# Patient Record
Sex: Male | Born: 1945 | Race: White | Hispanic: No | Marital: Married | Smoking: Former smoker
Health system: Southern US, Community
[De-identification: ages and names within clinical notes are randomized; demographics above are authoritative.]

## PROBLEM LIST (undated history)

## (undated) DIAGNOSIS — D126 Benign neoplasm of colon, unspecified: Secondary | ICD-10-CM

## (undated) DIAGNOSIS — I5022 Chronic systolic (congestive) heart failure: Secondary | ICD-10-CM

## (undated) DIAGNOSIS — B159 Hepatitis A without hepatic coma: Secondary | ICD-10-CM

## (undated) DIAGNOSIS — I472 Ventricular tachycardia, unspecified: Secondary | ICD-10-CM

## (undated) DIAGNOSIS — I714 Abdominal aortic aneurysm, without rupture, unspecified: Secondary | ICD-10-CM

## (undated) DIAGNOSIS — I6522 Occlusion and stenosis of left carotid artery: Secondary | ICD-10-CM

## (undated) DIAGNOSIS — K259 Gastric ulcer, unspecified as acute or chronic, without hemorrhage or perforation: Secondary | ICD-10-CM

## (undated) DIAGNOSIS — I701 Atherosclerosis of renal artery: Secondary | ICD-10-CM

## (undated) DIAGNOSIS — I2510 Atherosclerotic heart disease of native coronary artery without angina pectoris: Secondary | ICD-10-CM

## (undated) DIAGNOSIS — I10 Essential (primary) hypertension: Secondary | ICD-10-CM

## (undated) DIAGNOSIS — I219 Acute myocardial infarction, unspecified: Secondary | ICD-10-CM

## (undated) DIAGNOSIS — E785 Hyperlipidemia, unspecified: Secondary | ICD-10-CM

## (undated) DIAGNOSIS — J4489 Other specified chronic obstructive pulmonary disease: Secondary | ICD-10-CM

## (undated) DIAGNOSIS — J449 Chronic obstructive pulmonary disease, unspecified: Secondary | ICD-10-CM

## (undated) DIAGNOSIS — I509 Heart failure, unspecified: Secondary | ICD-10-CM

## (undated) DIAGNOSIS — I77810 Thoracic aortic ectasia: Secondary | ICD-10-CM

## (undated) HISTORY — DX: Atherosclerotic heart disease of native coronary artery without angina pectoris: I25.10

## (undated) HISTORY — DX: Gastric ulcer, unspecified as acute or chronic, without hemorrhage or perforation: K25.9

## (undated) HISTORY — DX: Occlusion and stenosis of left carotid artery: I65.22

## (undated) HISTORY — DX: Chronic obstructive pulmonary disease, unspecified: J44.9

## (undated) HISTORY — DX: Essential (primary) hypertension: I10

## (undated) HISTORY — DX: Acute myocardial infarction, unspecified: I21.9

## (undated) HISTORY — DX: Hyperlipidemia, unspecified: E78.5

## (undated) HISTORY — DX: Thoracic aortic ectasia: I77.810

## (undated) HISTORY — PX: ABDOMINAL AORTIC ANEURYSM REPAIR: SUR1152

## (undated) HISTORY — DX: Chronic systolic (congestive) heart failure: I50.22

## (undated) HISTORY — PX: ANGIOPLASTY: SHX39

## (undated) HISTORY — DX: Hepatitis a without hepatic coma: B15.9

## (undated) HISTORY — PX: TONSILLECTOMY AND ADENOIDECTOMY: SUR1326

## (undated) HISTORY — DX: Benign neoplasm of colon, unspecified: D12.6

## (undated) HISTORY — DX: Other specified chronic obstructive pulmonary disease: J44.89

## (undated) HISTORY — DX: Abdominal aortic aneurysm, without rupture: I71.4

## (undated) HISTORY — DX: Abdominal aortic aneurysm, without rupture, unspecified: I71.40

---

## 1994-10-05 HISTORY — PX: CORONARY ANGIOPLASTY: SHX604

## 2009-10-05 HISTORY — PX: CARDIAC CATHETERIZATION: SHX172

## 2009-10-14 ENCOUNTER — Ambulatory Visit: Payer: Self-pay | Admitting: Gastroenterology

## 2010-01-30 ENCOUNTER — Ambulatory Visit: Payer: Self-pay | Admitting: Cardiology

## 2012-07-08 DIAGNOSIS — R739 Hyperglycemia, unspecified: Secondary | ICD-10-CM | POA: Insufficient documentation

## 2012-07-09 DIAGNOSIS — I472 Ventricular tachycardia: Secondary | ICD-10-CM | POA: Insufficient documentation

## 2012-07-09 DIAGNOSIS — I4729 Other ventricular tachycardia: Secondary | ICD-10-CM | POA: Insufficient documentation

## 2012-07-09 DIAGNOSIS — I214 Non-ST elevation (NSTEMI) myocardial infarction: Secondary | ICD-10-CM | POA: Insufficient documentation

## 2012-07-14 ENCOUNTER — Encounter: Payer: Self-pay | Admitting: *Deleted

## 2012-07-19 ENCOUNTER — Ambulatory Visit (INDEPENDENT_AMBULATORY_CARE_PROVIDER_SITE_OTHER): Payer: BC Managed Care – PPO | Admitting: Cardiovascular Disease

## 2012-07-19 ENCOUNTER — Encounter: Payer: Self-pay | Admitting: Cardiovascular Disease

## 2012-07-19 VITALS — BP 98/70 | HR 76 | Ht 71.0 in | Wt 268.2 lb

## 2012-07-19 DIAGNOSIS — R0600 Dyspnea, unspecified: Secondary | ICD-10-CM

## 2012-07-19 DIAGNOSIS — I2510 Atherosclerotic heart disease of native coronary artery without angina pectoris: Secondary | ICD-10-CM

## 2012-07-19 DIAGNOSIS — R0789 Other chest pain: Secondary | ICD-10-CM

## 2012-07-19 DIAGNOSIS — I714 Abdominal aortic aneurysm, without rupture, unspecified: Secondary | ICD-10-CM

## 2012-07-19 DIAGNOSIS — R0609 Other forms of dyspnea: Secondary | ICD-10-CM

## 2012-07-19 DIAGNOSIS — E785 Hyperlipidemia, unspecified: Secondary | ICD-10-CM

## 2012-07-19 MED ORDER — CLOPIDOGREL BISULFATE 75 MG PO TABS: 75.0000 mg | ORAL_TABLET | Freq: Every day | ORAL | Status: DC

## 2012-07-19 MED ORDER — SPIRONOLACTONE 25 MG PO TABS: 25.0000 mg | ORAL_TABLET | Freq: Every day | ORAL | Status: DC

## 2012-07-19 MED ORDER — ROSUVASTATIN CALCIUM 5 MG PO TABS: 5.0000 mg | ORAL_TABLET | Freq: Every day | ORAL | Status: DC

## 2012-07-19 NOTE — Patient Instructions (Addendum)
Your physician has requested that you have an echocardiogram. Echocardiography is a painless test that uses sound waves to create images of your heart. It provides your doctor with information about the size and shape of your heart and how well your heart's chambers and valves are working. This procedure takes approximately one hour. There are no restrictions for this procedure.  Start Aldactone 25 mg once daily.  Start Plavix 75 mg once daily.  Start Crestor 5 mg once daily.  Follow up after Echo.  Stay off work until further evaluation.

## 2012-07-25 ENCOUNTER — Encounter: Payer: Self-pay | Admitting: Cardiovascular Disease

## 2012-07-25 ENCOUNTER — Other Ambulatory Visit: Payer: Self-pay

## 2012-07-25 ENCOUNTER — Other Ambulatory Visit (INDEPENDENT_AMBULATORY_CARE_PROVIDER_SITE_OTHER): Payer: BC Managed Care – PPO

## 2012-07-25 DIAGNOSIS — I714 Abdominal aortic aneurysm, without rupture, unspecified: Secondary | ICD-10-CM | POA: Insufficient documentation

## 2012-07-25 DIAGNOSIS — I2510 Atherosclerotic heart disease of native coronary artery without angina pectoris: Secondary | ICD-10-CM | POA: Insufficient documentation

## 2012-07-25 DIAGNOSIS — R0609 Other forms of dyspnea: Secondary | ICD-10-CM

## 2012-07-25 DIAGNOSIS — R0600 Dyspnea, unspecified: Secondary | ICD-10-CM

## 2012-07-25 DIAGNOSIS — E785 Hyperlipidemia, unspecified: Secondary | ICD-10-CM | POA: Insufficient documentation

## 2012-07-25 DIAGNOSIS — R0989 Other specified symptoms and signs involving the circulatory and respiratory systems: Secondary | ICD-10-CM

## 2012-07-25 NOTE — Assessment & Plan Note (Signed)
The patient reports previous intolerance to multiple statins. He is willing to try these medications again given his recent MI. Thus, I will try with small dose Crestor 5 mg once daily.

## 2012-07-25 NOTE — Progress Notes (Signed)
HPI  Garrett Hunter is a pleasant 66 year old male who is here today to establish cardiovascular care. He has known history of coronary artery disease status post inferior myocardial infarction in 1996. He was treated with TPA at that time and was treated at [facility] as well with unknown details. He underwent cardiac catheterization in 2011 by Dr. Lady Gary which showed chronically occluded proximal RCA and proximal left circumflex with good collaterals. There was no obstructive disease in the LAD. He was treated medically. He has prolonged history of hyperlipidemia with intolerance to multiple statins due to myalgia. He was on vacation recently in [place]. One day after his arrival, on October 4, he started having substernal chest pain similar to his prior myocardial infarction. He was hospitalized there with non-ST elevation myocardial infarction. Peak troponin was 48 with significant anterior and anterolateral ST depression without ST elevation. He underwent cardiac catheterization by Dr. Gerri Spore, [place]. I reviewed the report and the images of the catheterization. It showed chronic occlusion in the RCA and left circumflex which was not known to them. There was plaque rupture in the proximal ramus was thrombus formation. An angioplasty was attempted to the left circumflex but as expected was unsuccessful. He then underwent successful balloon angioplasty of the proximal ramus without complications. Ejection fraction was noted to be 30% with akinesis of the basal inferior wall with possible aneurysm formation. The patient did well overall and was discharged home. Since hospital discharge, and reasonably well. He has not had recurrent chest pain or shortness of breath. I spent at least 40 minutes reviewing his previous cardiac records as well as cardiac catheterization.  Allergies  Allergen Reactions  . Crestor (Rosuvastatin)   . Livalo (Pitavastatin)     myalgia  . Pravachol (Pravastatin Sodium)   . Zocor  (Simvastatin)      Current Outpatient Prescriptions on File Prior to Visit  Medication Sig Dispense Refill  . aspirin 81 MG tablet Take 81 mg by mouth daily.      . carvedilol (COREG) 12.5 MG tablet Take 12.5 mg by mouth 2 (two) times daily.      . folic acid (FOLVITE) 400 MCG tablet Takes 2 tablets daily.      . rosuvastatin (CRESTOR) 5 MG tablet Take 1 tablet (5 mg total) by mouth daily.  90 tablet  3  . spironolactone (ALDACTONE) 25 MG tablet Take 1 tablet (25 mg total) by mouth daily.  30 tablet  6     Past Medical History  Diagnosis Date  . MI (myocardial infarction)   . Hypertension   . Hepatitis A   . Chronic airway obstruction, not elsewhere classified   . Benign neoplasm of colon   . Coronary artery disease     Inferior MI in 1996 treated with TPA. Cardiac cath in 2011 at [facility] showed chronic occlusion of the proximal RCA and proximal left circumflex without obstructive disease in the LAD. Non-ST elevation myocardial infarction in October 2013 while on vacation in [place]. Cardiac catheterization showed plaque rupture in the proximal ramus with thrombus. He had balloon angioplasty done.   . Coronary atherosclerosis of native coronary artery   . Hyperlipidemia     Intolerance to statins.  [name] AAA (abdominal aortic aneurysm)     Small noted during cardiac catheterization in 2011.     Past Surgical History  Procedure Date  . Tonsillectomy and adenoidectomy   . Angioplasty   . Cardiac catheterization 2011  . Coronary angioplasty 1996  [facility] x1     Family History  Problem Relation Age of Onset  . Heart attack Father      History   Social History  . Marital Status: Married    Spouse Name: N/A    Number of Children: N/A  . Years of Education: N/A   Occupational History  . Not on file.   Social History Main Topics  . Smoking status: Former Smoker -- 2.0 packs/day for 40 years    Types: Cigarettes  . Smokeless tobacco: Not on file  . Alcohol Use: Yes      occassionally  . Drug Use: No  . Sexually Active:    Other Topics Concern  . Not on file   Social History Narrative  . No narrative on file     ROS Constitutional: Negative for fever, chills, diaphoresis, activity change, appetite change and fatigue.  HENT: Negative for hearing loss, nosebleeds, congestion, sore throat, facial swelling, drooling, trouble swallowing, neck pain, voice change, sinus pressure and tinnitus.  Eyes: Negative for photophobia, pain, discharge and visual disturbance.  Respiratory: Negative for apnea, cough, chest tightness, shortness of breath and wheezing.  Cardiovascular: Negative for chest pain, palpitations and leg swelling.  Gastrointestinal: Negative for nausea, vomiting, abdominal pain, diarrhea, constipation, blood in stool and abdominal distention.  Genitourinary: Negative for dysuria, urgency, frequency, hematuria and decreased urine volume.  Musculoskeletal: Negative for myalgias, back pain, joint swelling, arthralgias and gait problem.  Skin: Negative for color change, pallor, rash and wound.  Neurological: Negative for dizziness, tremors, seizures, syncope, speech difficulty, weakness, light-headedness, numbness and headaches.  Psychiatric/Behavioral: Negative for suicidal ideas, hallucinations, behavioral problems and agitation. The patient is not nervous/anxious.     PHYSICAL EXAM   BP 98/70  Pulse 76  Ht 5\' 11"  (1.803 m)  Wt 268 lb 4 oz (121.677 kg)  BMI 37.41 kg/m2 Constitutional: He is oriented to person, place, and time. He appears well-developed and well-nourished. No distress.  HENT: No nasal discharge.  Head: Normocephalic and atraumatic.  Eyes: Pupils are equal and round. Right eye exhibits no discharge. Left eye exhibits no discharge.  Neck: Normal range of motion. Neck supple. No JVD present. No thyromegaly present.  Cardiovascular: Normal rate, regular rhythm, normal heart sounds and. Exam reveals no gallop and no friction rub.  No murmur heard.  Pulmonary/Chest: Effort normal and breath sounds normal. No stridor. No respiratory distress. He has no wheezes. He has no rales. He exhibits no tenderness.  Abdominal: Soft. Bowel sounds are normal. He exhibits no distension. There is no tenderness. There is no rebound and no guarding.  Musculoskeletal: Normal range of motion. He exhibits no edema and no tenderness.  Neurological: He is alert and oriented to person, place, and time. Coordination normal.  Skin: Skin is warm and dry. No rash noted. He is not diaphoretic. No erythema. No pallor.  Psychiatric: He has a normal mood and affect. His behavior is normal. Judgment and thought content normal.       EKG: Sinus  Rhythm  -Nonspecific QRS widening.   -  T-abnormality  -Possible lateral ischemia.   ABNORMAL    ASSESSMENT AND PLAN

## 2012-07-25 NOTE — Assessment & Plan Note (Signed)
This was mentioned on cardiac catheterization in 2011. He will need a followup abdominal duplex ultrasound.

## 2012-07-25 NOTE — Assessment & Plan Note (Signed)
The patient is doing reasonably well after her recent non-ST elevation myocardial infarction with angioplasty of the proximal ramus without stent placement. He is known to have chronic occlusion in the RCA as well as left circumflex. I reviewed his recent cardiac catheterization. There is no obstructive disease in the left anterior descending artery. The inferior wall segment is likely nonviable. He has reasonable collaterals to the left circumflex and RCA distribution and these have been known to be occluded since at least 2011. Thus, is likely not much benefit of revascularization in these territories. I Am also interested to see what his current ejection fraction is. Thus, I will request an echocardiogram. I asked him to start taking Plavix 75 mg once daily and this was prescribed to him. Also due to his reduced ejection fraction and recent myocardial infarction, I agree with spironolactone 25 mg once daily. I will check basic metabolic profile during his followup visit.

## 2012-07-26 ENCOUNTER — Other Ambulatory Visit: Payer: Self-pay

## 2012-07-26 DIAGNOSIS — I714 Abdominal aortic aneurysm, without rupture, unspecified: Secondary | ICD-10-CM

## 2012-07-26 MED ORDER — ROSUVASTATIN CALCIUM 5 MG PO TABS: 5.0000 mg | ORAL_TABLET | Freq: Every day | ORAL | Status: DC

## 2012-07-28 ENCOUNTER — Telehealth: Payer: Self-pay

## 2012-07-28 ENCOUNTER — Encounter (INDEPENDENT_AMBULATORY_CARE_PROVIDER_SITE_OTHER): Payer: BC Managed Care – PPO

## 2012-07-28 DIAGNOSIS — I714 Abdominal aortic aneurysm, without rupture, unspecified: Secondary | ICD-10-CM

## 2012-07-28 NOTE — Telephone Encounter (Signed)
Rec'd t/c from pharmacy stating insur company is denying Crestor 5 mg, wants MD to first try generic statin. I told pharmacist we would supply pt with samples of Crestor 5 until Dr. Kirke Corin tells [place] what to prescribe. Understanding verb. Pt will pick up samples tomm.

## 2012-07-29 NOTE — Telephone Encounter (Signed)
He already tried multiple generics and had too many side effects. I can discuss this with him during follow up.

## 2012-08-01 NOTE — Telephone Encounter (Signed)
Has f/u 10/31

## 2012-08-03 ENCOUNTER — Encounter: Payer: Self-pay | Admitting: Cardiovascular Disease

## 2012-08-04 ENCOUNTER — Telehealth: Payer: Self-pay

## 2012-08-04 ENCOUNTER — Encounter: Payer: Self-pay | Admitting: Cardiovascular Disease

## 2012-08-04 ENCOUNTER — Ambulatory Visit (INDEPENDENT_AMBULATORY_CARE_PROVIDER_SITE_OTHER): Payer: BC Managed Care – PPO | Admitting: Cardiovascular Disease

## 2012-08-04 VITALS — BP 102/70 | HR 80 | Ht 71.0 in | Wt 219.0 lb

## 2012-08-04 DIAGNOSIS — I5022 Chronic systolic (congestive) heart failure: Secondary | ICD-10-CM | POA: Insufficient documentation

## 2012-08-04 DIAGNOSIS — I2510 Atherosclerotic heart disease of native coronary artery without angina pectoris: Secondary | ICD-10-CM

## 2012-08-04 DIAGNOSIS — I714 Abdominal aortic aneurysm, without rupture, unspecified: Secondary | ICD-10-CM

## 2012-08-04 DIAGNOSIS — E785 Hyperlipidemia, unspecified: Secondary | ICD-10-CM

## 2012-08-04 MED ORDER — ROSUVASTATIN CALCIUM 5 MG PO TABS: 5.0000 mg | ORAL_TABLET | Freq: Every day | ORAL | Status: DC

## 2012-08-04 NOTE — Progress Notes (Signed)
HPI  [name] is a pleasant 66 year old male who is here today for a followup visit.  He has known history of coronary artery disease status post inferior myocardial infarction in 1996. He was treated with TPA at that time and was treated at [facility] as well with unknown details. He underwent cardiac catheterization in 2011 by Dr. Lady Hunter which showed chronically occluded proximal RCA and proximal left circumflex with good collaterals. There was no obstructive disease in the LAD. He was treated medically. He has prolonged history of hyperlipidemia with intolerance to multiple statins due to myalgia. He was on vacation recently in [place]. One day after his arrival, on October 4, he started having substernal chest pain similar to his prior myocardial infarction. He was hospitalized there with non-ST elevation myocardial infarction. Peak troponin was 48 with significant anterior and anterolateral ST depression without ST elevation. He underwent cardiac catheterization by Dr. Gerri Hunter, [place]. I reviewed the report and the images of the catheterization. It showed chronic occlusion in the RCA and left circumflex which was not known to them. There was plaque rupture in the proximal ramus was thrombus formation. An angioplasty was attempted to the left circumflex but as expected was unsuccessful. He then underwent successful balloon angioplasty of the proximal ramus without complications. Ejection fraction was noted to be 30% with akinesis of the basal inferior wall with possible aneurysm formation. The patient did well overall and was discharged home. Since hospital discharge, and reasonably well. He has not had recurrent chest pain or shortness of breath. During last visit, I started him on Plavix 75 mg once daily as well as spironolactone 25 mg once daily. He underwent an echocardiogram which showed an ejection fraction of 40-45%. 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 Overall, he is doing very well at this time. He denies any chest pain or dyspnea. He complains of occasional dizziness especially when he stands up.  Allergies  Allergen Reactions  . Crestor (Rosuvastatin)   . Livalo (Pitavastatin)     myalgia  . Pravachol (Pravastatin Sodium)   . Zocor (Simvastatin)      Current Outpatient Prescriptions on File Prior to Visit  Medication Sig Dispense Refill  . aspirin 81 MG tablet Take 81 mg by mouth daily.      . carvedilol (COREG) 12.5 MG tablet Take 12.5 mg by mouth 2 (two) times daily.      . clopidogrel (PLAVIX) 75 MG tablet Take 1 tablet (75 mg total) by mouth daily.  30 tablet  6  . folic acid (FOLVITE) 400 MCG tablet Takes 2 tablets daily.      [name] lisinopril (PRINIVIL,ZESTRIL) 10 MG tablet Take 10 mg by mouth daily.      . rosuvastatin (CRESTOR) 5 MG tablet Take 1 tablet (5 mg total) by mouth daily.  90 tablet  3  . spironolactone (ALDACTONE) 25 MG tablet Take 1 tablet (25 mg total) by mouth daily.  30 tablet  6     Past Medical History  Diagnosis Date  . MI (myocardial infarction)   . Hypertension   . Hepatitis A   . Chronic airway obstruction, not elsewhere classified   . Benign neoplasm of colon   . Coronary artery disease     Inferior MI in 1996 treated with TPA. Cardiac cath in 2011 at [facility] showed chronic occlusion of the proximal RCA and  proximal left circumflex without obstructive disease in the LAD. Non-ST elevation myocardial infarction in October 2013 while on vacation in [place]. Cardiac catheterization showed plaque rupture in the proximal ramus with thrombus. He had balloon angioplasty done.   . Coronary atherosclerosis of native coronary artery   . Hyperlipidemia     Intolerance to statins.  [name] AAA (abdominal aortic aneurysm)     Small noted during cardiac catheterization in 2011.     Past Surgical History  Procedure Date  . Tonsillectomy and  adenoidectomy   . Angioplasty   . Cardiac catheterization 2011  . Coronary angioplasty 1996    [facility] x1     Family History  Problem Relation Age of Onset  . Heart attack Father      History   Social History  . Marital Status: Married    Spouse Name: N/A    Number of Children: N/A  . Years of Education: N/A   Occupational History  . Not on file.   Social History Main Topics  . Smoking status: Former Smoker -- 2.0 packs/day for 40 years    Types: Cigarettes  . Smokeless tobacco: Not on file  . Alcohol Use: Yes     occassionally  . Drug Use: No  . Sexually Active:    Other Topics Concern  . Not on file   Social History Narrative  . No narrative on file      PHYSICAL EXAM   BP 102/70  Pulse 80  Ht 5\' 11"  (1.803 m)  Wt 219 lb (99.338 kg)  BMI 30.54 kg/m2 Constitutional: He is oriented to person, place, and time. He appears well-developed and well-nourished. No distress.  HENT: No nasal discharge.  Head: Normocephalic and atraumatic.  Eyes: Pupils are equal and round. Right eye exhibits no discharge. Left eye exhibits no discharge.  Neck: Normal range of motion. Neck supple. No JVD present. No thyromegaly present.  Cardiovascular: Normal rate, regular rhythm, normal heart sounds and. Exam reveals no gallop and no friction rub. No murmur heard.  Pulmonary/Chest: Effort normal and breath sounds normal. No stridor. No respiratory distress. He has no wheezes. He has no rales. He exhibits no tenderness.  Abdominal: Soft. Bowel sounds are normal. He exhibits no distension. There is no tenderness. There is no rebound and no guarding.  Musculoskeletal: Normal range of motion. He exhibits no edema and no tenderness.  Neurological: He is alert and oriented to person, place, and time. Coordination normal.  Skin: Skin is warm and dry. No rash noted. He is not diaphoretic. No erythema. No pallor.  Psychiatric: He has a normal mood and affect. His behavior is normal. Judgment  and thought content normal.        ASSESSMENT AND PLAN

## 2012-08-04 NOTE — Assessment & Plan Note (Addendum)
The patient seems to be doing well at this time with no recurrent angina. He is known to have chronically occluded RCA and left circumflex. Ejection fraction was 40-45% by echocardiogram. The inferior wall was akinetic and likely nonviable. Given that he does not have obstructive disease in the LAD and likely nonviable inferior wall, the benefit of surgical revascularization is marginal at best. Thus, I recommend continuing aggressive medical therapy. He is to continue Plavix for at least one year but I will likely use this long-term if tolerated. He can resume work on November 5 without restrictions. I will also refer him to cardiac rehabilitation.

## 2012-08-04 NOTE — Assessment & Plan Note (Signed)
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 Currently New York Heart Association class II with no evidence of fluid overload. Continue medical therapy. Check basic metabolic profile today given that he was started on spironolactone recently. If he continues to complain of dizziness, I will consider decreasing the dose of Coreg or lisinopril.

## 2012-08-04 NOTE — Telephone Encounter (Signed)
Refill sent for crestor 5 mg take one tablet daily. The patient was notified that the crestor has been approved for 24 months starting 08/04/2012 per Ismael @ [phone].

## 2012-08-04 NOTE — Assessment & Plan Note (Signed)
Medium in size at 4.6 cm. I will plan on repeat aortic duplex ultrasound in 6 months to ensure stability in size.

## 2012-08-04 NOTE — Assessment & Plan Note (Signed)
The patient has known history of intolerance to multiple statins due to myalgia. He is currently tolerating Crestor 5 mg once daily. I will check fasting lipid and liver profile in 6 weeks.

## 2012-08-04 NOTE — Patient Instructions (Addendum)
Labs today (BMP) Continue same medications.  You can resume work without restrictions on 08/09/2012 Refer to cardiac rehab.  Obtain prior authorization for Crestor.  Fasting labs in 6 weeks.  Follow up in 3 months.

## 2012-08-05 ENCOUNTER — Encounter: Payer: Self-pay | Admitting: Cardiovascular Disease

## 2012-08-05 LAB — BASIC METABOLIC PANEL
BUN/Creatinine Ratio: 15 (ref 10–22)
BUN: 14 mg/dL (ref 8–27)
CO2: 24 mmol/L (ref 19–28)
Calcium: 10 mg/dL (ref 8.6–10.2)
Chloride: 100 mmol/L (ref 97–108)
Creatinine, Ser: 0.95 mg/dL (ref 0.76–1.27)
GFR calc Af Amer: 97 mL/min/{1.73_m2} (ref >59)
GFR calc non Af Amer: 84 mL/min/{1.73_m2} (ref >59)
Glucose: 73 mg/dL (ref 65–99)
Potassium: 5.1 mmol/L (ref 3.5–5.2)
Sodium: 141 mmol/L (ref 134–144)

## 2012-08-10 ENCOUNTER — Encounter: Payer: Self-pay | Admitting: Cardiovascular Disease

## 2012-09-13 ENCOUNTER — Ambulatory Visit (INDEPENDENT_AMBULATORY_CARE_PROVIDER_SITE_OTHER): Payer: BC Managed Care – PPO

## 2012-09-13 DIAGNOSIS — E785 Hyperlipidemia, unspecified: Secondary | ICD-10-CM

## 2012-09-14 LAB — HEPATIC FUNCTION PANEL
ALT: 26 IU/L (ref 0–44)
AST: 24 IU/L (ref 0–40)
Albumin: 4.5 g/dL (ref 3.6–4.8)
Alkaline Phosphatase: 83 IU/L (ref 39–117)
Bilirubin, Direct: 0.11 mg/dL (ref 0.00–0.40)
Total Bilirubin: 0.3 mg/dL (ref 0.0–1.2)
Total Protein: 6.4 g/dL (ref 6.0–8.5)

## 2012-09-14 LAB — LIPID PANEL
Chol/HDL Ratio: 3.3 ratio units (ref 0.0–5.0)
Cholesterol, Total: 121 mg/dL (ref 100–199)
HDL: 37 mg/dL — ABNORMAL LOW (ref >39)
LDL Calculated: 68 mg/dL (ref 0–99)
Triglycerides: 78 mg/dL (ref 0–149)
VLDL Cholesterol Cal: 16 mg/dL (ref 5–40)

## 2012-11-05 HISTORY — PX: CORONARY ANGIOPLASTY: SHX604

## 2012-11-05 HISTORY — PX: CARDIAC CATHETERIZATION: SHX172

## 2012-11-07 ENCOUNTER — Encounter: Payer: Self-pay | Admitting: Cardiovascular Disease

## 2012-11-07 ENCOUNTER — Ambulatory Visit (INDEPENDENT_AMBULATORY_CARE_PROVIDER_SITE_OTHER): Payer: Commercial Indemnity | Admitting: Cardiovascular Disease

## 2012-11-07 VITALS — BP 110/72 | HR 73 | Ht 71.0 in | Wt 225.8 lb

## 2012-11-07 DIAGNOSIS — I5022 Chronic systolic (congestive) heart failure: Secondary | ICD-10-CM

## 2012-11-07 DIAGNOSIS — R0789 Other chest pain: Secondary | ICD-10-CM

## 2012-11-07 DIAGNOSIS — I2510 Atherosclerotic heart disease of native coronary artery without angina pectoris: Secondary | ICD-10-CM

## 2012-11-07 DIAGNOSIS — I714 Abdominal aortic aneurysm, without rupture, unspecified: Secondary | ICD-10-CM

## 2012-11-07 DIAGNOSIS — E785 Hyperlipidemia, unspecified: Secondary | ICD-10-CM

## 2012-11-07 MED ORDER — NITROGLYCERIN 0.4 MG SL SUBL: 0.4000 mg | SUBLINGUAL_TABLET | SUBLINGUAL | Status: DC | PRN

## 2012-11-07 NOTE — Progress Notes (Signed)
HPI  [name] is a pleasant 67 year old male who is here today for a followup visit.  He has known history of coronary artery disease status post inferior myocardial infarction in 1996. He was treated with TPA at that time . He underwent cardiac catheterization in 2011 by Dr. Lady Gary which showed chronically occluded proximal RCA and proximal left circumflex with good collaterals. There was no obstructive disease in the LAD.  He was on vacation in [place] last October and was hospitalized there with non-ST elevation myocardial infarction. Peak troponin was 48 with significant anterior and anterolateral ST depression without ST elevation. He underwent cardiac catheterization by Dr. Gerri Spore, [place]. I reviewed the report and the images of the catheterization. It showed chronic occlusion in the RCA and left circumflex which was not known to them. There was plaque rupture in the proximal ramus was thrombus formation. An angioplasty was attempted to the left circumflex but as expected was unsuccessful. He then underwent successful balloon angioplasty of the proximal ramus without complications. Ejection fraction was noted to be 30% with akinesis of the basal inferior wall with possible aneurysm formation.  I started him on Plavix 75 mg once daily as well as spironolactone 25 mg once daily. He underwent an echocardiogram which showed an ejection fraction of 40-45%. 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 He did have a few recent episodes of substernal discomfort with overexertion. He felt tired.  Allergies  Allergen Reactions  . Crestor (Rosuvastatin)   . Livalo (Pitavastatin)     myalgia  . Pravachol (Pravastatin Sodium)   . Zocor (Simvastatin)      Current Outpatient Prescriptions on File Prior to Visit  Medication Sig Dispense Refill  . aspirin 81 MG tablet Take 81 mg by  mouth daily.      . carvedilol (COREG) 12.5 MG tablet Take 12.5 mg by mouth 2 (two) times daily.      . clopidogrel (PLAVIX) 75 MG tablet Take 1 tablet (75 mg total) by mouth daily.  30 tablet  6  . folic acid (FOLVITE) 400 MCG tablet Takes 2 tablets daily.      [name] lisinopril (PRINIVIL,ZESTRIL) 10 MG tablet Take 10 mg by mouth daily.      . Multiple Vitamin (MULTIVITAMIN) tablet Take 1 tablet by mouth daily.      . rosuvastatin (CRESTOR) 5 MG tablet Take 1 tablet (5 mg total) by mouth daily.  90 tablet  3  . spironolactone (ALDACTONE) 25 MG tablet Take 1 tablet (25 mg total) by mouth daily.  30 tablet  6  . nitroGLYCERIN (NITROSTAT) 0.4 MG SL tablet Place 1 tablet (0.4 mg total) under the tongue every 5 (five) minutes as needed for chest pain.  25 tablet  6     Past Medical History  Diagnosis Date  . MI (myocardial infarction)   . Hypertension   . Hepatitis A   . Chronic airway obstruction, not elsewhere classified   . Benign neoplasm of colon   . Coronary artery disease     Inferior MI in 1996 treated with TPA. Cardiac cath in 2011 at [facility] showed chronic occlusion of the proximal RCA and proximal left circumflex without obstructive disease in the LAD. Non-ST elevation myocardial infarction in October 2013 while on vacation in [place]. Cardiac catheterization showed plaque rupture in the proximal ramus with thrombus. He had balloon angioplasty  done.   . Coronary atherosclerosis of native coronary artery   . Hyperlipidemia     Intolerance to statins.  [name] AAA (abdominal aortic aneurysm)     Small noted during cardiac catheterization in 2011.     Past Surgical History  Procedure Date  . Tonsillectomy and adenoidectomy   . Angioplasty   . Cardiac catheterization 2011  . Coronary angioplasty 1996    [facility] x1     Family History  Problem Relation Age of Onset  . Heart attack Father      History   Social History  . Marital Status: Married    Spouse Name: N/A    Number of  Children: N/A  . Years of Education: N/A   Occupational History  . Not on file.   Social History Main Topics  . Smoking status: Former Smoker -- 2.0 packs/day for 40 years    Types: Cigarettes  . Smokeless tobacco: Not on file  . Alcohol Use: Yes     Comment: occassionally  . Drug Use: No  . Sexually Active:    Other Topics Concern  . Not on file   Social History Narrative  . No narrative on file      PHYSICAL EXAM   BP 110/72  Pulse 73  Ht 5\' 11"  (1.803 m)  Wt 225 lb 12 oz (102.4 kg)  BMI 31.49 kg/m2 Constitutional: He is oriented to person, place, and time. He appears well-developed and well-nourished. No distress.  HENT: No nasal discharge.  Head: Normocephalic and atraumatic.  Eyes: Pupils are equal and round. Right eye exhibits no discharge. Left eye exhibits no discharge.  Neck: Normal range of motion. Neck supple. No JVD present. No thyromegaly present.  Cardiovascular: Normal rate, regular rhythm, normal heart sounds and. Exam reveals no gallop and no friction rub. No murmur heard.  Pulmonary/Chest: Effort normal and breath sounds normal. No stridor. No respiratory distress. He has no wheezes. He has no rales. He exhibits no tenderness.  Abdominal: Soft. Bowel sounds are normal. He exhibits no distension. There is no tenderness. There is no rebound and no guarding.  Musculoskeletal: Normal range of motion. He exhibits no edema and no tenderness.  Neurological: He is alert and oriented to person, place, and time. Coordination normal.  Skin: Skin is warm and dry. No rash noted. He is not diaphoretic. No erythema. No pallor.  Psychiatric: He has a normal mood and affect. His behavior is normal. Judgment and thought content normal.     [id number] sinus rhythm Nonspecific IVCD Nonspecific ST changes in the inferior leads. Lateral T wave changes suggestive of ischemia.   ASSESSMENT AND PLAN

## 2012-11-07 NOTE — Assessment & Plan Note (Signed)
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 Currently New York Heart Association class II with no evidence of fluid overload. Continue medical therapy. Continue current medications.

## 2012-11-07 NOTE — Assessment & Plan Note (Signed)
He had recent dyspnea chest pain with overexertion. He is obviously at high risk for restenosis at the site of recent angioplasty given that no stent was placed. Thus, I recommend proceeding with a treadmill nuclear stress test for evaluation. A treadmill stress test alone is not sufficient given his abnormal baseline ECG.

## 2012-11-07 NOTE — Assessment & Plan Note (Signed)
I ordered a followup aortic duplex ultrasound to be done in May of this year.

## 2012-11-07 NOTE — Patient Instructions (Addendum)
Your physician has requested that you have en exercise stress myoview. For further information please visit [email or web address] Please follow instruction sheet, as given.  Hold Coreg the morning of stress test.   Schedule aortic aneurysm Duplex in May.   Follow up in 6 months.

## 2012-11-07 NOTE — Assessment & Plan Note (Signed)
Lab Results  Component Value Date   HDL 37* 09/13/2012   LDLCALC 68 09/13/2012   TRIG 78 09/13/2012   CHOLHDL 3.3 09/13/2012   Continue small dose Crestor which seems to be well tolerated.

## 2012-11-15 ENCOUNTER — Telehealth: Payer: Self-pay

## 2012-11-15 NOTE — Telephone Encounter (Signed)
lmtcb

## 2012-11-15 NOTE — Telephone Encounter (Signed)
"  Call pt to move stress test to next week d/t weather" VO Dr. Alvis Lemmings, RN

## 2012-11-16 NOTE — Telephone Encounter (Signed)
Verbal instructions given to pt re:new date and time for stress myoview R/s to 2/17 Understanding verb

## 2012-11-19 ENCOUNTER — Other Ambulatory Visit: Payer: Self-pay

## 2012-11-21 ENCOUNTER — Ambulatory Visit: Payer: Self-pay | Admitting: Cardiovascular Disease

## 2012-11-21 ENCOUNTER — Telehealth: Payer: Self-pay

## 2012-11-21 ENCOUNTER — Other Ambulatory Visit: Payer: Self-pay

## 2012-11-21 ENCOUNTER — Ambulatory Visit (INDEPENDENT_AMBULATORY_CARE_PROVIDER_SITE_OTHER): Payer: Commercial Indemnity

## 2012-11-21 DIAGNOSIS — R0609 Other forms of dyspnea: Secondary | ICD-10-CM

## 2012-11-21 DIAGNOSIS — R0600 Dyspnea, unspecified: Secondary | ICD-10-CM

## 2012-11-21 DIAGNOSIS — R079 Chest pain, unspecified: Secondary | ICD-10-CM

## 2012-11-21 NOTE — Telephone Encounter (Signed)
I spoke with wife who asks that I call husband on home phone and if I cannot speak with him, I can call her back with details

## 2012-11-21 NOTE — Telephone Encounter (Signed)
Will call pt to schedule

## 2012-11-21 NOTE — Telephone Encounter (Signed)
Verbal instructions given to pt's wife re: LHC and need for labs Understanding verb She will see if he can come in today for labs

## 2012-11-21 NOTE — Telephone Encounter (Signed)
Scheduled LHC for Thursday 11/24/12 at 0900 Will call pt with details

## 2012-11-21 NOTE — Telephone Encounter (Signed)
LMTCB on home phone.

## 2012-11-21 NOTE — Telephone Encounter (Signed)
Message copied by [facility], Kamauri Denardo E on Mon Nov 21, 2012  3:03 PM ------      Message from: Lorine Bears A      Created: Mon Nov 21, 2012  2:50 PM       His stress test was highly abnormal. I left him a message in that regard. Schedule him for cardiac cath at [facility] this week (Thursday is fine).  ------

## 2012-11-22 ENCOUNTER — Other Ambulatory Visit: Payer: Self-pay | Admitting: *Deleted

## 2012-11-22 DIAGNOSIS — R0600 Dyspnea, unspecified: Secondary | ICD-10-CM

## 2012-11-22 DIAGNOSIS — R0609 Other forms of dyspnea: Secondary | ICD-10-CM

## 2012-11-22 LAB — CBC WITH DIFFERENTIAL
Basophils Absolute: 0 10*3/uL (ref 0.0–0.2)
Basos: 0 % (ref 0–3)
Eos: 3 % (ref 0–5)
Eosinophils Absolute: 0.2 10*3/uL (ref 0.0–0.4)
HCT: 40.7 % (ref 37.5–51.0)
Hemoglobin: 13.7 g/dL (ref 12.6–17.7)
Immature Grans (Abs): 0 10*3/uL (ref 0.0–0.1)
Immature Granulocytes: 0 % (ref 0–2)
Lymphocytes Absolute: 2.2 10*3/uL (ref 0.7–3.1)
Lymphs: 26 % (ref 14–46)
MCH: 32.8 pg (ref 26.6–33.0)
MCHC: 33.7 g/dL (ref 31.5–35.7)
MCV: 97 fL (ref 79–97)
Monocytes Absolute: 0.6 10*3/uL (ref 0.1–0.9)
Monocytes: 7 % (ref 4–12)
Neutrophils Absolute: 5.5 10*3/uL (ref 1.4–7.0)
Neutrophils Relative %: 64 % (ref 40–74)
Platelets: 262 10*3/uL (ref 155–379)
RBC: 4.18 x10E6/uL (ref 4.14–5.80)
RDW: 13.3 % (ref 12.3–15.4)
WBC: 8.5 10*3/uL (ref 3.4–10.8)

## 2012-11-22 LAB — BASIC METABOLIC PANEL
BUN/Creatinine Ratio: 18 (ref 10–22)
BUN: 17 mg/dL (ref 8–27)
CO2: 17 mmol/L — ABNORMAL LOW (ref 19–28)
Calcium: 8.8 mg/dL (ref 8.6–10.2)
Chloride: 103 mmol/L (ref 97–108)
Creatinine, Ser: 0.96 mg/dL (ref 0.76–1.27)
GFR calc Af Amer: 96 mL/min/{1.73_m2} (ref >59)
GFR calc non Af Amer: 83 mL/min/{1.73_m2} (ref >59)
Glucose: 120 mg/dL — ABNORMAL HIGH (ref 65–99)
Potassium: 4.6 mmol/L (ref 3.5–5.2)
Sodium: 143 mmol/L (ref 134–144)

## 2012-11-22 LAB — PROTIME-INR
INR: 1 (ref 0.8–1.2)
Prothrombin Time: 10.3 s (ref 9.1–12.0)

## 2012-11-23 ENCOUNTER — Other Ambulatory Visit: Payer: Self-pay | Admitting: *Deleted

## 2012-11-23 DIAGNOSIS — R0789 Other chest pain: Secondary | ICD-10-CM

## 2012-11-24 ENCOUNTER — Encounter: Payer: Self-pay | Admitting: Cardiovascular Disease

## 2012-11-24 ENCOUNTER — Ambulatory Visit: Payer: Self-pay | Admitting: Cardiovascular Disease

## 2012-11-24 DIAGNOSIS — I2510 Atherosclerotic heart disease of native coronary artery without angina pectoris: Secondary | ICD-10-CM

## 2012-11-24 LAB — CK TOTAL AND CKMB (NOT AT ARMC)
CK, Total: 99 U/L (ref 35–232)
CK-MB: 1 ng/mL (ref 0.5–3.6)

## 2012-11-25 DIAGNOSIS — I209 Angina pectoris, unspecified: Secondary | ICD-10-CM

## 2012-11-25 DIAGNOSIS — R9430 Abnormal result of cardiovascular function study, unspecified: Secondary | ICD-10-CM

## 2012-11-25 LAB — BASIC METABOLIC PANEL
Anion Gap: 6 — ABNORMAL LOW (ref 7–16)
BUN: 13 mg/dL (ref 7–18)
Calcium, Total: 9.1 mg/dL (ref 8.5–10.1)
Chloride: 106 mmol/L (ref 98–107)
Co2: 29 mmol/L (ref 21–32)
Creatinine: 0.92 mg/dL (ref 0.60–1.30)
EGFR (African American): >60
EGFR (Non-African Amer.): >60
Glucose: 99 mg/dL (ref 65–99)
Osmolality: 281 (ref 275–301)
Potassium: 4.6 mmol/L (ref 3.5–5.1)
Sodium: 141 mmol/L (ref 136–145)

## 2012-11-28 ENCOUNTER — Telehealth: Payer: Self-pay | Admitting: *Deleted

## 2012-11-28 ENCOUNTER — Encounter: Payer: Self-pay | Admitting: Cardiovascular Disease

## 2012-11-28 NOTE — Telephone Encounter (Signed)
Error

## 2012-12-05 ENCOUNTER — Encounter: Payer: Self-pay | Admitting: *Deleted

## 2012-12-12 ENCOUNTER — Ambulatory Visit (INDEPENDENT_AMBULATORY_CARE_PROVIDER_SITE_OTHER): Payer: Commercial Indemnity | Admitting: Cardiovascular Disease

## 2012-12-12 ENCOUNTER — Encounter: Payer: Self-pay | Admitting: Cardiovascular Disease

## 2012-12-12 VITALS — BP 110/60 | HR 82 | Ht 71.0 in | Wt 226.2 lb

## 2012-12-12 DIAGNOSIS — I714 Abdominal aortic aneurysm, without rupture, unspecified: Secondary | ICD-10-CM

## 2012-12-12 DIAGNOSIS — I5022 Chronic systolic (congestive) heart failure: Secondary | ICD-10-CM

## 2012-12-12 DIAGNOSIS — I2510 Atherosclerotic heart disease of native coronary artery without angina pectoris: Secondary | ICD-10-CM

## 2012-12-12 DIAGNOSIS — E785 Hyperlipidemia, unspecified: Secondary | ICD-10-CM

## 2012-12-12 MED ORDER — ASPIRIN EC 81 MG PO TBEC: 81.0000 mg | DELAYED_RELEASE_TABLET | Freq: Every day | ORAL | Status: DC

## 2012-12-12 NOTE — Patient Instructions (Addendum)
Add Aspirin 81 mg once daily.  Continue other medications.  Attend cardiac rehab.  Follow up in 6 months.

## 2012-12-12 NOTE — Assessment & Plan Note (Signed)
He is tolerating small dose Crestor. 

## 2012-12-12 NOTE — Assessment & Plan Note (Signed)
Followup the aortic duplex ultrasound is scheduled in May.

## 2012-12-12 NOTE — Assessment & Plan Note (Signed)
He appears to be euvolemic and overall in New York Heart Association class II. Continue current medications.

## 2012-12-12 NOTE — Progress Notes (Signed)
HPI  [name] is a pleasant 67 year old male who is here today for a followup visit.  He has known history of coronary artery disease status post inferior myocardial infarction in 1996. He was treated with TPA at that time . He underwent cardiac catheterization in 2011 by Dr. Lady Gary which showed chronically occluded proximal RCA and proximal left circumflex with good collaterals. There was no obstructive disease in the LAD.  He was on vacation in [place] last October and was hospitalized there with non-ST elevation myocardial infarction. Peak troponin was 48 with significant anterior and anterolateral ST depression without ST elevation. He underwent cardiac catheterization by Dr. Gerri Spore, [place] which showed chronic occlusion in the RCA and left circumflex . There was plaque rupture in the proximal ramus was thrombus formation. An angioplasty was attempted to the left circumflex but as expected was unsuccessful. He then underwent successful balloon angioplasty of the proximal ramus without complications. Ejection fraction was noted to be 30% with akinesis of the basal inferior wall with possible aneurysm formation.  I started him on Plavix 75 mg once daily as well as spironolactone 25 mg once daily. He underwent an echocardiogram which showed an ejection fraction of 40-45%. 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 He was seen recently for episodes of exertional substernal chest pain and dyspnea. He underwent a nuclear stress test which showed lateral ischemia in the distribution of the ramus artery. I performed cardiac catheterization which showed severe restenosis in the ostial ramus at the site of recent angioplasty done in [place]. I performed angioplasty and drug-eluting stent placement without complications. He reports resolution of chest pain and feels overall  better.  Allergies  Allergen Reactions  . Crestor (Rosuvastatin)   . Livalo (Pitavastatin)     myalgia  . Pravachol (Pravastatin Sodium)   . Zocor (Simvastatin)      Current Outpatient Prescriptions on File Prior to Visit  Medication Sig Dispense Refill  . carvedilol (COREG) 12.5 MG tablet Take 12.5 mg by mouth 2 (two) times daily.      . clopidogrel (PLAVIX) 75 MG tablet Take 1 tablet (75 mg total) by mouth daily.  30 tablet  6  . lisinopril (PRINIVIL,ZESTRIL) 10 MG tablet Take 10 mg by mouth daily.      . rosuvastatin (CRESTOR) 5 MG tablet Take 1 tablet (5 mg total) by mouth daily.  90 tablet  3  . spironolactone (ALDACTONE) 25 MG tablet Take 1 tablet (25 mg total) by mouth daily.  30 tablet  6   No current facility-administered medications on file prior to visit.     Past Medical History  Diagnosis Date  . MI (myocardial infarction)   . Hypertension   . Hepatitis A   . Chronic airway obstruction, not elsewhere classified   . Benign neoplasm of colon   . Coronary artery disease     Inferior MI in 1996 treated with TPA. Cardiac cath in 2011 at [facility] showed chronic occlusion of the proximal RCA and proximal left circumflex without obstructive disease in the LAD. Non-ST elevation myocardial infarction in October 2013 while on vacation in [place]. Cardiac catheterization showed plaque rupture in the proximal ramus with thrombus. He had balloon angioplasty done.   . Coronary atherosclerosis of native coronary artery   . Hyperlipidemia     Intolerance to statins.  [name] AAA (abdominal aortic aneurysm)  Small noted during cardiac catheterization in 2011.     Past Surgical History  Procedure Laterality Date  . Tonsillectomy and adenoidectomy    . Angioplasty    . Cardiac catheterization  2011  . Coronary angioplasty  1996    [facility] x1  . Cardiac catheterization  2/14    [facility]; 95% lesion in the ramus intermedius.   . Coronary angioplasty  2/14     Family History  Problem  Relation Age of Onset  . Heart attack Father      History   Social History  . Marital Status: Married    Spouse Name: N/A    Number of Children: N/A  . Years of Education: N/A   Occupational History  . Not on file.   Social History Main Topics  . Smoking status: Former Smoker -- 2.00 packs/day for 40 years    Types: Cigarettes  . Smokeless tobacco: Not on file  . Alcohol Use: Yes     Comment: occassionally  . Drug Use: No  . Sexually Active:    Other Topics Concern  . Not on file   Social History Narrative  . No narrative on file      PHYSICAL EXAM   BP 110/60  Pulse 82  Ht 5\' 11"  (1.803 m)  Wt 226 lb 4 oz (102.626 kg)  BMI 31.57 kg/m2 Constitutional: He is oriented to person, place, and time. He appears well-developed and well-nourished. No distress.  HENT: No nasal discharge.  Head: Normocephalic and atraumatic.  Eyes: Pupils are equal and round. Right eye exhibits no discharge. Left eye exhibits no discharge.  Neck: Normal range of motion. Neck supple. No JVD present. No thyromegaly present.  Cardiovascular: Normal rate, regular rhythm, normal heart sounds and. Exam reveals no gallop and no friction rub. No murmur heard.  Pulmonary/Chest: Effort normal and breath sounds normal. No stridor. No respiratory distress. He has no wheezes. He has no rales. He exhibits no tenderness.  Abdominal: Soft. Bowel sounds are normal. He exhibits no distension. There is no tenderness. There is no rebound and no guarding.  Musculoskeletal: Normal range of motion. He exhibits no edema and no tenderness.  Neurological: He is alert and oriented to person, place, and time. Coordination normal.  Skin: Skin is warm and dry. No rash noted. He is not diaphoretic. No erythema. No pallor.  Psychiatric: He has a normal mood and affect. His behavior is normal. Judgment and thought content normal.  Right radial pulse is normal. Right groin: No hematoma   QIO:NGEXB  Rhythm  - occasional  PAC    # PACs = 1. -Nonspecific QRS widening.   -Old inferolateral infarct.   -  Nonspecific T-abnormality.   ABNORMAL    ASSESSMENT AND PLAN

## 2012-12-12 NOTE — Assessment & Plan Note (Signed)
He is doing better after her recent angioplasty and drug-eluting stent placement to the ramus artery. His LAD had only mild 20% stenosis. The inferior wall was akinetic. Thus, he wouldn't have benefited much from CABG. Continue treatment with Plavix. I added aspirin 81 mg daily.

## 2013-02-08 ENCOUNTER — Encounter (INDEPENDENT_AMBULATORY_CARE_PROVIDER_SITE_OTHER): Payer: Commercial Indemnity

## 2013-02-08 DIAGNOSIS — I700 Atherosclerosis of aorta: Secondary | ICD-10-CM

## 2013-02-08 DIAGNOSIS — I714 Abdominal aortic aneurysm, without rupture, unspecified: Secondary | ICD-10-CM

## 2013-02-10 NOTE — Progress Notes (Signed)
Pt informed of AAA duplex result and to repeat in 6 months.

## 2013-02-21 ENCOUNTER — Other Ambulatory Visit: Payer: Self-pay | Admitting: Cardiovascular Disease

## 2013-06-13 ENCOUNTER — Ambulatory Visit (INDEPENDENT_AMBULATORY_CARE_PROVIDER_SITE_OTHER): Payer: Commercial Indemnity | Admitting: Cardiovascular Disease

## 2013-06-13 ENCOUNTER — Encounter: Payer: Self-pay | Admitting: Cardiovascular Disease

## 2013-06-13 VITALS — BP 115/72 | HR 94 | Ht 71.0 in | Wt 225.8 lb

## 2013-06-13 DIAGNOSIS — E785 Hyperlipidemia, unspecified: Secondary | ICD-10-CM

## 2013-06-13 DIAGNOSIS — I714 Abdominal aortic aneurysm, without rupture, unspecified: Secondary | ICD-10-CM

## 2013-06-13 DIAGNOSIS — I2510 Atherosclerotic heart disease of native coronary artery without angina pectoris: Secondary | ICD-10-CM

## 2013-06-13 DIAGNOSIS — I5022 Chronic systolic (congestive) heart failure: Secondary | ICD-10-CM

## 2013-06-13 DIAGNOSIS — R0602 Shortness of breath: Secondary | ICD-10-CM

## 2013-06-13 NOTE — Assessment & Plan Note (Signed)
He appears to be euvolemic and on optimal medical therapy. Most recent ejection fraction was 42%.

## 2013-06-13 NOTE — Assessment & Plan Note (Signed)
He is doing very well with no recurrent symptoms of angina. Continue medical therapy. I plan on keeping him on dual antiplatelet therapy indefinitely if possible.

## 2013-06-13 NOTE — Progress Notes (Signed)
HPI  [name] is a pleasant 67 year old male who is here today for a followup visit.  He has known history of coronary artery disease status post inferior myocardial infarction in 1996. He was treated with TPA at that time . He underwent cardiac catheterization in 2011 by Dr. Lady Gary which showed chronically occluded proximal RCA and proximal left circumflex with good collaterals. There was no obstructive disease in the LAD.  He was hospitalized in [place] in 11,2013 with non-ST elevation myocardial infarction. Peak troponin was 48 with significant anterior and anterolateral ST depression without ST elevation. He underwent cardiac catheterization by Dr. Gerri Spore, [place] which showed chronic occlusion in the RCA and left circumflex . There was plaque rupture in the proximal ramus was thrombus formation. An angioplasty was attempted to the left circumflex but as expected was unsuccessful. He then underwent successful balloon angioplasty of the proximal ramus without complications. Ejection fraction was noted to be 30% with akinesis of the basal inferior wall with possible aneurysm formation.  I started him on Plavix 75 mg once daily as well as spironolactone 25 mg once daily. He underwent an echocardiogram which showed an ejection fraction of 40-45%. 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 He was seen in early 2014 for episodes of exertional substernal chest pain and dyspnea. He underwent a nuclear stress test which showed lateral ischemia in the distribution of the ramus artery. I performed cardiac catheterization which showed severe restenosis in the ostial ramus at the site of recent angioplasty done in [place]. I performed angioplasty and drug-eluting stent placement without complications. He has been doing well since then. He reports one episode of substernal chest pain a few weeks  ago while he was working in his yard in the hot weather. Noted that episode since then. He gets dizzy if he stands up suddenly.  Allergies  Allergen Reactions  . Crestor [Rosuvastatin]   . Livalo [Pitavastatin]     myalgia  . Pravachol [Pravastatin Sodium]   . Zocor [Simvastatin]      Current Outpatient Prescriptions on File Prior to Visit  Medication Sig Dispense Refill  . aspirin EC 81 MG tablet Take 1 tablet (81 mg total) by mouth daily.  30 tablet  6  . carvedilol (COREG) 12.5 MG tablet Take 12.5 mg by mouth 2 (two) times daily.      . clopidogrel (PLAVIX) 75 MG tablet TAKE 1 TABLET BY MOUTH EVERY DAY  30 tablet  6  . lisinopril (PRINIVIL,ZESTRIL) 10 MG tablet Take 10 mg by mouth daily.      . rosuvastatin (CRESTOR) 5 MG tablet Take 1 tablet (5 mg total) by mouth daily.  90 tablet  3  . spironolactone (ALDACTONE) 25 MG tablet TAKE 1 TABLET BY MOUTH EVERY DAY  30 tablet  6   No current facility-administered medications on file prior to visit.     Past Medical History  Diagnosis Date  . MI (myocardial infarction)   . Hypertension   . Hepatitis A   . Chronic airway obstruction, not elsewhere classified   . Benign neoplasm of colon   . Coronary artery disease     Inferior MI in 1996 treated with TPA. Cardiac cath in 2011 at [facility] showed chronic occlusion of the proximal RCA and proximal left circumflex without obstructive disease in the LAD. Non-ST elevation myocardial infarction in October 2013 while on vacation  in [place]. Cardiac catheterization showed plaque rupture in the proximal ramus with thrombus. He had balloon angioplasty done.   . Coronary atherosclerosis of native coronary artery   . Hyperlipidemia     Intolerance to statins.  [name] AAA (abdominal aortic aneurysm)     Small noted during cardiac catheterization in 2011.     Past Surgical History  Procedure Laterality Date  . Tonsillectomy and adenoidectomy    . Angioplasty    . Cardiac catheterization  2011  .  Cardiac catheterization  2/14    [facility]; 95% lesion in the ramus intermedius.   . Coronary angioplasty  1996    [facility] x1  . Coronary angioplasty  2/14    Severe restenosis in the ostial ramus. Status post angioplasty and drug-eluting stent placement with a 2.5 x 18 mm Xience drug-eluting stent     Family History  Problem Relation Age of Onset  . Heart attack Father      History   Social History  . Marital Status: Married    Spouse Name: N/A    Number of Children: N/A  . Years of Education: N/A   Occupational History  . Not on file.   Social History Main Topics  . Smoking status: Former Smoker -- 2.00 packs/day for 40 years    Types: Cigarettes  . Smokeless tobacco: Not on file  . Alcohol Use: Yes     Comment: occassionally  . Drug Use: No  . Sexual Activity:    Other Topics Concern  . Not on file   Social History Narrative  . No narrative on file      PHYSICAL EXAM   BP 115/72  Ht 5\' 11"  (1.803 m)  Wt 225 lb 12 oz (102.4 kg)  BMI 31.5 kg/m2 Constitutional: He is oriented to person, place, and time. He appears well-developed and well-nourished. No distress.  HENT: No nasal discharge.  Head: Normocephalic and atraumatic.  Eyes: Pupils are equal and round. Right eye exhibits no discharge. Left eye exhibits no discharge.  Neck: Normal range of motion. Neck supple. No JVD present. No thyromegaly present.  Cardiovascular: Normal rate, regular rhythm, normal heart sounds and. Exam reveals no gallop and no friction rub. No murmur heard.  Pulmonary/Chest: Effort normal and breath sounds normal. No stridor. No respiratory distress. He has no wheezes. He has no rales. He exhibits no tenderness.  Abdominal: Soft. Bowel sounds are normal. He exhibits no distension. There is no tenderness. There is no rebound and no guarding.  Musculoskeletal: Normal range of motion. He exhibits no edema and no tenderness.  Neurological: He is alert and oriented to person, place, and time.  Coordination normal.  Skin: Skin is warm and dry. No rash noted. He is not diaphoretic. No erythema. No pallor.  Psychiatric: He has a normal mood and affect. His behavior is normal. Judgment and thought content normal.    EKG: Sinus  Rhythm  -Nonspecific QRS widening.  Old inferior infarct  -  Nonspecific T-abnormality.   ABNORMAL    ASSESSMENT AND PLAN

## 2013-06-13 NOTE — Assessment & Plan Note (Signed)
He is tolerating small dose rosuvastatin. Most recent lipid profile showed a total cholesterol of 116, triglyceride 86, LDL of 63 and HDL of 55.

## 2013-06-13 NOTE — Assessment & Plan Note (Signed)
I will schedule him for a followup aortic ultrasound in December of this year. I will plan on referring him for repair once it reaches 5 cm.

## 2013-06-13 NOTE — Patient Instructions (Addendum)
Schedule aortic aneurysm Duplex in December.  Continue same medications.  Follow up in 6 months.

## 2013-06-27 ENCOUNTER — Ambulatory Visit: Payer: Commercial Indemnity | Admitting: Cardiovascular Disease

## 2013-07-19 ENCOUNTER — Other Ambulatory Visit: Payer: Self-pay | Admitting: Cardiovascular Disease

## 2013-07-19 ENCOUNTER — Other Ambulatory Visit: Payer: Self-pay | Admitting: *Deleted

## 2013-07-19 MED ORDER — ROSUVASTATIN CALCIUM 5 MG PO TABS: 5.0000 mg | ORAL_TABLET | Freq: Every day | ORAL | Status: DC

## 2013-07-19 NOTE — Telephone Encounter (Signed)
Refilled Crestor sent to [facility].

## 2013-08-10 ENCOUNTER — Other Ambulatory Visit: Payer: Self-pay

## 2013-08-15 ENCOUNTER — Encounter (INDEPENDENT_AMBULATORY_CARE_PROVIDER_SITE_OTHER): Payer: Commercial Indemnity

## 2013-08-15 DIAGNOSIS — I714 Abdominal aortic aneurysm, without rupture, unspecified: Secondary | ICD-10-CM

## 2013-08-18 ENCOUNTER — Telehealth: Payer: Self-pay

## 2013-08-18 NOTE — Telephone Encounter (Signed)
Spoke w/ pt.  He is aware of results.  

## 2013-08-18 NOTE — Telephone Encounter (Signed)
Left message for pt to call back  °

## 2013-08-18 NOTE — Telephone Encounter (Signed)
Message copied by Marilynne Halsted on Fri Aug 18, 2013 10:58 AM ------      Message from: Lorine Bears A      Created: Thu Aug 17, 2013  4:20 PM       Stable size of aortic aneurysm. Recommend repeat ultrasound in 6 months. ------

## 2013-08-18 NOTE — Telephone Encounter (Signed)
Message copied by Marilynne Halsted on Fri Aug 18, 2013 11:12 AM ------      Message from: Lorine Bears A      Created: Thu Aug 17, 2013  4:20 PM       Stable size of aortic aneurysm. Recommend repeat ultrasound in 6 months. ------

## 2013-09-23 ENCOUNTER — Other Ambulatory Visit: Payer: Self-pay | Admitting: Cardiovascular Disease

## 2013-09-25 ENCOUNTER — Other Ambulatory Visit: Payer: Self-pay | Admitting: *Deleted

## 2013-09-25 MED ORDER — CLOPIDOGREL BISULFATE 75 MG PO TABS: ORAL_TABLET | ORAL | Status: DC

## 2013-09-25 MED ORDER — SPIRONOLACTONE 25 MG PO TABS: ORAL_TABLET | ORAL | Status: DC

## 2013-09-25 NOTE — Telephone Encounter (Signed)
Requested Prescriptions   Signed Prescriptions Disp Refills  . clopidogrel (PLAVIX) 75 MG tablet 30 tablet 6    Sig: TAKE 1 TABLET BY MOUTH EVERY DAY    Authorizing Provider: Lorine Bears A    Ordering User: Jillyan Plitt C  . spironolactone (ALDACTONE) 25 MG tablet 30 tablet 6    Sig: TAKE 1 TABLET BY MOUTH EVERY DAY    Authorizing Provider: Lorine Bears A    Ordering User: Kendrick Fries

## 2013-12-11 ENCOUNTER — Ambulatory Visit (INDEPENDENT_AMBULATORY_CARE_PROVIDER_SITE_OTHER): Payer: Commercial Indemnity | Admitting: Cardiovascular Disease

## 2013-12-11 ENCOUNTER — Encounter: Payer: Self-pay | Admitting: Cardiovascular Disease

## 2013-12-11 VITALS — BP 130/78 | HR 73 | Ht 71.0 in | Wt 228.2 lb

## 2013-12-11 DIAGNOSIS — I714 Abdominal aortic aneurysm, without rupture, unspecified: Secondary | ICD-10-CM

## 2013-12-11 DIAGNOSIS — E785 Hyperlipidemia, unspecified: Secondary | ICD-10-CM

## 2013-12-11 DIAGNOSIS — I5022 Chronic systolic (congestive) heart failure: Secondary | ICD-10-CM

## 2013-12-11 DIAGNOSIS — I2510 Atherosclerotic heart disease of native coronary artery without angina pectoris: Secondary | ICD-10-CM

## 2013-12-11 NOTE — Assessment & Plan Note (Signed)
He has known history of intolerance to statins but has been able to take small dose Crestor 5 mg daily.

## 2013-12-11 NOTE — Patient Instructions (Signed)
Your physician has requested that you have an abdominal aorta duplex. During this test, an ultrasound is used to evaluate the aorta. Allow 30 minutes for this exam. Do not eat after midnight the day before and avoid carbonated beverages  Schedule the above test in mid May.   Continue same medications.   Your physician wants you to follow-up in: 6 months.  You will receive a reminder letter in the mail two months in advance. If you don't receive a letter, please call our office to schedule the follow-up appointment.

## 2013-12-11 NOTE — Assessment & Plan Note (Signed)
This was stable in size at 4.5 X 4.6 cm. I requested a followup study in May of this year.

## 2013-12-11 NOTE — Assessment & Plan Note (Signed)
He is doing very well with no symptoms suggestive of angina. Continue medical therapy. 

## 2013-12-11 NOTE — Progress Notes (Signed)
HPI  [name] is a pleasant 68 year old male who is here today for a followup visit.  He has known history of coronary artery disease status post inferior myocardial infarction in 1996. He was treated with TPA at that time . He underwent cardiac catheterization in 2011 by Dr. Ubaldo Glassing which showed chronically occluded proximal RCA and proximal left circumflex with good collaterals. There was no obstructive disease in the LAD.  He was hospitalized in [place] in 11,2013 with non-ST elevation myocardial infarction. Peak troponin was 48 with significant anterior and anterolateral ST depression without ST elevation. He underwent cardiac catheterization by Dr. Lake Bells, Alvis Lemmings which showed chronic occlusion in the RCA and left circumflex . There was plaque rupture in the proximal ramus with thrombus formation. An angioplasty was attempted to the left circumflex but as expected was unsuccessful. He then underwent successful balloon angioplasty of the proximal ramus without complications. Ejection fraction was noted to be 30% with akinesis of the basal inferior wall with possible aneurysm formation.  I started him on Plavix 75 mg once daily as well as spironolactone 25 mg once daily. He underwent an echocardiogram which showed an ejection fraction of 40-45%. 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 He was seen in early 2014 for episodes of exertional substernal chest pain and dyspnea. He underwent a nuclear stress test which showed lateral ischemia in the distribution of the ramus artery. I performed cardiac catheterization which showed severe restenosis in the ostial ramus at the site of recent angioplasty done in [place]. I performed angioplasty and drug-eluting stent placement without complications. He has been doing well since then. He denies chest pain, dyspnea or abdominal  pain.  Allergies  Allergen Reactions  . Crestor [Rosuvastatin]   . Livalo [Pitavastatin]     myalgia  . Pravachol [Pravastatin Sodium]   . Zocor [Simvastatin]      Current Outpatient Prescriptions on File Prior to Visit  Medication Sig Dispense Refill  . aspirin EC 81 MG tablet Take 1 tablet (81 mg total) by mouth daily.  30 tablet  6  . carvedilol (COREG) 12.5 MG tablet Take 12.5 mg by mouth 2 (two) times daily.      . clopidogrel (PLAVIX) 75 MG tablet TAKE 1 TABLET BY MOUTH EVERY DAY  30 tablet  6  . CRESTOR 5 MG tablet TAKE 1 TABLET DAILY  90 tablet  3  . lisinopril (PRINIVIL,ZESTRIL) 10 MG tablet Take 10 mg by mouth daily.      [name] spironolactone (ALDACTONE) 25 MG tablet TAKE 1 TABLET BY MOUTH EVERY DAY  30 tablet  6   No current facility-administered medications on file prior to visit.     Past Medical History  Diagnosis Date  . MI (myocardial infarction)   . Hypertension   . Hepatitis A   . Chronic airway obstruction, not elsewhere classified   . Benign neoplasm of colon   . Coronary artery disease     Inferior MI in 1996 treated with TPA. Cardiac cath in 2011 at [facility] showed chronic occlusion of the proximal RCA and proximal left circumflex without obstructive disease in the LAD. Non-ST elevation myocardial infarction in October 2013 while on vacation in [place]. Cardiac catheterization showed plaque rupture in the proximal ramus with thrombus. He had balloon angioplasty done.   . Coronary atherosclerosis of native coronary artery   . Hyperlipidemia  Intolerance to statins.  [name] AAA (abdominal aortic aneurysm)     Small noted during cardiac catheterization in 2011.     Past Surgical History  Procedure Laterality Date  . Tonsillectomy and adenoidectomy    . Angioplasty    . Cardiac catheterization  2011  . Cardiac catheterization  2/14    [facility]; 95% lesion in the ramus intermedius.   . Coronary angioplasty  1996    [facility] x1  . Coronary angioplasty  2/14    Severe  restenosis in the ostial ramus. Status post angioplasty and drug-eluting stent placement with a 2.5 x 18 mm Xience drug-eluting stent     Family History  Problem Relation Age of Onset  . Heart attack Father      History   Social History  . Marital Status: Married    Spouse Name: N/A    Number of Children: N/A  . Years of Education: N/A   Occupational History  . Not on file.   Social History Main Topics  . Smoking status: Former Smoker -- 2.00 packs/day for 40 years    Types: Cigarettes  . Smokeless tobacco: Not on file  . Alcohol Use: Yes     Comment: occassionally  . Drug Use: No  . Sexual Activity:    Other Topics Concern  . Not on file   Social History Narrative  . No narrative on file      PHYSICAL EXAM   BP 130/78  Pulse 73  Ht 5\' 11"  (1.803 m)  Wt 228 lb 4 oz (103.534 kg)  BMI 31.85 kg/m2 Constitutional: He is oriented to person, place, and time. He appears well-developed and well-nourished. No distress.  HENT: No nasal discharge.  Head: Normocephalic and atraumatic.  Eyes: Pupils are equal and round. Right eye exhibits no discharge. Left eye exhibits no discharge.  Neck: Normal range of motion. Neck supple. No JVD present. No thyromegaly present.  Cardiovascular: Normal rate, regular rhythm, normal heart sounds and. Exam reveals no gallop and no friction rub. No murmur heard.  Pulmonary/Chest: Effort normal and breath sounds normal. No stridor. No respiratory distress. He has no wheezes. He has no rales. He exhibits no tenderness.  Abdominal: Soft. Bowel sounds are normal. He exhibits no distension. There is no tenderness. There is no rebound and no guarding.  Musculoskeletal: Normal range of motion. He exhibits no edema and no tenderness.  Neurological: He is alert and oriented to person, place, and time. Coordination normal.  Skin: Skin is warm and dry. No rash noted. He is not diaphoretic. No erythema. No pallor.  Psychiatric: He has a normal mood  and affect. His behavior is normal. Judgment and thought content normal.    EKG: Sinus  Rhythm  -Nonspecific QRS widening.  Old inferior infarct  -  Nonspecific T-abnormality.   ABNORMAL    ASSESSMENT AND PLAN

## 2013-12-11 NOTE — Assessment & Plan Note (Signed)
He appears to be euvolemic and currently on optimal medical therapy.

## 2014-02-15 ENCOUNTER — Encounter (INDEPENDENT_AMBULATORY_CARE_PROVIDER_SITE_OTHER): Payer: Commercial Indemnity

## 2014-02-15 DIAGNOSIS — I714 Abdominal aortic aneurysm, without rupture, unspecified: Secondary | ICD-10-CM

## 2014-02-21 ENCOUNTER — Telehealth: Payer: Self-pay | Admitting: *Deleted

## 2014-02-21 DIAGNOSIS — I714 Abdominal aortic aneurysm, without rupture, unspecified: Secondary | ICD-10-CM

## 2014-02-21 NOTE — Telephone Encounter (Signed)
Message copied by Tracie Harrier on Wed Feb 21, 2014 11:01 AM ------      Message from: Kathlyn Sacramento A      Created: Fri Feb 16, 2014  8:31 AM       Stable aortic aneurysm. Repeat study in 6 months. ------

## 2014-02-21 NOTE — Telephone Encounter (Signed)
AAA Duplex ordered

## 2014-04-23 ENCOUNTER — Other Ambulatory Visit: Payer: Self-pay | Admitting: Cardiovascular Disease

## 2014-06-14 ENCOUNTER — Telehealth: Payer: Self-pay | Admitting: *Deleted

## 2014-06-14 NOTE — Telephone Encounter (Signed)
Care mark prior auth sent

## 2014-06-25 ENCOUNTER — Encounter: Payer: Self-pay | Admitting: Cardiovascular Disease

## 2014-06-25 ENCOUNTER — Ambulatory Visit (INDEPENDENT_AMBULATORY_CARE_PROVIDER_SITE_OTHER): Payer: Commercial Indemnity | Admitting: Cardiovascular Disease

## 2014-06-25 VITALS — BP 138/81 | HR 73 | Ht 71.0 in | Wt 229.8 lb

## 2014-06-25 DIAGNOSIS — I2510 Atherosclerotic heart disease of native coronary artery without angina pectoris: Secondary | ICD-10-CM

## 2014-06-25 DIAGNOSIS — E785 Hyperlipidemia, unspecified: Secondary | ICD-10-CM

## 2014-06-25 DIAGNOSIS — I714 Abdominal aortic aneurysm, without rupture, unspecified: Secondary | ICD-10-CM

## 2014-06-25 DIAGNOSIS — I5022 Chronic systolic (congestive) heart failure: Secondary | ICD-10-CM

## 2014-06-25 MED ORDER — LISINOPRIL 20 MG PO TABS
20.0000 mg | ORAL_TABLET | Freq: Every day | ORAL | Status: DC
Start: 2014-06-25 — End: 2014-12-24

## 2014-06-25 NOTE — Assessment & Plan Note (Signed)
He is doing well with no symptoms suggestive of angina. Continue medical therapy including dual antiplatelet therapy long-term.

## 2014-06-25 NOTE — Assessment & Plan Note (Signed)
He appears to be euvolemic and currently New York Heart Association class II. Spironolactone was discontinued due to erectile dysfunction. Continue treatment with carvedilol and lisinopril. I increased the dose of lisinopril to 20 mg daily for better blood pressure control.

## 2014-06-25 NOTE — Patient Instructions (Signed)
Your physician has recommended you make the following change in your medication:  Increase Lisinopril to 20 mg once daily   Your physician wants you to follow-up in: 6 months. You will receive a reminder letter in the mail two months in advance. If you don't receive a letter, please call our office to schedule the follow-up appointment.  Your physician has requested that you have an abdominal aorta duplex in November. During this test, an ultrasound is used to evaluate the aorta. Allow 30 minutes for this exam. Do not eat after midnight the day before and avoid carbonated beverages

## 2014-06-25 NOTE — Assessment & Plan Note (Signed)
He is due for an abdominal aortic ultrasound in November which I ordered. I will refer to vascular surgery once the aneurysm is 5 cm

## 2014-06-25 NOTE — Progress Notes (Signed)
HPI  [name] is a pleasant 68 year old male who is here today for a followup visit.  He has known history of coronary artery disease status post inferior myocardial infarction in 1996. He was treated with TPA at that time . He underwent cardiac catheterization in 2011 by Dr. Ubaldo Glassing which showed chronically occluded proximal RCA and proximal left circumflex with good collaterals. There was no obstructive disease in the LAD.  He was hospitalized in [place] in 11,2013 with non-ST elevation myocardial infarction. Peak troponin was 48 with significant anterior and anterolateral ST depression without ST elevation. He underwent cardiac catheterization by Dr. Lake Bells, Alvis Lemmings which showed chronic occlusion of the RCA and left circumflex . There was plaque rupture in the proximal ramus with thrombus formation. An angioplasty was attempted to the left circumflex but as expected was unsuccessful. He then underwent successful balloon angioplasty of the proximal ramus without complications. Ejection fraction was noted to be 30% with akinesis of the basal inferior wall with possible aneurysm formation.  I started him on Plavix 75 mg once daily as well as spironolactone 25 mg once daily. He underwent an echocardiogram which showed an ejection fraction of 40-45%. 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 He was seen in early 2014 for episodes of exertional substernal chest pain and dyspnea. He underwent a nuclear stress test which showed lateral ischemia in the distribution of the ramus artery. I performed cardiac catheterization which showed severe restenosis in the ostial ramus at the site of recent angioplasty done in [place]. I performed angioplasty and drug-eluting stent placement without complications. He has been doing well since then. He denies chest pain, dyspnea or abdominal pain.  Spironolactone was discontinued due to concerns about erectile dysfunction. He reports no change in symptoms after stopping the medication.  Allergies  Allergen Reactions  . Crestor [Rosuvastatin]   . Livalo [Pitavastatin]     myalgia  . Pravachol [Pravastatin Sodium]   . Zocor [Simvastatin]      Current Outpatient Prescriptions on File Prior to Visit  Medication Sig Dispense Refill  . aspirin EC 81 MG tablet Take 1 tablet (81 mg total) by mouth daily.  30 tablet  6  . carvedilol (COREG) 12.5 MG tablet Take 12.5 mg by mouth 2 (two) times daily.      . clopidogrel (PLAVIX) 75 MG tablet TAKE 1 TABLET BY MOUTH EVERY DAY  30 tablet  3  . CRESTOR 5 MG tablet TAKE 1 TABLET DAILY  90 tablet  3  . nitroGLYCERIN (NITROSTAT) 0.4 MG SL tablet Place 0.4 mg under the tongue every 5 (five) minutes as needed for chest pain.       No current facility-administered medications on file prior to visit.     Past Medical History  Diagnosis Date  . MI (myocardial infarction)   . Hypertension   . Hepatitis A   . Chronic airway obstruction, not elsewhere classified   . Benign neoplasm of colon   . Coronary artery disease     Inferior MI in 1996 treated with TPA. Cardiac cath in 2011 at [facility] showed chronic occlusion of the proximal RCA and proximal left circumflex without obstructive disease in the LAD. Non-ST elevation myocardial infarction in October 2013 while on vacation in [place]. Cardiac catheterization showed plaque rupture in the proximal ramus with thrombus. He had balloon angioplasty done.   . Coronary atherosclerosis  of native coronary artery   . Hyperlipidemia     Intolerance to statins.  [name] AAA (abdominal aortic aneurysm)     Small noted during cardiac catheterization in 2011.     Past Surgical History  Procedure Laterality Date  . Tonsillectomy and adenoidectomy    . Angioplasty    . Cardiac catheterization  2011  . Cardiac catheterization  2/14    [facility]; 95% lesion in the ramus  intermedius.   . Coronary angioplasty  1996    [facility] x1  . Coronary angioplasty  2/14    Severe restenosis in the ostial ramus. Status post angioplasty and drug-eluting stent placement with a 2.5 x 18 mm Xience drug-eluting stent     Family History  Problem Relation Age of Onset  . Heart attack Father      History   Social History  . Marital Status: Married    Spouse Name: N/A    Number of Children: N/A  . Years of Education: N/A   Occupational History  . Not on file.   Social History Main Topics  . Smoking status: Former Smoker -- 2.00 packs/day for 40 years    Types: Cigarettes  . Smokeless tobacco: Not on file  . Alcohol Use: Yes     Comment: occassionally  . Drug Use: No  . Sexual Activity:    Other Topics Concern  . Not on file   Social History Narrative  . No narrative on file      PHYSICAL EXAM   BP 138/81  Pulse 73  Ht 5\' 11"  (1.803 m)  Wt 229 lb 12 oz (104.214 kg)  BMI 32.06 kg/m2 Constitutional: He is oriented to person, place, and time. He appears well-developed and well-nourished. No distress.  HENT: No nasal discharge.  Head: Normocephalic and atraumatic.  Eyes: Pupils are equal and round. Right eye exhibits no discharge. Left eye exhibits no discharge.  Neck: Normal range of motion. Neck supple. No JVD present. No thyromegaly present.  Cardiovascular: Normal rate, regular rhythm, normal heart sounds and. Exam reveals no gallop and no friction rub. No murmur heard.  Pulmonary/Chest: Effort normal and breath sounds normal. No stridor. No respiratory distress. He has no wheezes. He has no rales. He exhibits no tenderness.  Abdominal: Soft. Bowel sounds are normal. He exhibits no distension. There is no tenderness. There is no rebound and no guarding.  Musculoskeletal: Normal range of motion. He exhibits no edema and no tenderness.  Neurological: He is alert and oriented to person, place, and time. Coordination normal.  Skin: Skin is warm and dry.  No rash noted. He is not diaphoretic. No erythema. No pallor.  Psychiatric: He has a normal mood and affect. His behavior is normal. Judgment and thought content normal.    EKG: Sinus  Rhythm  -Nonspecific QRS widening.   BORDERLINE    ASSESSMENT AND PLAN

## 2014-06-25 NOTE — Assessment & Plan Note (Signed)
He is tolerating small dose Crestor.

## 2014-07-27 ENCOUNTER — Other Ambulatory Visit: Payer: Self-pay | Admitting: Cardiovascular Disease

## 2014-08-09 DIAGNOSIS — I10 Essential (primary) hypertension: Secondary | ICD-10-CM | POA: Insufficient documentation

## 2014-08-09 DIAGNOSIS — E7800 Pure hypercholesterolemia, unspecified: Secondary | ICD-10-CM | POA: Insufficient documentation

## 2014-08-25 ENCOUNTER — Other Ambulatory Visit: Payer: Self-pay | Admitting: Cardiovascular Disease

## 2014-08-27 ENCOUNTER — Encounter (INDEPENDENT_AMBULATORY_CARE_PROVIDER_SITE_OTHER): Payer: Commercial Indemnity

## 2014-08-27 DIAGNOSIS — I714 Abdominal aortic aneurysm, without rupture, unspecified: Secondary | ICD-10-CM

## 2014-12-16 ENCOUNTER — Other Ambulatory Visit: Payer: Self-pay | Admitting: Cardiovascular Disease

## 2014-12-24 ENCOUNTER — Ambulatory Visit (INDEPENDENT_AMBULATORY_CARE_PROVIDER_SITE_OTHER): Payer: Medicare Other | Admitting: Cardiovascular Disease

## 2014-12-24 ENCOUNTER — Encounter: Payer: Self-pay | Admitting: Cardiovascular Disease

## 2014-12-24 VITALS — BP 155/94 | HR 75 | Ht 71.0 in | Wt 235.5 lb

## 2014-12-24 DIAGNOSIS — I2510 Atherosclerotic heart disease of native coronary artery without angina pectoris: Secondary | ICD-10-CM

## 2014-12-24 DIAGNOSIS — E785 Hyperlipidemia, unspecified: Secondary | ICD-10-CM | POA: Diagnosis not present

## 2014-12-24 DIAGNOSIS — I5022 Chronic systolic (congestive) heart failure: Secondary | ICD-10-CM

## 2014-12-24 DIAGNOSIS — I714 Abdominal aortic aneurysm, without rupture, unspecified: Secondary | ICD-10-CM

## 2014-12-24 MED ORDER — LISINOPRIL 40 MG PO TABS: 40.0000 mg | ORAL_TABLET | Freq: Every day | ORAL | Status: DC

## 2014-12-24 NOTE — Progress Notes (Signed)
HPI  [name] is a pleasant 69 year old male who is here today for a followup visit.  He has known history of coronary artery disease status post inferior myocardial infarction in 1996. He was treated with TPA at that time . He underwent cardiac catheterization in 2011 by Dr. Ubaldo Hunter which showed chronically occluded proximal RCA and proximal left circumflex with good collaterals. There was no obstructive disease in the LAD.  He was hospitalized in [place] in 11,2013 with non-ST elevation myocardial infarction. Peak troponin was 48 with significant anterior and anterolateral ST depression without ST elevation. He underwent cardiac catheterization by Dr. Lake Hunter, Garrett Hunter which showed chronic occlusion of the RCA and left circumflex . There was plaque rupture in the proximal ramus with thrombus formation. An angioplasty was attempted to the left circumflex but as expected was unsuccessful. He then underwent successful balloon angioplasty of the proximal ramus without complications. Ejection fraction was noted to be 30% with akinesis of the basal inferior wall with possible aneurysm formation.  He underwent an echocardiogram in 07/2012 which showed an ejection fraction of 40-45%. 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 this has been stable in size since then. He was seen in early 2014 for episodes of exertional substernal chest pain and dyspnea. He underwent a nuclear stress test which showed lateral ischemia in the distribution of the ramus artery. I performed cardiac catheterization which showed severe restenosis in the ostial ramus at the site of recent angioplasty done in [place]. I performed angioplasty and drug-eluting stent placement without complications. He has been doing well since then. He denies chest pain, dyspnea or abdominal pain. Spironolactone was discontinued due to  concerns about erectile dysfunction.     Allergies  Allergen Reactions  . Crestor [Rosuvastatin]   . Livalo [Pitavastatin]     myalgia  . Pravachol [Pravastatin Sodium]   . Zocor [Simvastatin]      Current Outpatient Prescriptions on File Prior to Visit  Medication Sig Dispense Refill  . aspirin EC 81 MG tablet Take 1 tablet (81 mg total) by mouth daily. 30 tablet 6  . carvedilol (COREG) 12.5 MG tablet Take 12.5 mg by mouth 2 (two) times daily.    . clopidogrel (PLAVIX) 75 MG tablet TAKE 1 TABLET BY MOUTH EVERY DAY 30 tablet 6  . CRESTOR 5 MG tablet TAKE 1 TABLET DAILY 90 tablet 3  . nitroGLYCERIN (NITROSTAT) 0.4 MG SL tablet Place 0.4 mg under the tongue every 5 (five) minutes as needed for chest pain.     No current facility-administered medications on file prior to visit.     Past Medical History  Diagnosis Date  . MI (myocardial infarction)   . Hypertension   . Hepatitis A   . Chronic airway obstruction, not elsewhere classified   . Benign neoplasm of colon   . Coronary artery disease     Inferior MI in 1996 treated with TPA. Cardiac cath in 2011 at [facility] showed chronic occlusion of the proximal RCA and proximal left circumflex without obstructive disease in the LAD. Non-ST elevation myocardial infarction in October 2013 while on vacation in [place]. Cardiac catheterization showed plaque rupture in the proximal ramus with thrombus. He had balloon angioplasty done.   . Coronary atherosclerosis of native coronary artery   . Hyperlipidemia     Intolerance to statins.  [name] AAA (abdominal aortic aneurysm)  Small noted during cardiac catheterization in 2011.     Past Surgical History  Procedure Laterality Date  . Tonsillectomy and adenoidectomy    . Angioplasty    . Cardiac catheterization  2011  . Cardiac catheterization  2/14    [facility]; 95% lesion in the ramus intermedius.   . Coronary angioplasty  1996    [facility] x1  . Coronary angioplasty  2/14    Severe restenosis in  the ostial ramus. Status post angioplasty and drug-eluting stent placement with a 2.5 x 18 mm Xience drug-eluting stent     Family History  Problem Relation Age of Onset  . Heart attack Father      History   Social History  . Marital Status: Married    Spouse Name: N/A  . Number of Children: N/A  . Years of Education: N/A   Occupational History  . Not on file.   Social History Main Topics  . Smoking status: Former Smoker -- 2.00 packs/day for 40 years    Types: Cigarettes  . Smokeless tobacco: Not on file  . Alcohol Use: Yes     Comment: occassionally  . Drug Use: No  . Sexual Activity: Not on file   Other Topics Concern  . Not on file   Social History Narrative      PHYSICAL EXAM   BP 155/94 mmHg  Pulse 75  Ht 5\' 11"  (1.803 m)  Wt 235 lb 8 oz (106.822 kg)  BMI 32.86 kg/m2 Constitutional: He is oriented to person, place, and time. He appears well-developed and well-nourished. No distress.  HENT: No nasal discharge.  Head: Normocephalic and atraumatic.  Eyes: Pupils are equal and round. Right eye exhibits no discharge. Left eye exhibits no discharge.  Neck: Normal range of motion. Neck supple. No JVD present. No thyromegaly present.  Cardiovascular: Normal rate, regular rhythm, normal heart sounds and. Exam reveals no gallop and no friction rub. No murmur heard.  Pulmonary/Chest: Effort normal and breath sounds normal. No stridor. No respiratory distress. He has no wheezes. He has no rales. He exhibits no tenderness.  Abdominal: Soft. Bowel sounds are normal. He exhibits no distension. There is no tenderness. There is no rebound and no guarding.  Musculoskeletal: Normal range of motion. He exhibits no edema and no tenderness.  Neurological: He is alert and oriented to person, place, and time. Coordination normal.  Skin: Skin is warm and dry. No rash noted. He is not diaphoretic. No erythema. No pallor.  Psychiatric: He has a normal mood and affect. His behavior  is normal. Judgment and thought content normal.    EKG: Sinus  Rhythm  Old inferior infarct  ABNORMAL     ASSESSMENT AND PLAN

## 2014-12-24 NOTE — Assessment & Plan Note (Signed)
This was stable in size. He is due for repeat ultrasound in late May of this year.

## 2014-12-24 NOTE — Patient Instructions (Signed)
Increase Lisinopril to 40 mg once daily.  Continue other medications.   Your physician wants you to follow-up in: 6 months.  You will receive a reminder letter in the mail two months in advance. If you don't receive a letter, please call our office to schedule the follow-up appointment.

## 2014-12-24 NOTE — Assessment & Plan Note (Signed)
He appears to be euvolemic and currently New York Heart Association class II. Spironolactone was discontinued due to erectile dysfunction. Continue treatment with carvedilol and lisinopril. I increased the dose of lisinopril to 40 mg daily for better blood pressure control.

## 2014-12-24 NOTE — Assessment & Plan Note (Signed)
He has known intolerance to statins but he has been able to take small dose rosuvastatin.

## 2014-12-24 NOTE — Assessment & Plan Note (Signed)
He is doing very well with no symptoms suggestive of angina. Continue medical therapy. 

## 2015-05-28 ENCOUNTER — Telehealth: Payer: Self-pay | Admitting: *Deleted

## 2015-05-28 NOTE — Telephone Encounter (Signed)
S/w pt regarding Dr. Tyrell Antonio recommendations. Pt verbalized understanding with no further questions.

## 2015-05-28 NOTE — Telephone Encounter (Signed)
Pt c/o swelling: STAT is pt has developed SOB within 24 hours  1. How long have you been experiencing swelling? Thurs 05/23/15. Hit it hard on a truck hit.  2. Where is the swelling located? Front shin bone of left leg. Patient worried might be a blood clot. Not sure if he should see his pcp or [name]  3.  Are you currently taking a "fluid pill"? No  4.  Are you currently SOB? No  5.  Have you traveled recently? No

## 2015-05-28 NOTE — Telephone Encounter (Signed)
That is not a sign of DVT. He might have a muscle contusion. I suggest evaluation in urgent care or with primary care physician.

## 2015-05-28 NOTE — Telephone Encounter (Signed)
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 Hard lump appeared, indicates it is still swollen, ankle purple. States it is still the same size as when it happened; no improvement. Asking if he should get checked out for DVT. Forward to [place].

## 2015-07-01 ENCOUNTER — Ambulatory Visit (INDEPENDENT_AMBULATORY_CARE_PROVIDER_SITE_OTHER): Payer: Medicare Other | Admitting: Cardiovascular Disease

## 2015-07-01 ENCOUNTER — Ambulatory Visit: Payer: PRIVATE HEALTH INSURANCE | Admitting: Cardiovascular Disease

## 2015-07-01 ENCOUNTER — Encounter: Payer: Self-pay | Admitting: Cardiovascular Disease

## 2015-07-01 VITALS — BP 148/90 | HR 70 | Ht 71.0 in | Wt 236.5 lb

## 2015-07-01 DIAGNOSIS — E785 Hyperlipidemia, unspecified: Secondary | ICD-10-CM

## 2015-07-01 DIAGNOSIS — I714 Abdominal aortic aneurysm, without rupture, unspecified: Secondary | ICD-10-CM

## 2015-07-01 DIAGNOSIS — I2510 Atherosclerotic heart disease of native coronary artery without angina pectoris: Secondary | ICD-10-CM

## 2015-07-01 DIAGNOSIS — I5022 Chronic systolic (congestive) heart failure: Secondary | ICD-10-CM

## 2015-07-01 DIAGNOSIS — N5201 Erectile dysfunction due to arterial insufficiency: Secondary | ICD-10-CM

## 2015-07-01 DIAGNOSIS — N529 Male erectile dysfunction, unspecified: Secondary | ICD-10-CM | POA: Insufficient documentation

## 2015-07-01 MED ORDER — SILDENAFIL CITRATE 50 MG PO TABS: 50.0000 mg | ORAL_TABLET | Freq: Every day | ORAL | Status: DC | PRN

## 2015-07-01 NOTE — Assessment & Plan Note (Signed)
He is due for repeat ultrasound. We might need to be more aggressive with his hypertension control.

## 2015-07-01 NOTE — Assessment & Plan Note (Signed)
Continue treatment with carvedilol and lisinopril. I might consider resuming spironolactone given that stopping the medication did not resolve his erectile dysfunction.

## 2015-07-01 NOTE — Patient Instructions (Signed)
Medication Instructions:  Your physician has recommended you make the following change in your medication:  START taking viagra as needed   Labwork: none  Testing/Procedures: Your physician has requested that you have an abdominal aorta duplex. During this test, an ultrasound is used to evaluate the aorta. Allow 30 minutes for this exam. Do not eat after midnight the day before and avoid carbonated beverages   Follow-Up: Your physician wants you to follow-up in: six months with [name]. You will receive a reminder letter in the mail two months in advance. If you don't receive a letter, please call our office to schedule the follow-up appointment.   Any Other Special Instructions Will Be Listed Below (If Applicable).

## 2015-07-01 NOTE — Assessment & Plan Note (Signed)
He is doing well overall with no symptoms suggestive of angina. I recommend continuing medical therapy.

## 2015-07-01 NOTE — Progress Notes (Signed)
HPI  [name] is a pleasant 69 year old male who is here today for a followup visit.  He has known history of coronary artery disease status post inferior myocardial infarction in 1996. He was treated with TPA at that time . He underwent cardiac catheterization in 2011 by Dr. Ubaldo Hunter which showed chronically occluded proximal RCA and proximal left circumflex with good collaterals. There was no obstructive disease in the LAD.  He was hospitalized in [place] in 11,2013 with non-ST elevation myocardial infarction. Peak troponin was 48 with significant anterior and anterolateral ST depression without ST elevation. He underwent cardiac catheterization by Dr. Lake Hunter, Garrett Hunter which showed chronic occlusion of the RCA and left circumflex . There was plaque rupture in the proximal ramus with thrombus formation. An angioplasty was attempted to the left circumflex but as expected was unsuccessful. He then underwent successful balloon angioplasty of the proximal ramus without complications. Ejection fraction was noted to be 30% with akinesis of the basal inferior wall with possible aneurysm formation.  He underwent an echocardiogram in 07/2012 which showed an ejection fraction of 40-45%. 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 this has been stable in size since then. He was seen in early 2014 for episodes of exertional substernal chest pain and dyspnea. He underwent a nuclear stress test which showed lateral ischemia in the distribution of the ramus artery. I performed cardiac catheterization which showed severe restenosis in the ostial ramus at the site of recent angioplasty done in [place]. I performed angioplasty and drug-eluting stent placement without complications. He has been doing well since then. He denies chest pain, dyspnea or abdominal pain. Spironolactone was discontinued due to  concerns about erectile dysfunction.  However, he reports no change. Other than erectile dysfunction, he is doing well.    Allergies  Allergen Reactions  . Crestor [Rosuvastatin]   . Livalo [Pitavastatin]     myalgia  . Pravachol [Pravastatin Sodium]   . Zocor [Simvastatin]      Current Outpatient Prescriptions on File Prior to Visit  Medication Sig Dispense Refill  . aspirin EC 81 MG tablet Take 1 tablet (81 mg total) by mouth daily. 30 tablet 6  . carvedilol (COREG) 12.5 MG tablet Take 12.5 mg by mouth 2 (two) times daily.    . clopidogrel (PLAVIX) 75 MG tablet TAKE 1 TABLET BY MOUTH EVERY DAY 30 tablet 6  . Coenzyme Q10 (COQ-10) 100 MG CAPS Take by mouth daily.    . CRESTOR 5 MG tablet TAKE 1 TABLET DAILY 90 tablet 3  . KRILL OIL PO Take by mouth daily.    [name] lisinopril (PRINIVIL,ZESTRIL) 40 MG tablet Take 1 tablet (40 mg total) by mouth daily. 90 tablet 3  . nitroGLYCERIN (NITROSTAT) 0.4 MG SL tablet Place 0.4 mg under the tongue every 5 (five) minutes as needed for chest pain.    . vitamin C (ASCORBIC ACID) 500 MG tablet Take 500 mg by mouth daily.     No current facility-administered medications on file prior to visit.     Past Medical History  Diagnosis Date  . MI (myocardial infarction)   . Hypertension   . Hepatitis A   . Chronic airway obstruction, not elsewhere classified   . Benign neoplasm of colon   . Coronary artery disease     Inferior MI in 1996 treated with TPA. Cardiac cath in 2011 at [facility]  showed chronic occlusion of the proximal RCA and proximal left circumflex without obstructive disease in the LAD. Non-ST elevation myocardial infarction in October 2013 while on vacation in [place]. Cardiac catheterization showed plaque rupture in the proximal ramus with thrombus. He had balloon angioplasty done.   . Coronary atherosclerosis of native coronary artery   . Hyperlipidemia     Intolerance to statins.  [name] AAA (abdominal aortic aneurysm)     Small noted during  cardiac catheterization in 2011.     Past Surgical History  Procedure Laterality Date  . Tonsillectomy and adenoidectomy    . Angioplasty    . Cardiac catheterization  2011  . Cardiac catheterization  2/14    [facility]; 95% lesion in the ramus intermedius.   . Coronary angioplasty  1996    [facility] x1  . Coronary angioplasty  2/14    Severe restenosis in the ostial ramus. Status post angioplasty and drug-eluting stent placement with a 2.5 x 18 mm Xience drug-eluting stent     Family History  Problem Relation Age of Onset  . Heart attack Father      Social History   Social History  . Marital Status: Married    Spouse Name: N/A  . Number of Children: N/A  . Years of Education: N/A   Occupational History  . Not on file.   Social History Main Topics  . Smoking status: Former Smoker -- 2.00 packs/day for 40 years    Types: Cigarettes  . Smokeless tobacco: Not on file  . Alcohol Use: Yes     Comment: occassionally  . Drug Use: No  . Sexual Activity: Not on file   Other Topics Concern  . Not on file   Social History Narrative      PHYSICAL EXAM   BP 148/90 mmHg  Pulse 70  Ht 5\' 11"  (1.803 m)  Wt 236 lb 8 oz (107.276 kg)  BMI 33.00 kg/m2 Constitutional: He is oriented to person, place, and time. He appears well-developed and well-nourished. No distress.  HENT: No nasal discharge.  Head: Normocephalic and atraumatic.  Eyes: Pupils are equal and round. Right eye exhibits no discharge. Left eye exhibits no discharge.  Neck: Normal range of motion. Neck supple. No JVD present. No thyromegaly present.  Cardiovascular: Normal rate, regular rhythm, normal heart sounds and. Exam reveals no gallop and no friction rub. No murmur heard.  Pulmonary/Chest: Effort normal and breath sounds normal. No stridor. No respiratory distress. He has no wheezes. He has no rales. He exhibits no tenderness.  Abdominal: Soft. Bowel sounds are normal. He exhibits no distension. There is no  tenderness. There is no rebound and no guarding.  Musculoskeletal: Normal range of motion. He exhibits no edema and no tenderness.  Neurological: He is alert and oriented to person, place, and time. Coordination normal.  Skin: Skin is warm and dry. No rash noted. He is not diaphoretic. No erythema. No pallor.  Psychiatric: He has a normal mood and affect. His behavior is normal. Judgment and thought content normal.    EKG: Sinus  Rhythm  -Nonspecific QRS widening.   BORDERLINE  ASSESSMENT AND PLAN

## 2015-07-01 NOTE — Assessment & Plan Note (Signed)
This has been a major issue for him. This has been chronic. I elected to start him on sildenafil. He knows not to take this with nitroglycerin.

## 2015-07-01 NOTE — Assessment & Plan Note (Signed)
He has known intolerance to statins but he has been able to take small dose rosuvastatin.  

## 2015-07-02 ENCOUNTER — Telehealth: Payer: Self-pay | Admitting: *Deleted

## 2015-07-02 MED ORDER — SILDENAFIL CITRATE 50 MG PO TABS: 50.0000 mg | ORAL_TABLET | Freq: Every day | ORAL | Status: DC | PRN

## 2015-07-02 NOTE — Telephone Encounter (Signed)
Spoke with Garrett Hunter with [facility] viagra refill cancelled and a new refill sent to the local pharmacy at [place] in [place].

## 2015-07-02 NOTE — Telephone Encounter (Signed)
°  1. Which medications need to be refilled? Viagra   2. Which pharmacy is medication to be sent to?  [id number] - [place], [place] [address]  3. Do they need a 30 day or 90 day supply? 30   4. Would they like a call back once the medication has been sent to the pharmacy? No

## 2015-07-12 ENCOUNTER — Ambulatory Visit (INDEPENDENT_AMBULATORY_CARE_PROVIDER_SITE_OTHER): Payer: Medicare Other

## 2015-07-12 ENCOUNTER — Other Ambulatory Visit: Payer: Self-pay

## 2015-07-12 DIAGNOSIS — I714 Abdominal aortic aneurysm, without rupture, unspecified: Secondary | ICD-10-CM

## 2015-07-22 ENCOUNTER — Other Ambulatory Visit: Payer: Self-pay | Admitting: Cardiovascular Disease

## 2015-07-22 MED ORDER — CLOPIDOGREL BISULFATE 75 MG PO TABS: 75.0000 mg | ORAL_TABLET | Freq: Every day | ORAL | Status: DC

## 2015-08-12 ENCOUNTER — Other Ambulatory Visit: Payer: Self-pay

## 2015-08-12 MED ORDER — ROSUVASTATIN CALCIUM 5 MG PO TABS: 5.0000 mg | ORAL_TABLET | Freq: Every day | ORAL | Status: DC

## 2015-12-27 ENCOUNTER — Encounter: Payer: Self-pay | Admitting: Cardiovascular Disease

## 2015-12-27 ENCOUNTER — Ambulatory Visit (INDEPENDENT_AMBULATORY_CARE_PROVIDER_SITE_OTHER): Payer: Medicare Other | Admitting: Cardiovascular Disease

## 2015-12-27 VITALS — BP 130/80 | HR 69 | Ht 71.0 in | Wt 229.0 lb

## 2015-12-27 DIAGNOSIS — I714 Abdominal aortic aneurysm, without rupture, unspecified: Secondary | ICD-10-CM

## 2015-12-27 DIAGNOSIS — I2510 Atherosclerotic heart disease of native coronary artery without angina pectoris: Secondary | ICD-10-CM

## 2015-12-27 DIAGNOSIS — I5022 Chronic systolic (congestive) heart failure: Secondary | ICD-10-CM

## 2015-12-27 NOTE — Progress Notes (Signed)
Cardiology Office Note   Date:  12/27/2015   ID:  Garrett Hunter, Garrett Hunter 1946/01/11, MRN [id number]  PCP:  Marcello Fennel, MD  Cardiologist:   Kathlyn Sacramento, MD   Chief Complaint  Patient presents with  . other    6 month follow up. Meds reviewed by the patient verbally. "doing well."       History of Present Illness: SILAS HOLSAPPLE is a 70 y.o. male who presents for a followup visit regarding coronary artery disease and abdominal aortic aneurysm.   He has known history of coronary artery disease status post inferior myocardial infarction in 1996. He was treated with TPA at that time . Cardiac cath in 2011 showed chronically occluded proximal RCA and proximal left circumflex with good collaterals. There was no obstructive disease in the LAD.  He was hospitalized in [place] in 11,2013 with non-ST elevation myocardial infarction. Peak troponin was 48 with significant anterior and anterolateral ST depression without ST elevation. He underwent cardiac catheterization  which showed chronic occlusion of the RCA and left circumflex . There was plaque rupture in the proximal ramus with thrombus formation. An angioplasty was attempted to the left circumflex but as expected was unsuccessful. He then underwent successful balloon angioplasty of the proximal ramus without complications. Ejection fraction was noted to be 30% with akinesis of the basal inferior wall with possible aneurysm formation.  He underwent an echocardiogram in 07/2012 which showed an ejection fraction of 40-45%. 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 this has been stable in size since then. He was seen in early 2014 for episodes of exertional substernal chest pain and dyspnea. He underwent a nuclear stress test which showed lateral ischemia in the distribution of the ramus artery. I performed  cardiac catheterization which showed severe restenosis in the ostial ramus at the site of recent angioplasty done in [place]. I performed angioplasty and drug-eluting stent placement without complications. He has been doing well since then. He denies chest pain, dyspnea or abdominal pain. Spironolactone was discontinued due to concerns about erectile dysfunction.   I prescribed sildenafil as needed for erectile dysfunction but he reports poor response. Otherwise he is doing very well. He is planning to go on a cruise next month.   Past Medical History  Diagnosis Date  . MI (myocardial infarction) ([place])   . Hypertension   . Hepatitis A   . Chronic airway obstruction, not elsewhere classified   . Benign neoplasm of colon   . Coronary artery disease     Inferior MI in 1996 treated with TPA. Cardiac cath in 2011 at [facility] showed chronic occlusion of the proximal RCA and proximal left circumflex without obstructive disease in the LAD. Non-ST elevation myocardial infarction in October 2013 while on vacation in [place]. Cardiac catheterization showed plaque rupture in the proximal ramus with thrombus. He had balloon angioplasty done.   . Coronary atherosclerosis of native coronary artery   . Hyperlipidemia     Intolerance to statins.  [name] AAA (abdominal aortic aneurysm) ([place])     Small noted during cardiac catheterization in 2011.    Past Surgical History  Procedure Laterality Date  . Tonsillectomy and adenoidectomy    . Angioplasty    . Cardiac catheterization  2011  . Cardiac catheterization  2/14    [facility]; 95% lesion in the ramus intermedius.   [name]  Coronary angioplasty  1996    [facility] x1  . Coronary angioplasty  2/14    Severe restenosis in the ostial ramus. Status post angioplasty and drug-eluting stent placement with a 2.5 x 18 mm Xience drug-eluting stent     Current Outpatient Prescriptions  Medication Sig Dispense Refill  . aspirin EC 81 MG tablet Take 1 tablet (81 mg total) by mouth  daily. 30 tablet 6  . carvedilol (COREG) 12.5 MG tablet Take 12.5 mg by mouth 2 (two) times daily.    . clopidogrel (PLAVIX) 75 MG tablet Take 1 tablet (75 mg total) by mouth daily. 30 tablet 6  . Coenzyme Q10 (COQ-10) 100 MG CAPS Take by mouth daily.    [name] KRILL OIL PO Take by mouth daily.    [name] lisinopril (PRINIVIL,ZESTRIL) 40 MG tablet Take 1 tablet (40 mg total) by mouth daily. 90 tablet 3  . nitroGLYCERIN (NITROSTAT) 0.4 MG SL tablet Place 0.4 mg under the tongue every 5 (five) minutes as needed for chest pain.    . rosuvastatin (CRESTOR) 5 MG tablet Take 1 tablet (5 mg total) by mouth daily. 90 tablet 3  . sildenafil (VIAGRA) 50 MG tablet Take 1 tablet (50 mg total) by mouth daily as needed for erectile dysfunction. 8 tablet 2  . vitamin C (ASCORBIC ACID) 500 MG tablet Take 500 mg by mouth daily.     No current facility-administered medications for this visit.    Allergies:   Crestor; Livalo; Pravachol; and Zocor    Social History:  The patient  reports that he has quit smoking. His smoking use included Cigarettes. He has a 80 pack-year smoking history. He does not have any smokeless tobacco history on file. He reports that he drinks alcohol. He reports that he does not use illicit drugs.   Family History:  The patient's family history includes Heart attack in his father.    ROS:  Please see the history of present illness.   Otherwise, review of systems are positive for none.   All other systems are reviewed and negative.    PHYSICAL EXAM: VS:  BP 130/80 mmHg  Pulse 69  Ht 5\' 11"  (1.803 m)  Wt 229 lb (103.874 kg)  BMI 31.95 kg/m2 , BMI Body mass index is 31.95 kg/(m^2). GEN: Well nourished, well developed, in no acute distress HEENT: normal Neck: no JVD, carotid bruits, or masses Cardiac: RRR; no murmurs, rubs, or gallops,no edema  Respiratory:  clear to auscultation bilaterally, normal work of breathing GI: soft, nontender, nondistended, + BS MS: no deformity or  atrophy Skin: warm and dry, no rash Neuro:  Strength and sensation are intact Psych: euthymic mood, full affect   EKG:  EKG is ordered today. The ekg ordered today demonstrates normal sinus rhythm with left axis deviation and nonspecific ST and T wave changes.   Recent Labs: No results found for requested labs within last 365 days.    Lipid Panel    Component Value Date/Time   CHOL 121 09/13/2012 0819   TRIG 78 09/13/2012 0819   HDL 37* 09/13/2012 0819   CHOLHDL 3.3 09/13/2012 0819   LDLCALC 68 09/13/2012 0819      Wt Readings from Last 3 Encounters:  12/27/15 229 lb (103.874 kg)  07/01/15 236 lb 8 oz (107.276 kg)  12/24/14 235 lb 8 oz (106.822 kg)        ASSESSMENT AND PLAN:  1.  Coronary artery disease involving native coronary arteries without angina: He is doing  extremely well with no anginal symptoms. Continue dual antiplatelet therapy indefinitely if possible.  2. Chronic systolic heart failure: Continue treatment with carvedilol and lisinopril. He appears to be euvolemic. Most recent ejection fraction was 40%.  3. Abdominal aortic aneurysm: This was moderate in size and stable. He is due for repeat ultrasound next month.  4. Hyperlipidemia: He has known intolerance to statins but has been able to tolerate small dose rosuvastatin. He had labs done at the [place] which according to him were optimal. I asked him to bring me a copy.  5. Erectile dysfunction: He did not respond very well to sildenafil. I asked him to follow-up with his primary care physician for further evaluation.    Disposition:   FU with me in 1 year  Signed,  Kathlyn Sacramento, MD  12/27/2015 10:33 AM    [place]

## 2015-12-27 NOTE — Patient Instructions (Addendum)
Medication Instructions: - Your physician recommends that you continue on your current medications as directed. Please refer to the Current Medication list given to you today.  Labwork: - none  Procedures/Testing: - Your physician has requested that you have an abdominal aorta duplex- 1 month (prior to last week of April). During this test, an ultrasound is used to evaluate the aorta. Allow 30 minutes for this exam. Do not eat after midnight the day before and avoid carbonated beverages  Follow-Up: - Your physician wants you to follow-up in: 1 year with [name]. You will receive a reminder letter in the mail two months in advance. If you don't receive a letter, please call our office to schedule the follow-up appointment.  Any Additional Special Instructions Will Be Listed Below (If Applicable).     If you need a refill on your cardiac medications before your next appointment, please call your pharmacy.

## 2016-01-17 ENCOUNTER — Ambulatory Visit: Payer: Medicare Other

## 2016-01-17 ENCOUNTER — Telehealth: Payer: Self-pay | Admitting: Cardiovascular Disease

## 2016-01-17 DIAGNOSIS — I714 Abdominal aortic aneurysm, without rupture, unspecified: Secondary | ICD-10-CM

## 2016-01-17 NOTE — Telephone Encounter (Signed)
Pt calling stating he has AAA ultra sound this morning Pt was told by Dr Fletcher Anon last time that he may be referred to vascular surgeon as well. Pt is going on cruise in about 2w  Would like to know if we may be able to get results done soon this way if he needs to cancel or move the days of the cruise he may be able to do so. Please call

## 2016-01-17 NOTE — Telephone Encounter (Signed)
Per verbal from [name], pt needs referral to Dr. Lucky Cowboy for AAA.  Attempted to contact [facility] this morning but they are closed today. Will call Monday.  S/w pt who verbalized understanding and will await appointment information

## 2016-01-20 ENCOUNTER — Other Ambulatory Visit: Payer: Self-pay | Admitting: Vascular Surgery

## 2016-01-20 ENCOUNTER — Telehealth: Payer: Self-pay | Admitting: Cardiovascular Disease

## 2016-01-20 ENCOUNTER — Ambulatory Visit
Admission: RE | Admit: 2016-01-20 | Discharge: 2016-01-20 | Disposition: A | Discharge status: Home / Self Care | Payer: Medicare Other | Source: Ambulatory Visit | Attending: Vascular Surgery | Admitting: Vascular Surgery

## 2016-01-20 DIAGNOSIS — I714 Abdominal aortic aneurysm, without rupture, unspecified: Secondary | ICD-10-CM

## 2016-01-20 DIAGNOSIS — I2510 Atherosclerotic heart disease of native coronary artery without angina pectoris: Secondary | ICD-10-CM | POA: Diagnosis not present

## 2016-01-20 DIAGNOSIS — E279 Disorder of adrenal gland, unspecified: Secondary | ICD-10-CM | POA: Diagnosis not present

## 2016-01-20 HISTORY — DX: Heart failure, unspecified: I50.9

## 2016-01-20 LAB — POCT I-STAT CREATININE: Creatinine, Ser: 1.1 mg/dL (ref 0.61–1.24)

## 2016-01-20 MED ORDER — IOPAMIDOL (ISOVUE-370) INJECTION 76%
100.0000 mL | Freq: Once | INTRAVENOUS | Status: AC | PRN
  Administered 2016-01-20: 100 mL via INTRAVENOUS

## 2016-01-20 NOTE — Telephone Encounter (Signed)
S/w Tina at [facility] who states pt can be seen today at 1:30pm. Notified pt who is agreeable w/plan. Provided address and phone number to [facility]. Records faxed to [phone]

## 2016-01-20 NOTE — Telephone Encounter (Signed)
Received records request From [facility] , forwarded to Parks Neptune in vascular department [place] .

## 2016-02-17 ENCOUNTER — Telehealth: Payer: Self-pay | Admitting: Cardiovascular Disease

## 2016-02-17 NOTE — Telephone Encounter (Addendum)
Spoke w/ pt.  He states that he is appreciative of Sharon's help getting an appt w/ [facility], but he has a friend that got him an appt @ [facility]. He reports that he spoke w/ [facility], was advised that "they were probably going to end up referring me there anyway.

## 2016-02-17 NOTE — Telephone Encounter (Signed)
Patient returning call from sharon re: vascular referral.  Patient says he is being followed by Dr. Sammuel Hines at [facility].  Please call to discuss.

## 2016-03-16 ENCOUNTER — Other Ambulatory Visit: Payer: Self-pay

## 2016-03-16 MED ORDER — CLOPIDOGREL BISULFATE 75 MG PO TABS: 75.0000 mg | ORAL_TABLET | Freq: Every day | ORAL | Status: DC

## 2016-03-16 NOTE — Telephone Encounter (Signed)
Refill sent for plavix  

## 2016-04-06 ENCOUNTER — Telehealth: Payer: Self-pay | Admitting: Cardiovascular Disease

## 2016-04-06 NOTE — Telephone Encounter (Signed)
Per [name]: "I still don't see that he made an appointment to take care of his abdominal aortic aneurysm. Can you call him and check the status?  Thanks. "  Attempted to contact pt at home number. No answer, VM is full Work number is a non-working number.

## 2016-04-06 NOTE — Telephone Encounter (Signed)
Left message on machine for patient to contact the office.   

## 2016-04-08 NOTE — Telephone Encounter (Signed)
S/w pt who reports his [facility] vascular surgeon ordered a custom-made stent from [place] for his abdominal aortic aneurysm.  It takes 3-5 months to manufacture and receive and was ordered late-April/May. Inquired of his BP which he reports keeping under control and "so far, so good".  Forward to [name] to make aware.

## 2016-04-08 NOTE — Telephone Encounter (Signed)
Left message on machine for patient to contact the office.   

## 2016-08-24 DIAGNOSIS — E119 Type 2 diabetes mellitus without complications: Secondary | ICD-10-CM | POA: Insufficient documentation

## 2016-09-19 ENCOUNTER — Other Ambulatory Visit: Payer: Self-pay | Admitting: Cardiovascular Disease

## 2016-10-06 ENCOUNTER — Telehealth: Payer: Self-pay | Admitting: Cardiovascular Disease

## 2016-10-06 ENCOUNTER — Other Ambulatory Visit: Payer: Self-pay

## 2016-10-06 DIAGNOSIS — Z01810 Encounter for preprocedural cardiovascular examination: Secondary | ICD-10-CM

## 2016-10-06 NOTE — Telephone Encounter (Signed)
[facility] np Sonia Baller calling to check if it is ok to hold plavix 1 week prior to bypass in prep for AAA repair on 10-13-15 and then again February.    Please call to ok that he can hold plavix and continue  ASA 81 mg pp daily

## 2016-10-06 NOTE — Telephone Encounter (Signed)
Christell Faith, PA-C reviewed and recommends pt have echo prior to surgery. He is agreeable to hold plavix 1 week prior to procedure.  Left message for [facility]. Provided call back number for return call.  S/w pt who is agreeable to 1/3, 7:30am echo.  He understands to hold plavix 1 week prior to surgery.

## 2016-10-07 ENCOUNTER — Telehealth: Payer: Self-pay | Admitting: Cardiovascular Disease

## 2016-10-07 ENCOUNTER — Other Ambulatory Visit: Payer: Self-pay

## 2016-10-07 ENCOUNTER — Ambulatory Visit (INDEPENDENT_AMBULATORY_CARE_PROVIDER_SITE_OTHER): Payer: Medicare Other

## 2016-10-07 ENCOUNTER — Other Ambulatory Visit: Payer: Self-pay | Admitting: Physician Assistant

## 2016-10-07 DIAGNOSIS — Z01810 Encounter for preprocedural cardiovascular examination: Secondary | ICD-10-CM

## 2016-10-07 DIAGNOSIS — I429 Cardiomyopathy, unspecified: Secondary | ICD-10-CM

## 2016-10-07 DIAGNOSIS — I2510 Atherosclerotic heart disease of native coronary artery without angina pectoris: Secondary | ICD-10-CM

## 2016-10-07 NOTE — Telephone Encounter (Signed)
Faxed echo results to Sonia Baller, NP at [facility], [phone]

## 2016-10-07 NOTE — Telephone Encounter (Signed)
Sonia Baller, NP at [facility] called to confirm receipt of the message regarding Plavix. Pt scheduled for echo @ [facility] on 1/4 however, he did not inform us of this when echo was scheduled for today @ 7:30am in our [place] office.  Sonia Baller requests we fax results to her, [phone].

## 2016-10-08 ENCOUNTER — Other Ambulatory Visit: Payer: PRIVATE HEALTH INSURANCE

## 2016-11-20 ENCOUNTER — Encounter: Payer: Self-pay | Admitting: Cardiovascular Disease

## 2016-11-20 NOTE — Telephone Encounter (Signed)
This encounter was created in error - please disregard.

## 2016-12-28 ENCOUNTER — Ambulatory Visit (INDEPENDENT_AMBULATORY_CARE_PROVIDER_SITE_OTHER): Payer: Medicare Other | Admitting: Cardiovascular Disease

## 2016-12-28 ENCOUNTER — Encounter: Payer: Self-pay | Admitting: Cardiovascular Disease

## 2016-12-28 VITALS — BP 144/82 | HR 76 | Ht 71.0 in | Wt 224.2 lb

## 2016-12-28 DIAGNOSIS — I5022 Chronic systolic (congestive) heart failure: Secondary | ICD-10-CM

## 2016-12-28 DIAGNOSIS — I2510 Atherosclerotic heart disease of native coronary artery without angina pectoris: Secondary | ICD-10-CM

## 2016-12-28 DIAGNOSIS — I714 Abdominal aortic aneurysm, without rupture, unspecified: Secondary | ICD-10-CM

## 2016-12-28 DIAGNOSIS — E785 Hyperlipidemia, unspecified: Secondary | ICD-10-CM

## 2016-12-28 DIAGNOSIS — I10 Essential (primary) hypertension: Secondary | ICD-10-CM

## 2016-12-28 NOTE — Patient Instructions (Signed)
Medication Instructions: Continue same medications.   Labwork: None.   Procedures/Testing: None.   Follow-Up: 6 months with Dr. Kullen Tomasetti.   Any Additional Special Instructions Will Be Listed Below (If Applicable).     If you need a refill on your cardiac medications before your next appointment, please call your pharmacy.   

## 2016-12-28 NOTE — Progress Notes (Signed)
Cardiology Office Note   Date:  12/28/2016   ID:  Garrett Hunter 06/03/1946, MRN [id number]  PCP:  Marcello Fennel, MD  Cardiologist:   Kathlyn Sacramento, MD   Chief Complaint  Patient presents with  . other    47mo f/u. Pt states he is doing well. Reviewed meds with pt verbally.      History of Present Illness: Garrett Hunter is a 71 y.o. male who presents for a followup visit regarding coronary artery disease and abdominal aortic aneurysm.   He has known history of coronary artery disease status post inferior myocardial infarction in 1996. He was treated with TPA at that time .  He is known to have chronically occluded right coronary artery and small left circumflex with good collaterals. He had ostial ramus artery drug-eluting stent placement in February 2014 with no cardiac events since then.  Most recent echocardiogram in January 2018 showed low normal LV systolic function with an EF of 50-55% with hypokinesis of the basal inferior myocardium with borderline dilated aortic root at 3.9 cm and no evidence of pulmonary hypertension.  The patient is known to have abdominal aortic aneurysm which was monitored with significant growth noted in April 2017. He was referred to vascular surgery and underwent endovascular repair at [facility] by Dr. Sammuel Hines in February. He has been doing well and denies any chest pain, shortness of breath or claudication.  Past Medical History:  Diagnosis Date  . AAA (abdominal aortic aneurysm) ([place])    Small noted during cardiac catheterization in 2011.  [name] Benign neoplasm of colon   . CHF (congestive heart failure) ([place])   . Chronic airway obstruction, not elsewhere classified   . Coronary artery disease    Inferior MI in 1996 treated with TPA. Cardiac cath in 2011 at [facility] showed chronic occlusion of the proximal RCA and proximal left circumflex without obstructive disease in the LAD. Non-ST elevation myocardial infarction in October 2013 while on  vacation in [place]. Cardiac catheterization showed plaque rupture in the proximal ramus with thrombus. He had balloon angioplasty done.   . Coronary atherosclerosis of native coronary artery   . Hepatitis A   . Hyperlipidemia    Intolerance to statins.  . Hypertension   . MI (myocardial infarction)     Past Surgical History:  Procedure Laterality Date  . ABDOMINAL AORTIC ANEURYSM REPAIR     11/16/2016  . ANGIOPLASTY    . CARDIAC CATHETERIZATION  2011  . CARDIAC CATHETERIZATION  2/14   [facility]; 95% lesion in the ramus intermedius.   . CORONARY ANGIOPLASTY  1996   [facility] x1  . CORONARY ANGIOPLASTY  2/14   Severe restenosis in the ostial ramus. Status post angioplasty and drug-eluting stent placement with a 2.5 x 18 mm Xience drug-eluting stent  . TONSILLECTOMY AND ADENOIDECTOMY       Current Outpatient Prescriptions  Medication Sig Dispense Refill  . acetaminophen (TYLENOL) 325 MG tablet Take 650 mg by mouth as needed.    [name] amoxicillin-clavulanate (AUGMENTIN) 875-125 MG tablet Take by mouth 2 (two) times daily.    [name] aspirin EC 81 MG tablet Take 1 tablet (81 mg total) by mouth daily. 30 tablet 6  . carvedilol (COREG) 12.5 MG tablet Take 12.5 mg by mouth 2 (two) times daily.    . clopidogrel (PLAVIX) 75 MG tablet Take 1 tablet (75 mg total) by mouth daily. 30 tablet 6  . Coenzyme Q10 (COQ-10) 100 MG CAPS Take by mouth  daily.    . cyanocobalamin (TH VITAMIN B12) 100 MCG tablet Take 100 mcg by mouth daily.    . folic acid (FOLVITE) 371 MCG tablet Take 800 mcg by mouth daily.    [name] KRILL OIL PO Take by mouth daily.    [name] lisinopril (PRINIVIL,ZESTRIL) 40 MG tablet Take 1 tablet (40 mg total) by mouth daily. 90 tablet 3  . nitroGLYCERIN (NITROSTAT) 0.4 MG SL tablet Place 0.4 mg under the tongue every 5 (five) minutes as needed for chest pain.    . rosuvastatin (CRESTOR) 5 MG tablet TAKE 1 TABLET DAILY 90 tablet 3  . Saw Palmetto 160 MG CAPS Take 160 mg by mouth daily.    . vitamin C (ASCORBIC  ACID) 500 MG tablet Take 500 mg by mouth daily.     No current facility-administered medications for this visit.     Allergies:   Crestor [rosuvastatin]; Livalo [pitavastatin]; Pravachol [pravastatin sodium]; and Zocor [simvastatin]    Social History:  The patient  reports that he has quit smoking. His smoking use included Cigarettes. He has a 80.00 pack-year smoking history. He has quit using smokeless tobacco. His smokeless tobacco use included Chew. He reports that he drinks alcohol. He reports that he does not use drugs.   Family History:  The patient's family history includes Cancer (age of onset: 86) in his mother; Heart attack in his father; Hypertension in his brother and sister.    ROS:  Please see the history of present illness.   Otherwise, review of systems are positive for none.   All other systems are reviewed and negative.    PHYSICAL EXAM: VS:  BP (!) 144/82 (BP Location: Left Arm, Patient Position: Sitting, Cuff Size: Normal)   Pulse 76   Ht 5\' 11"  (1.803 m)   Wt 224 lb 4 oz (101.7 kg)   BMI 31.28 kg/m  , BMI Body mass index is 31.28 kg/m. GEN: Well nourished, well developed, in no acute distress  HEENT: normal  Neck: no JVD, carotid bruits, or masses Cardiac: RRR; no murmurs, rubs, or gallops,no edema  Respiratory:  clear to auscultation bilaterally, normal work of breathing GI: soft, nontender, nondistended, + BS MS: no deformity or atrophy  Skin: warm and dry, no rash Neuro:  Strength and sensation are intact Psych: euthymic mood, full affect   EKG:  EKG is ordered today. The ekg ordered today demonstrates normal sinus rhythm with left axis deviation and nonspecific ST and T wave changes. Prior inferior infarct.   Recent Labs: 01/20/2016: Creatinine, Ser 1.10    Lipid Panel    Component Value Date/Time   CHOL 121 09/13/2012 0819   TRIG 78 09/13/2012 0819   HDL 37 (L) 09/13/2012 0819   CHOLHDL 3.3 09/13/2012 0819   LDLCALC 68 09/13/2012 0819        Wt Readings from Last 3 Encounters:  12/28/16 224 lb 4 oz (101.7 kg)  12/27/15 229 lb (103.9 kg)  07/01/15 236 lb 8 oz (107.3 kg)        ASSESSMENT AND PLAN:  1.  Coronary artery disease involving native coronary arteries without angina: He is doing extremely well with no anginal symptoms. Continue dual antiplatelet therapy indefinitely if possible.  2. Chronic systolic heart failure: Continue treatment with carvedilol and lisinopril.  Most recent ejection fraction was 50-55%.  3. Abdominal aortic aneurysm:  Status post recent endovascular repair. Followed at [facility].   4. Hyperlipidemia: He has known intolerance to statins but has been able  to tolerate small dose rosuvastatin.   5. Essential hypertension: Blood pressure is mildly elevated: Continue to monitor and if blood pressure remains elevated, consider increasing carvedilol or adding amlodipine or a thiazide diuretic.  Disposition:   FU with me in 6 months.   Signed,  Kathlyn Sacramento, MD  12/28/2016 5:10 PM    [facility]

## 2016-12-31 NOTE — Addendum Note (Signed)
Addended by: Kittie Plater on: 12/31/2016 08:27 AM   Modules accepted: Orders

## 2017-03-18 ENCOUNTER — Telehealth: Payer: Self-pay | Admitting: Cardiovascular Disease

## 2017-03-18 NOTE — Telephone Encounter (Signed)
Pt asks if he should still be taking Plavix? Please call and advise.

## 2017-03-18 NOTE — Telephone Encounter (Signed)
Pt states pharmacist and his vascular surgeon inquired if he should still be taking plavix. Advised him to ask cardiologist. Per Dr. Tyrell Antonio March 2018 OV notes " Coronary artery disease involving native coronary arteries without angina: He is doing extremely well with no anginal symptoms. Continue dual antiplatelet therapy indefinitely if possible."  Reviewed w/pt who is agreeable to continue plavix and aspirin. Offered refills. Pt declined, stating he is getting refills at the [place].

## 2017-06-28 ENCOUNTER — Ambulatory Visit (INDEPENDENT_AMBULATORY_CARE_PROVIDER_SITE_OTHER): Payer: Medicare Other | Admitting: Cardiovascular Disease

## 2017-06-28 ENCOUNTER — Encounter: Payer: Self-pay | Admitting: Cardiovascular Disease

## 2017-06-28 VITALS — BP 130/64 | HR 83 | Ht 70.5 in | Wt 230.0 lb

## 2017-06-28 DIAGNOSIS — E785 Hyperlipidemia, unspecified: Secondary | ICD-10-CM

## 2017-06-28 DIAGNOSIS — I10 Essential (primary) hypertension: Secondary | ICD-10-CM | POA: Diagnosis not present

## 2017-06-28 DIAGNOSIS — I5022 Chronic systolic (congestive) heart failure: Secondary | ICD-10-CM | POA: Diagnosis not present

## 2017-06-28 DIAGNOSIS — I714 Abdominal aortic aneurysm, without rupture, unspecified: Secondary | ICD-10-CM

## 2017-06-28 DIAGNOSIS — I2510 Atherosclerotic heart disease of native coronary artery without angina pectoris: Secondary | ICD-10-CM

## 2017-06-28 NOTE — Progress Notes (Signed)
Cardiology Office Note   Date:  06/28/2017   ID:  Garrett, Hunter 04/04/1946, MRN [id number]  PCP:  Derinda Late, MD  Cardiologist:   Kathlyn Sacramento, MD   Chief Complaint  Patient presents with  . OTHER    6 month f/u. Meds reviewed verbally with pt.      History of Present Illness: Garrett Hunter is a 71 y.o. male who presents for a followup visit regarding coronary artery disease and abdominal aortic aneurysm.   He has known history of coronary artery disease status post inferior myocardial infarction in 1996. He was treated with TPA at that time .  He is known to have chronically occluded right coronary artery and small left circumflex with good collaterals. He had ostial ramus artery drug-eluting stent placement in February 2014 with no cardiac events since then.  Most recent echocardiogram in January 2018 showed low normal LV systolic function with an EF of 50-55% with hypokinesis of the basal inferior myocardium with borderline dilated aortic root at 3.9 cm and no evidence of pulmonary hypertension.  He had endovascular repair of abdominal aortic aneurysm in February 2018 by Dr. Sammuel Hines at [facility]. The patient needs to have some more procedures done in the near future regarding this and Plavix has been on hold. He has been doing well from a cardiac standpoint with no chest pain, shortness of breath or palpitations. He was switched from lisinopril to benazepril with hydrochlorothiazide due to elevated blood pressure. His blood pressure has improved since then.   Past Medical History:  Diagnosis Date  . AAA (abdominal aortic aneurysm) ([place])    Small noted during cardiac catheterization in 2011.  [name] Benign neoplasm of colon   . CHF (congestive heart failure) ([place])   . Chronic airway obstruction, not elsewhere classified   . Coronary artery disease    Inferior MI in 1996 treated with TPA. Cardiac cath in 2011 at [facility] showed chronic occlusion of the proximal RCA and  proximal left circumflex without obstructive disease in the LAD. Non-ST elevation myocardial infarction in October 2013 while on vacation in [place]. Cardiac catheterization showed plaque rupture in the proximal ramus with thrombus. He had balloon angioplasty done.   . Coronary atherosclerosis of native coronary artery   . Hepatitis A   . Hyperlipidemia    Intolerance to statins.  . Hypertension   . MI (myocardial infarction) [facility])     Past Surgical History:  Procedure Laterality Date  . ABDOMINAL AORTIC ANEURYSM REPAIR     11/16/2016  . ANGIOPLASTY    . CARDIAC CATHETERIZATION  2011  . CARDIAC CATHETERIZATION  2/14   [facility]; 95% lesion in the ramus intermedius.   . CORONARY ANGIOPLASTY  1996   [facility] x1  . CORONARY ANGIOPLASTY  2/14   Severe restenosis in the ostial ramus. Status post angioplasty and drug-eluting stent placement with a 2.5 x 18 mm Xience drug-eluting stent  . TONSILLECTOMY AND ADENOIDECTOMY       Current Outpatient Prescriptions  Medication Sig Dispense Refill  . acetaminophen (TYLENOL) 325 MG tablet Take 650 mg by mouth as needed.    [name] aspirin EC 81 MG tablet Take 1 tablet (81 mg total) by mouth daily. 30 tablet 6  . benazepril-hydrochlorthiazide (LOTENSIN HCT) 20-12.5 MG tablet Take 1 tablet by mouth daily.    . carvedilol (COREG) 12.5 MG tablet Take 12.5 mg by mouth 2 (two) times daily.    . clopidogrel (PLAVIX) 75 MG tablet Take 1  tablet (75 mg total) by mouth daily. 30 tablet 6  . Coenzyme Q10 (COQ-10) 100 MG CAPS Take by mouth daily.    . cyanocobalamin (TH VITAMIN B12) 100 MCG tablet Take 100 mcg by mouth daily.    . folic acid (FOLVITE) 892 MCG tablet Take 800 mcg by mouth daily.    [name] KRILL OIL PO Take by mouth daily.    . nitroGLYCERIN (NITROSTAT) 0.4 MG SL tablet Place 0.4 mg under the tongue every 5 (five) minutes as needed for chest pain.    . rosuvastatin (CRESTOR) 5 MG tablet TAKE 1 TABLET DAILY 90 tablet 3  . Saw Palmetto 160 MG CAPS Take 160 mg by  mouth daily.    . vitamin C (ASCORBIC ACID) 500 MG tablet Take 500 mg by mouth daily.     No current facility-administered medications for this visit.     Allergies:   Crestor [rosuvastatin]; Livalo [pitavastatin]; Pravachol [pravastatin sodium]; and Zocor [simvastatin]    Social History:  The patient  reports that he has quit smoking. His smoking use included Cigarettes. He has a 80.00 pack-year smoking history. He has quit using smokeless tobacco. His smokeless tobacco use included Chew. He reports that he drinks alcohol. He reports that he does not use drugs.   Family History:  The patient's family history includes Cancer (age of onset: 63) in his mother; Heart attack in his father; Hypertension in his brother and sister.    ROS:  Please see the history of present illness.   Otherwise, review of systems are positive for none.   All other systems are reviewed and negative.    PHYSICAL EXAM: VS:  BP 130/64 (BP Location: Left Arm, Patient Position: Sitting, Cuff Size: Normal)   Pulse 83   Ht 5' 10.5" (1.791 m)   Wt 230 lb (104.3 kg)   BMI 32.54 kg/m  , BMI Body mass index is 32.54 kg/m. GEN: Well nourished, well developed, in no acute distress  HEENT: normal  Neck: no JVD, carotid bruits, or masses Cardiac: RRR; no murmurs, rubs, or gallops,no edema  Respiratory:  clear to auscultation bilaterally, normal work of breathing GI: soft, nontender, nondistended, + BS MS: no deformity or atrophy  Skin: warm and dry, no rash Neuro:  Strength and sensation are intact Psych: euthymic mood, full affect   EKG:  EKG is ordered today. The ekg ordered today demonstrates normal sinus rhythm with left axis deviation and nonspecific ST and T wave changes.  Recent Labs: No results found for requested labs within last 8760 hours.    Lipid Panel    Component Value Date/Time   CHOL 121 09/13/2012 0819   TRIG 78 09/13/2012 0819   HDL 37 (L) 09/13/2012 0819   CHOLHDL 3.3 09/13/2012 0819    LDLCALC 68 09/13/2012 0819      Wt Readings from Last 3 Encounters:  06/28/17 230 lb (104.3 kg)  12/28/16 224 lb 4 oz (101.7 kg)  12/27/15 229 lb (103.9 kg)        ASSESSMENT AND PLAN:  1.  Coronary artery disease involving native coronary arteries without angina: He is doing extremely well with no anginal symptoms. Continue dual antiplatelet therapy indefinitely if possible. Clopidogrel can be held 5 days before surgical procedures as needed.  2. Chronic systolic heart failure: Continue treatment with carvedilol and benazepril.  Most recent ejection fraction was 50-55%.  3. Abdominal aortic aneurysm:  Status post  endovascular repair. Followed at [facility].   4. Hyperlipidemia: He  has known intolerance to statins but has been able to tolerate small dose rosuvastatin. I reviewed most recent lipid profile which showed an LDL of 65.  5. Essential hypertension: Blood pressure is  now well controlled after the addition of hydrochlorothiazide.  Disposition:   FU with me in 12 months.   Signed,  Kathlyn Sacramento, MD  06/28/2017 1:56 PM    [place]

## 2017-06-28 NOTE — Patient Instructions (Signed)
Medication Instructions: Continue same medications.   Labwork: None.   Procedures/Testing: None.   Follow-Up: 1 year with Dr. Travontae Freiberger.   Any Additional Special Instructions Will Be Listed Below (If Applicable).     If you need a refill on your cardiac medications before your next appointment, please call your pharmacy.   

## 2017-09-01 ENCOUNTER — Other Ambulatory Visit: Payer: Self-pay

## 2017-09-01 MED ORDER — CARVEDILOL 12.5 MG PO TABS: 12.5000 mg | ORAL_TABLET | Freq: Two times a day (BID) | ORAL | 3 refills | Status: DC

## 2017-09-02 ENCOUNTER — Telehealth: Payer: Self-pay | Admitting: Cardiovascular Disease

## 2017-09-02 MED ORDER — CARVEDILOL 12.5 MG PO TABS: 12.5000 mg | ORAL_TABLET | Freq: Two times a day (BID) | ORAL | 0 refills | Status: DC

## 2017-09-02 NOTE — Telephone Encounter (Signed)
Pt calling stating [facility] needs an authorization for patient to refill medications. He would like [place] to please send in 90 day supply   Carvedilol   He is also asking if we can send 5-7 day [facility] [place]   Just until they send it to him through the mail.

## 2017-09-02 NOTE — Telephone Encounter (Signed)
Spoke with patient. Patient stated he has been out of Carvedilol 12.5MG  for a few days and had a call from [place] needing authorization before they could refill. Patient asked if I could send in a weeks worth of Carvedilol 12.5 ro [facility] [place] until his mail order comes in. I told him I would do so and told him I would call to verify that [place] received refill for Carvedilol 12.5 .   Spoke with [place] from [place] to verify that they received refill for Carvedilol  12.5 who then transferred me to [employer] who verified they received the refill yesterday (09/01/2017) and it was processing stage.    Requested Prescriptions   Signed Prescriptions Disp Refills  . carvedilol (COREG) 12.5 MG tablet 14 tablet 0    Sig: Take 1 tablet (12.5 mg total) by mouth 2 (two) times daily.    Authorizing Provider: Kathlyn Sacramento A    Ordering User: Janan Ridge

## 2017-10-10 ENCOUNTER — Other Ambulatory Visit: Payer: Self-pay | Admitting: Cardiovascular Disease

## 2017-12-07 ENCOUNTER — Other Ambulatory Visit: Payer: Self-pay | Admitting: *Deleted

## 2017-12-07 ENCOUNTER — Telehealth: Payer: Self-pay | Admitting: Cardiovascular Disease

## 2017-12-07 MED ORDER — CLOPIDOGREL BISULFATE 75 MG PO TABS: 75.0000 mg | ORAL_TABLET | Freq: Every day | ORAL | 2 refills | Status: DC

## 2017-12-07 NOTE — Telephone Encounter (Signed)
Requested Prescriptions   Signed Prescriptions Disp Refills  . clopidogrel (PLAVIX) 75 MG tablet 90 tablet 2    Sig: Take 1 tablet (75 mg total) by mouth daily.    Authorizing Provider: Kathlyn Sacramento A    Ordering User: Britt Bottom

## 2017-12-07 NOTE — Telephone Encounter (Signed)
°*  STAT* If patient is at the pharmacy, call can be transferred to refill team.   1. Which medications need to be refilled? (please list name of each medication and dose if known)    Plavix 75 mg po q d   2. Which pharmacy/location (including street and city if local pharmacy) [facility]   3. Do they need a 30 day or 90 day supply? [place]

## 2018-06-30 ENCOUNTER — Encounter: Payer: Self-pay | Admitting: Cardiovascular Disease

## 2018-06-30 ENCOUNTER — Ambulatory Visit (INDEPENDENT_AMBULATORY_CARE_PROVIDER_SITE_OTHER): Payer: Medicare Other | Admitting: Cardiovascular Disease

## 2018-06-30 VITALS — BP 120/72 | HR 75 | Ht 71.0 in | Wt 229.0 lb

## 2018-06-30 DIAGNOSIS — I5022 Chronic systolic (congestive) heart failure: Secondary | ICD-10-CM

## 2018-06-30 DIAGNOSIS — I10 Essential (primary) hypertension: Secondary | ICD-10-CM | POA: Diagnosis not present

## 2018-06-30 DIAGNOSIS — I714 Abdominal aortic aneurysm, without rupture, unspecified: Secondary | ICD-10-CM

## 2018-06-30 DIAGNOSIS — I2510 Atherosclerotic heart disease of native coronary artery without angina pectoris: Secondary | ICD-10-CM | POA: Diagnosis not present

## 2018-06-30 DIAGNOSIS — E785 Hyperlipidemia, unspecified: Secondary | ICD-10-CM | POA: Diagnosis not present

## 2018-06-30 NOTE — Patient Instructions (Signed)
Medication Instructions: Your physician recommends that you continue on your current medications as directed. Please refer to the Current Medication list given to you today.  If you need a refill on your cardiac medications before your next appointment, please call your pharmacy.   Follow-Up: Your physician wants you to follow-up in 12 months with Dr. Arida. You will receive a reminder letter in the mail two months in advance. If you don't receive a letter, please call our office at [phone] to schedule this follow-up appointment.  Thank you for choosing [facility]!    

## 2018-06-30 NOTE — Progress Notes (Signed)
Cardiology Office Note   Date:  06/30/2018   ID:  Garrett, Hunter 1946-09-09, MRN [id number]  PCP:  Derinda Late, MD  Cardiologist:   Kathlyn Sacramento, MD   Chief Complaint  Patient presents with  . other    12 month follow up. "doing well." Meds reviewed by the pt. verbally.       History of Present Illness: Garrett Hunter is a 72 y.o. male who presents for a followup visit regarding coronary artery disease and abdominal aortic aneurysm.   He has known history of coronary artery disease status post inferior myocardial infarction in 1996. He was treated with TPA at that time .  He is known to have chronically occluded right coronary artery and small left circumflex with good collaterals. He had ostial ramus artery drug-eluting stent placement in February 2014 with no cardiac events since then.  Most recent echocardiogram in January 2018 showed low normal LV systolic function with an EF of 50-55% with hypokinesis of the basal inferior myocardium with borderline dilated aortic root at 3.9 cm and no evidence of pulmonary hypertension.  He had endovascular repair of abdominal aortic aneurysm in February 2018 by Dr. Sammuel Hines at [facility].   He has been doing extremely well with no recent chest pain, shortness of breath or palpitations.  He is tolerating small dose rosuvastatin.  Past Medical History:  Diagnosis Date  . AAA (abdominal aortic aneurysm) ([place])    Small noted during cardiac catheterization in 2011.  [name] Benign neoplasm of colon   . CHF (congestive heart failure) ([place])   . Chronic airway obstruction, not elsewhere classified   . Coronary artery disease    Inferior MI in 1996 treated with TPA. Cardiac cath in 2011 at [facility] showed chronic occlusion of the proximal RCA and proximal left circumflex without obstructive disease in the LAD. Non-ST elevation myocardial infarction in October 2013 while on vacation in [place]. Cardiac catheterization showed plaque rupture in the  proximal ramus with thrombus. He had balloon angioplasty done.   . Coronary atherosclerosis of native coronary artery   . Hepatitis A   . Hyperlipidemia    Intolerance to statins.  . Hypertension   . MI (myocardial infarction) [facility])     Past Surgical History:  Procedure Laterality Date  . ABDOMINAL AORTIC ANEURYSM REPAIR     11/16/2016  . ANGIOPLASTY    . CARDIAC CATHETERIZATION  2011  . CARDIAC CATHETERIZATION  2/14   [facility]; 95% lesion in the ramus intermedius.   . CORONARY ANGIOPLASTY  1996   [facility] x1  . CORONARY ANGIOPLASTY  2/14   Severe restenosis in the ostial ramus. Status post angioplasty and drug-eluting stent placement with a 2.5 x 18 mm Xience drug-eluting stent  . TONSILLECTOMY AND ADENOIDECTOMY       Current Outpatient Medications  Medication Sig Dispense Refill  . acetaminophen (TYLENOL) 325 MG tablet Take 650 mg by mouth as needed.    [name] aspirin EC 81 MG tablet Take 1 tablet (81 mg total) by mouth daily. 30 tablet 6  . carvedilol (COREG) 12.5 MG tablet Take 1 tablet (12.5 mg total) by mouth 2 (two) times daily. 14 tablet 0  . clopidogrel (PLAVIX) 75 MG tablet Take 1 tablet (75 mg total) by mouth daily. 90 tablet 2  . Coenzyme Q10 (COQ-10) 100 MG CAPS Take by mouth daily.    . cyanocobalamin (TH VITAMIN B12) 100 MCG tablet Take 100 mcg by mouth daily.    [name]  folic acid (FOLVITE) 476 MCG tablet Take 800 mcg by mouth daily.    [name] KRILL OIL PO Take by mouth daily.    [name] lisinopril-hydrochlorothiazide (PRINZIDE,ZESTORETIC) 20-12.5 MG tablet Take 1 tablet by mouth 2 (two) times daily.     . nitroGLYCERIN (NITROSTAT) 0.4 MG SL tablet Place 0.4 mg under the tongue every 5 (five) minutes as needed for chest pain.    . rosuvastatin (CRESTOR) 5 MG tablet TAKE 1 TABLET DAILY 90 tablet 3  . Saw Palmetto 160 MG CAPS Take 160 mg by mouth daily.    . vitamin C (ASCORBIC ACID) 500 MG tablet Take 500 mg by mouth daily.     No current facility-administered medications for this visit.      Allergies:   Crestor [rosuvastatin]; Livalo [pitavastatin]; Pravachol [pravastatin sodium]; and Zocor [simvastatin]    Social History:  The patient  reports that he has quit smoking. His smoking use included cigarettes. He has a 80.00 pack-year smoking history. He has quit using smokeless tobacco.  His smokeless tobacco use included chew. He reports that he drinks alcohol. He reports that he does not use drugs.   Family History:  The patient's family history includes Cancer (age of onset: 25) in his mother; Heart attack in his father; Hypertension in his brother and sister.    ROS:  Please see the history of present illness.   Otherwise, review of systems are positive for none.   All other systems are reviewed and negative.    PHYSICAL EXAM: VS:  BP 120/72 (BP Location: Left Arm, Patient Position: Sitting, Cuff Size: Normal)   Pulse 75   Ht 5\' 11"  (1.803 m)   Wt 229 lb (103.9 kg)   BMI 31.94 kg/m  , BMI Body mass index is 31.94 kg/m. GEN: Well nourished, well developed, in no acute distress  HEENT: normal  Neck: no JVD, carotid bruits, or masses Cardiac: RRR; no murmurs, rubs, or gallops,no edema  Respiratory:  clear to auscultation bilaterally, normal work of breathing GI: soft, nontender, nondistended, + BS MS: no deformity or atrophy  Skin: warm and dry, no rash Neuro:  Strength and sensation are intact Psych: euthymic mood, full affect   EKG:  EKG is ordered today. The ekg ordered today demonstrates normal sinus rhythm with left axis deviation and nonspecific ST and T wave changes.  Recent Labs: No results found for requested labs within last 8760 hours.    Lipid Panel    Component Value Date/Time   CHOL 121 09/13/2012 0819   TRIG 78 09/13/2012 0819   HDL 37 (L) 09/13/2012 0819   CHOLHDL 3.3 09/13/2012 0819   LDLCALC 68 09/13/2012 0819      Wt Readings from Last 3 Encounters:  06/30/18 229 lb (103.9 kg)  06/28/17 230 lb (104.3 kg)  12/28/16 224 lb 4 oz  (101.7 kg)        ASSESSMENT AND PLAN:  1.  Coronary artery disease involving native coronary arteries without angina: He is doing extremely well with no anginal symptoms. Continue dual antiplatelet therapy indefinitely if possible.  2. Chronic systolic heart failure: Continue treatment with carvedilol and benazepril.  Most recent ejection fraction was 50-55%.  3. Abdominal aortic aneurysm:  Status post  endovascular repair. Followed at [facility].   4. Hyperlipidemia: He has known intolerance to statins but has been able to tolerate small dose rosuvastatin. I reviewed most recent lipid profile which showed an LDL of 56.  5. Essential hypertension: Blood pressure is  now well controlled.    Disposition:   FU with me in 12 months.   Signed,  Kathlyn Sacramento, MD  06/30/2018 10:13 AM    [place]

## 2018-08-02 ENCOUNTER — Encounter: Payer: Self-pay | Admitting: Emergency Medicine

## 2018-08-02 ENCOUNTER — Other Ambulatory Visit: Payer: Self-pay

## 2018-08-02 ENCOUNTER — Inpatient Hospital Stay
Admission: EM | Admit: 2018-08-02 | Discharge: 2018-08-03 | DRG: 378 | Disposition: A | Discharge status: Home / Self Care | Payer: Medicare Other | Attending: Internal Medicine | Admitting: Internal Medicine

## 2018-08-02 DIAGNOSIS — E785 Hyperlipidemia, unspecified: Secondary | ICD-10-CM | POA: Diagnosis present

## 2018-08-02 DIAGNOSIS — K922 Gastrointestinal hemorrhage, unspecified: Secondary | ICD-10-CM | POA: Diagnosis not present

## 2018-08-02 DIAGNOSIS — I5042 Chronic combined systolic (congestive) and diastolic (congestive) heart failure: Secondary | ICD-10-CM | POA: Diagnosis present

## 2018-08-02 DIAGNOSIS — Z87891 Personal history of nicotine dependence: Secondary | ICD-10-CM

## 2018-08-02 DIAGNOSIS — K319 Disease of stomach and duodenum, unspecified: Secondary | ICD-10-CM | POA: Diagnosis present

## 2018-08-02 DIAGNOSIS — Z7902 Long term (current) use of antithrombotics/antiplatelets: Secondary | ICD-10-CM | POA: Diagnosis not present

## 2018-08-02 DIAGNOSIS — Z955 Presence of coronary angioplasty implant and graft: Secondary | ICD-10-CM

## 2018-08-02 DIAGNOSIS — D62 Acute posthemorrhagic anemia: Secondary | ICD-10-CM | POA: Diagnosis present

## 2018-08-02 DIAGNOSIS — K254 Chronic or unspecified gastric ulcer with hemorrhage: Secondary | ICD-10-CM | POA: Diagnosis present

## 2018-08-02 DIAGNOSIS — I2510 Atherosclerotic heart disease of native coronary artery without angina pectoris: Secondary | ICD-10-CM | POA: Diagnosis present

## 2018-08-02 DIAGNOSIS — I252 Old myocardial infarction: Secondary | ICD-10-CM | POA: Diagnosis not present

## 2018-08-02 DIAGNOSIS — Z7982 Long term (current) use of aspirin: Secondary | ICD-10-CM | POA: Diagnosis not present

## 2018-08-02 DIAGNOSIS — I110 Hypertensive heart disease with heart failure: Secondary | ICD-10-CM | POA: Diagnosis present

## 2018-08-02 DIAGNOSIS — J449 Chronic obstructive pulmonary disease, unspecified: Secondary | ICD-10-CM | POA: Diagnosis present

## 2018-08-02 DIAGNOSIS — K449 Diaphragmatic hernia without obstruction or gangrene: Secondary | ICD-10-CM | POA: Diagnosis present

## 2018-08-02 DIAGNOSIS — E538 Deficiency of other specified B group vitamins: Secondary | ICD-10-CM | POA: Diagnosis present

## 2018-08-02 DIAGNOSIS — Z79899 Other long term (current) drug therapy: Secondary | ICD-10-CM | POA: Diagnosis not present

## 2018-08-02 DIAGNOSIS — K921 Melena: Secondary | ICD-10-CM

## 2018-08-02 LAB — CBC
HCT: 31.5 % — ABNORMAL LOW (ref 39.0–52.0)
Hemoglobin: 10.3 g/dL — ABNORMAL LOW (ref 13.0–17.0)
MCH: 32.7 pg (ref 26.0–34.0)
MCHC: 32.7 g/dL (ref 30.0–36.0)
MCV: 100 fL (ref 80.0–100.0)
Platelets: 238 10*3/uL (ref 150–400)
RBC: 3.15 MIL/uL — ABNORMAL LOW (ref 4.22–5.81)
RDW: 13.4 % (ref 11.5–15.5)
WBC: 14.3 10*3/uL — ABNORMAL HIGH (ref 4.0–10.5)
nRBC: 0 % (ref 0.0–0.2)

## 2018-08-02 LAB — TYPE AND SCREEN
ABO/RH(D): O NEG
Antibody Screen: NEGATIVE

## 2018-08-02 LAB — COMPREHENSIVE METABOLIC PANEL
ALT: 20 U/L (ref 0–44)
AST: 19 U/L (ref 15–41)
Albumin: 4.2 g/dL (ref 3.5–5.0)
Alkaline Phosphatase: 47 U/L (ref 38–126)
Anion gap: 10 (ref 5–15)
BUN: 66 mg/dL — ABNORMAL HIGH (ref 8–23)
CO2: 27 mmol/L (ref 22–32)
Calcium: 8.8 mg/dL — ABNORMAL LOW (ref 8.9–10.3)
Chloride: 104 mmol/L (ref 98–111)
Creatinine, Ser: 1.44 mg/dL — ABNORMAL HIGH (ref 0.61–1.24)
GFR calc Af Amer: 55 mL/min — ABNORMAL LOW (ref >60)
GFR calc non Af Amer: 48 mL/min — ABNORMAL LOW (ref >60)
Glucose, Bld: 121 mg/dL — ABNORMAL HIGH (ref 70–99)
Potassium: 4 mmol/L (ref 3.5–5.1)
Sodium: 141 mmol/L (ref 135–145)
Total Bilirubin: 0.6 mg/dL (ref 0.3–1.2)
Total Protein: 6.8 g/dL (ref 6.5–8.1)

## 2018-08-02 LAB — HEMOGLOBIN: Hemoglobin: 9.3 g/dL — ABNORMAL LOW (ref 13.0–17.0)

## 2018-08-02 LAB — IRON AND TIBC
Iron: 102 ug/dL (ref 45–182)
Saturation Ratios: 30 % (ref 17.9–39.5)
TIBC: 341 ug/dL (ref 250–450)
UIBC: 239 ug/dL

## 2018-08-02 LAB — FERRITIN: Ferritin: 137 ng/mL (ref 24–336)

## 2018-08-02 LAB — PROTIME-INR
INR: 1
Prothrombin Time: 13.1 seconds (ref 11.4–15.2)

## 2018-08-02 LAB — APTT: aPTT: 27 seconds (ref 24–36)

## 2018-08-02 LAB — FOLATE: Folate: 30 ng/mL (ref >5.9)

## 2018-08-02 MED ORDER — SODIUM CHLORIDE 0.9 % IV SOLN
INTRAVENOUS | Status: DC
  Administered 2018-08-02 (×2): via INTRAVENOUS

## 2018-08-02 MED ORDER — ACETAMINOPHEN 650 MG RE SUPP: 650.0000 mg | Freq: Four times a day (QID) | RECTAL | Status: DC | PRN

## 2018-08-02 MED ORDER — POLYETHYLENE GLYCOL 3350 17 G PO PACK: 17.0000 g | PACK | Freq: Every day | ORAL | Status: DC | PRN

## 2018-08-02 MED ORDER — ONDANSETRON HCL 4 MG/2ML IJ SOLN: 4.0000 mg | Freq: Four times a day (QID) | INTRAMUSCULAR | Status: DC | PRN

## 2018-08-02 MED ORDER — SODIUM CHLORIDE 0.9 % IV BOLUS
1000.0000 mL | Freq: Once | INTRAVENOUS | Status: AC
  Administered 2018-08-02: 1000 mL via INTRAVENOUS

## 2018-08-02 MED ORDER — HYDROCODONE-ACETAMINOPHEN 5-325 MG PO TABS: 1.0000 | ORAL_TABLET | ORAL | Status: DC | PRN

## 2018-08-02 MED ORDER — SODIUM CHLORIDE 0.9 % IV SOLN
8.0000 mg/h | INTRAVENOUS | Status: DC
  Administered 2018-08-02 (×2): 8 mg/h via INTRAVENOUS
  Filled 2018-08-02 (×2): qty 80

## 2018-08-02 MED ORDER — PANTOPRAZOLE SODIUM 40 MG IV SOLR: 40.0000 mg | Freq: Two times a day (BID) | INTRAVENOUS | Status: DC

## 2018-08-02 MED ORDER — ONDANSETRON HCL 4 MG PO TABS: 4.0000 mg | ORAL_TABLET | Freq: Four times a day (QID) | ORAL | Status: DC | PRN

## 2018-08-02 MED ORDER — ACETAMINOPHEN 325 MG PO TABS: 650.0000 mg | ORAL_TABLET | Freq: Four times a day (QID) | ORAL | Status: DC | PRN

## 2018-08-02 MED ORDER — SODIUM CHLORIDE 0.9 % IV SOLN
80.0000 mg | Freq: Once | INTRAVENOUS | Status: AC
  Administered 2018-08-02: 80 mg via INTRAVENOUS
  Filled 2018-08-02: qty 80

## 2018-08-02 MED ORDER — PANTOPRAZOLE SODIUM 40 MG IV SOLR
40.0000 mg | Freq: Once | INTRAVENOUS | Status: AC
  Administered 2018-08-02: 40 mg via INTRAVENOUS
  Filled 2018-08-02: qty 40

## 2018-08-02 NOTE — ED Provider Notes (Signed)
[facility] Emergency Department Provider Note   ____________________________________________    I have reviewed the triage vital signs and the nursing notes.   HISTORY  Chief Complaint GI Bleeding     HPI Garrett Hunter is a 72 y.o. male with a history of AAA, CAD who presents with complaints of dark stools.  Patient reports he first noticed a dark, almost black stool on Sunday, 2 days ago.  Yesterday had additional multiple dark stools, reports lightheadedness, does feel somewhat winded with exertion, more so than usual.  Denies fevers or chills.  Had some abdominal bloating yesterday but resolved, no abdominal discomfort today.  No cough or chest pain.  Saw his PCP today who discovered 3 g drop in hemoglobin and referred to the emergency department  Past Medical History:  Diagnosis Date  . AAA (abdominal aortic aneurysm) ([place])    Small noted during cardiac catheterization in 2011.  [name] Benign neoplasm of colon   . CHF (congestive heart failure) ([place])   . Chronic airway obstruction, not elsewhere classified   . Coronary artery disease    Inferior MI in 1996 treated with TPA. Cardiac cath in 2011 at [facility] showed chronic occlusion of the proximal RCA and proximal left circumflex without obstructive disease in the LAD. Non-ST elevation myocardial infarction in October 2013 while on vacation in [place]. Cardiac catheterization showed plaque rupture in the proximal ramus with thrombus. He had balloon angioplasty done.   . Coronary atherosclerosis of native coronary artery   . Hepatitis A   . Hyperlipidemia    Intolerance to statins.  . Hypertension   . MI (myocardial infarction) [facility])     Patient Active Problem List   Diagnosis Date Noted  . Erectile dysfunction 07/01/2015  . Chronic systolic heart failure ([place]) 08/04/2012  . Coronary artery disease   . Hyperlipidemia   . AAA (abdominal aortic aneurysm) [facility])     Past Surgical History:    Procedure Laterality Date  . ABDOMINAL AORTIC ANEURYSM REPAIR     11/16/2016  . ANGIOPLASTY    . CARDIAC CATHETERIZATION  2011  . CARDIAC CATHETERIZATION  2/14   [facility]; 95% lesion in the ramus intermedius.   . CORONARY ANGIOPLASTY  1996   [facility] x1  . CORONARY ANGIOPLASTY  2/14   Severe restenosis in the ostial ramus. Status post angioplasty and drug-eluting stent placement with a 2.5 x 18 mm Xience drug-eluting stent  . TONSILLECTOMY AND ADENOIDECTOMY      Prior to Admission medications   Medication Sig Start Date End Date Taking? Authorizing Provider  aspirin EC 81 MG tablet Take 1 tablet (81 mg total) by mouth daily. 12/12/12  Yes Wellington Hampshire, MD  carvedilol (COREG) 12.5 MG tablet Take 1 tablet (12.5 mg total) by mouth 2 (two) times daily. 09/02/17  Yes Wellington Hampshire, MD  clopidogrel (PLAVIX) 75 MG tablet Take 1 tablet (75 mg total) by mouth daily. 12/07/17  Yes Wellington Hampshire, MD  Coenzyme Q10 (COQ-10) 100 MG CAPS Take by mouth daily.   Yes [provider]  cyanocobalamin (TH VITAMIN B12) 100 MCG tablet Take 100 mcg by mouth daily.   Yes [provider]  folic acid (FOLVITE) 528 MCG tablet Take 800 mcg by mouth daily.   Yes [provider]  KRILL OIL PO Take by mouth daily.   Yes [provider]  lisinopril-hydrochlorothiazide (PRINZIDE,ZESTORETIC) 20-12.5 MG tablet Take 2 tablets by mouth daily.  05/16/18  Yes [provider]  rosuvastatin (CRESTOR) 5 MG tablet TAKE 1 TABLET DAILY 10/11/17  Yes Wellington Hampshire, MD  acetaminophen (TYLENOL) 325 MG tablet Take 650 mg by mouth as needed. 11/18/16   [provider]  nitroGLYCERIN (NITROSTAT) 0.4 MG SL tablet Place 0.4 mg under the tongue every 5 (five) minutes as needed for chest pain.    [provider]  Saw Palmetto 160 MG CAPS Take 160 mg by mouth daily.    [provider]  vitamin C (ASCORBIC ACID) 500 MG tablet Take 500 mg by mouth daily.    [provider]     Allergies Crestor [rosuvastatin]; Livalo [pitavastatin]; Pravachol [pravastatin sodium]; and Zocor [simvastatin]  Family History  Problem Relation Age of Onset  . Cancer Mother 34       stomach  . Heart attack Father   . Hypertension Sister   . Hypertension Brother     Social History Social History   Tobacco Use  . Smoking status: Former Smoker    Packs/day: 2.00    Years: 40.00    Pack years: 80.00    Types: Cigarettes  . Smokeless tobacco: Former Systems developer    Types: Chew  Substance Use Topics  . Alcohol use: Yes    Comment: occassionally  . Drug use: No    Review of Systems  Constitutional: No fever/chills Eyes: No visual changes.  ENT: No sore throat. Cardiovascular: As above Respiratory: As above Gastrointestinal: As above Genitourinary: Negative for dysuria. Musculoskeletal: Negative for back pain. Skin: Negative for rash. Neurological: Negative for headaches   ____________________________________________   PHYSICAL EXAM:  VITAL SIGNS: ED Triage Vitals  Enc Vitals Group     BP 08/02/18 1144 (!) 107/40     Pulse Rate 08/02/18 1144 83     Resp 08/02/18 1144 18     Temp 08/02/18 1144 98.2 F (36.8 C)     Temp Source 08/02/18 1144 Oral     SpO2 08/02/18 1144 100 %     Weight 08/02/18 1145 103 kg (227 lb)     Height 08/02/18 1145 1.803 m (5\' 11" )     Head Circumference --      Peak Flow --      Pain Score 08/02/18 1145 0     Pain Loc --      Pain Edu? --      Excl. in [place]? --     Constitutional: Alert and oriented.  Nose: No congestion/rhinnorhea. Mouth/Throat: Mucous membranes are moist.    Cardiovascular: Normal rate, regular rhythm. Grossly normal heart sounds.  Good peripheral circulation. Respiratory: Normal respiratory effort.  No retractions. Lungs CTAB. Gastrointestinal: Soft and nontender. No distention.  No pulsatile mass, no bruit, guaiac positive, black stool  Musculoskeletal:   Warm and well perfused Neurologic:   Normal speech and language. No gross focal neurologic deficits are appreciated.  Skin:  Skin is warm, dry and intact. No rash noted. Psychiatric: Mood and affect are normal. Speech and behavior are normal.  ____________________________________________   LABS (all labs ordered are listed, but only abnormal results are displayed)  Labs Reviewed  COMPREHENSIVE METABOLIC PANEL - Abnormal; Notable for the following components:      Result Value   Glucose, Bld 121 (*)    BUN 66 (*)    Creatinine, Ser 1.44 (*)    Calcium 8.8 (*)    GFR calc non Af Amer 48 (*)    GFR calc Af Amer 55 (*)  All other components within normal limits  CBC - Abnormal; Notable for the following components:   WBC 14.3 (*)    RBC 3.15 (*)    Hemoglobin 10.3 (*)    HCT 31.5 (*)    All other components within normal limits  PROTIME-INR  APTT  POC OCCULT BLOOD, ED  TYPE AND SCREEN   ____________________________________________  EKG  None ____________________________________________  RADIOLOGY  None ____________________________________________   PROCEDURES  Procedure(s) performed: No  Procedures   Critical Care performed: No ____________________________________________   INITIAL IMPRESSION / ASSESSMENT AND PLAN / ED COURSE  Pertinent labs & imaging results that were available during my care of the patient were reviewed by me and considered in my medical decision making (see chart for details).  Patient presents with dark stools x2 to 3 days, positive guaiac, dark stool.  On aspirin/Plavix.  Hemoglobin is 10, vitals are stable.  Will require admission for GI bleed/further evaluation    ____________________________________________   FINAL CLINICAL IMPRESSION(S) / ED DIAGNOSES  Final diagnoses:  Gastrointestinal hemorrhage, unspecified gastrointestinal hemorrhage type        Note:  This document was prepared using Dragon voice recognition software and may include unintentional  dictation errors.    Lavonia Drafts, MD 08/02/18 [phone]

## 2018-08-02 NOTE — Progress Notes (Signed)
Pharmacy consult for IV PPI BID noted. Pantoprazole 40 mg IV Q12H entered per request. Pharmacy will sign off. Please re-consult if further assistance is desired.  Sander Remedios A. [place], [place].D., BCPS Clinical Pharmacist 08/02/2018 15:51

## 2018-08-02 NOTE — ED Notes (Signed)
Report given on pt at this time

## 2018-08-02 NOTE — Progress Notes (Signed)
Family Meeting Note  Advance Directive:yes  Today a meeting took place with the Patient.and family   The following clinical team members were present during this meeting:MD  The following were discussed:Patient's diagnosis:gib anemia , Patient's progosis: > 12 months and Goals for treatment: Full Code  Additional follow-up to be provided: FULL CODE No change in advanced directives  Time spent during discussion:16 minutes  Vincy Feliz, MD

## 2018-08-02 NOTE — ED Triage Notes (Signed)
Patient reports that he has had abdominal pain and 3 episodes of dark stool. Was seen by PCP this morning and had blood work done showing drop in hemoglobin to 10. Patient reports dizziness and weakness, worsening since Sunday.

## 2018-08-02 NOTE — ED Notes (Signed)
Btittany RN aware of bed assignment

## 2018-08-02 NOTE — H&P (Signed)
[facility] NAME: Garrett Hunter    MR#:  [id number]  DATE OF BIRTH:  [birth year]  DATE OF ADMISSION:  08/02/2018  PRIMARY CARE PHYSICIAN: Derinda Late, MD   REQUESTING/REFERRING PHYSICIAN: dr Corky Downs  CHIEF COMPLAINT:   Dark colored stools HISTORY OF PRESENT ILLNESS:  Garrett Hunter  is a 72 y.o. 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 Patient reports since Sunday he has had dark-colored stools.  He denies chest pain, shortness of breath or lightheadedness.  He denies hematochezia or hematemesis.  He is on aspirin and Plavix which she has been on for several years. He saw his primary care physician today due to the symptoms and it was discovered that he had a three-point drop in his hemoglobin from labs that were performed about 2 weeks ago.  His blood pressure was 107/40 when he arrived in the emergency room.  He is guaiac positive as per ER physician.   PAST MEDICAL HISTORY:   Past Medical History:  Diagnosis Date  . AAA (abdominal aortic aneurysm) ([place])    Small noted during cardiac catheterization in 2011.  [name] Benign neoplasm of colon   . CHF (congestive heart failure) ([place])   . Chronic airway obstruction, not elsewhere classified   . Coronary artery disease    Inferior MI in 1996 treated with TPA. Cardiac cath in [facility] at [facility] showed chronic occlusion of the proximal RCA and proximal left circumflex without obstructive disease in the LAD. Non-ST elevation myocardial infarction in October 2013 while on vacation in [place]. Cardiac catheterization showed plaque rupture in the proximal ramus with thrombus. He had balloon angioplasty done.   . Coronary atherosclerosis of native coronary artery   . Hepatitis A   . Hyperlipidemia     Intolerance to statins.  . Hypertension   . MI (myocardial infarction) ([place])     PAST SURGICAL HISTORY:   Past Surgical History:  Procedure Laterality Date  . ABDOMINAL AORTIC ANEURYSM REPAIR     11/16/2016  . ANGIOPLASTY    . CARDIAC CATHETERIZATION  2011  . CARDIAC CATHETERIZATION  2/14   [facility]; 95% lesion in the ramus intermedius.   . CORONARY ANGIOPLASTY  1996   [facility] x1  . CORONARY ANGIOPLASTY  2/14   Severe restenosis in the ostial ramus. Status post angioplasty and drug-eluting stent placement with a 2.5 x 18 mm Xience drug-eluting stent  . TONSILLECTOMY AND ADENOIDECTOMY      SOCIAL HISTORY:   Social History   Tobacco Use  . Smoking status: Former Smoker    Packs/day: 2.00    Years: 40.00    Pack years: 80.00    Types: Cigarettes  . Smokeless tobacco: Former Systems developer    Types: Chew  Substance Use Topics  . Alcohol use: Yes    Comment: occassionally    FAMILY HISTORY:   Family History  Problem Relation Age of Onset  . Cancer Mother 40       stomach  . Heart attack Father   . Hypertension Sister   . Hypertension Brother     DRUG ALLERGIES:   Allergies  Allergen Reactions  . Crestor [Rosuvastatin]   . Livalo [Pitavastatin]     myalgia  . Pravachol [Pravastatin Sodium]   . Zocor [Simvastatin]  REVIEW OF SYSTEMS:   Review of Systems  Constitutional: Negative.  Negative for chills, fever and malaise/fatigue.  HENT: Negative.  Negative for ear discharge, ear pain, hearing loss, nosebleeds and sore throat.   Eyes: Negative.  Negative for blurred vision and pain.  Respiratory: Negative.  Negative for cough, hemoptysis, shortness of breath and wheezing.   Cardiovascular: Negative.  Negative for chest pain, palpitations and leg swelling.  Gastrointestinal: Positive for abdominal pain and blood in stool. Negative for diarrhea, nausea and vomiting.  Genitourinary: Negative.  Negative for dysuria.  Musculoskeletal: Negative.  Negative for back pain.   Skin: Negative.   Neurological: Negative for dizziness, tremors, speech change, focal weakness, seizures and headaches.  Endo/Heme/Allergies: Negative.  Does not bruise/bleed easily.  Psychiatric/Behavioral: Negative.  Negative for depression, hallucinations and suicidal ideas.    MEDICATIONS AT HOME:   Prior to Admission medications   Medication Sig Start Date End Date Taking? Authorizing Provider  acetaminophen (TYLENOL) 325 MG tablet Take 650 mg by mouth as needed for mild pain or headache.  11/18/16  Yes [provider]  aspirin EC 81 MG tablet Take 1 tablet (81 mg total) by mouth daily. 12/12/12  Yes Wellington Hampshire, MD  carvedilol (COREG) 12.5 MG tablet Take 1 tablet (12.5 mg total) by mouth 2 (two) times daily. 09/02/17  Yes Wellington Hampshire, MD  clopidogrel (PLAVIX) 75 MG tablet Take 1 tablet (75 mg total) by mouth daily. 12/07/17  Yes Wellington Hampshire, MD  Coenzyme Q10 (COQ-10) 100 MG CAPS Take 100 mg by mouth daily.    Yes [provider]  folic acid (FOLVITE) 419 MCG tablet Take 800 mcg by mouth daily.   Yes [provider]  Javier Docker Oil 1000 MG CAPS Take 1,000 mg by mouth daily.    Yes [provider]  lisinopril-hydrochlorothiazide (PRINZIDE,ZESTORETIC) 20-12.5 MG tablet Take 2 tablets by mouth daily.  05/16/18  Yes [provider]  nitroGLYCERIN (NITROSTAT) 0.4 MG SL tablet Place 0.4 mg under the tongue every 5 (five) minutes as needed for chest pain.   Yes [provider]  rosuvastatin (CRESTOR) 5 MG tablet TAKE 1 TABLET DAILY Patient taking differently: Take 5 mg by mouth daily.  10/11/17  Yes Wellington Hampshire, MD  vitamin B-12 (CYANOCOBALAMIN) 1000 MCG tablet Take 1,000 mcg by mouth daily.   Yes [provider]  omeprazole (PRILOSEC) 20 MG capsule Take 20 mg by mouth daily. 08/02/18   [provider]      VITAL SIGNS:  Blood pressure (!) 104/52, pulse 60, temperature 98.2 F (36.8 C), temperature source  Oral, resp. rate 16, height 5\' 11"  (1.803 m), weight 103 kg, SpO2 98 %.  PHYSICAL EXAMINATION:   Physical Exam  Constitutional: He is oriented to person, place, and time. No distress.  HENT:  Head: Normocephalic.  Eyes: No scleral icterus.  Neck: Normal range of motion. Neck supple. No JVD present. No tracheal deviation present.  Cardiovascular: Normal rate, regular rhythm and normal heart sounds. Exam reveals no gallop and no friction rub.  No murmur heard. Pulmonary/Chest: Effort normal and breath sounds normal. No respiratory distress. He has no wheezes. He has no rales. He exhibits no tenderness.  Abdominal: Soft. Bowel sounds are normal. He exhibits no distension and no mass. There is no tenderness. There is no rebound and no guarding.  Musculoskeletal: Normal range of motion. He exhibits no edema.  Neurological: He is alert and oriented to person, place, and time.  Skin: Skin  is warm. No rash noted. No erythema.  Psychiatric: Judgment normal.      LABORATORY PANEL:   CBC Recent Labs  Lab 08/02/18 1153  WBC 14.3*  HGB 10.3*  HCT 31.5*  PLT 238   ------------------------------------------------------------------------------------------------------------------  Chemistries  Recent Labs  Lab 08/02/18 1153  NA 141  K 4.0  CL 104  CO2 27  GLUCOSE 121*  BUN 66*  CREATININE 1.44*  CALCIUM 8.8*  AST 19  ALT 20  ALKPHOS 47  BILITOT 0.6   ------------------------------------------------------------------------------------------------------------------  Cardiac Enzymes No results for input(s): TROPONINI in the last 168 hours. ------------------------------------------------------------------------------------------------------------------  RADIOLOGY:  No results found.  EKG:  none  IMPRESSION AND PLAN:   72 year old male with history of CAD and AAA who is on Plavix and aspirin presents from PCP office due to dark-colored stools and three-point drop in  hemoglobin.  1.  Melena: GI consultation for EGD and colonoscopy. Consult placed via epic We will hold aspirin and Plavix Start PPI IV twice daily Follow hemoglobin  2.  Acute blood loss anemia: Patient's hemoglobin does not require transfusion at this time however will monitor closely and transfuse if less than 7 or hemodynamically unstable.   3.  CAD: Hold aspirin, Plavix, I will also hold blood pressure medications due to low blood pressure He is followed by Dr Fletcher Anon if needed.  4.  Essential hypertension: Hold Coreg, lisinopril/HCTZ  5.  Hyperlipidemia: Hold Crestor for now as patient is n.p.o.  All the records are reviewed and case discussed with ED provider. Management plans discussed with the patient and family and they are in agreement  CODE STATUS: Full  TOTAL TIME TAKING CARE OF THIS PATIENT: 50 minutes.    Zaeda Mcferran M.D on 08/02/2018 at 4:24 PM  Between 7am to 6pm - Pager - [phone]  After 6pm go to [email or web address] - password [email or web address] [email or web address]  Office  [phone]  CC: Primary care physician; Derinda Late, MD

## 2018-08-02 NOTE — Consult Note (Signed)
Garrett Darby, MD [address]  [place]  [place], [place] [address]  Main: [phone]  Fax: [phone] Pager: [phone]   Consultation  Referring Provider:     No ref. provider found Primary Care Physician:  Derinda Late, MD Primary Gastroenterologist: None         Reason for Consultation: melena  Date of Admission:  08/02/2018 Date of Consultation:  08/02/2018         HPI:   Garrett Hunter is a 72 y.o. male with a history of AAA, CAD on DAPT who presents with complaints of dark stools.  Patient reports he first noticed a dark, almost black stool on Sunday, 2 days ago.  Yesterday had additional multiple dark stools, reports lightheadedness, does feel somewhat winded with exertion, more so than usual.  Denies fevers or chills.  Had some abdominal bloating and periumbilical discomfort yesterday but resolved, no abdominal discomfort today.  No cough or chest pain.  Saw his PCP today who discovered 3 g drop in hemoglobin and referred to the emergency department.  Hemoglobin dropped from 13.7 on 06/07/2018 to 10.1 today.  MCV normal.  BUN was disproportionately elevated compared to creatinine.  PT/INR normal.  Protonix was started and GI is consulted for further evaluation. Patient's last dose of aspirin and Plavix was today When asked the patient why he is on both aspirin and Plavix, he said his cardiologist wanted him to continue.  He reports having had a stent placement in 2014. He does not take PPI as outpatient  NSAIDs: None  Antiplts/Anticoagulants/Anti thrombotics: Aspirin and Plavix with history of coronary disease  GI Procedures: Reports having had a colonoscopy more than 6 years ago by Dr. Candace Cruise Never had an upper endoscopy  Past Medical History:  Diagnosis Date  . AAA (abdominal aortic aneurysm) ([place])    Small noted during cardiac catheterization in 2011.  [name] Benign neoplasm of colon   . CHF (congestive heart failure) ([place])   . Chronic airway  obstruction, not elsewhere classified   . Coronary artery disease    Inferior MI in 1996 treated with TPA. Cardiac cath in 2011 at [facility] showed chronic occlusion of the proximal RCA and proximal left circumflex without obstructive disease in the LAD. Non-ST elevation myocardial infarction in October 2013 while on vacation in [place]. Cardiac catheterization showed plaque rupture in the proximal ramus with thrombus. He had balloon angioplasty done.   . Coronary atherosclerosis of native coronary artery   . Hepatitis A   . Hyperlipidemia    Intolerance to statins.  . Hypertension   . MI (myocardial infarction) [facility])     Past Surgical History:  Procedure Laterality Date  . ABDOMINAL AORTIC ANEURYSM REPAIR     11/16/2016  . ANGIOPLASTY    . CARDIAC CATHETERIZATION  2011  . CARDIAC CATHETERIZATION  2/14   [facility]; 95% lesion in the ramus intermedius.   . CORONARY ANGIOPLASTY  1996   [facility] x1  . CORONARY ANGIOPLASTY  2/14   Severe restenosis in the ostial ramus. Status post angioplasty and drug-eluting stent placement with a 2.5 x 18 mm Xience drug-eluting stent  . TONSILLECTOMY AND ADENOIDECTOMY      Prior to Admission medications   Medication Sig Start Date End Date Taking? Authorizing Provider  acetaminophen (TYLENOL) 325 MG tablet Take 650 mg by mouth as needed for mild pain or headache.  11/18/16  Yes [provider]  aspirin EC 81 MG tablet Take 1 tablet (81  mg total) by mouth daily. 12/12/12  Yes Wellington Hampshire, MD  carvedilol (COREG) 12.5 MG tablet Take 1 tablet (12.5 mg total) by mouth 2 (two) times daily. 09/02/17  Yes Wellington Hampshire, MD  clopidogrel (PLAVIX) 75 MG tablet Take 1 tablet (75 mg total) by mouth daily. 12/07/17  Yes Wellington Hampshire, MD  Coenzyme Q10 (COQ-10) 100 MG CAPS Take 100 mg by mouth daily.    Yes [provider]  folic acid (FOLVITE) 694 MCG tablet Take 800 mcg by mouth daily.   Yes [provider]  Javier Docker Oil 1000 MG CAPS Take 1,000  mg by mouth daily.    Yes [provider]  lisinopril-hydrochlorothiazide (PRINZIDE,ZESTORETIC) 20-12.5 MG tablet Take 2 tablets by mouth daily.  05/16/18  Yes [provider]  nitroGLYCERIN (NITROSTAT) 0.4 MG SL tablet Place 0.4 mg under the tongue every 5 (five) minutes as needed for chest pain.   Yes [provider]  rosuvastatin (CRESTOR) 5 MG tablet TAKE 1 TABLET DAILY Patient taking differently: Take 5 mg by mouth daily.  10/11/17  Yes Wellington Hampshire, MD  vitamin B-12 (CYANOCOBALAMIN) 1000 MCG tablet Take 1,000 mcg by mouth daily.   Yes [provider]  omeprazole (PRILOSEC) 20 MG capsule Take 20 mg by mouth daily. 08/02/18   [provider]    Family History  Problem Relation Age of Onset  . Cancer Mother 17       stomach  . Heart attack Father   . Hypertension Sister   . Hypertension Brother      Social History   Tobacco Use  . Smoking status: Former Smoker    Packs/day: 2.00    Years: 40.00    Pack years: 80.00    Types: Cigarettes  . Smokeless tobacco: Former Systems developer    Types: Chew  Substance Use Topics  . Alcohol use: Yes    Comment: occassionally  . Drug use: No    Allergies as of 08/02/2018 - Review Complete 08/02/2018  Allergen Reaction Noted  . Crestor [rosuvastatin]  07/14/2012  . Livalo [pitavastatin]  07/14/2012  . Pravachol [pravastatin sodium]  07/14/2012  . Zocor [simvastatin]  07/14/2012    Review of Systems:    All systems reviewed and negative except where noted in HPI.   Physical Exam:  Vital signs in last 24 hours: Temp:  [98.2 F (36.8 C)] 98.2 F (36.8 C) (10/29 1144) Pulse Rate:  [60-83] 61 (10/29 1800) Resp:  [16-18] 16 (10/29 1800) BP: (104-136)/(40-91) 136/83 (10/29 1800) SpO2:  [97 %-100 %] 97 % (10/29 1800) Weight:  [503 kg] 103 kg (10/29 1145)   General:   Pleasant, cooperative in NAD Head:  Normocephalic and atraumatic. Eyes:   No icterus.   Conjunctiva pink. PERRLA. Ears:  Normal  auditory acuity. Neck:  Supple; no masses or thyroidomegaly Lungs: Respirations even and unlabored. Lungs clear to auscultation bilaterally.   No wheezes, crackles, or rhonchi.  Heart:  Regular rate and rhythm;  Without murmur, clicks, rubs or gallops Abdomen:  Soft, nondistended, nontender. Normal bowel sounds. No appreciable masses or hepatomegaly.  No rebound or guarding.  Rectal:  Not performed. Msk:  Symmetrical without gross deformities.  Strength normal Extremities:  Without edema, cyanosis or clubbing. Neurologic:  Alert and oriented x3;  grossly normal neurologically. Skin:  Intact without significant lesions or rashes. Cervical Nodes:  No significant cervical adenopathy. Psych:  Alert and cooperative. Normal affect.  LAB RESULTS: CBC Latest Ref Rng & Units  08/02/2018 08/02/2018 11/21/2012  WBC 4.0 - 10.5 K/uL - 14.3(H) 8.5  Hemoglobin 13.0 - 17.0 g/dL 9.3(L) 10.3(L) 13.7  Hematocrit 39.0 - 52.0 % - 31.5(L) 40.7  Platelets 150 - 400 K/uL - 238 262    BMET BMP Latest Ref Rng & Units 08/02/2018 01/20/2016 11/25/2012  Glucose 70 - 99 mg/dL 121(H) - 99  BUN 8 - 23 mg/dL 66(H) - 13  Creatinine 0.61 - 1.24 mg/dL 1.44(H) 1.10 0.92  BUN/Creat Ratio 10 - 22 - - -  Sodium 135 - 145 mmol/L 141 - 141  Potassium 3.5 - 5.1 mmol/L 4.0 - 4.6  Chloride 98 - 111 mmol/L 104 - 106  CO2 22 - 32 mmol/L 27 - 29  Calcium 8.9 - 10.3 mg/dL 8.8(L) - 9.1    LFT Hepatic Function Latest Ref Rng & Units 08/02/2018 09/13/2012  Total Protein 6.5 - 8.1 g/dL 6.8 6.4  Albumin 3.5 - 5.0 g/dL 4.2 4.5  AST 15 - 41 U/L 19 24  ALT 0 - 44 U/L 20 26  Alk Phosphatase 38 - 126 U/L 47 83  Total Bilirubin 0.3 - 1.2 mg/dL 0.6 0.3  Bilirubin, Direct 0.00 - 0.40 mg/dL - 0.11     STUDIES: No results found.    Impression / Plan:   Garrett Hunter is a 72 y.o. male with history of coronary disease on DAPT admitted with 2 days history of melena, presyncope, acute drop in hemoglobin. No evidence of chronic  liver disease Melena: Likely upper GI bleed in setting of dual antiplatelet therapy Peptic ulcer disease or malignancy or AVMs or proximal colonic bleed Start Protonix drip Okay with clear liquid diet Monitor hemoglobin closely and transfuse if hemoglobin drops less than 8 EGD tomorrow N.p.o. past midnight Recommend cardiology consult to determine the need for long-term dual antiplatelet therapy  I have discussed alternative options, risks & benefits,  which include, but are not limited to, bleeding, infection, perforation,respiratory complication & drug reaction.  The patient agrees with this plan & written consent will be obtained.    Thank you for involving me in the care of this patient.      LOS: 0 days   Sherri Sear, MD  08/02/2018, 6:59 PM   Note: This dictation was prepared with Dragon dictation along with smaller phrase technology. Any transcriptional errors that result from this process are unintentional.

## 2018-08-03 ENCOUNTER — Inpatient Hospital Stay: Payer: Medicare Other | Admitting: Anesthesiology

## 2018-08-03 ENCOUNTER — Encounter: Payer: Self-pay | Admitting: Anesthesiology

## 2018-08-03 ENCOUNTER — Encounter
Admission: EM | Disposition: A | Discharge status: Home / Self Care | Payer: Self-pay | Source: Home / Self Care | Attending: Internal Medicine

## 2018-08-03 DIAGNOSIS — D62 Acute posthemorrhagic anemia: Secondary | ICD-10-CM | POA: Diagnosis not present

## 2018-08-03 DIAGNOSIS — K259 Gastric ulcer, unspecified as acute or chronic, without hemorrhage or perforation: Secondary | ICD-10-CM

## 2018-08-03 DIAGNOSIS — K254 Chronic or unspecified gastric ulcer with hemorrhage: Secondary | ICD-10-CM | POA: Diagnosis not present

## 2018-08-03 DIAGNOSIS — K449 Diaphragmatic hernia without obstruction or gangrene: Secondary | ICD-10-CM

## 2018-08-03 DIAGNOSIS — K3189 Other diseases of stomach and duodenum: Secondary | ICD-10-CM

## 2018-08-03 DIAGNOSIS — K921 Melena: Secondary | ICD-10-CM | POA: Diagnosis not present

## 2018-08-03 HISTORY — PX: ESOPHAGOGASTRODUODENOSCOPY: SHX5428

## 2018-08-03 LAB — CBC
HCT: 24.5 % — ABNORMAL LOW (ref 39.0–52.0)
Hemoglobin: 8 g/dL — ABNORMAL LOW (ref 13.0–17.0)
MCH: 32.5 pg (ref 26.0–34.0)
MCHC: 32.7 g/dL (ref 30.0–36.0)
MCV: 99.6 fL (ref 80.0–100.0)
Platelets: 188 10*3/uL (ref 150–400)
RBC: 2.46 MIL/uL — ABNORMAL LOW (ref 4.22–5.81)
RDW: 13.6 % (ref 11.5–15.5)
WBC: 9.7 10*3/uL (ref 4.0–10.5)
nRBC: 0 % (ref 0.0–0.2)

## 2018-08-03 LAB — BASIC METABOLIC PANEL
Anion gap: 7 (ref 5–15)
BUN: 49 mg/dL — ABNORMAL HIGH (ref 8–23)
CO2: 25 mmol/L (ref 22–32)
Calcium: 8.2 mg/dL — ABNORMAL LOW (ref 8.9–10.3)
Chloride: 107 mmol/L (ref 98–111)
Creatinine, Ser: 1.12 mg/dL (ref 0.61–1.24)
GFR calc Af Amer: >60 mL/min (ref >60)
GFR calc non Af Amer: >60 mL/min (ref >60)
Glucose, Bld: 94 mg/dL (ref 70–99)
Potassium: 3.6 mmol/L (ref 3.5–5.1)
Sodium: 139 mmol/L (ref 135–145)

## 2018-08-03 LAB — VITAMIN B12: Vitamin B-12: 296 pg/mL (ref 180–914)

## 2018-08-03 LAB — HEMOGLOBIN
Hemoglobin: 8 g/dL — ABNORMAL LOW (ref 13.0–17.0)
Hemoglobin: 8.4 g/dL — ABNORMAL LOW (ref 13.0–17.0)

## 2018-08-03 SURGERY — EGD (ESOPHAGOGASTRODUODENOSCOPY)
Anesthesia: General

## 2018-08-03 MED ORDER — CYANOCOBALAMIN 1000 MCG/ML IJ SOLN
1000.0000 ug | Freq: Once | INTRAMUSCULAR | Status: AC
  Administered 2018-08-03: 09:00:00 1000 ug via INTRAMUSCULAR
  Filled 2018-08-03: qty 1

## 2018-08-03 MED ORDER — SODIUM CHLORIDE 0.9 % IV SOLN
200.0000 mg | Freq: Once | INTRAVENOUS | Status: AC
  Administered 2018-08-03: 200 mg via INTRAVENOUS
  Filled 2018-08-03: qty 10

## 2018-08-03 MED ORDER — PROPOFOL 10 MG/ML IV BOLUS
INTRAVENOUS | Status: DC | PRN
  Administered 2018-08-03: 100 mg via INTRAVENOUS

## 2018-08-03 MED ORDER — PROPOFOL 500 MG/50ML IV EMUL
INTRAVENOUS | Status: AC
  Filled 2018-08-03: qty 50

## 2018-08-03 MED ORDER — LIDOCAINE HCL (CARDIAC) PF 100 MG/5ML IV SOSY
PREFILLED_SYRINGE | INTRAVENOUS | Status: DC | PRN
  Administered 2018-08-03: 50 mg via INTRAVENOUS

## 2018-08-03 MED ORDER — SODIUM CHLORIDE 0.9 % IV SOLN
INTRAVENOUS | Status: DC
  Administered 2018-08-03: 1000 mL via INTRAVENOUS

## 2018-08-03 MED ORDER — LIDOCAINE HCL (PF) 2 % IJ SOLN
INTRAMUSCULAR | Status: AC
  Filled 2018-08-03: qty 10

## 2018-08-03 MED ORDER — PROPOFOL 500 MG/50ML IV EMUL
INTRAVENOUS | Status: DC | PRN
  Administered 2018-08-03: 135 ug/kg/min via INTRAVENOUS

## 2018-08-03 MED ORDER — PANTOPRAZOLE SODIUM 40 MG PO TBEC: 40.0000 mg | DELAYED_RELEASE_TABLET | Freq: Two times a day (BID) | ORAL | 0 refills | Status: DC

## 2018-08-03 NOTE — Discharge Summary (Signed)
[facility] [place] NAME: Garrett Hunter    MR#:  [id number]  DATE OF BIRTH:  September 30, 1946  DATE OF ADMISSION:  08/02/2018 ADMITTING PHYSICIAN: Bettey Costa, MD  DATE OF DISCHARGE: 08/03/2018  PRIMARY CARE PHYSICIAN: Derinda Late, MD    ADMISSION DIAGNOSIS:  Gastrointestinal hemorrhage, unspecified gastrointestinal hemorrhage type [K92.2]  DISCHARGE DIAGNOSIS:  Active Problems:   GIB (gastrointestinal bleeding)   SECONDARY DIAGNOSIS:   Past Medical History:  Diagnosis Date  . AAA (abdominal aortic aneurysm) ([place])    Small noted during cardiac catheterization in 2011.  [name] Benign neoplasm of colon   . CHF (congestive heart failure) ([place])   . Chronic airway obstruction, not elsewhere classified   . Coronary artery disease    Inferior MI in 1996 treated with TPA. Cardiac cath in 2011 at [facility] showed chronic occlusion of the proximal RCA and proximal left circumflex without obstructive disease in the LAD. Non-ST elevation myocardial infarction in October 2013 while on vacation in [place]. Cardiac catheterization showed plaque rupture in the proximal ramus with thrombus. He had balloon angioplasty done.   . Coronary atherosclerosis of native coronary artery   . Hepatitis A   . Hyperlipidemia    Intolerance to statins.  . Hypertension   . MI (myocardial infarction) [facility])     HOSPITAL COURSE:   1.  Acute blood loss anemia with melena and upper GI bleed.  The patient had an endoscopy which showed a gastric ulcer that was cauterized.  Patient will need follow-up with GI in 1 month to ensure ulcer is healed.  Protonix 40 mg twice daily recommended and E prescribed and to pharmacy.  Aspirin will be held for a week then can be restarted 81 mg daily.  Patient will speak with his cardiologist about whether or not he can get rid of the Plavix altogether since his last cardiac stent was in 2014.  I will hold this medication at this point.  I did give  IV iron.  Hemoglobin was 10.3 then went on 9.3 then 8.0 and prior to discharge 8.4. 2.  B12 deficiency-IM B12 given here.  Can consider IM B12 shots with his medical doctor.  He is already on oral supplementation 3.  History of coronary artery disease.  Hold aspirin and Plavix for now.  Follow-up with Dr. Velva Harman as outpatient.  Can restart aspirin in 1 week. 4.  Essential hypertension restart Coreg lisinopril HCT as outpatient 5.  Hyperlipidemia unspecified on Crestor can restart.  DISCHARGE CONDITIONS:  Satisfactory  CONSULTS OBTAINED:  Treatment Team:  Lin Landsman, MD  DRUG ALLERGIES:   Allergies  Allergen Reactions  . Crestor [Rosuvastatin]   . Livalo [Pitavastatin]     myalgia  . Pravachol [Pravastatin Sodium]   . Zocor [Simvastatin]     DISCHARGE MEDICATIONS:   Allergies as of 08/03/2018      Reactions   Crestor [rosuvastatin]    Livalo [pitavastatin]    myalgia   Pravachol [pravastatin Sodium]    Zocor [simvastatin]       Medication List    STOP taking these medications   aspirin EC 81 MG tablet   clopidogrel 75 MG tablet Commonly known as:  PLAVIX   omeprazole 20 MG capsule Commonly known as:  PRILOSEC     TAKE these medications   acetaminophen 325 MG tablet Commonly known as:  TYLENOL Take 650 mg by mouth as needed for mild pain or headache.   carvedilol 12.5 MG  tablet Commonly known as:  COREG Take 1 tablet (12.5 mg total) by mouth 2 (two) times daily.   CoQ-10 100 MG Caps Take 100 mg by mouth daily.   folic acid 517 MCG tablet Commonly known as:  FOLVITE Take 800 mcg by mouth daily.   Krill Oil 1000 MG Caps Take 1,000 mg by mouth daily.   lisinopril-hydrochlorothiazide 20-12.5 MG tablet Commonly known as:  PRINZIDE,ZESTORETIC Take 2 tablets by mouth daily.   nitroGLYCERIN 0.4 MG SL tablet Commonly known as:  NITROSTAT Place 0.4 mg under the tongue every 5 (five) minutes as needed for chest pain.   pantoprazole 40 MG  tablet Commonly known as:  PROTONIX Take 1 tablet (40 mg total) by mouth 2 (two) times daily.   rosuvastatin 5 MG tablet Commonly known as:  CRESTOR TAKE 1 TABLET DAILY   vitamin B-12 1000 MCG tablet Commonly known as:  CYANOCOBALAMIN Take 1,000 mcg by mouth daily.        DISCHARGE INSTRUCTIONS:   Follow-up PMD 5 days Speak with his cardiologist about the Plavix Follow-up GI 1 month  If you experience worsening of your admission symptoms, develop shortness of breath, life threatening emergency, suicidal or homicidal thoughts you must seek medical attention immediately by calling 911 or calling your MD immediately  if symptoms less severe.  You Must read complete instructions/literature along with all the possible adverse reactions/side effects for all the Medicines you take and that have been prescribed to you. Take any new Medicines after you have completely understood and accept all the possible adverse reactions/side effects.   Please note  You were cared for by a hospitalist during your hospital stay. If you have any questions about your discharge medications or the care you received while you were in the hospital after you are discharged, you can call the unit and asked to speak with the hospitalist on call if the hospitalist that took care of you is not available. Once you are discharged, your primary care physician will handle any further medical issues. Please note that NO REFILLS for any discharge medications will be authorized once you are discharged, as it is imperative that you return to your primary care physician (or establish a relationship with a primary care physician if you do not have one) for your aftercare needs so that they can reassess your need for medications and monitor your lab values.    Today   CHIEF COMPLAINT:   Chief Complaint  Patient presents with  . GI Bleeding    HISTORY OF PRESENT ILLNESS:  Garrett Hunter  is a 72 y.o. male presented  with melena and found to be anemic   VITAL SIGNS:  Blood pressure 138/70, pulse 87, temperature 98 F (36.7 C), temperature source Oral, resp. rate 20, height 5\' 11"  (1.803 m), weight 103 kg, SpO2 100 %.   PHYSICAL EXAMINATION:  GENERAL:  72 y.o.-year-old patient lying in the bed with no acute distress.  EYES: Pupils equal, round, reactive to light and accommodation. No scleral icterus. Extraocular muscles intact.  HEENT: Head atraumatic, normocephalic. Oropharynx and nasopharynx clear.  NECK:  Supple, no jugular venous distention. No thyroid enlargement, no tenderness.  LUNGS: Normal breath sounds bilaterally, no wheezing, rales,rhonchi or crepitation. No use of accessory muscles of respiration.  CARDIOVASCULAR: S1, S2 normal. No murmurs, rubs, or gallops.  ABDOMEN: Soft, non-tender, non-distended. Bowel sounds present. No organomegaly or mass.  EXTREMITIES: No pedal edema, cyanosis, or clubbing.  NEUROLOGIC: Cranial nerves II through XII are  intact. Muscle strength 5/5 in all extremities. Sensation intact. Gait not checked.  PSYCHIATRIC: The patient is alert and oriented x 3.  SKIN: No obvious rash, lesion, or ulcer.   DATA REVIEW:   CBC Recent Labs  Lab 08/03/18 0442  08/03/18 1342  WBC 9.7  --   --   HGB 8.0*   < > 8.4*  HCT 24.5*  --   --   PLT 188  --   --    < > = values in this interval not displayed.    Chemistries  Recent Labs  Lab 08/02/18 1153 08/03/18 0442  NA 141 139  K 4.0 3.6  CL 104 107  CO2 27 25  GLUCOSE 121* 94  BUN 66* 49*  CREATININE 1.44* 1.12  CALCIUM 8.8* 8.2*  AST 19  --   ALT 20  --   ALKPHOS 47  --   BILITOT 0.6  --       Management plans discussed with the patient, and he is in agreement.  CODE STATUS:     Code Status Orders  (From admission, onward)         Start     Ordered   08/02/18 1957  Full code  Continuous     08/02/18 1956        Code Status History    This patient has a current code status but no  historical code status.    Advance Directive Documentation     Most Recent Value  Type of Advance Directive  Healthcare Power of Attorney, Living will  Pre-existing out of facility DNR order (yellow form or pink MOST form)  -  "MOST" Form in Place?  -      TOTAL TIME TAKING CARE OF THIS PATIENT: 35 minutes.    Loletha Grayer M.D on 08/03/2018 at 2:45 PM  Between 7am to 6pm - Pager - [phone]  After 6pm go to [email or web address] - password [email or web address] [facility]  [phone]  CC: Primary care physician; Derinda Late, MD

## 2018-08-03 NOTE — Anesthesia Preprocedure Evaluation (Signed)
Anesthesia Evaluation  Patient identified by MRN, date of birth, ID band Patient awake    Reviewed: Allergy & Precautions, NPO status , Patient's Chart, lab work & pertinent test results, reviewed documented beta blocker date and time   Airway Mallampati: II  TM Distance: >3 FB     Dental  (+) Chipped   Pulmonary COPD, former smoker,           Cardiovascular hypertension, Pt. on medications and Pt. on home beta blockers + CAD, + Past MI, + Cardiac Stents and +CHF       Neuro/Psych    GI/Hepatic (+) Hepatitis -, A  Endo/Other    Renal/GU      Musculoskeletal   Abdominal   Peds  Hematology   Anesthesia Other Findings Obese. AAA repair. Hb 8.  Reproductive/Obstetrics                             Anesthesia Physical Anesthesia Plan  ASA: III  Anesthesia Plan: General   Post-op Pain Management:    Induction: Intravenous  PONV Risk Score and Plan:   Airway Management Planned:   Additional Equipment:   Intra-op Plan:   Post-operative Plan:   Informed Consent: I have reviewed the patients History and Physical, chart, labs and discussed the procedure including the risks, benefits and alternatives for the proposed anesthesia with the patient or authorized representative who has indicated his/her understanding and acceptance.     Plan Discussed with: CRNA  Anesthesia Plan Comments:         Anesthesia Quick Evaluation

## 2018-08-03 NOTE — Anesthesia Postprocedure Evaluation (Signed)
Anesthesia Post Note  Patient: Garrett Hunter  Procedure(s) Performed: ESOPHAGOGASTRODUODENOSCOPY (EGD) (N/A )  Patient location during evaluation: Endoscopy Anesthesia Type: General Level of consciousness: awake and alert Pain management: pain level controlled Vital Signs Assessment: post-procedure vital signs reviewed and stable Respiratory status: spontaneous breathing, nonlabored ventilation, respiratory function stable and patient connected to nasal cannula oxygen Cardiovascular status: blood pressure returned to baseline and stable Postop Assessment: no apparent nausea or vomiting Anesthetic complications: no     Last Vitals:  Vitals:   08/03/18 1239 08/03/18 1249  BP: 116/77 131/73  Pulse: 90 85  Resp: 17 20  Temp:    SpO2: 97% 98%    Last Pain:  Vitals:   08/03/18 1249  TempSrc:   PainSc: 0-No pain                 Leibish Mcgregor S

## 2018-08-03 NOTE — Plan of Care (Signed)

## 2018-08-03 NOTE — Transfer of Care (Signed)
Immediate Anesthesia Transfer of Care Note  Patient: Garrett Hunter  Procedure(s) Performed: ESOPHAGOGASTRODUODENOSCOPY (EGD) (N/A )  Patient Location: PACU and Endoscopy Unit  Anesthesia Type:General  Level of Consciousness: drowsy  Airway & Oxygen Therapy: Patient Spontanous Breathing  Post-op Assessment: Report given to RN and Post -op Vital signs reviewed and stable  Post vital signs: Reviewed and stable  Last Vitals:  Vitals Value Taken Time  BP    Temp    Pulse    Resp    SpO2      Last Pain:  Vitals:   08/03/18 1059  TempSrc: Tympanic  PainSc:          Complications: No apparent anesthesia complications

## 2018-08-03 NOTE — Discharge Instructions (Addendum)
Prescribed protonix for gastric ulcer. (if this is not covered with your insurance can use prilosec 40mg  po twice a day) Hold aspirin for one week then can go back on this. Speak with your cardiologist about weather the plavix is still needed since your last stent was in 2014.   Need to hold plavix at least 5 days.     Gastrointestinal Bleeding Gastrointestinal bleeding is bleeding somewhere along the path food travels through the body (digestive tract). This path is anywhere between the mouth and the opening of the butt (anus). You may have blood in your poop (stools) or have black poop. If you throw up (vomit), there may be blood in it. This condition can be mild, serious, or even life-threatening. If you have a lot of bleeding, you may need to stay in the hospital. Follow these instructions at home:  Take over-the-counter and prescription medicines only as told by your doctor.  Eat foods that have a lot of fiber in them. These foods include whole grains, fruits, and vegetables. You can also try eating 1-3 prunes each day.  Drink enough fluid to keep your pee (urine) clear or pale yellow.  Keep all follow-up visits as told by your doctor. This is important. Contact a doctor if:  Your symptoms do not get better. Get help right away if:  Your bleeding gets worse.  You feel dizzy or you pass out (faint).  You feel weak.  You have very bad cramps in your back or belly (abdomen).  You pass large clumps of blood (clots) in your poop.  Your symptoms are getting worse. This information is not intended to replace advice given to you by your health care provider. Make sure you discuss any questions you have with your health care provider. Document Released: 06/30/2008 Document Revised: 02/27/2016 Document Reviewed: 03/11/2015 Elsevier Interactive Patient Education  2018 [employer].

## 2018-08-03 NOTE — Op Note (Addendum)
[facility] Patient Name: Garrett Hunter Procedure Date: 08/03/2018 11:57 AM MRN: [id number] Account #: [id number] Date of Birth: Dec 12, 1945 Admit Type: Inpatient Age: 72 Room: [facility] ENDO ROOM 1 Gender: Male Note Status: Finalized Procedure:            Upper GI endoscopy Indications:          Acute post hemorrhagic anemia, Melena Providers:            Lin Landsman MD, MD Referring MD:         Caprice Renshaw MD (Referring MD) Medicines:            Monitored Anesthesia Care Complications:        No immediate complications. Estimated blood loss: None. Procedure:            Pre-Anesthesia Assessment:                       - Prior to the procedure, a History and Physical was                        performed, and patient medications and allergies were                        reviewed. The patient is competent. The risks and                        benefits of the procedure and the sedation options and                        risks were discussed with the patient. All questions                        were answered and informed consent was obtained.                        Patient identification and proposed procedure were                        verified by the physician, the nurse, the                        anesthesiologist, the anesthetist and the technician in                        the pre-procedure area in the procedure room in the                        endoscopy suite. Mental Status Examination: alert and                        oriented. Airway Examination: normal oropharyngeal                        airway and neck mobility. Respiratory Examination:                        clear to auscultation. CV Examination: normal.                        Prophylactic Antibiotics: The patient does not require  prophylactic antibiotics. Prior Anticoagulants: The                        patient has taken no previous anticoagulant or                       antiplatelet agents. ASA Grade Assessment: III - A                        patient with severe systemic disease. After reviewing                        the risks and benefits, the patient was deemed in                        satisfactory condition to undergo the procedure. The                        anesthesia plan was to use monitored anesthesia care                        (MAC). Immediately prior to administration of                        medications, the patient was re-assessed for adequacy                        to receive sedatives. The heart rate, respiratory rate,                        oxygen saturations, blood pressure, adequacy of                        pulmonary ventilation, and response to care were                        monitored throughout the procedure. The physical status                        of the patient was re-assessed after the procedure.                       After obtaining informed consent, the endoscope was                        passed under direct vision. Throughout the procedure,                        the patient's blood pressure, pulse, and oxygen                        saturations were monitored continuously. The Endoscope                        was introduced through the mouth, and advanced to the                        second part of duodenum. The upper GI endoscopy was  accomplished without difficulty. The patient tolerated                        the procedure well. Findings:      The duodenal bulb and second portion of the duodenum were normal.      One non-bleeding cratered gastric ulcer with a flat pigmented spot       (Forrest Class IIc) was found at the incisura. The lesion was 10 mm in       largest dimension. Fulguration to ablate the lesion to prevent bleeding       by bipolar probe was successful. For hemostasis, one hemostatic clip was       successfully placed (MR conditional). There was no bleeding  during, or       at the end, of the procedure.      Multiple dispersed, small non-bleeding erosions were found at the       incisura and in the gastric antrum. There were no stigmata of recent       bleeding. Biopsies were taken with a cold forceps for Helicobacter       pylori testing.      The cardia and gastric fundus were normal on retroflexion.      A small hiatal hernia was present.      The gastroesophageal junction and examined esophagus were normal. Impression:           - Normal duodenal bulb and second portion of the                        duodenum.                       - Non-bleeding gastric ulcer with a flat pigmented spot                        (Forrest Class IIc). Treated with bipolar cautery. Clip                        (MR conditional) was placed.                       - Non-bleeding erosive gastropathy. Biopsied.                       - Small hiatal hernia.                       - Normal gastroesophageal junction and esophagus. Recommendation:       - Return patient to hospital ward for possible                        discharge same day.                       - Resume previous diet today.                       - Continue present medications.                       - Await pathology results.                       - Use Protonix (pantoprazole)  or Prilosec 40 mg PO BID                        for 3 months.                       - No ibuprofen, naproxen, or other non-steroidal                        anti-inflammatory drugs.                       - Repeat upper endoscopy in 3 months to check healing.                       - Return to my office in 4 weeks.                       - Resume aspirin today and Plavix (clopidogrel) in 3                        days at prior doses. Refer to managing physician for                        further adjustment of therapy. Procedure Code(s):    --- Professional ---                       [phone], 54, Esophagogastroduodenoscopy, flexible,                         transoral; with control of bleeding, any method                       43239, Esophagogastroduodenoscopy, flexible, transoral;                        with biopsy, single or multiple Diagnosis Code(s):    --- Professional ---                       K25.9, Gastric ulcer, unspecified as acute or chronic,                        without hemorrhage or perforation                       K31.89, Other diseases of stomach and duodenum                       K44.9, Diaphragmatic hernia without obstruction or                        gangrene                       D62, Acute posthemorrhagic anemia                       K92.1, Melena (includes Hematochezia) CPT copyright 2018 American Medical Association. All rights reserved. The codes documented in this report are preliminary and upon coder review may  be revised to meet current compliance requirements. Dr. Ulyess Mort Lin Landsman MD, MD 08/03/2018 19:14:78 PM This report has been  signed electronically. Number of Addenda: 0 Note Initiated On: 08/03/2018 11:57 AM      [facility]

## 2018-08-03 NOTE — Anesthesia Post-op Follow-up Note (Signed)
Anesthesia QCDR form completed.        

## 2018-08-04 ENCOUNTER — Encounter: Payer: Self-pay | Admitting: Gastroenterology

## 2018-08-04 LAB — HIV ANTIBODY (ROUTINE TESTING W REFLEX): HIV Screen 4th Generation wRfx: NONREACTIVE

## 2018-08-04 LAB — SURGICAL PATHOLOGY

## 2018-08-16 ENCOUNTER — Other Ambulatory Visit: Payer: Self-pay | Admitting: Cardiovascular Disease

## 2018-09-12 ENCOUNTER — Other Ambulatory Visit: Payer: Self-pay

## 2018-09-12 ENCOUNTER — Ambulatory Visit (INDEPENDENT_AMBULATORY_CARE_PROVIDER_SITE_OTHER): Payer: Medicare Other | Admitting: Gastroenterology

## 2018-09-12 ENCOUNTER — Encounter: Payer: Self-pay | Admitting: Gastroenterology

## 2018-09-12 VITALS — BP 135/66 | HR 85 | Resp 17 | Ht 71.0 in | Wt 234.9 lb

## 2018-09-12 DIAGNOSIS — K279 Peptic ulcer, site unspecified, unspecified as acute or chronic, without hemorrhage or perforation: Secondary | ICD-10-CM

## 2018-09-12 DIAGNOSIS — D62 Acute posthemorrhagic anemia: Secondary | ICD-10-CM

## 2018-09-12 DIAGNOSIS — E538 Deficiency of other specified B group vitamins: Secondary | ICD-10-CM

## 2018-09-12 NOTE — Progress Notes (Signed)
Garrett Darby, MD [address]  [place]  [place],  [address]  Main: [phone]  Fax: [phone]    Gastroenterology Consultation  Referring Provider:     Derinda Late, MD Primary Care Physician:  Garrett Late, MD Primary Gastroenterologist:  Dr. Cephas Hunter Reason for Consultation:     Peptic ulcer disease, hospital follow-up        HPI:   Garrett Hunter is a 72 y.o. male referred by Dr. Derinda Late, MD  for consultation & management of recent diagnosis of upper GI bleed secondary to gastric ulcer.  He has history of AAA, coronary disease on DAPT presented to [facility] on 08/02/2018 secondary to 1 day episode of melena, symptomatic anemia, hemoglobin 10 on admission with baseline 13.7 in 06/2018.  Patient underwent EGD which revealed gastric ulcer with flap admitted spot it was treated with bipolar cautery.  He did not have iron deficiency at that time.  He was discharged home on Protonix 40 mg twice daily with GI follow-up as outpatient.  Hemoglobin was 8.4 on the day of discharge on 08/03/2018.  Patient has history of coronary disease, status post stent placement in 2015, was taking DAPT prior to last admission.  He was not taking PPI as outpatient  Interval summary He denies any complaints today.  His energy levels are good.  No longer having black stools.  He took Protonix 40 twice daily for a month and ran out of prescription.  He restarted DAPT few days after being discharged from the hospital.  He does not have follow-up with cardiologist yet.  NSAIDs: None  Antiplts/Anticoagulants/Anti thrombotics: On DAPT for history of coronary artery disease until 08/01/2018.  GI Procedures:  Reports having had a colonoscopy more than 6 years ago by Dr. Candace Hunter Report not available  EGD 08/03/2018 - Normal duodenal bulb and second portion of the duodenum. - Non-bleeding gastric ulcer with a flat pigmented spot (Forrest Class IIc). Treated with bipolar cautery.  Clip (MR conditional) was placed. - Non-bleeding erosive gastropathy. Biopsied. - Small hiatal hernia. - Normal gastroesophageal junction and esophagus. DIAGNOSIS:  A. STOMACH, RANDOM COLD BIOPSY:  - ANTRAL AND OXYNTIC MUCOSA WITH MILD REACTIVE GASTROPATHY AND EDEMA.  - NEGATIVE FOR ACTIVE INFLAMMATION, H. PYLORI, INTESTINAL METAPLASIA,  DYSPLASIA, AND MALIGNANCY.    Past Medical History:  Diagnosis Date  . AAA (abdominal aortic aneurysm) ([place])    Small noted during cardiac catheterization in 2011.  [name] Benign neoplasm of colon   . CHF (congestive heart failure) ([place])   . Chronic airway obstruction, not elsewhere classified   . Coronary artery disease    Inferior MI in 1996 treated with TPA. Cardiac cath in 2011 at [facility] showed chronic occlusion of the proximal RCA and proximal left circumflex without obstructive disease in the LAD. Non-ST elevation myocardial infarction in October 2013 while on vacation in [place]. Cardiac catheterization showed plaque rupture in the proximal ramus with thrombus. He had balloon angioplasty done.   . Coronary atherosclerosis of native coronary artery   . Hepatitis A   . Hyperlipidemia    Intolerance to statins.  . Hypertension   . MI (myocardial infarction) [facility])     Past Surgical History:  Procedure Laterality Date  . ABDOMINAL AORTIC ANEURYSM REPAIR     11/16/2016  . ANGIOPLASTY    . CARDIAC CATHETERIZATION  2011  . CARDIAC CATHETERIZATION  2/14   [facility]; 95% lesion in the ramus intermedius.   . [place]  [facility] x1  . CORONARY ANGIOPLASTY  2/14   Severe restenosis in the ostial ramus. Status post angioplasty and drug-eluting stent placement with a 2.5 x 18 mm Xience drug-eluting stent  . ESOPHAGOGASTRODUODENOSCOPY N/A 08/03/2018   Procedure: ESOPHAGOGASTRODUODENOSCOPY (EGD);  Surgeon: Garrett Landsman, MD;  Location: [facility] ENDOSCOPY;  Service: Gastroenterology;  Laterality: N/A;  . TONSILLECTOMY AND ADENOIDECTOMY       Current Outpatient Medications:  .  acetaminophen (TYLENOL) 325 MG tablet, Take 650 mg by mouth as needed for mild pain or headache. , Disp: , Rfl:  .  carvedilol (COREG) 12.5 MG tablet, Take 1 tablet (12.5 mg total) by mouth 2 (two) times daily., Disp: 14 tablet, Rfl: 0 .  Coenzyme Q10 (COQ-10) 100 MG CAPS, Take 100 mg by mouth daily. , Disp: , Rfl:  .  folic acid (FOLVITE) 785 MCG tablet, Take 800 mcg by mouth daily., Disp: , Rfl:  .  Krill Oil 1000 MG CAPS, Take 1,000 mg by mouth daily. , Disp: , Rfl:  .  lisinopril-hydrochlorothiazide (PRINZIDE,ZESTORETIC) 20-12.5 MG tablet, Take 2 tablets by mouth daily. , Disp: , Rfl:  .  nitroGLYCERIN (NITROSTAT) 0.4 MG SL tablet, Place 0.4 mg under the tongue every 5 (five) minutes as needed for chest pain., Disp: , Rfl:  .  pantoprazole (PROTONIX) 40 MG tablet, Take 1 tablet (40 mg total) by mouth 2 (two) times daily., Disp: 60 tablet, Rfl: 0 .  rosuvastatin (CRESTOR) 5 MG tablet, TAKE 1 TABLET DAILY (Patient taking differently: Take 5 mg by mouth daily. ), Disp: 90 tablet, Rfl: 3 .  vitamin B-12 (CYANOCOBALAMIN) 1000 MCG tablet, Take 1,000 mcg by mouth daily., Disp: , Rfl:    Family History  Problem Relation Age of Onset  . Cancer Mother 12       stomach  . Heart attack Father   . Hypertension Sister   . Hypertension Brother      Social History   Tobacco Use  . Smoking status: Former Smoker    Packs/day: 2.00    Years: 40.00    Pack years: 80.00    Types: Cigarettes  . Smokeless tobacco: Former Systems developer    Types: Chew  Substance Use Topics  . Alcohol use: Yes    Comment: occassionally  . Drug use: No    Allergies as of 09/12/2018 - Review Complete 09/12/2018  Allergen Reaction Noted  . Crestor [rosuvastatin]  07/14/2012  . Livalo [pitavastatin]  07/14/2012  . Pravachol [pravastatin sodium]  07/14/2012  . Zocor [simvastatin]  07/14/2012    Review of Systems:    All systems reviewed and negative except where noted in HPI.    Physical Exam:  BP 135/66 (BP Location: Left Arm, Patient Position: Sitting, Cuff Size: Large)   Pulse 85   Resp 17   Ht 5\' 11"  (1.803 m)   Wt 234 lb 14.4 oz (106.5 kg)   BMI 32.76 kg/m  No LMP for male patient.  General:   Alert,  Well-developed, well-nourished, pleasant and cooperative in NAD Head:  Normocephalic and atraumatic. Eyes:  Sclera clear, no icterus.   Conjunctiva pink. Ears:  Normal auditory acuity. Nose:  No deformity, discharge, or lesions. Mouth:  No deformity or lesions,oropharynx pink & moist. Neck:  Supple; no masses or thyromegaly. Lungs:  Respirations even and unlabored.  Clear throughout to auscultation.   No wheezes, crackles, or rhonchi. No acute distress. Heart:  Regular rate and rhythm; no murmurs, clicks, rubs, or gallops. Abdomen:  Normal bowel sounds.  Soft, non-tender and non-distended without masses, hepatosplenomegaly or hernias noted.  No guarding or rebound tenderness.   Rectal: Not performed Msk:  Symmetrical without gross deformities. Good, equal movement & strength bilaterally. Pulses:  Normal pulses noted. Extremities:  No clubbing or edema.  No cyanosis. Neurologic:  Alert and oriented x3;  grossly normal neurologically. Skin:  Intact without significant lesions or rashes. No jaundice. Lymph Nodes:  No significant cervical adenopathy. Psych:  Alert and cooperative. Normal mood and affect.  Imaging Studies: Reviewed  Assessment and Plan:   Garrett Hunter is a 72 y.o. Caucasian male with AAA, coronary disease status post PCI in 2014 on DAPT here for hospital follow-up of recent upper GI bleed secondary to gastric ulcer in setting of dual antiplatelet therapy.  There is no evidence of H. pylori on biopsies.  He finished 1 month course of Protonix twice daily.  Currently asymptomatic  History of gastric ulcer Restart Protonix 40 mg twice daily Schedule EGD to confirm healing of the gastric ulcer Recheck CBC, iron studies, B12  levels Advised him to follow-up with his cardiologist about long-term DAPT since his stent was in 2014  Colon cancer screening Previous colonoscopy by Dr. Candace Hunter more than 5 years ago Obtain colonoscopy report to determine surveillance interval   Follow up based on the EGD results   Garrett Darby, MD

## 2018-09-13 LAB — CBC
Hematocrit: 36 % — ABNORMAL LOW (ref 37.5–51.0)
Hemoglobin: 12.1 g/dL — ABNORMAL LOW (ref 13.0–17.7)
MCH: 32.3 pg (ref 26.6–33.0)
MCHC: 33.6 g/dL (ref 31.5–35.7)
MCV: 96 fL (ref 79–97)
Platelets: 296 10*3/uL (ref 150–450)
RBC: 3.75 x10E6/uL — ABNORMAL LOW (ref 4.14–5.80)
RDW: 13.1 % (ref 12.3–15.4)
WBC: 8.4 10*3/uL (ref 3.4–10.8)

## 2018-09-13 LAB — VITAMIN B12: Vitamin B-12: 1155 pg/mL (ref 232–1245)

## 2018-09-13 LAB — IRON AND TIBC
Iron Saturation: 31 % (ref 15–55)
Iron: 102 ug/dL (ref 38–169)
Total Iron Binding Capacity: 334 ug/dL (ref 250–450)
UIBC: 232 ug/dL (ref 111–343)

## 2018-09-13 LAB — FERRITIN: Ferritin: 50 ng/mL (ref 30–400)

## 2018-09-16 ENCOUNTER — Telehealth: Payer: Self-pay | Admitting: Cardiovascular Disease

## 2018-09-16 NOTE — Telephone Encounter (Signed)
Given that he had GI bleed, it is probably best to keep him off Plavix and continue aspirin 81 mg once daily.

## 2018-09-16 NOTE — Telephone Encounter (Signed)
The patient called back to the office.  He called to clarify that at the end of October he was admitted for a GI bleed and that his Hgb went down to 8.1. He was discharged on 08/03/18 and told to resume his aspirin and plavix 5 days after discharge.  The patient states he saw Dr. Marius Ditch (GI) this week and the patient is planned to have a repeat endoscopy on 11/03/18. Dr. Marius Ditch asked the patient to call the office to confirm if he still needed to be on ASA and plavix.  I advised the patient of Dr. Tyrell Antonio recommendations from 06/30/18 and that I will need to review with MD for further recommendations.  The has been taking dual antiplatelet therapy. He ran out of plavix yesterday but is still on ASA.   Labs reviewed- last HgB done 09/12/18- 12.1.  Will forward to [name] for further review.

## 2018-09-16 NOTE — Telephone Encounter (Signed)
I attempted to call the patient. I left a message on his identified voice mail that per Dr. Tyrell Antonio most recent office note dated 06/30/18:   1.  Coronary artery disease involving native coronary arteries without angina: He is doing extremely well with no anginal symptoms. Continue dual antiplatelet therapy indefinitely if possible.

## 2018-09-16 NOTE — Telephone Encounter (Signed)
Pt c/o medication issue:  1. Name of Medication: clopidogrel (PLAVIX)  2. How are you currently taking this medication (dosage and times per day)? 75 MG - 1 tablet daily  3. Are you having a reaction (difficulty breathing--STAT)? no  4. What is your medication issue? Before ordering refills patient would like to speak with nurse about whether or not patient will be stopping Plavix medication soon or if [name] wants him to continue.  Please call to discuss.

## 2018-09-16 NOTE — Telephone Encounter (Signed)
Patient made aware to discontinue the Plavix but to stay on the Aspirin 81 mg daily. He has verbalized his understanding.

## 2018-10-03 ENCOUNTER — Other Ambulatory Visit: Payer: Self-pay | Admitting: Cardiovascular Disease

## 2018-10-26 ENCOUNTER — Telehealth: Payer: Self-pay | Admitting: Gastroenterology

## 2018-10-26 NOTE — Telephone Encounter (Signed)
Spoke with pt and notified him that he must call Endo unit day prior to procedure for arrival time and to please refer to procedure instruction sheet

## 2018-10-26 NOTE — Telephone Encounter (Signed)
Pt left vm he is scheduled for a follow up procedure next week and is calling to find out what time the procedure is so he can rearrange things

## 2018-11-03 ENCOUNTER — Ambulatory Visit: Payer: Medicare Other | Admitting: Certified Registered"

## 2018-11-03 ENCOUNTER — Encounter
Admission: RE | Disposition: A | Discharge status: Home / Self Care | Payer: Self-pay | Source: Home / Self Care | Attending: Gastroenterology

## 2018-11-03 ENCOUNTER — Ambulatory Visit
Admission: RE | Admit: 2018-11-03 | Discharge: 2018-11-03 | Disposition: A | Discharge status: Home / Self Care | Payer: Medicare Other | Attending: Gastroenterology | Admitting: Gastroenterology

## 2018-11-03 ENCOUNTER — Other Ambulatory Visit: Payer: Self-pay

## 2018-11-03 DIAGNOSIS — I252 Old myocardial infarction: Secondary | ICD-10-CM | POA: Diagnosis not present

## 2018-11-03 DIAGNOSIS — K279 Peptic ulcer, site unspecified, unspecified as acute or chronic, without hemorrhage or perforation: Secondary | ICD-10-CM

## 2018-11-03 DIAGNOSIS — I509 Heart failure, unspecified: Secondary | ICD-10-CM | POA: Insufficient documentation

## 2018-11-03 DIAGNOSIS — Z87891 Personal history of nicotine dependence: Secondary | ICD-10-CM | POA: Insufficient documentation

## 2018-11-03 DIAGNOSIS — K449 Diaphragmatic hernia without obstruction or gangrene: Secondary | ICD-10-CM | POA: Insufficient documentation

## 2018-11-03 DIAGNOSIS — Z7982 Long term (current) use of aspirin: Secondary | ICD-10-CM | POA: Diagnosis not present

## 2018-11-03 DIAGNOSIS — I110 Hypertensive heart disease with heart failure: Secondary | ICD-10-CM | POA: Insufficient documentation

## 2018-11-03 DIAGNOSIS — Z955 Presence of coronary angioplasty implant and graft: Secondary | ICD-10-CM | POA: Diagnosis not present

## 2018-11-03 DIAGNOSIS — Z79899 Other long term (current) drug therapy: Secondary | ICD-10-CM | POA: Diagnosis not present

## 2018-11-03 DIAGNOSIS — K250 Acute gastric ulcer with hemorrhage: Secondary | ICD-10-CM

## 2018-11-03 DIAGNOSIS — I2510 Atherosclerotic heart disease of native coronary artery without angina pectoris: Secondary | ICD-10-CM | POA: Diagnosis not present

## 2018-11-03 DIAGNOSIS — E785 Hyperlipidemia, unspecified: Secondary | ICD-10-CM | POA: Diagnosis not present

## 2018-11-03 DIAGNOSIS — Z8711 Personal history of peptic ulcer disease: Secondary | ICD-10-CM | POA: Diagnosis present

## 2018-11-03 DIAGNOSIS — K269 Duodenal ulcer, unspecified as acute or chronic, without hemorrhage or perforation: Secondary | ICD-10-CM | POA: Insufficient documentation

## 2018-11-03 DIAGNOSIS — J449 Chronic obstructive pulmonary disease, unspecified: Secondary | ICD-10-CM | POA: Diagnosis not present

## 2018-11-03 HISTORY — PX: ESOPHAGOGASTRODUODENOSCOPY (EGD) WITH PROPOFOL: SHX5813

## 2018-11-03 SURGERY — ESOPHAGOGASTRODUODENOSCOPY (EGD) WITH PROPOFOL
Anesthesia: General

## 2018-11-03 MED ORDER — PROPOFOL 10 MG/ML IV BOLUS
INTRAVENOUS | Status: DC | PRN
  Administered 2018-11-03 (×4): 50 mg via INTRAVENOUS

## 2018-11-03 MED ORDER — PROPOFOL 500 MG/50ML IV EMUL
INTRAVENOUS | Status: AC
  Filled 2018-11-03: qty 50

## 2018-11-03 MED ORDER — GLYCOPYRROLATE 0.2 MG/ML IJ SOLN
INTRAMUSCULAR | Status: DC | PRN
  Administered 2018-11-03: 0.2 mg via INTRAVENOUS

## 2018-11-03 MED ORDER — SODIUM CHLORIDE 0.9 % IV SOLN
INTRAVENOUS | Status: DC
  Administered 2018-11-03: 08:00:00 via INTRAVENOUS

## 2018-11-03 NOTE — Anesthesia Postprocedure Evaluation (Signed)
Anesthesia Post Note  Patient: Garrett Hunter  Procedure(s) Performed: ESOPHAGOGASTRODUODENOSCOPY (EGD) WITH PROPOFOL (N/A )  Patient location during evaluation: Endoscopy Anesthesia Type: General Level of consciousness: awake and alert Pain management: pain level controlled Vital Signs Assessment: post-procedure vital signs reviewed and stable Respiratory status: spontaneous breathing, nonlabored ventilation, respiratory function stable and patient connected to nasal cannula oxygen Cardiovascular status: blood pressure returned to baseline and stable Postop Assessment: no apparent nausea or vomiting Anesthetic complications: no     Last Vitals:  Vitals:   11/03/18 0853 11/03/18 0904  BP: 104/60 114/67  Pulse: 76 72  Resp: (!) 23   Temp:    SpO2: 94% 95%    Last Pain:  Vitals:   11/03/18 0904  TempSrc:   PainSc: 0-No pain                 Martha Clan

## 2018-11-03 NOTE — Anesthesia Post-op Follow-up Note (Signed)
Anesthesia QCDR form completed.        

## 2018-11-03 NOTE — Anesthesia Preprocedure Evaluation (Signed)
Anesthesia Evaluation  Patient identified by MRN, date of birth, ID band Patient awake    Reviewed: Allergy & Precautions, NPO status , Patient's Chart, lab work & pertinent test results, reviewed documented beta blocker date and time   History of Anesthesia Complications Negative for: history of anesthetic complications  Airway Mallampati: II  TM Distance: >3 FB     Dental  (+) Chipped   Pulmonary neg shortness of breath, neg sleep apnea, COPD, neg recent URI, former smoker,           Cardiovascular Exercise Tolerance: Good hypertension, Pt. on medications and Pt. on home beta blockers (-) angina+ CAD, + Past MI, + Cardiac Stents and +CHF  (-) CABG (-) dysrhythmias (-) Valvular Problems/Murmurs     Neuro/Psych negative neurological ROS  negative psych ROS   GI/Hepatic negative GI ROS, (+) Hepatitis - (as a child), A  Endo/Other  negative endocrine ROS  Renal/GU negative Renal ROS     Musculoskeletal   Abdominal   Peds  Hematology negative hematology ROS (+)   Anesthesia Other Findings Past Medical History: No date: AAA (abdominal aortic aneurysm) ([facility])     Comment:  Small noted during cardiac catheterization in 2011. No date: Benign neoplasm of colon No date: CHF (congestive heart failure) ([facility]) No date: Chronic airway obstruction, not elsewhere classified No date: Coronary artery disease     Comment:  Inferior MI in 1996 treated with TPA. Cardiac cath in               2011 at [facility] showed chronic occlusion of the proximal RCA              and proximal left circumflex without obstructive disease               in the LAD. Non-ST elevation myocardial infarction in               October 2013 while on vacation in [place]. Cardiac               catheterization showed plaque rupture in the proximal               ramus with thrombus. He had balloon angioplasty done.  No date: Coronary atherosclerosis of native  coronary artery No date: Hepatitis A     Comment:  teenager No date: Hyperlipidemia     Comment:  Intolerance to statins. No date: Hypertension No date: MI (myocardial infarction) ([place])     Comment:  x 3   Reproductive/Obstetrics                             Anesthesia Physical  Anesthesia Plan  ASA: III  Anesthesia Plan: General   Post-op Pain Management:    Induction: Intravenous  PONV Risk Score and Plan: 2 and Propofol infusion and TIVA  Airway Management Planned: Natural Airway and Nasal Cannula  Additional Equipment:   Intra-op Plan:   Post-operative Plan:   Informed Consent: I have reviewed the patients History and Physical, chart, labs and discussed the procedure including the risks, benefits and alternatives for the proposed anesthesia with the patient or authorized representative who has indicated his/her understanding and acceptance.       Plan Discussed with: CRNA  Anesthesia Plan Comments:         Anesthesia Quick Evaluation

## 2018-11-03 NOTE — Op Note (Signed)
[facility] Gastroenterology Patient Name: Garrett Hunter Procedure Date: 11/03/2018 7:28 AM MRN: [id number] Account #: [id number] Date of Birth: 19-Dec-1945 Admit Type: Outpatient Age: 73 Room: [facility] ENDO ROOM 2 Gender: Male Note Status: Finalized Procedure:            Upper GI endoscopy Indications:          Follow-up of acute gastric ulcer with hemorrhage Providers:            Lin Landsman MD, MD Referring MD:         Caprice Renshaw MD (Referring MD), Marga Hoots NP (Referring MD) Medicines:            Monitored Anesthesia Care Complications:        No immediate complications. Estimated blood loss: None. Procedure:            Pre-Anesthesia Assessment:                       - Prior to the procedure, a History and Physical was                        performed, and patient medications and allergies were                        reviewed. The patient is competent. The risks and                        benefits of the procedure and the sedation options and                        risks were discussed with the patient. All questions                        were answered and informed consent was obtained.                        Patient identification and proposed procedure were                        verified by the physician, the nurse, the                        anesthesiologist, the anesthetist and the technician in                        the pre-procedure area in the procedure room in the                        endoscopy suite. Mental Status Examination: alert and                        oriented. Airway Examination: normal oropharyngeal                        airway and neck mobility. Respiratory Examination:                        clear to auscultation. CV Examination: normal.  Prophylactic Antibiotics: The patient does not require                        prophylactic antibiotics. Prior Anticoagulants: The                         patient has taken aspirin, last dose was 1 day prior to                        procedure. ASA Grade Assessment: III - A patient with                        severe systemic disease. After reviewing the risks and                        benefits, the patient was deemed in satisfactory                        condition to undergo the procedure. The anesthesia plan                        was to use monitored anesthesia care (MAC). Immediately                        prior to administration of medications, the patient was                        re-assessed for adequacy to receive sedatives. The                        heart rate, respiratory rate, oxygen saturations, blood                        pressure, adequacy of pulmonary ventilation, and                        response to care were monitored throughout the                        procedure. The physical status of the patient was                        re-assessed after the procedure.                       After obtaining informed consent, the endoscope was                        passed under direct vision. Throughout the procedure,                        the patient's blood pressure, pulse, and oxygen                        saturations were monitored continuously. The Endoscope                        was introduced through the mouth, and advanced to the  second part of duodenum. The upper GI endoscopy was                        accomplished without difficulty. The patient tolerated                        the procedure well. Findings:      Localized mucosal changes characterized by prominent mucosal fold were       found in the area of the papilla. Biopsies were taken with a cold       forceps for histology.      The duodenal bulb was normal.      A healed ulcer was found on the lesser curvature of the gastric antrum.       The scar tissue was healthy in appearance.      A small hiatal hernia was  present.      The gastroesophageal junction and examined esophagus were normal. Impression:           - Mucosal changes in the duodenum. Biopsied.                       - Normal duodenal bulb.                       - Scar in the gastric antrum (lesser curvature).                       - Small hiatal hernia.                       - Normal gastroesophageal junction and esophagus. Recommendation:       - Await pathology results.                       - Discharge patient to home (with spouse).                       - Cardiac diet.                       - Continue present medications. Procedure Code(s):    --- Professional ---                       [phone], Esophagogastroduodenoscopy, flexible, transoral;                        with biopsy, single or multiple Diagnosis Code(s):    --- Professional ---                       K31.89, Other diseases of stomach and duodenum                       K44.9, Diaphragmatic hernia without obstruction or                        gangrene                       K25.0, Acute gastric ulcer with hemorrhage CPT copyright 2018 American Medical Association. All rights reserved. The codes documented in this report are preliminary and upon coder review may  be revised to meet current compliance  requirements. Dr. Ulyess Mort Lin Landsman MD, MD 11/03/2018 8:44:44 AM This report has been signed electronically. Number of Addenda: 0 Note Initiated On: 11/03/2018 7:28 AM      [facility]

## 2018-11-03 NOTE — Transfer of Care (Signed)
Immediate Anesthesia Transfer of Care Note  Patient: Garrett Hunter  Procedure(s) Performed: ESOPHAGOGASTRODUODENOSCOPY (EGD) WITH PROPOFOL (N/A )  Patient Location: PACU and Endoscopy Unit  Anesthesia Type:General  Level of Consciousness: awake, alert , oriented and patient cooperative  Airway & Oxygen Therapy: Patient Spontanous Breathing  Post-op Assessment: Report given to RN and Post -op Vital signs reviewed and stable  Post vital signs: Reviewed and stable  Last Vitals:  Vitals Value Taken Time  BP 106/53 11/03/2018  8:44 AM  Temp 36.1 C 11/03/2018  8:43 AM  Pulse 77 11/03/2018  8:45 AM  Resp 13 11/03/2018  8:45 AM  SpO2 99 % 11/03/2018  8:45 AM  Vitals shown include unvalidated device data.  Last Pain:  Vitals:   11/03/18 0748  TempSrc: Tympanic  PainSc: 0-No pain         Complications: No apparent anesthesia complications

## 2018-11-03 NOTE — H&P (Signed)
[name], MD [address]  [place], [place] [address]  Main: [phone]  Fax: [phone] Pager: [phone]  Primary Care Physician:  Derinda Late, MD Primary Gastroenterologist:  Dr. [name]  Pre-Procedure History & Physical: HPI:  Garrett Hunter is a 73 y.o. male is here for an endoscopy.   Past Medical History:  Diagnosis Date  . AAA (abdominal aortic aneurysm) ([place])    Small noted during cardiac catheterization in 2011.  [name] Benign neoplasm of colon   . CHF (congestive heart failure) ([place])   . Chronic airway obstruction, not elsewhere classified   . Coronary artery disease    Inferior MI in 1996 treated with TPA. Cardiac cath in 2011 at [facility] showed chronic occlusion of the proximal RCA and proximal left circumflex without obstructive disease in the LAD. Non-ST elevation myocardial infarction in October 2013 while on vacation in [place]. Cardiac catheterization showed plaque rupture in the proximal ramus with thrombus. He had balloon angioplasty done.   . Coronary atherosclerosis of native coronary artery   . Hepatitis A    teenager  . Hyperlipidemia    Intolerance to statins.  . Hypertension   . MI (myocardial infarction) ([place])    x 3    Past Surgical History:  Procedure Laterality Date  . ABDOMINAL AORTIC ANEURYSM REPAIR     11/16/2016  . ANGIOPLASTY    . CARDIAC CATHETERIZATION  2011  . CARDIAC CATHETERIZATION  2/14   [facility]; 95% lesion in the ramus intermedius.   . CORONARY ANGIOPLASTY  1996   [facility] x1  . CORONARY ANGIOPLASTY  2/14   Severe restenosis in the ostial ramus. Status post angioplasty and drug-eluting stent placement with a 2.5 x 18 mm Xience drug-eluting stent  . ESOPHAGOGASTRODUODENOSCOPY N/A 08/03/2018   Procedure: ESOPHAGOGASTRODUODENOSCOPY (EGD);  Surgeon: Lin Landsman, MD;  Location: [facility] ENDOSCOPY;  Service: Gastroenterology;  Laterality: N/A;  . TONSILLECTOMY AND ADENOIDECTOMY      Prior to Admission  medications   Medication Sig Start Date End Date Taking? Authorizing Provider  acetaminophen (TYLENOL) 325 MG tablet Take 650 mg by mouth as needed for mild pain or headache.  11/18/16  Yes [provider]  aspirin EC 81 MG tablet Take 81 mg by mouth daily.   Yes [provider]  carvedilol (COREG) 12.5 MG tablet Take 1 tablet (12.5 mg total) by mouth 2 (two) times daily. 09/02/17  Yes Wellington Hampshire, MD  Coenzyme Q10 (COQ-10) 100 MG CAPS Take 100 mg by mouth daily.    Yes [provider]  folic acid (FOLVITE) 277 MCG tablet Take 800 mcg by mouth daily.   Yes [provider]  lisinopril-hydrochlorothiazide (PRINZIDE,ZESTORETIC) 20-12.5 MG tablet Take 2 tablets by mouth daily.  05/16/18  Yes [provider]  nitroGLYCERIN (NITROSTAT) 0.4 MG SL tablet Place 0.4 mg under the tongue every 5 (five) minutes as needed for chest pain.   Yes [provider]  rosuvastatin (CRESTOR) 5 MG tablet TAKE 1 TABLET DAILY Patient taking differently: Take 5 mg by mouth daily.  10/11/17  Yes Wellington Hampshire, MD  vitamin B-12 (CYANOCOBALAMIN) 1000 MCG tablet Take 1,000 mcg by mouth daily.   Yes [provider]  Javier Docker Oil 1000 MG CAPS Take 1,000 mg by mouth daily.     [provider]  pantoprazole (PROTONIX) 40 MG tablet Take 1 tablet (40 mg total) by mouth 2 (two) times daily. 08/03/18 08/03/19  Loletha Grayer, MD  Allergies as of 09/12/2018 - Review Complete 09/12/2018  Allergen Reaction Noted  . Crestor [rosuvastatin]  07/14/2012  . Livalo [pitavastatin]  07/14/2012  . Pravachol [pravastatin sodium]  07/14/2012  . Zocor [simvastatin]  07/14/2012    Family History  Problem Relation Age of Onset  . Cancer Mother 110       stomach  . Heart attack Father   . Hypertension Sister   . Hypertension Brother     Social History   Socioeconomic History  . Marital status: Married    Spouse name: Not on file  . Number of children: 4  .  Years of education: Not on file  . Highest education level: Not on file  Occupational History  . Not on file  Social Needs  . Financial resource strain: Not hard at all  . Food insecurity:    Worry: Never true    Inability: Never true  . Transportation needs:    Medical: Not on file    Non-medical: No  Tobacco Use  . Smoking status: Former Smoker    Packs/day: 2.00    Years: 40.00    Pack years: 80.00    Types: Cigarettes  . Smokeless tobacco: Former Systems developer    Types: Chew  Substance and Sexual Activity  . Alcohol use: Yes    Alcohol/week: 3.0 standard drinks    Types: 3 Cans of beer per week    Comment: occassionally  . Drug use: No  . Sexual activity: Yes  Lifestyle  . Physical activity:    Days per week: 0 days    Minutes per session: 0 min  . Stress: Not at all  Relationships  . Social connections:    Talks on phone: Twice a week    Gets together: Twice a week    Attends religious service: 1 to 4 times per year    Active member of club or organization: No    Attends meetings of clubs or organizations: Never    Relationship status: Married  . Intimate partner violence:    Fear of current or ex partner: No    Emotionally abused: No    Physically abused: No    Forced sexual activity: No  Other Topics Concern  . Not on file  Social History Narrative   Lives at home with wife, works    Review of Systems: See HPI, otherwise negative ROS  Physical Exam: BP (!) 149/77   Pulse 68   Temp (!) 96.8 F (36 C) (Tympanic)   Resp 18   Ht 5\' 11"  (1.803 m)   Wt 106.6 kg   SpO2 98%   BMI 32.78 kg/m  General:   Alert,  pleasant and cooperative in NAD Head:  Normocephalic and atraumatic. Neck:  Supple; no masses or thyromegaly. Lungs:  Clear throughout to auscultation.    Heart:  Regular rate and rhythm. Abdomen:  Soft, nontender and nondistended. Normal bowel sounds, without guarding, and without rebound.   Neurologic:  Alert and  oriented x4;  grossly normal  neurologically.  Impression/Plan: Garrett Hunter is here for an endoscopy to be performed for f/u of gastric ulcer  Risks, benefits, limitations, and alternatives regarding  endoscopy have been reviewed with the patient.  Questions have been answered.  All parties agreeable.   Sherri Sear, MD  11/03/2018, 8:28 AM

## 2018-11-04 LAB — SURGICAL PATHOLOGY

## 2018-11-07 ENCOUNTER — Encounter: Payer: Self-pay | Admitting: Gastroenterology

## 2018-11-15 ENCOUNTER — Other Ambulatory Visit: Payer: Self-pay

## 2018-11-15 DIAGNOSIS — K279 Peptic ulcer, site unspecified, unspecified as acute or chronic, without hemorrhage or perforation: Secondary | ICD-10-CM

## 2018-11-18 ENCOUNTER — Telehealth: Payer: Self-pay | Admitting: Gastroenterology

## 2018-11-18 NOTE — Telephone Encounter (Signed)
Patient called again and was anxious stated her knew we got busy on Friday afternoon's.

## 2018-11-18 NOTE — Telephone Encounter (Signed)
Pt left vm he states he just saw on his MyChart that he is scheduled for a procedure for 12/06/18 and he has no idea what that is about he states he just had a procedure with Dr. Marius Ditch recently and would like a call explaining this

## 2018-11-19 ENCOUNTER — Other Ambulatory Visit: Payer: Self-pay | Admitting: Cardiovascular Disease

## 2018-11-21 NOTE — Telephone Encounter (Signed)
Spoke with this pt on Friday to explain to him why his was having another procedure done with Dr. [place] Norris and not Dr. Marius Ditch. Pt was a bit upset that he had not heard about the results of the previous EGD before scheduling another. I did advise pt a voice mail was left for him to return Temeka's call to schedule. Pt would like a call to explain again why you are needing this procedure done. I tried to explain to him but he prefers to hear from you.

## 2018-11-22 ENCOUNTER — Telehealth: Payer: Self-pay | Admitting: Gastroenterology

## 2018-11-22 NOTE — Telephone Encounter (Signed)
Pt is returning a call to DR. Vanga

## 2018-11-23 NOTE — Telephone Encounter (Signed)
RE: Review  Received: 2 weeks ago  Message Contents  Lucilla Lame, MD  Lin Landsman, MD        Hi,   Either that or repeat EGD with more biopsies, EGD with side-viewing scope which I will be happy to do versus EUS. I don't think any of these recommendations are better than any other. He did have some sign of mucosal breakdown the stomach also that would lead me to think that this may be a benign lesion associated with peptic ulcer disease.  Darren   Previous Messages    ----- Message -----  From: Lin Landsman, MD  Sent: 11/04/2018  3:07 PM EST  To: Lucilla Lame, MD  Subject: Review                      Darren  Can you review his pathology and correlate with the upper endoscopy pictures. This is near the ampulla. I was wondering if I need to set him up for EUS   Thanks   Rohini  ----- Message -----  From: Interface, [facility]  Sent: 11/04/2018  9:30 AM EST  To: Lin Landsman, MD     Returned patient call and explained to him about the indication for EGD with side-viewing scope.  Patient agreed to undergo repeat EGD with Dr. Dionisio David

## 2018-11-23 NOTE — Telephone Encounter (Signed)
Patient is returning Dr Verlin Grills call. Please call cell #.

## 2018-12-05 ENCOUNTER — Encounter: Payer: Self-pay | Admitting: *Deleted

## 2018-12-06 ENCOUNTER — Ambulatory Visit: Payer: Medicare Other | Admitting: Certified Registered Nurse Anesthetist

## 2018-12-06 ENCOUNTER — Other Ambulatory Visit: Payer: Self-pay

## 2018-12-06 ENCOUNTER — Encounter
Admission: RE | Disposition: A | Discharge status: Home / Self Care | Payer: Self-pay | Source: Home / Self Care | Attending: Gastroenterology

## 2018-12-06 ENCOUNTER — Ambulatory Visit
Admission: RE | Admit: 2018-12-06 | Discharge: 2018-12-06 | Disposition: A | Discharge status: Home / Self Care | Payer: Medicare Other | Attending: Gastroenterology | Admitting: Gastroenterology

## 2018-12-06 DIAGNOSIS — K3189 Other diseases of stomach and duodenum: Secondary | ICD-10-CM | POA: Insufficient documentation

## 2018-12-06 DIAGNOSIS — Z7982 Long term (current) use of aspirin: Secondary | ICD-10-CM | POA: Insufficient documentation

## 2018-12-06 DIAGNOSIS — Z87891 Personal history of nicotine dependence: Secondary | ICD-10-CM | POA: Diagnosis not present

## 2018-12-06 DIAGNOSIS — E1151 Type 2 diabetes mellitus with diabetic peripheral angiopathy without gangrene: Secondary | ICD-10-CM | POA: Diagnosis not present

## 2018-12-06 DIAGNOSIS — Z8711 Personal history of peptic ulcer disease: Secondary | ICD-10-CM | POA: Diagnosis not present

## 2018-12-06 DIAGNOSIS — I509 Heart failure, unspecified: Secondary | ICD-10-CM | POA: Insufficient documentation

## 2018-12-06 DIAGNOSIS — R933 Abnormal findings on diagnostic imaging of other parts of digestive tract: Secondary | ICD-10-CM | POA: Diagnosis present

## 2018-12-06 DIAGNOSIS — J449 Chronic obstructive pulmonary disease, unspecified: Secondary | ICD-10-CM | POA: Insufficient documentation

## 2018-12-06 DIAGNOSIS — I2510 Atherosclerotic heart disease of native coronary artery without angina pectoris: Secondary | ICD-10-CM | POA: Insufficient documentation

## 2018-12-06 DIAGNOSIS — I252 Old myocardial infarction: Secondary | ICD-10-CM | POA: Diagnosis not present

## 2018-12-06 DIAGNOSIS — Z79899 Other long term (current) drug therapy: Secondary | ICD-10-CM | POA: Insufficient documentation

## 2018-12-06 DIAGNOSIS — K279 Peptic ulcer, site unspecified, unspecified as acute or chronic, without hemorrhage or perforation: Secondary | ICD-10-CM | POA: Diagnosis not present

## 2018-12-06 DIAGNOSIS — I110 Hypertensive heart disease with heart failure: Secondary | ICD-10-CM | POA: Diagnosis not present

## 2018-12-06 DIAGNOSIS — Z955 Presence of coronary angioplasty implant and graft: Secondary | ICD-10-CM | POA: Insufficient documentation

## 2018-12-06 DIAGNOSIS — I714 Abdominal aortic aneurysm, without rupture: Secondary | ICD-10-CM | POA: Diagnosis not present

## 2018-12-06 DIAGNOSIS — K2970 Gastritis, unspecified, without bleeding: Secondary | ICD-10-CM | POA: Insufficient documentation

## 2018-12-06 DIAGNOSIS — E785 Hyperlipidemia, unspecified: Secondary | ICD-10-CM | POA: Insufficient documentation

## 2018-12-06 DIAGNOSIS — K269 Duodenal ulcer, unspecified as acute or chronic, without hemorrhage or perforation: Secondary | ICD-10-CM | POA: Diagnosis not present

## 2018-12-06 HISTORY — PX: ESOPHAGOGASTRODUODENOSCOPY (EGD) WITH PROPOFOL: SHX5813

## 2018-12-06 SURGERY — ESOPHAGOGASTRODUODENOSCOPY (EGD) WITH PROPOFOL
Anesthesia: General

## 2018-12-06 MED ORDER — LIDOCAINE HCL (CARDIAC) PF 100 MG/5ML IV SOSY
PREFILLED_SYRINGE | INTRAVENOUS | Status: DC | PRN
  Administered 2018-12-06: 100 mg via INTRAVENOUS

## 2018-12-06 MED ORDER — PANTOPRAZOLE SODIUM 40 MG PO TBEC: 40.0000 mg | DELAYED_RELEASE_TABLET | Freq: Every day | ORAL | 11 refills | Status: DC

## 2018-12-06 MED ORDER — PROPOFOL 10 MG/ML IV BOLUS
INTRAVENOUS | Status: AC
  Filled 2018-12-06: qty 80

## 2018-12-06 MED ORDER — SODIUM CHLORIDE 0.9 % IV SOLN
INTRAVENOUS | Status: DC
  Administered 2018-12-06: 09:00:00 via INTRAVENOUS
  Administered 2018-12-06: 1000 mL via INTRAVENOUS

## 2018-12-06 MED ORDER — PROPOFOL 10 MG/ML IV BOLUS
INTRAVENOUS | Status: DC | PRN
  Administered 2018-12-06: 50 mg via INTRAVENOUS
  Administered 2018-12-06: 20 mg via INTRAVENOUS
  Administered 2018-12-06: 50 mg via INTRAVENOUS

## 2018-12-06 NOTE — Anesthesia Preprocedure Evaluation (Signed)
Anesthesia Evaluation  Patient identified by MRN, date of birth, ID band Patient awake    Reviewed: Allergy & Precautions, NPO status , Patient's Chart, lab work & pertinent test results  History of Anesthesia Complications Negative for: history of anesthetic complications  Airway Mallampati: III  TM Distance: >3 FB Neck ROM: Full    Dental  (+) Partial Lower, Partial Upper   Pulmonary neg sleep apnea, COPD, former smoker,    breath sounds clear to auscultation- rhonchi (-) wheezing      Cardiovascular hypertension, Pt. on medications + CAD, + Past MI, + Cardiac Stents, + Peripheral Vascular Disease and +CHF   Rhythm:Regular Rate:Normal - Systolic murmurs and - Diastolic murmurs    Neuro/Psych neg Seizures negative neurological ROS  negative psych ROS   GI/Hepatic negative GI ROS, Neg liver ROS,   Endo/Other  diabetes  Renal/GU negative Renal ROS     Musculoskeletal negative musculoskeletal ROS (+)   Abdominal (+) + obese,   Peds  Hematology negative hematology ROS (+)   Anesthesia Other Findings Past Medical History: No date: AAA (abdominal aortic aneurysm) ([facility])     Comment:  Small noted during cardiac catheterization in 2011. No date: Benign neoplasm of colon No date: CHF (congestive heart failure) ([facility]) No date: Chronic airway obstruction, not elsewhere classified No date: Coronary artery disease     Comment:  Inferior MI in 1996 treated with TPA. Cardiac cath in               2011 at [facility] showed chronic occlusion of the proximal RCA              and proximal left circumflex without obstructive disease               in the LAD. Non-ST elevation myocardial infarction in               October 2013 while on vacation in [place]. Cardiac               catheterization showed plaque rupture in the proximal               ramus with thrombus. He had balloon angioplasty done.  No date: Coronary atherosclerosis of  native coronary artery No date: Hepatitis A     Comment:  teenager No date: Hyperlipidemia     Comment:  Intolerance to statins. No date: Hypertension No date: MI (myocardial infarction) ([place])     Comment:  x 3   Reproductive/Obstetrics                             Anesthesia Physical Anesthesia Plan  ASA: III  Anesthesia Plan: General   Post-op Pain Management:    Induction: Intravenous  PONV Risk Score and Plan: 1 and Propofol infusion  Airway Management Planned: Natural Airway  Additional Equipment:   Intra-op Plan:   Post-operative Plan:   Informed Consent: I have reviewed the patients History and Physical, chart, labs and discussed the procedure including the risks, benefits and alternatives for the proposed anesthesia with the patient or authorized representative who has indicated his/her understanding and acceptance.     Dental advisory given  Plan Discussed with: CRNA and Anesthesiologist  Anesthesia Plan Comments:         Anesthesia Quick Evaluation

## 2018-12-06 NOTE — Transfer of Care (Signed)
Immediate Anesthesia Transfer of Care Note  Patient: Garrett Hunter  Procedure(s) Performed: ESOPHAGOGASTRODUODENOSCOPY (EGD) WITH PROPOFOL (N/A )  Patient Location: PACU and Endoscopy Unit  Anesthesia Type:General  Level of Consciousness: awake, oriented, drowsy and patient cooperative  Airway & Oxygen Therapy: Patient Spontanous Breathing and Patient connected to nasal cannula oxygen  Post-op Assessment: Report given to RN, Post -op Vital signs reviewed and stable and Patient moving all extremities  Post vital signs: Reviewed and stable  Last Vitals:  Vitals Value Taken Time  BP 119/71 12/06/2018  9:27 AM  Temp 36.1 C 12/06/2018  9:27 AM  Pulse 74 12/06/2018  9:27 AM  Resp 17 12/06/2018  9:27 AM  SpO2 96 % 12/06/2018  9:27 AM  Vitals shown include unvalidated device data.  Last Pain:  Vitals:   12/06/18 0927  TempSrc: Tympanic  PainSc: 0-No pain         Complications: No apparent anesthesia complications

## 2018-12-06 NOTE — Anesthesia Postprocedure Evaluation (Signed)
Anesthesia Post Note  Patient: Garrett Hunter  Procedure(s) Performed: ESOPHAGOGASTRODUODENOSCOPY (EGD) WITH PROPOFOL (N/A )  Patient location during evaluation: Endoscopy Anesthesia Type: General Level of consciousness: awake and alert and oriented Pain management: pain level controlled Vital Signs Assessment: post-procedure vital signs reviewed and stable Respiratory status: spontaneous breathing, nonlabored ventilation and respiratory function stable Cardiovascular status: blood pressure returned to baseline and stable Postop Assessment: no signs of nausea or vomiting Anesthetic complications: no     Last Vitals:  Vitals:   12/06/18 0841 12/06/18 0927  BP: (!) 151/101 119/71  Pulse: 71   Resp: 20   Temp: (!) 36.2 C (!) 36.1 C  SpO2: 95%     Last Pain:  Vitals:   12/06/18 0948  TempSrc:   PainSc: 0-No pain                 Mystique Bjelland

## 2018-12-06 NOTE — Anesthesia Post-op Follow-up Note (Signed)
Anesthesia QCDR form completed.        

## 2018-12-06 NOTE — H&P (Signed)
Garrett Lame, MD [facility] [address] [place], [place] [address] Phone:[phone] Fax : [phone]  Primary Care Physician:  Derinda Late, MD Primary Gastroenterologist:  Dr. [place] Norris  Pre-Procedure History & Physical: HPI:  Garrett Hunter is a 73 y.o. male is here for an endoscopy.   Past Medical History:  Diagnosis Date  . AAA (abdominal aortic aneurysm) ([place])    Small noted during cardiac catheterization in 2011.  [name] Benign neoplasm of colon   . CHF (congestive heart failure) ([place])   . Chronic airway obstruction, not elsewhere classified   . Coronary artery disease    Inferior MI in 1996 treated with TPA. Cardiac cath in 2011 at [facility] showed chronic occlusion of the proximal RCA and proximal left circumflex without obstructive disease in the LAD. Non-ST elevation myocardial infarction in October 2013 while on vacation in [place]. Cardiac catheterization showed plaque rupture in the proximal ramus with thrombus. He had balloon angioplasty done.   . Coronary atherosclerosis of native coronary artery   . Hepatitis A    teenager  . Hyperlipidemia    Intolerance to statins.  . Hypertension   . MI (myocardial infarction) ([place])    x 3    Past Surgical History:  Procedure Laterality Date  . ABDOMINAL AORTIC ANEURYSM REPAIR     11/16/2016  . ANGIOPLASTY    . CARDIAC CATHETERIZATION  2011  . CARDIAC CATHETERIZATION  2/14   [facility]; 95% lesion in the ramus intermedius.   . CORONARY ANGIOPLASTY  1996   [facility] x1  . CORONARY ANGIOPLASTY  2/14   Severe restenosis in the ostial ramus. Status post angioplasty and drug-eluting stent placement with a 2.5 x 18 mm Xience drug-eluting stent  . ESOPHAGOGASTRODUODENOSCOPY N/A 08/03/2018   Procedure: ESOPHAGOGASTRODUODENOSCOPY (EGD);  Surgeon: Lin Landsman, MD;  Location: [facility] ENDOSCOPY;  Service: Gastroenterology;  Laterality: N/A;  . ESOPHAGOGASTRODUODENOSCOPY (EGD) WITH PROPOFOL N/A 11/03/2018   Procedure:  ESOPHAGOGASTRODUODENOSCOPY (EGD) WITH PROPOFOL;  Surgeon: Lin Landsman, MD;  Location: [facility] ENDOSCOPY;  Service: Gastroenterology;  Laterality: N/A;  . TONSILLECTOMY AND ADENOIDECTOMY      Prior to Admission medications   Medication Sig Start Date End Date Taking? Authorizing Provider  acetaminophen (TYLENOL) 325 MG tablet Take 650 mg by mouth as needed for mild pain or headache.  11/18/16  Yes [provider]  aspirin EC 81 MG tablet Take 81 mg by mouth daily.   Yes [provider]  carvedilol (COREG) 12.5 MG tablet Take 1 tablet (12.5 mg total) by mouth 2 (two) times daily. 09/02/17  Yes Wellington Hampshire, MD  Coenzyme Q10 (COQ-10) 100 MG CAPS Take 100 mg by mouth daily.    Yes [provider]  folic acid (FOLVITE) 801 MCG tablet Take 800 mcg by mouth daily.   Yes [provider]  Javier Docker Oil 1000 MG CAPS Take 1,000 mg by mouth daily.    Yes [provider]  lisinopril-hydrochlorothiazide (PRINZIDE,ZESTORETIC) 20-12.5 MG tablet Take 2 tablets by mouth daily.  05/16/18  Yes [provider]  rosuvastatin (CRESTOR) 5 MG tablet Take 1 tablet (5 mg total) by mouth daily. 11/21/18  Yes Wellington Hampshire, MD  vitamin B-12 (CYANOCOBALAMIN) 1000 MCG tablet Take 1,000 mcg by mouth daily.   Yes [provider]  nitroGLYCERIN (NITROSTAT) 0.4 MG SL tablet Place 0.4 mg under the tongue every 5 (five) minutes as needed for chest pain.    [provider]    Allergies as of 11/16/2018 -  Review Complete 11/03/2018  Allergen Reaction Noted  . Crestor [rosuvastatin]  07/14/2012  . Livalo [pitavastatin]  07/14/2012  . Pravachol [pravastatin sodium]  07/14/2012  . Zocor [simvastatin]  07/14/2012    Family History  Problem Relation Age of Onset  . Cancer Mother 66       stomach  . Heart attack Father   . Hypertension Sister   . Hypertension Brother     Social History   Socioeconomic History  . Marital status: Married     Spouse name: Not on file  . Number of children: 4  . Years of education: Not on file  . Highest education level: Not on file  Occupational History  . Not on file  Social Needs  . Financial resource strain: Not hard at all  . Food insecurity:    Worry: Never true    Inability: Never true  . Transportation needs:    Medical: Not on file    Non-medical: No  Tobacco Use  . Smoking status: Former Smoker    Packs/day: 2.00    Years: 40.00    Pack years: 80.00    Types: Cigarettes  . Smokeless tobacco: Former Systems developer    Types: Chew  Substance and Sexual Activity  . Alcohol use: Yes    Alcohol/week: 3.0 standard drinks    Types: 3 Cans of beer per week    Comment: occassionally  . Drug use: No  . Sexual activity: Yes  Lifestyle  . Physical activity:    Days per week: 0 days    Minutes per session: 0 min  . Stress: Not at all  Relationships  . Social connections:    Talks on phone: Twice a week    Gets together: Twice a week    Attends religious service: 1 to 4 times per year    Active member of club or organization: No    Attends meetings of clubs or organizations: Never    Relationship status: Married  . Intimate partner violence:    Fear of current or ex partner: No    Emotionally abused: No    Physically abused: No    Forced sexual activity: No  Other Topics Concern  . Not on file  Social History Narrative   Lives at home with wife, works    Review of Systems: See HPI, otherwise negative ROS  Physical Exam: BP (!) 151/101   Pulse 71   Temp (!) 97.1 F (36.2 C) (Tympanic)   Resp 20   Ht 5' 10.5" (1.791 m)   Wt 106.6 kg   SpO2 95%   BMI 33.24 kg/m  General:   Alert,  pleasant and cooperative in NAD Head:  Normocephalic and atraumatic. Neck:  Supple; no masses or thyromegaly. Lungs:  Clear throughout to auscultation.    Heart:  Regular rate and rhythm. Abdomen:  Soft, nontender and nondistended. Normal bowel sounds, without guarding, and without rebound.     Neurologic:  Alert and  oriented x4;  grossly normal neurologically.  Impression/Plan: Garrett Hunter is here for an endoscopy to be performed for abnormal appula on EGD  Risks, benefits, limitations, and alternatives regarding  endoscopy have been reviewed with the patient.  Questions have been answered.  All parties agreeable.   Garrett Lame, MD  12/06/2018, 9:12 AM

## 2018-12-07 ENCOUNTER — Encounter: Payer: Self-pay | Admitting: Gastroenterology

## 2018-12-07 ENCOUNTER — Telehealth: Payer: Self-pay

## 2018-12-07 LAB — SURGICAL PATHOLOGY

## 2018-12-07 NOTE — Telephone Encounter (Signed)
-----   Message from Lucilla Lame, MD sent at 12/07/2018  3:30 PM EST ----- Let the patient know that the biopsy showed inflammation but no cancer or precancerous tissue. The Visualization of the ampulla with the side-viewing scope did not look worrisome and no further biopsies are needed.  He should continue the acid suppression to heal his gastritis.

## 2018-12-07 NOTE — Op Note (Signed)
[facility]astroenter[facility] Patient Name: Garrett Hunter Procedure Date: 12/06/2018 9:07 AM MRN: [id number] Account #: [id number] Date of Birth: 1946/07/24 Admit Type: Outpatient Age: 73 Room: [facility] ENDO ROOM 4 Gender: Male Note Status: Finalized Procedure:            Upper GI endoscopy Indications:          Follow-up of peptic ulcer Providers:            Lucilla Lame MD, MD Medicines:            Propofol per Anesthesia Complications:        No immediate complications. Procedure:            Pre-Anesthesia Assessment:                       - Prior to the procedure, a History and Physical was                        performed, and patient medications and allergies were                        reviewed. The patient's tolerance of previous                        anesthesia was also reviewed. The risks and benefits of                        the procedure and the sedation options and risks were                        discussed with the patient. All questions were                        answered, and informed consent was obtained. Prior                        Anticoagulants: The patient has taken no previous                        anticoagulant or antiplatelet agents. ASA Grade                        Assessment: II - A patient with mild systemic disease.                        After reviewing the risks and benefits, the patient was                        deemed in satisfactory condition to undergo the                        procedure.                       After obtaining informed consent, the endoscope was                        passed under direct vision. Throughout the procedure,                        the patient's blood  pressure, pulse, and oxygen                        saturations were monitored continuously. The                        Duodenoscope was introduced through the mouth, and                        advanced to the second part of duodenum. The upper GI                     endoscopy was accomplished without difficulty. The                        patient tolerated the procedure well. The upper GI                        endoscopy was accomplished without difficulty. The                        patient tolerated the procedure well. Findings:      The examined esophagus was normal.      Localized moderate inflammation characterized by adherent blood was       found in the gastric antrum.      One non-bleeding linear duodenal ulcer with no stigmata of bleeding was       found in the ampulla. This was biopsied with a cold forceps for       histology. Impression:           - Normal esophagus.                       - Gastritis.                       - One non-bleeding duodenal ulcer with no stigmata of                        bleeding. Biopsied. Recommendation:       - Discharge patient to home.                       - Resume previous diet.                       - Continue present medications.                       - Await pathology results. Procedure Code(s):    --- Professional ---                       [id number], Esophagogastroduodenoscopy, flexible, transoral;                        with biopsy, single or multiple Diagnosis Code(s):    --- Professional ---                       K27.9, Peptic ulcer, site unspecified, unspecified as                        acute or chronic, without hemorrhage or perforation  K26.9, Duodenal ulcer, unspecified as acute or chronic,                        without hemorrhage or perforation                       K29.70, Gastritis, unspecified, without bleeding CPT copyright 2018 American Medical Association. All rights reserved. The codes documented in this report are preliminary and upon coder review may  be revised to meet current compliance requirements. Lucilla Lame MD, MD 12/06/2018 9:26:13 AM This report has been signed electronically. Number of Addenda: 0 Note Initiated On: 12/06/2018 9:07 AM       [facility]

## 2018-12-07 NOTE — Telephone Encounter (Signed)
Pt notified of EGD result.

## 2018-12-12 ENCOUNTER — Other Ambulatory Visit: Payer: Self-pay

## 2018-12-12 ENCOUNTER — Encounter: Payer: Self-pay | Admitting: *Deleted

## 2018-12-15 NOTE — Discharge Instructions (Signed)

## 2018-12-19 ENCOUNTER — Ambulatory Visit: Payer: Medicare Other | Admitting: Anesthesiology

## 2018-12-19 ENCOUNTER — Ambulatory Visit
Admission: RE | Admit: 2018-12-19 | Discharge: 2018-12-19 | Disposition: A | Discharge status: Home / Self Care | Payer: Medicare Other | Attending: Ophthalmology | Admitting: Ophthalmology

## 2018-12-19 ENCOUNTER — Encounter
Admission: RE | Disposition: A | Discharge status: Home / Self Care | Payer: Self-pay | Source: Home / Self Care | Attending: Ophthalmology

## 2018-12-19 DIAGNOSIS — J449 Chronic obstructive pulmonary disease, unspecified: Secondary | ICD-10-CM | POA: Insufficient documentation

## 2018-12-19 DIAGNOSIS — I2510 Atherosclerotic heart disease of native coronary artery without angina pectoris: Secondary | ICD-10-CM | POA: Diagnosis not present

## 2018-12-19 DIAGNOSIS — I10 Essential (primary) hypertension: Secondary | ICD-10-CM | POA: Diagnosis not present

## 2018-12-19 DIAGNOSIS — E119 Type 2 diabetes mellitus without complications: Secondary | ICD-10-CM | POA: Insufficient documentation

## 2018-12-19 DIAGNOSIS — Z7982 Long term (current) use of aspirin: Secondary | ICD-10-CM | POA: Insufficient documentation

## 2018-12-19 DIAGNOSIS — Z79899 Other long term (current) drug therapy: Secondary | ICD-10-CM | POA: Insufficient documentation

## 2018-12-19 DIAGNOSIS — Z955 Presence of coronary angioplasty implant and graft: Secondary | ICD-10-CM | POA: Insufficient documentation

## 2018-12-19 DIAGNOSIS — I252 Old myocardial infarction: Secondary | ICD-10-CM | POA: Diagnosis not present

## 2018-12-19 DIAGNOSIS — H2512 Age-related nuclear cataract, left eye: Secondary | ICD-10-CM | POA: Insufficient documentation

## 2018-12-19 DIAGNOSIS — Z87891 Personal history of nicotine dependence: Secondary | ICD-10-CM | POA: Insufficient documentation

## 2018-12-19 HISTORY — PX: CATARACT EXTRACTION W/PHACO: SHX586

## 2018-12-19 SURGERY — PHACOEMULSIFICATION, CATARACT, WITH IOL INSERTION
Anesthesia: Monitor Anesthesia Care | Site: Eye | Laterality: Left

## 2018-12-19 MED ORDER — OXYCODONE HCL 5 MG PO TABS: 5.0000 mg | ORAL_TABLET | Freq: Once | ORAL | Status: DC | PRN

## 2018-12-19 MED ORDER — LIDOCAINE HCL (PF) 2 % IJ SOLN
INTRAOCULAR | Status: DC | PRN
  Administered 2018-12-19: 1 mL via INTRAOCULAR

## 2018-12-19 MED ORDER — OXYCODONE HCL 5 MG/5ML PO SOLN: 5.0000 mg | Freq: Once | ORAL | Status: DC | PRN

## 2018-12-19 MED ORDER — FENTANYL CITRATE (PF) 100 MCG/2ML IJ SOLN
INTRAMUSCULAR | Status: DC | PRN
  Administered 2018-12-19: 50 ug via INTRAVENOUS

## 2018-12-19 MED ORDER — SODIUM HYALURONATE 23 MG/ML IO SOLN
INTRAOCULAR | Status: DC | PRN
  Administered 2018-12-19: 0.6 mL via INTRAOCULAR

## 2018-12-19 MED ORDER — MOXIFLOXACIN HCL 0.5 % OP SOLN
OPHTHALMIC | Status: DC | PRN
  Administered 2018-12-19: 0.2 mL via OPHTHALMIC

## 2018-12-19 MED ORDER — SODIUM HYALURONATE 10 MG/ML IO SOLN
INTRAOCULAR | Status: DC | PRN
  Administered 2018-12-19: 0.55 mL via INTRAOCULAR

## 2018-12-19 MED ORDER — EPINEPHRINE PF 1 MG/ML IJ SOLN
INTRAOCULAR | Status: DC | PRN
  Administered 2018-12-19: 82 mL via OPHTHALMIC

## 2018-12-19 MED ORDER — ARMC OPHTHALMIC DILATING DROPS
1.0000 "application " | OPHTHALMIC | Status: DC | PRN
  Administered 2018-12-19 (×3): 1 via OPHTHALMIC

## 2018-12-19 MED ORDER — TETRACAINE HCL 0.5 % OP SOLN
1.0000 [drp] | OPHTHALMIC | Status: DC | PRN
  Administered 2018-12-19 (×3): 1 [drp] via OPHTHALMIC

## 2018-12-19 SURGICAL SUPPLY — 17 items
CANNULA ANT/CHMB 27GA (MISCELLANEOUS) ×4 IMPLANT
DISSECTOR HYDRO NUCLEUS 50X22 (MISCELLANEOUS) ×2 IMPLANT
GLOVE SURG LX 7.5 STRW (GLOVE) ×1
GLOVE SURG LX STRL 7.5 STRW (GLOVE) ×1 IMPLANT
GLOVE SURG SYN 8.5  E (GLOVE) ×1
GLOVE SURG SYN 8.5 E (GLOVE) ×1 IMPLANT
GOWN STRL REUS W/ TWL LRG LVL3 (GOWN DISPOSABLE) ×2 IMPLANT
GOWN STRL REUS W/TWL LRG LVL3 (GOWN DISPOSABLE) ×2
LENS IOL TECNIS ITEC 21.0 (Intraocular Lens) ×2 IMPLANT
MARKER SKIN DUAL TIP RULER LAB (MISCELLANEOUS) ×2 IMPLANT
PACK DR. KING ARMS (PACKS) ×2 IMPLANT
PACK EYE AFTER SURG (MISCELLANEOUS) ×2 IMPLANT
PACK OPTHALMIC (MISCELLANEOUS) ×2 IMPLANT
SYR 3ML LL SCALE MARK (SYRINGE) ×2 IMPLANT
SYR TB 1ML LUER SLIP (SYRINGE) ×2 IMPLANT
WATER STERILE IRR 500ML POUR (IV SOLUTION) ×2 IMPLANT
WIPE NON LINTING 3.25X3.25 (MISCELLANEOUS) ×2 IMPLANT

## 2018-12-19 NOTE — Op Note (Signed)
OPERATIVE NOTE  Garrett Hunter [id number] 12/19/2018   PREOPERATIVE DIAGNOSIS:  Nuclear sclerotic cataract left eye.  H25.12   POSTOPERATIVE DIAGNOSIS:    Nuclear sclerotic cataract left eye.     PROCEDURE:  Phacoemusification with posterior chamber intraocular lens placement of the left eye   LENS:   Implant Name Type Inv. Item Serial No. Manufacturer Lot No. LRB No. Used  LENS IOL DIOP 21.0 - [id number] Intraocular Lens LENS IOL DIOP 21.0 [id number] AMO  Left 1       PCB + 21.0  ULTRASOUND TIME:  0 minutes 39 seconds.  CDE 4.55   SURGEON:  Benay Pillow, MD, MPH   ANESTHESIA:  Topical with tetracaine drops augmented with 1% preservative-free intracameral lidocaine.  ESTIMATED BLOOD LOSS: <1 mL   COMPLICATIONS:  None.   DESCRIPTION OF PROCEDURE:  The patient was identified in the holding room and transported to the operating room and placed in the supine position under the operating microscope.  The left eye was identified as the operative eye and it was prepped and draped in the usual sterile ophthalmic fashion.   A 1.0 millimeter clear-corneal paracentesis was made at the 5:00 position. 0.5 ml of preservative-free 1% lidocaine with epinephrine was injected into the anterior chamber.  The anterior chamber was filled with Healon 5 viscoelastic.  A 2.4 millimeter keratome was used to make a near-clear corneal incision at the 2:00 position.  A curvilinear capsulorrhexis was made with a cystotome and capsulorrhexis forceps.  Balanced salt solution was used to hydrodissect and hydrodelineate the nucleus.   Phacoemulsification was then used in stop and chop fashion to remove the lens nucleus and epinucleus.  The remaining cortex was then removed using the irrigation and aspiration handpiece. Healon was then placed into the capsular bag to distend it for lens placement.  A lens was then injected into the capsular bag.  The remaining viscoelastic was aspirated.   Wounds were hydrated  with balanced salt solution.  The anterior chamber was inflated to a physiologic pressure with balanced salt solution.  Intracameral vigamox 0.1 mL undiltued was injected into the eye and a drop placed onto the ocular surface.  No wound leaks were noted.  The patient was taken to the recovery room in stable condition without complications of anesthesia or surgery  Benay Pillow 12/19/2018, 8:02 AM

## 2018-12-19 NOTE — Anesthesia Postprocedure Evaluation (Signed)
Anesthesia Post Note  Patient: Garrett Hunter  Procedure(s) Performed: CATARACT EXTRACTION PHACO AND INTRAOCULAR LENS PLACEMENT (IOC)  LEFT (Left Eye)  Patient location during evaluation: PACU Anesthesia Type: MAC Level of consciousness: awake and alert Pain management: pain level controlled Vital Signs Assessment: post-procedure vital signs reviewed and stable Respiratory status: spontaneous breathing Cardiovascular status: stable Anesthetic complications: no    Jaci Standard, III,  Avital Dancy D

## 2018-12-19 NOTE — Transfer of Care (Signed)
Immediate Anesthesia Transfer of Care Note  Patient: Garrett Hunter  Procedure(s) Performed: CATARACT EXTRACTION PHACO AND INTRAOCULAR LENS PLACEMENT (IOC)  LEFT (Left Eye)  Patient Location: PACU  Anesthesia Type: MAC  Level of Consciousness: awake, alert  and patient cooperative  Airway and Oxygen Therapy: Patient Spontanous Breathing and Patient connected to supplemental oxygen  Post-op Assessment: Post-op Vital signs reviewed, Patient's Cardiovascular Status Stable, Respiratory Function Stable, Patent Airway and No signs of Nausea or vomiting  Post-op Vital Signs: Reviewed and stable  Complications: No apparent anesthesia complications

## 2018-12-19 NOTE — H&P (Signed)

## 2018-12-19 NOTE — Anesthesia Procedure Notes (Signed)
Procedure Name: MAC Performed by: Monik Lins, CRNA Pre-anesthesia Checklist: Patient identified, Emergency Drugs available, Suction available, Timeout performed and Patient being monitored Patient Re-evaluated:Patient Re-evaluated prior to induction Oxygen Delivery Method: Nasal cannula Placement Confirmation: positive ETCO2       

## 2018-12-19 NOTE — Anesthesia Preprocedure Evaluation (Signed)
Anesthesia Evaluation  Patient identified by MRN, date of birth, ID band Patient awake    Reviewed: Allergy & Precautions, H&P , NPO status , Patient's Chart, lab work & pertinent test results  History of Anesthesia Complications Negative for: history of anesthetic complications  Airway Mallampati: II  TM Distance: >3 FB Neck ROM: full    Dental   Pulmonary COPD, former smoker,    Pulmonary exam normal breath sounds clear to auscultation       Cardiovascular hypertension, On Medications + CAD and + Past MI  Normal cardiovascular exam Rhythm:regular Rate:Normal  Last coronary stent placed 2014- denies CP/SOB.   Neuro/Psych negative neurological ROS     GI/Hepatic PUD,   Endo/Other  diabetes, Well Controlled, Type 2  Renal/GU negative Renal ROS  negative genitourinary   Musculoskeletal   Abdominal   Peds  Hematology negative hematology ROS (+)   Anesthesia Other Findings   Reproductive/Obstetrics                             Anesthesia Physical  Anesthesia Plan  ASA: II  Anesthesia Plan: MAC   Post-op Pain Management:    Induction:   PONV Risk Score and Plan:   Airway Management Planned:   Additional Equipment:   Intra-op Plan:   Post-operative Plan:   Informed Consent: I have reviewed the patients History and Physical, chart, labs and discussed the procedure including the risks, benefits and alternatives for the proposed anesthesia with the patient or authorized representative who has indicated his/her understanding and acceptance.       Plan Discussed with:   Anesthesia Plan Comments:         Anesthesia Quick Evaluation  

## 2018-12-20 ENCOUNTER — Encounter: Payer: Self-pay | Admitting: Ophthalmology

## 2019-02-02 ENCOUNTER — Other Ambulatory Visit: Payer: Self-pay | Admitting: Cardiovascular Disease

## 2019-02-04 ENCOUNTER — Other Ambulatory Visit: Payer: Self-pay | Admitting: Cardiovascular Disease

## 2019-02-28 ENCOUNTER — Encounter: Payer: Self-pay | Admitting: *Deleted

## 2019-02-28 ENCOUNTER — Other Ambulatory Visit: Payer: Self-pay

## 2019-03-02 ENCOUNTER — Other Ambulatory Visit
Admission: RE | Admit: 2019-03-02 | Discharge: 2019-03-02 | Disposition: A | Discharge status: Home / Self Care | Payer: Medicare Other | Source: Ambulatory Visit | Attending: Ophthalmology | Admitting: Ophthalmology

## 2019-03-02 ENCOUNTER — Other Ambulatory Visit: Payer: Self-pay

## 2019-03-02 DIAGNOSIS — Z1159 Encounter for screening for other viral diseases: Secondary | ICD-10-CM | POA: Insufficient documentation

## 2019-03-02 NOTE — Discharge Instructions (Signed)

## 2019-03-03 LAB — NOVEL CORONAVIRUS, NAA (HOSP ORDER, SEND-OUT TO REF LAB; TAT 18-24 HRS): SARS-CoV-2, NAA: NOT DETECTED

## 2019-03-06 ENCOUNTER — Ambulatory Visit
Admission: RE | Admit: 2019-03-06 | Discharge: 2019-03-06 | Disposition: A | Discharge status: Home / Self Care | Payer: Medicare Other | Attending: Ophthalmology | Admitting: Ophthalmology

## 2019-03-06 ENCOUNTER — Other Ambulatory Visit: Payer: Self-pay

## 2019-03-06 ENCOUNTER — Ambulatory Visit: Payer: Medicare Other | Admitting: Anesthesiology

## 2019-03-06 ENCOUNTER — Encounter
Admission: RE | Disposition: A | Discharge status: Home / Self Care | Payer: Self-pay | Source: Home / Self Care | Attending: Ophthalmology

## 2019-03-06 DIAGNOSIS — E1136 Type 2 diabetes mellitus with diabetic cataract: Secondary | ICD-10-CM | POA: Diagnosis not present

## 2019-03-06 DIAGNOSIS — Z888 Allergy status to other drugs, medicaments and biological substances status: Secondary | ICD-10-CM | POA: Insufficient documentation

## 2019-03-06 DIAGNOSIS — J449 Chronic obstructive pulmonary disease, unspecified: Secondary | ICD-10-CM | POA: Diagnosis not present

## 2019-03-06 DIAGNOSIS — H2511 Age-related nuclear cataract, right eye: Secondary | ICD-10-CM | POA: Diagnosis present

## 2019-03-06 DIAGNOSIS — I10 Essential (primary) hypertension: Secondary | ICD-10-CM | POA: Diagnosis not present

## 2019-03-06 DIAGNOSIS — Z955 Presence of coronary angioplasty implant and graft: Secondary | ICD-10-CM | POA: Insufficient documentation

## 2019-03-06 DIAGNOSIS — Z79899 Other long term (current) drug therapy: Secondary | ICD-10-CM | POA: Insufficient documentation

## 2019-03-06 DIAGNOSIS — I252 Old myocardial infarction: Secondary | ICD-10-CM | POA: Insufficient documentation

## 2019-03-06 DIAGNOSIS — Z7982 Long term (current) use of aspirin: Secondary | ICD-10-CM | POA: Insufficient documentation

## 2019-03-06 DIAGNOSIS — E7800 Pure hypercholesterolemia, unspecified: Secondary | ICD-10-CM | POA: Diagnosis not present

## 2019-03-06 DIAGNOSIS — Z87891 Personal history of nicotine dependence: Secondary | ICD-10-CM | POA: Insufficient documentation

## 2019-03-06 DIAGNOSIS — I2510 Atherosclerotic heart disease of native coronary artery without angina pectoris: Secondary | ICD-10-CM | POA: Diagnosis not present

## 2019-03-06 HISTORY — PX: CATARACT EXTRACTION W/PHACO: SHX586

## 2019-03-06 SURGERY — PHACOEMULSIFICATION, CATARACT, WITH IOL INSERTION
Anesthesia: Monitor Anesthesia Care | Site: Eye | Laterality: Right

## 2019-03-06 MED ORDER — SODIUM HYALURONATE 23 MG/ML IO SOLN
INTRAOCULAR | Status: DC | PRN
  Administered 2019-03-06: 0.6 mL via INTRAOCULAR

## 2019-03-06 MED ORDER — OXYCODONE HCL 5 MG/5ML PO SOLN: 5.0000 mg | Freq: Once | ORAL | Status: DC | PRN

## 2019-03-06 MED ORDER — TETRACAINE HCL 0.5 % OP SOLN
1.0000 [drp] | OPHTHALMIC | Status: DC | PRN
  Administered 2019-03-06 (×3): 1 [drp] via OPHTHALMIC

## 2019-03-06 MED ORDER — EPINEPHRINE PF 1 MG/ML IJ SOLN
INTRAOCULAR | Status: DC | PRN
  Administered 2019-03-06: 53 mL via OPHTHALMIC

## 2019-03-06 MED ORDER — PROMETHAZINE HCL 25 MG/ML IJ SOLN: 6.2500 mg | INTRAMUSCULAR | Status: DC | PRN

## 2019-03-06 MED ORDER — MOXIFLOXACIN HCL 0.5 % OP SOLN
OPHTHALMIC | Status: DC | PRN
  Administered 2019-03-06: 0.2 mL via OPHTHALMIC

## 2019-03-06 MED ORDER — LACTATED RINGERS IV SOLN: 10.0000 mL/h | INTRAVENOUS | Status: DC

## 2019-03-06 MED ORDER — MEPERIDINE HCL 25 MG/ML IJ SOLN: 6.2500 mg | INTRAMUSCULAR | Status: DC | PRN

## 2019-03-06 MED ORDER — OXYCODONE HCL 5 MG PO TABS: 5.0000 mg | ORAL_TABLET | Freq: Once | ORAL | Status: DC | PRN

## 2019-03-06 MED ORDER — FENTANYL CITRATE (PF) 100 MCG/2ML IJ SOLN
25.0000 ug | INTRAMUSCULAR | Status: DC | PRN
  Administered 2019-03-06: 50 ug via INTRAVENOUS

## 2019-03-06 MED ORDER — LIDOCAINE HCL (PF) 2 % IJ SOLN
INTRAOCULAR | Status: DC | PRN
  Administered 2019-03-06: 2 mL via INTRAOCULAR

## 2019-03-06 MED ORDER — MIDAZOLAM HCL 2 MG/2ML IJ SOLN
INTRAMUSCULAR | Status: DC | PRN
  Administered 2019-03-06: 1 mg via INTRAVENOUS

## 2019-03-06 MED ORDER — SODIUM HYALURONATE 10 MG/ML IO SOLN
INTRAOCULAR | Status: DC | PRN
  Administered 2019-03-06: 0.55 mL via INTRAOCULAR

## 2019-03-06 MED ORDER — ARMC OPHTHALMIC DILATING DROPS
1.0000 "application " | OPHTHALMIC | Status: DC | PRN
  Administered 2019-03-06 (×3): 1 via OPHTHALMIC

## 2019-03-06 SURGICAL SUPPLY — 19 items
CANNULA ANT/CHMB 27G (MISCELLANEOUS) ×2 IMPLANT
CANNULA ANT/CHMB 27GA (MISCELLANEOUS) ×4 IMPLANT
DISSECTOR HYDRO NUCLEUS 50X22 (MISCELLANEOUS) ×2 IMPLANT
GLOVE SURG LX 7.5 STRW (GLOVE) ×1
GLOVE SURG LX STRL 7.5 STRW (GLOVE) ×1 IMPLANT
GLOVE SURG SYN 8.5  E (GLOVE) ×1
GLOVE SURG SYN 8.5 E (GLOVE) ×1 IMPLANT
GLOVE SURG SYN 8.5 PF PI (GLOVE) ×1 IMPLANT
GOWN STRL REUS W/ TWL LRG LVL3 (GOWN DISPOSABLE) ×2 IMPLANT
GOWN STRL REUS W/TWL LRG LVL3 (GOWN DISPOSABLE) ×2
LENS IOL TECNIS ITEC 21.5 (Intraocular Lens) ×1 IMPLANT
MARKER SKIN DUAL TIP RULER LAB (MISCELLANEOUS) ×2 IMPLANT
PACK DR. KING ARMS (PACKS) ×2 IMPLANT
PACK EYE AFTER SURG (MISCELLANEOUS) ×2 IMPLANT
PACK OPTHALMIC (MISCELLANEOUS) ×2 IMPLANT
SYR 3ML LL SCALE MARK (SYRINGE) ×2 IMPLANT
SYR TB 1ML LUER SLIP (SYRINGE) ×2 IMPLANT
WATER STERILE IRR 250ML POUR (IV SOLUTION) ×2 IMPLANT
WIPE NON LINTING 3.25X3.25 (MISCELLANEOUS) ×2 IMPLANT

## 2019-03-06 NOTE — Anesthesia Procedure Notes (Signed)
Procedure Name: MAC Date/Time: 03/06/2019 11:22 AM Performed by: Cameron Ali, CRNA Pre-anesthesia Checklist: Patient identified, Emergency Drugs available, Suction available, Timeout performed and Patient being monitored Patient Re-evaluated:Patient Re-evaluated prior to induction Oxygen Delivery Method: Nasal cannula Placement Confirmation: positive ETCO2

## 2019-03-06 NOTE — Op Note (Signed)
OPERATIVE NOTE  ASAHD CAN [id number] 03/06/2019   PREOPERATIVE DIAGNOSIS:  Nuclear sclerotic cataract right eye.  H25.11   POSTOPERATIVE DIAGNOSIS:    Nuclear sclerotic cataract right eye.     PROCEDURE:  Phacoemusification with posterior chamber intraocular lens placement of the right eye   LENS:   Implant Name Type Inv. Item Serial No. Manufacturer Lot No. LRB No. Used  LENS IOL DIOP 21.5 - [id number] Intraocular Lens LENS IOL DIOP 21.5 [id number] AMO  Right 1       PCB00 +21.5   ULTRASOUND TIME: 0 minutes 32 seconds.  CDE 4.85   SURGEON:  Benay Pillow, MD, MPH  ANESTHESIOLOGIST: Anesthesiologist: Marice Potter, MD CRNA: Cameron Ali, CRNA   ANESTHESIA:  Topical with tetracaine drops augmented with 1% preservative-free intracameral lidocaine.  ESTIMATED BLOOD LOSS: less than 1 mL.   COMPLICATIONS:  None.   DESCRIPTION OF PROCEDURE:  The patient was identified in the holding room and transported to the operating room and placed in the supine position under the operating microscope.  The right eye was identified as the operative eye and it was prepped and draped in the usual sterile ophthalmic fashion.   A 1.0 millimeter clear-corneal paracentesis was made at the 10:30 position. 0.5 ml of preservative-free 1% lidocaine with epinephrine was injected into the anterior chamber.  The anterior chamber was filled with Healon 5 viscoelastic.  A 2.4 millimeter keratome was used to make a near-clear corneal incision at the 8:00 position.  A curvilinear capsulorrhexis was made with a cystotome and capsulorrhexis forceps.  Balanced salt solution was used to hydrodissect and hydrodelineate the nucleus.   Phacoemulsification was then used in stop and chop fashion to remove the lens nucleus and epinucleus.  The remaining cortex was then removed using the irrigation and aspiration handpiece. Healon was then placed into the capsular bag to distend it for lens placement.  A lens  was then injected into the capsular bag.  The remaining viscoelastic was aspirated.   Wounds were hydrated with balanced salt solution.  The anterior chamber was inflated to a physiologic pressure with balanced salt solution.   Intracameral vigamox 0.1 mL undiluted was injected into the eye and a drop placed onto the ocular surface.  No wound leaks were noted.  The patient was taken to the recovery room in stable condition without complications of anesthesia or surgery  Benay Pillow 03/06/2019, 11:42 AM

## 2019-03-06 NOTE — Transfer of Care (Signed)
Immediate Anesthesia Transfer of Care Note  Patient: Garrett Hunter  Procedure(s) Performed: CATARACT EXTRACTION PHACO AND INTRAOCULAR LENS PLACEMENT (IOC)RIGHT (Right Eye)  Patient Location: PACU  Anesthesia Type: MAC  Level of Consciousness: awake, alert  and patient cooperative  Airway and Oxygen Therapy: Patient Spontanous Breathing and Patient connected to supplemental oxygen  Post-op Assessment: Post-op Vital signs reviewed, Patient's Cardiovascular Status Stable, Respiratory Function Stable, Patent Airway and No signs of Nausea or vomiting  Post-op Vital Signs: Reviewed and stable  Complications: No apparent anesthesia complications

## 2019-03-06 NOTE — Anesthesia Postprocedure Evaluation (Signed)
Anesthesia Post Note  Patient: Garrett Hunter  Procedure(s) Performed: CATARACT EXTRACTION PHACO AND INTRAOCULAR LENS PLACEMENT (IOC)RIGHT (Right Eye)  Patient location during evaluation: PACU Anesthesia Type: MAC Level of consciousness: awake and alert Pain management: pain level controlled Vital Signs Assessment: post-procedure vital signs reviewed and stable Respiratory status: spontaneous breathing, nonlabored ventilation, respiratory function stable and patient connected to nasal cannula oxygen Cardiovascular status: stable and blood pressure returned to baseline Postop Assessment: no apparent nausea or vomiting Anesthetic complications: no    Constance Whittle ELAINE

## 2019-03-06 NOTE — Anesthesia Preprocedure Evaluation (Signed)
Anesthesia Evaluation  Patient identified by MRN, date of birth, ID band Patient awake    Reviewed: Allergy & Precautions, H&P , NPO status , Patient's Chart, lab work & pertinent test results  History of Anesthesia Complications Negative for: history of anesthetic complications  Airway Mallampati: II  TM Distance: >3 FB Neck ROM: full    Dental   Pulmonary COPD, former smoker,    Pulmonary exam normal breath sounds clear to auscultation       Cardiovascular hypertension, On Medications + CAD and + Past MI  Normal cardiovascular exam Rhythm:regular Rate:Normal  Last coronary stent placed 2014- denies CP/SOB.   Neuro/Psych negative neurological ROS     GI/Hepatic PUD,   Endo/Other  diabetes, Well Controlled, Type 2  Renal/GU negative Renal ROS  negative genitourinary   Musculoskeletal   Abdominal   Peds  Hematology negative hematology ROS (+)   Anesthesia Other Findings   Reproductive/Obstetrics                             Anesthesia Physical  Anesthesia Plan  ASA: II  Anesthesia Plan: MAC   Post-op Pain Management:    Induction:   PONV Risk Score and Plan:   Airway Management Planned:   Additional Equipment:   Intra-op Plan:   Post-operative Plan:   Informed Consent: I have reviewed the patients History and Physical, chart, labs and discussed the procedure including the risks, benefits and alternatives for the proposed anesthesia with the patient or authorized representative who has indicated his/her understanding and acceptance.       Plan Discussed with:   Anesthesia Plan Comments:         Anesthesia Quick Evaluation

## 2019-03-06 NOTE — H&P (Signed)

## 2019-03-07 ENCOUNTER — Encounter: Payer: Self-pay | Admitting: Ophthalmology

## 2019-05-04 ENCOUNTER — Other Ambulatory Visit: Payer: Self-pay | Admitting: Cardiovascular Disease

## 2019-07-04 ENCOUNTER — Ambulatory Visit (INDEPENDENT_AMBULATORY_CARE_PROVIDER_SITE_OTHER): Payer: Medicare Other | Admitting: Cardiovascular Disease

## 2019-07-04 ENCOUNTER — Other Ambulatory Visit: Payer: Self-pay

## 2019-07-04 VITALS — BP 148/70 | HR 73 | Ht 70.0 in | Wt 233.0 lb

## 2019-07-04 DIAGNOSIS — I5022 Chronic systolic (congestive) heart failure: Secondary | ICD-10-CM

## 2019-07-04 DIAGNOSIS — E785 Hyperlipidemia, unspecified: Secondary | ICD-10-CM | POA: Diagnosis not present

## 2019-07-04 DIAGNOSIS — I10 Essential (primary) hypertension: Secondary | ICD-10-CM | POA: Diagnosis not present

## 2019-07-04 DIAGNOSIS — I2510 Atherosclerotic heart disease of native coronary artery without angina pectoris: Secondary | ICD-10-CM

## 2019-07-04 NOTE — Progress Notes (Signed)
Cardiology Office Note   Date:  07/04/2019   ID:  Tice, Mcmeans Apr 27, 1946, MRN [id number]  PCP:  Derinda Late, MD  Cardiologist:   Kathlyn Sacramento, MD   Chief Complaint  Patient presents with  . other    12 mo f/u. Medications reviewed verbally.       History of Present Illness: Garrett Hunter is a 73 y.o. male who presents for a followup visit regarding coronary artery disease and abdominal aortic aneurysm.   He has known history of coronary artery disease status post inferior myocardial infarction in 1996. He was treated with TPA at that time .  He is known to have chronically occluded right coronary artery and small left circumflex with good collaterals. He had ostial ramus artery drug-eluting stent placement in February 2014 with no cardiac events since then.  Most recent echocardiogram in January 2018 showed low normal LV systolic function with an EF of 50-55% with hypokinesis of the basal inferior myocardium with borderline dilated aortic root at 3.9 cm and no evidence of pulmonary hypertension.  He had endovascular repair of abdominal aortic aneurysm in February 2018 by Dr. Sammuel Hines at [facility]   He was hospitalized in October 2019 with upper GI bleed with melena.  He had an EGD done which showed a gastric ulcer that was cauterized.  His Plavix was discontinued.  He has been doing very well from a cardiac standpoint with no recent chest pain, shortness of breath or palpitations.  Past Medical History:  Diagnosis Date  . AAA (abdominal aortic aneurysm) ([place])    Small noted during cardiac catheterization in 2011.  [name] Benign neoplasm of colon   . CHF (congestive heart failure) ([place])   . Chronic airway obstruction, not elsewhere classified   . Coronary artery disease    Inferior MI in 1996 treated with TPA. Cardiac cath in 2011 at [facility] showed chronic occlusion of the proximal RCA and proximal left circumflex without obstructive disease in the LAD. Non-ST  elevation myocardial infarction in October 2013 while on vacation in [place]. Cardiac catheterization showed plaque rupture in the proximal ramus with thrombus. He had balloon angioplasty done.   . Coronary atherosclerosis of native coronary artery   . Hepatitis A    teenager  . Hyperlipidemia    Intolerance to statins.  . Hypertension   . MI (myocardial infarction) ([place])    x 3 - last one 2013    Past Surgical History:  Procedure Laterality Date  . ABDOMINAL AORTIC ANEURYSM REPAIR     11/16/2016  . ANGIOPLASTY    . CARDIAC CATHETERIZATION  2011  . CARDIAC CATHETERIZATION  2/14   [facility]; 95% lesion in the ramus intermedius.   [name] CATARACT EXTRACTION W/PHACO Left 12/19/2018   Procedure: CATARACT EXTRACTION PHACO AND INTRAOCULAR LENS PLACEMENT ([place])  LEFT;  Surgeon: Eulogio Bear, MD;  Location: [place];  Service: Ophthalmology;  Laterality: Left;  . CATARACT EXTRACTION W/PHACO Right 03/06/2019   Procedure: CATARACT EXTRACTION PHACO AND INTRAOCULAR LENS PLACEMENT (IOC)RIGHT;  Surgeon: Eulogio Bear, MD;  Location: [place];  Service: Ophthalmology;  Laterality: Right;  . CORONARY ANGIOPLASTY  1996   [facility] x1  . CORONARY ANGIOPLASTY  2/14   Severe restenosis in the ostial ramus. Status post angioplasty and drug-eluting stent placement with a 2.5 x 18 mm Xience drug-eluting stent  . ESOPHAGOGASTRODUODENOSCOPY N/A 08/03/2018   Procedure: ESOPHAGOGASTRODUODENOSCOPY (EGD);  Surgeon: Lin Landsman, MD;  Location: [facility] ENDOSCOPY;  Service:  Gastroenterology;  Laterality: N/A;  . ESOPHAGOGASTRODUODENOSCOPY (EGD) WITH PROPOFOL N/A 11/03/2018   Procedure: ESOPHAGOGASTRODUODENOSCOPY (EGD) WITH PROPOFOL;  Surgeon: Lin Landsman, MD;  Location[facility] ENDOSCOPY;  Service: Gastroenterology;  Laterality: N/A;  . ESOPHAGOGASTRODUODENOSCOPY (EGD) WITH PROPOFOL N/A 12/06/2018   Procedure: ESOPHAGOGASTRODUODENOSCOPY (EGD) WITH PROPOFOL;  Surgeon: Lucilla Lame, MD;  Location:  [facility] ENDOSCOPY;  Service: Endoscopy;  Laterality: N/A;  . TONSILLECTOMY AND ADENOIDECTOMY       Current Outpatient Medications  Medication Sig Dispense Refill  . acetaminophen (TYLENOL) 325 MG tablet Take 650 mg by mouth as needed for mild pain or headache.     [name] aspirin EC 81 MG tablet Take 81 mg by mouth daily.    . carvedilol (COREG) 12.5 MG tablet TAKE 1 TABLET TWICE A DAY 180 tablet 0  . Coenzyme Q10 (COQ-10) 100 MG CAPS Take 100 mg by mouth daily.     . folic acid (FOLVITE) Q000111Q MCG tablet Take 800 mcg by mouth daily.    Javier Docker Oil 1000 MG CAPS Take 1,000 mg by mouth daily.     [name] lisinopril-hydrochlorothiazide (PRINZIDE,ZESTORETIC) 20-12.5 MG tablet Take 2 tablets by mouth daily.     . nitroGLYCERIN (NITROSTAT) 0.4 MG SL tablet Place 0.4 mg under the tongue every 5 (five) minutes as needed for chest pain.    . rosuvastatin (CRESTOR) 5 MG tablet TAKE 1 TABLET DAILY 90 tablet 0  . saw palmetto 160 MG capsule Take 160 mg by mouth 2 (two) times daily.    . vitamin B-12 (CYANOCOBALAMIN) 1000 MCG tablet Take 1,000 mcg by mouth daily.    . vitamin C (ASCORBIC ACID) 500 MG tablet Take 500 mg by mouth daily.     No current facility-administered medications for this visit.     Allergies:   Crestor [rosuvastatin], Livalo [pitavastatin], Pravachol [pravastatin sodium], and Zocor [simvastatin]    Social History:  The patient  reports that he quit smoking about 16 years ago. His smoking use included cigarettes. He has a 80.00 pack-year smoking history. He has quit using smokeless tobacco.  His smokeless tobacco use included chew. He reports current alcohol use of about 3.0 standard drinks of alcohol per week. He reports that he does not use drugs.   Family History:  The patient's family history includes Cancer (age of onset: 14) in his mother; Heart attack in his father; Hypertension in his brother and sister.    ROS:  Please see the history of present illness.   Otherwise, review of systems  are positive for none.   All other systems are reviewed and negative.    PHYSICAL EXAM: VS:  BP (!) 148/70 (BP Location: Left Arm, Patient Position: Sitting, Cuff Size: Normal)   Pulse 73   Ht 5\' 10"  (1.778 m)   Wt 233 lb (105.7 kg)   BMI 33.43 kg/m  , BMI Body mass index is 33.43 kg/m. GEN: Well nourished, well developed, in no acute distress  HEENT: normal  Neck: no JVD, carotid bruits, or masses Cardiac: RRR; no murmurs, rubs, or gallops,no edema  Respiratory:  clear to auscultation bilaterally, normal work of breathing GI: soft, nontender, nondistended, + BS MS: no deformity or atrophy  Skin: warm and dry, no rash Neuro:  Strength and sensation are intact Psych: euthymic mood, full affect   EKG:  EKG is ordered today. The ekg ordered today demonstrates normal sinus rhythm with left axis deviation and nonspecific IVCD.  Recent Labs: 08/02/2018: ALT 20 08/03/2018: BUN 49; Creatinine, Ser  1.12; Potassium 3.6; Sodium 139 09/12/2018: Hemoglobin 12.1; Platelets 296    Lipid Panel    Component Value Date/Time   CHOL 121 09/13/2012 0819   TRIG 78 09/13/2012 0819   HDL 37 (L) 09/13/2012 0819   CHOLHDL 3.3 09/13/2012 0819   LDLCALC 68 09/13/2012 0819      Wt Readings from Last 3 Encounters:  07/04/19 233 lb (105.7 kg)  03/06/19 230 lb (104.3 kg)  12/06/18 235 lb (106.6 kg)        ASSESSMENT AND PLAN:  1.  Coronary artery disease involving native coronary arteries without angina: He is doing extremely well with no anginal symptoms.  Plavix was discontinued last year after he had upper GI bleed.  Continue aspirin indefinitely.  2. Chronic systolic heart failure: Continue treatment with carvedilol and benazepril.  Most recent ejection fraction was 50-55%.  3. Abdominal aortic aneurysm:  Status post  endovascular repair. Followed at [facility].   4. Hyperlipidemia: He has known intolerance to statins but has been able to tolerate small dose rosuvastatin. I reviewed most  recent lipid profile which showed an LDL of 66.  5. Essential hypertension: Blood pressure is mildly elevated but usually his blood pressure is controlled and thus I made no changes today.   Disposition:   FU with me in 12 months.   Signed,  Kathlyn Sacramento, MD  07/04/2019 4:47 PM    [place]

## 2019-07-04 NOTE — Patient Instructions (Signed)
Medication Instructions:  Your physician recommends that you continue on your current medications as directed. Please refer to the Current Medication list given to you today.  If you need a refill on your cardiac medications before your next appointment, please call your pharmacy.   Lab work: None ordered If you have labs (blood work) drawn today and your tests are completely normal, you will receive your results only by: . MyChart Message (if you have MyChart) OR . A paper copy in the mail If you have any lab test that is abnormal or we need to change your treatment, we will call you to review the results.  Testing/Procedures: None ordered  Follow-Up: At [facility], you and your health needs are our priority.  As part of our continuing mission to provide you with exceptional heart care, we have created designated Provider Care Teams.  These Care Teams include your primary Cardiologist (physician) and Advanced Practice Providers (APPs -  Physician Assistants and Nurse Practitioners) who all work together to provide you with the care you need, when you need it. You will need a follow up appointment in 12 months.  Please call our office 2 months in advance to schedule this appointment.  You may see  Dr. Arida or one of the following Advanced Practice Providers on your designated Care Team:   Christopher Berge, NP Ryan Dunn, PA-C . Jacquelyn Visser, PA-C  Any Other Special Instructions Will Be Listed Below (If Applicable). N/A   

## 2019-07-29 ENCOUNTER — Other Ambulatory Visit: Payer: Self-pay | Admitting: Cardiovascular Disease

## 2019-08-14 ENCOUNTER — Other Ambulatory Visit: Payer: Self-pay | Admitting: Cardiovascular Disease

## 2019-11-12 ENCOUNTER — Other Ambulatory Visit: Payer: Self-pay | Admitting: Cardiovascular Disease

## 2020-02-27 LAB — COLOGUARD: COLOGUARD: NEGATIVE

## 2020-05-04 ENCOUNTER — Other Ambulatory Visit: Payer: Self-pay | Admitting: Cardiovascular Disease

## 2020-06-17 ENCOUNTER — Telehealth: Payer: Self-pay | Admitting: Cardiovascular Disease

## 2020-06-17 MED ORDER — LISINOPRIL-HYDROCHLOROTHIAZIDE 20-12.5 MG PO TABS: 2.0000 | ORAL_TABLET | Freq: Every day | ORAL | 0 refills | Status: DC

## 2020-06-17 NOTE — Telephone Encounter (Signed)
Called patient and we discussed the lisinopril/HCTZ 20-12.5, takes 2 tablets once a day. He is in [facility] and forgot his medication. We looked back at Dr Abbe Amsterdam records and patient's last office visit.  I am able to send in 5 tablets for him. He was appreciative.

## 2020-06-17 NOTE — Telephone Encounter (Signed)
Please advise if OK to refill. Listed under Historical Provider. Medication not mentioned in Dr. Tyrell Antonio last office note. Thank you!

## 2020-06-17 NOTE — Telephone Encounter (Signed)
*  STAT* If patient is at the pharmacy, call can be transferred to refill team.   1. Which medications need to be refilled? (please list name of each medication and dose if known) lisinopril 12.5 MG 2 tablets daily   2. Which pharmacy/location (including street and city if local pharmacy) is medication to be sent to? [facility] in [place] phone [phone]  3. Do they need a 30 day or 90 day supply? About a 5 day supply - patient is out of town and forgot medication

## 2020-07-16 ENCOUNTER — Other Ambulatory Visit: Payer: Self-pay

## 2020-07-16 ENCOUNTER — Ambulatory Visit (INDEPENDENT_AMBULATORY_CARE_PROVIDER_SITE_OTHER): Payer: Medicare Other | Admitting: Cardiovascular Disease

## 2020-07-16 ENCOUNTER — Encounter: Payer: Self-pay | Admitting: Cardiovascular Disease

## 2020-07-16 VITALS — BP 144/72 | HR 73 | Ht 71.0 in | Wt 229.0 lb

## 2020-07-16 DIAGNOSIS — I5022 Chronic systolic (congestive) heart failure: Secondary | ICD-10-CM

## 2020-07-16 DIAGNOSIS — E785 Hyperlipidemia, unspecified: Secondary | ICD-10-CM | POA: Diagnosis not present

## 2020-07-16 DIAGNOSIS — I714 Abdominal aortic aneurysm, without rupture, unspecified: Secondary | ICD-10-CM

## 2020-07-16 DIAGNOSIS — I10 Essential (primary) hypertension: Secondary | ICD-10-CM

## 2020-07-16 DIAGNOSIS — I2510 Atherosclerotic heart disease of native coronary artery without angina pectoris: Secondary | ICD-10-CM | POA: Diagnosis not present

## 2020-07-16 NOTE — Patient Instructions (Signed)
Medication Instructions:  Your physician recommends that you continue on your current medications as directed. Please refer to the Current Medication list given to you today.  *If you need a refill on your cardiac medications before your next appointment, please call your pharmacy*   Lab Work: None ordered If you have labs (blood work) drawn today and your tests are completely normal, you will receive your results only by: . MyChart Message (if you have MyChart) OR . A paper copy in the mail If you have any lab test that is abnormal or we need to change your treatment, we will call you to review the results.   Testing/Procedures: None ordered   Follow-Up: At [facility], you and your health needs are our priority.  As part of our continuing mission to provide you with exceptional heart care, we have created designated Provider Care Teams.  These Care Teams include your primary Cardiologist (physician) and Advanced Practice Providers (APPs -  Physician Assistants and Nurse Practitioners) who all work together to provide you with the care you need, when you need it.  We recommend signing up for the patient portal called "MyChart".  Sign up information is provided on this After Visit Summary.  MyChart is used to connect with patients for Virtual Visits (Telemedicine).  Patients are able to view lab/test results, encounter notes, upcoming appointments, etc.  Non-urgent messages can be sent to your provider as well.   To learn more about what you can do with MyChart, go to [email or web address].com.    Your next appointment:   12 month(s)  The format for your next appointment:   In Person  Provider:   You may see Dr. Arida or one of the following Advanced Practice Providers on your designated Care Team:    Christopher Berge, NP  Ryan Dunn, PA-C  Jacquelyn Visser, PA-C  Cadence Furth, PA-C    Other Instructions N/A  

## 2020-07-16 NOTE — Progress Notes (Signed)
Cardiology Office Note   Date:  07/16/2020   ID:  Azariel, Banik November 26, 1945, MRN [id number]  PCP:  Derinda Late, MD  Cardiologist:   Kathlyn Sacramento, MD   Chief Complaint  Patient presents with   Other    12 month follow up. Meds reviewed verbally with patient.       History of Present Illness: HERMANN DOTTAVIO is a 74 y.o. male who presents for a followup visit regarding coronary artery disease and abdominal aortic aneurysm.   He has known history of coronary artery disease status post inferior myocardial infarction in 1996. He was treated with TPA at that time .  He is known to have chronically occluded right coronary artery and small left circumflex with good collaterals. He had ostial ramus artery drug-eluting stent placement in February 2014 with no cardiac events since then.  Most recent echocardiogram in January 2018 showed low normal LV systolic function with an EF of 50-55% with hypokinesis of the basal inferior myocardium with borderline dilated aortic root at 3.9 cm and no evidence of pulmonary hypertension.  He had endovascular repair of abdominal aortic aneurysm in February 2018 by Dr. Sammuel Hines at [facility].   He was hospitalized in October 2019 with upper GI bleed with melena.  He had an EGD done which showed a gastric ulcer that was cauterized.  His Plavix was discontinued at that time. He has been doing extremely well with no recent chest pain, shortness of breath or palpitations.  Few months ago, he had some bluish discoloration of both hands but that did not last long and he has not had any recurrent symptoms.  Past Medical History:  Diagnosis Date   AAA (abdominal aortic aneurysm) ([place])    Small noted during cardiac catheterization in 2011.   Benign neoplasm of colon    CHF (congestive heart failure) (H[facility])    Chronic airway obstruction, not elsewhere classified    Coronary artery disease    Inferior MI in 1996 treated with TPA. Cardiac cath in  2011 at [facility] showed chronic occlusion of the proximal RCA and proximal left circumflex without obstructive disease in the LAD. Non-ST elevation myocardial infarction in October 2013 while on vacation in [place]. Cardiac catheterization showed plaque rupture in the proximal ramus with thrombus. He had balloon angioplasty done.    Coronary atherosclerosis of native coronary artery    Hepatitis A    teenager   Hyperlipidemia    Intolerance to statins.   Hypertension    MI (myocardial infarction) ([place])    x 3 - last one 2013    Past Surgical History:  Procedure Laterality Date   ABDOMINAL AORTIC ANEURYSM REPAIR     11/16/2016   ANGIOPLASTY     CARDIAC CATHETERIZATION  2011   CARDIAC CATHETERIZATION  2/14   [facility]; 95% lesion in the ramus intermedius.    CATARACT EXTRACTION W/PHACO Left 12/19/2018   Procedure: CATARACT EXTRACTION PHACO AND INTRAOCULAR LENS PLACEMENT ([place])  LEFT;  Surgeon: Eulogio Bear, MD;  Location: [place];  Service: Ophthalmology;  Laterality: Left;   CATARACT EXTRACTION W/PHACO Right 03/06/2019   Procedure: CATARACT EXTRACTION PHACO AND INTRAOCULAR LENS PLACEMENT (IOC)RIGHT;  Surgeon: Eulogio Bear, MD;  Location: [place];  Service: Ophthalmology;  Laterality: Right;   CORONARY ANGIOPLASTY  1996   [facility] x1   CORONARY ANGIOPLASTY  2/14   Severe restenosis in the ostial ramus. Status post angioplasty and drug-eluting stent placement with a 2.5 x  18 mm Xience drug-eluting stent   ESOPHAGOGASTRODUODENOSCOPY N/A 08/03/2018   Procedure: ESOPHAGOGASTRODUODENOSCOPY (EGD);  Surgeon: Lin Landsman, MD;  Location: [facility] ENDOSCOPY;  Service: Gastroenterology;  Laterality: N/A;   ESOPHAGOGASTRODUODENOSCOPY (EGD) WITH PROPOFOL N/A 11/03/2018   Procedure: ESOPHAGOGASTRODUODENOSCOPY (EGD) WITH PROPOFOL;  Surgeon: Lin Landsman, MD;  Location: [facility] ENDOSCOPY;  Service: Gastroenterology;  Laterality: N/A;   ESOPHAGOGASTRODUODENOSCOPY  (EGD) WITH PROPOFOL N/A 12/06/2018   Procedure: ESOPHAGOGASTRODUODENOSCOPY (EGD) WITH PROPOFOL;  Surgeon: Lucilla Lame, MD;  Location: [facility] ENDOSCOPY;  Service: Endoscopy;  Laterality: N/A;   TONSILLECTOMY AND ADENOIDECTOMY       Current Outpatient Medications  Medication Sig Dispense Refill   acetaminophen (TYLENOL) 325 MG tablet Take 650 mg by mouth as needed for mild pain or headache.      aspirin EC 81 MG tablet Take 81 mg by mouth daily.     carvedilol (COREG) 12.5 MG tablet TAKE 1 TABLET TWICE A DAY 180 tablet 3   Cholecalciferol (VITAMIN D3) 1.25 MG (50000 UT) CAPS Take 1.25 mg by mouth daily.     Coenzyme Q10 (COQ-10) 100 MG CAPS Take 100 mg by mouth daily.      Krill Oil 1000 MG CAPS Take 1,000 mg by mouth daily.      lisinopril-hydrochlorothiazide (ZESTORETIC) 20-12.5 MG tablet Take 2 tablets by mouth daily. 10 tablet 0   nitroGLYCERIN (NITROSTAT) 0.4 MG SL tablet Place 0.4 mg under the tongue every 5 (five) minutes as needed for chest pain.     rosuvastatin (CRESTOR) 5 MG tablet TAKE 1 TABLET DAILY 90 tablet 0   zinc gluconate 50 MG tablet Take 50 mg by mouth daily.     No current facility-administered medications for this visit.    Allergies:   Crestor [rosuvastatin], Livalo [pitavastatin], Pravachol [pravastatin sodium], and Zocor [simvastatin]    Social History:  The patient  reports that he quit smoking about 17 years ago. His smoking use included cigarettes. He has a 80.00 pack-year smoking history. He has quit using smokeless tobacco.  His smokeless tobacco use included chew. He reports current alcohol use of about 3.0 standard drinks of alcohol per week. He reports that he does not use drugs.   Family History:  The patient's family history includes Cancer (age of onset: 26) in his mother; Heart attack in his father; Hypertension in his brother and sister.    ROS:  Please see the history of present illness.   Otherwise, review of systems are positive for none.    All other systems are reviewed and negative.    PHYSICAL EXAM: VS:  BP (!) 144/72 (BP Location: Left Arm, Patient Position: Sitting, Cuff Size: Normal)    Pulse 73    Ht 5\' 11"  (1.803 m)    Wt 229 lb (103.9 kg)    SpO2 96%    BMI 31.94 kg/m  , BMI Body mass index is 31.94 kg/m. GEN: Well nourished, well developed, in no acute distress  HEENT: normal  Neck: no JVD, carotid bruits, or masses Cardiac: RRR; no murmurs, rubs, or gallops,no edema  Respiratory:  clear to auscultation bilaterally, normal work of breathing GI: soft, nontender, nondistended, + BS MS: no deformity or atrophy  Skin: warm and dry, no rash Neuro:  Strength and sensation are intact Psych: euthymic mood, full affect Radial pulses normal bilaterally.  Ulnar pulses slightly diminished bilaterally.   EKG:  EKG is ordered today. The ekg ordered today demonstrates normal sinus rhythm with nonspecific IVCD and possible old inferior  infarct  Recent Labs: No results found for requested labs within last 8760 hours.    Lipid Panel    Component Value Date/Time   CHOL 121 09/13/2012 0819   TRIG 78 09/13/2012 0819   HDL 37 (L) 09/13/2012 0819   CHOLHDL 3.3 09/13/2012 0819   LDLCALC 68 09/13/2012 0819      Wt Readings from Last 3 Encounters:  07/16/20 229 lb (103.9 kg)  07/04/19 233 lb (105.7 kg)  03/06/19 230 lb (104.3 kg)        ASSESSMENT AND PLAN:  1.  Coronary artery disease involving native coronary arteries without angina: He is doing extremely well with no anginal symptoms.  Continue aspirin indefinitely.  2. Chronic systolic heart failure: Continue treatment with carvedilol and benazepril.  Most recent ejection fraction was 50-55%.  No evidence of volume overload.  3. Abdominal aortic aneurysm:  Status post  endovascular repair. Followed at [facility] by Dr. Sammuel Hines.   4. Hyperlipidemia: He has known intolerance to statins but has been able to tolerate small dose rosuvastatin.  His LDL has been  consistently below 70 and I did review his recent labs done in May.  5. Essential hypertension: Blood pressure is reasonably controlled on current medications.   Disposition:   FU with me in 12 months.   Signed,  Kathlyn Sacramento, MD  07/16/2020 1:56 PM    [place]

## 2020-08-01 ENCOUNTER — Other Ambulatory Visit: Payer: Self-pay | Admitting: Cardiovascular Disease

## 2021-01-03 DIAGNOSIS — I639 Cerebral infarction, unspecified: Secondary | ICD-10-CM

## 2021-01-03 HISTORY — DX: Cerebral infarction, unspecified: I63.9

## 2021-01-27 ENCOUNTER — Inpatient Hospital Stay
Admission: EM | Admit: 2021-01-27 | Discharge: 2021-01-28 | DRG: 287 | Disposition: A | Discharge status: Other Acute Inpatient Hospital | Payer: Medicare Other | Source: Other Acute Inpatient Hospital | Attending: Internal Medicine | Admitting: Internal Medicine

## 2021-01-27 ENCOUNTER — Other Ambulatory Visit: Payer: Self-pay

## 2021-01-27 ENCOUNTER — Emergency Department: Payer: Medicare Other

## 2021-01-27 DIAGNOSIS — I5022 Chronic systolic (congestive) heart failure: Secondary | ICD-10-CM | POA: Diagnosis present

## 2021-01-27 DIAGNOSIS — I472 Ventricular tachycardia, unspecified: Secondary | ICD-10-CM

## 2021-01-27 DIAGNOSIS — Z87891 Personal history of nicotine dependence: Secondary | ICD-10-CM

## 2021-01-27 DIAGNOSIS — E669 Obesity, unspecified: Secondary | ICD-10-CM | POA: Diagnosis present

## 2021-01-27 DIAGNOSIS — I714 Abdominal aortic aneurysm, without rupture, unspecified: Secondary | ICD-10-CM | POA: Diagnosis present

## 2021-01-27 DIAGNOSIS — I10 Essential (primary) hypertension: Secondary | ICD-10-CM

## 2021-01-27 DIAGNOSIS — Z8249 Family history of ischemic heart disease and other diseases of the circulatory system: Secondary | ICD-10-CM

## 2021-01-27 DIAGNOSIS — E119 Type 2 diabetes mellitus without complications: Secondary | ICD-10-CM | POA: Diagnosis present

## 2021-01-27 DIAGNOSIS — Z86010 Personal history of colonic polyps: Secondary | ICD-10-CM | POA: Diagnosis not present

## 2021-01-27 DIAGNOSIS — Z6831 Body mass index (BMI) 31.0-31.9, adult: Secondary | ICD-10-CM

## 2021-01-27 DIAGNOSIS — I2582 Chronic total occlusion of coronary artery: Secondary | ICD-10-CM | POA: Diagnosis present

## 2021-01-27 DIAGNOSIS — I248 Other forms of acute ischemic heart disease: Secondary | ICD-10-CM | POA: Diagnosis present

## 2021-01-27 DIAGNOSIS — J449 Chronic obstructive pulmonary disease, unspecified: Secondary | ICD-10-CM | POA: Diagnosis present

## 2021-01-27 DIAGNOSIS — I2510 Atherosclerotic heart disease of native coronary artery without angina pectoris: Secondary | ICD-10-CM | POA: Diagnosis present

## 2021-01-27 DIAGNOSIS — Z888 Allergy status to other drugs, medicaments and biological substances status: Secondary | ICD-10-CM | POA: Diagnosis not present

## 2021-01-27 DIAGNOSIS — I252 Old myocardial infarction: Secondary | ICD-10-CM

## 2021-01-27 DIAGNOSIS — I63412 Cerebral infarction due to embolism of left middle cerebral artery: Secondary | ICD-10-CM | POA: Diagnosis not present

## 2021-01-27 DIAGNOSIS — Z7982 Long term (current) use of aspirin: Secondary | ICD-10-CM

## 2021-01-27 DIAGNOSIS — I255 Ischemic cardiomyopathy: Secondary | ICD-10-CM | POA: Diagnosis present

## 2021-01-27 DIAGNOSIS — Z955 Presence of coronary angioplasty implant and graft: Secondary | ICD-10-CM | POA: Diagnosis not present

## 2021-01-27 DIAGNOSIS — I110 Hypertensive heart disease with heart failure: Secondary | ICD-10-CM | POA: Diagnosis present

## 2021-01-27 DIAGNOSIS — E782 Mixed hyperlipidemia: Secondary | ICD-10-CM | POA: Diagnosis not present

## 2021-01-27 DIAGNOSIS — Z20822 Contact with and (suspected) exposure to covid-19: Secondary | ICD-10-CM | POA: Diagnosis present

## 2021-01-27 DIAGNOSIS — Z79899 Other long term (current) drug therapy: Secondary | ICD-10-CM | POA: Diagnosis not present

## 2021-01-27 DIAGNOSIS — E785 Hyperlipidemia, unspecified: Secondary | ICD-10-CM | POA: Diagnosis present

## 2021-01-27 DIAGNOSIS — N179 Acute kidney failure, unspecified: Secondary | ICD-10-CM

## 2021-01-27 DIAGNOSIS — I6522 Occlusion and stenosis of left carotid artery: Secondary | ICD-10-CM | POA: Diagnosis not present

## 2021-01-27 DIAGNOSIS — I739 Peripheral vascular disease, unspecified: Secondary | ICD-10-CM | POA: Diagnosis not present

## 2021-01-27 DIAGNOSIS — E7800 Pure hypercholesterolemia, unspecified: Secondary | ICD-10-CM | POA: Diagnosis not present

## 2021-01-27 LAB — CBC
HCT: 44.2 % (ref 39.0–52.0)
Hemoglobin: 14.8 g/dL (ref 13.0–17.0)
MCH: 33 pg (ref 26.0–34.0)
MCHC: 33.5 g/dL (ref 30.0–36.0)
MCV: 98.4 fL (ref 80.0–100.0)
Platelets: 213 10*3/uL (ref 150–400)
RBC: 4.49 MIL/uL (ref 4.22–5.81)
RDW: 13 % (ref 11.5–15.5)
WBC: 11.4 10*3/uL — ABNORMAL HIGH (ref 4.0–10.5)
nRBC: 0 % (ref 0.0–0.2)

## 2021-01-27 LAB — COMPREHENSIVE METABOLIC PANEL
ALT: 32 U/L (ref 0–44)
AST: 32 U/L (ref 15–41)
Albumin: 4.5 g/dL (ref 3.5–5.0)
Alkaline Phosphatase: 76 U/L (ref 38–126)
Anion gap: 12 (ref 5–15)
BUN: 23 mg/dL (ref 8–23)
CO2: 25 mmol/L (ref 22–32)
Calcium: 9.9 mg/dL (ref 8.9–10.3)
Chloride: 101 mmol/L (ref 98–111)
Creatinine, Ser: 1.39 mg/dL — ABNORMAL HIGH (ref 0.61–1.24)
GFR, Estimated: 54 mL/min — ABNORMAL LOW (ref >60)
Glucose, Bld: 173 mg/dL — ABNORMAL HIGH (ref 70–99)
Potassium: 4.3 mmol/L (ref 3.5–5.1)
Sodium: 138 mmol/L (ref 135–145)
Total Bilirubin: 0.7 mg/dL (ref 0.3–1.2)
Total Protein: 7.1 g/dL (ref 6.5–8.1)

## 2021-01-27 LAB — TROPONIN I (HIGH SENSITIVITY)
Troponin I (High Sensitivity): 32 ng/L — ABNORMAL HIGH (ref <18)
Troponin I (High Sensitivity): 116 ng/L (ref <18)
Troponin I (High Sensitivity): 218 ng/L (ref <18)

## 2021-01-27 LAB — MAGNESIUM: Magnesium: 2.2 mg/dL (ref 1.7–2.4)

## 2021-01-27 LAB — RESP PANEL BY RT-PCR (FLU A&B, COVID) ARPGX2
Influenza A by PCR: NEGATIVE
Influenza B by PCR: NEGATIVE
SARS Coronavirus 2 by RT PCR: NEGATIVE

## 2021-01-27 LAB — PROTIME-INR
INR: 1 (ref 0.8–1.2)
Prothrombin Time: 12.6 seconds (ref 11.4–15.2)

## 2021-01-27 LAB — APTT: aPTT: 29 seconds (ref 24–36)

## 2021-01-27 LAB — GLUCOSE, CAPILLARY: Glucose-Capillary: 253 mg/dL — ABNORMAL HIGH (ref 70–99)

## 2021-01-27 LAB — MRSA PCR SCREENING: MRSA by PCR: POSITIVE — AB

## 2021-01-27 MED ORDER — AMIODARONE IV BOLUS ONLY 150 MG/100ML
150.0000 mg | Freq: Once | INTRAVENOUS | Status: AC
  Administered 2021-01-27: 150 mg via INTRAVENOUS
  Filled 2021-01-27: qty 100

## 2021-01-27 MED ORDER — AMIODARONE LOAD VIA INFUSION
150.0000 mg | Freq: Once | INTRAVENOUS | Status: AC
  Administered 2021-01-27: 150 mg via INTRAVENOUS
  Filled 2021-01-27: qty 83.34

## 2021-01-27 MED ORDER — DOCUSATE SODIUM 100 MG PO CAPS: 100.0000 mg | ORAL_CAPSULE | Freq: Two times a day (BID) | ORAL | Status: DC | PRN

## 2021-01-27 MED ORDER — ORAL CARE MOUTH RINSE
15.0000 mL | Freq: Two times a day (BID) | OROMUCOSAL | Status: DC
  Administered 2021-01-27 – 2021-01-28 (×2): 15 mL via OROMUCOSAL

## 2021-01-27 MED ORDER — PHENYLEPHRINE HCL-NACL 10-0.9 MG/250ML-% IV SOLN
0.0000 ug/min | INTRAVENOUS | Status: DC
Start: 2021-01-27 — End: 2021-01-28
  Filled 2021-01-27: qty 250

## 2021-01-27 MED ORDER — FLUMAZENIL 0.5 MG/5ML IV SOLN
INTRAVENOUS | Status: AC
  Administered 2021-01-27: 0.5 mg
  Filled 2021-01-27: qty 5

## 2021-01-27 MED ORDER — AMIODARONE HCL IN DEXTROSE 360-4.14 MG/200ML-% IV SOLN
30.0000 mg/h | INTRAVENOUS | Status: DC
  Administered 2021-01-28 (×2): 30 mg/h via INTRAVENOUS
  Filled 2021-01-27: qty 200

## 2021-01-27 MED ORDER — CHLORHEXIDINE GLUCONATE CLOTH 2 % EX PADS
6.0000 | MEDICATED_PAD | Freq: Every day | CUTANEOUS | Status: DC
  Administered 2021-01-27 – 2021-01-28 (×2): 6 via TOPICAL

## 2021-01-27 MED ORDER — FENTANYL CITRATE (PF) 100 MCG/2ML IJ SOLN
50.0000 ug | Freq: Once | INTRAMUSCULAR | Status: AC
  Administered 2021-01-27: 50 ug via INTRAVENOUS

## 2021-01-27 MED ORDER — ENOXAPARIN SODIUM 60 MG/0.6ML ~~LOC~~ SOLN: 52.5000 mg | SUBCUTANEOUS | Status: DC

## 2021-01-27 MED ORDER — LIDOCAINE IN D5W 4-5 MG/ML-% IV SOLN
1.0000 mg/min | INTRAVENOUS | Status: DC
  Filled 2021-01-27: qty 500

## 2021-01-27 MED ORDER — FENTANYL CITRATE (PF) 100 MCG/2ML IJ SOLN
INTRAMUSCULAR | Status: AC
  Filled 2021-01-27: qty 2

## 2021-01-27 MED ORDER — MIDAZOLAM HCL 2 MG/2ML IJ SOLN
INTRAMUSCULAR | Status: AC
  Filled 2021-01-27: qty 4

## 2021-01-27 MED ORDER — MUPIROCIN 2 % EX OINT
1.0000 "application " | TOPICAL_OINTMENT | Freq: Two times a day (BID) | CUTANEOUS | Status: DC
  Administered 2021-01-28 (×2): 1 via NASAL
  Filled 2021-01-27: qty 22

## 2021-01-27 MED ORDER — AMIODARONE HCL IN DEXTROSE 360-4.14 MG/200ML-% IV SOLN
60.0000 mg/h | INTRAVENOUS | Status: AC
  Administered 2021-01-27 (×2): 60 mg/h via INTRAVENOUS
  Filled 2021-01-27 (×3): qty 200

## 2021-01-27 MED ORDER — MIDAZOLAM HCL 2 MG/2ML IJ SOLN
4.0000 mg | Freq: Once | INTRAMUSCULAR | Status: AC
  Administered 2021-01-27: 4 mg via INTRAVENOUS

## 2021-01-27 MED ORDER — HEPARIN (PORCINE) 25000 UT/250ML-% IV SOLN
1350.0000 [IU]/h | INTRAVENOUS | Status: DC
  Administered 2021-01-27: 1350 [IU]/h via INTRAVENOUS
  Filled 2021-01-27: qty 250

## 2021-01-27 MED ORDER — FLUMAZENIL 0.5 MG/5ML IV SOLN
0.5000 mg | Freq: Once | INTRAVENOUS | Status: AC
  Administered 2021-01-27: 0.5 mg via INTRAVENOUS

## 2021-01-27 MED ORDER — POLYETHYLENE GLYCOL 3350 17 G PO PACK: 17.0000 g | PACK | Freq: Every day | ORAL | Status: DC | PRN

## 2021-01-27 MED ORDER — LIDOCAINE HCL (CARDIAC) PF 100 MG/5ML IV SOSY
1.0000 mg/kg | PREFILLED_SYRINGE | Freq: Once | INTRAVENOUS | Status: DC
  Filled 2021-01-27: qty 10

## 2021-01-27 MED ORDER — HEPARIN BOLUS VIA INFUSION
4000.0000 [IU] | Freq: Once | INTRAVENOUS | Status: AC
  Administered 2021-01-27: 4000 [IU] via INTRAVENOUS
  Filled 2021-01-27: qty 4000

## 2021-01-27 NOTE — H&P (Signed)
NAME:  Garrett Hunter, MRN:  [id number], DOB:  [birth year], LOS: 0 ADMISSION DATE:  01/27/2021, CONSULTATION DATE: 01/27/2021 REFERRING MD: Dr. Corky Downs, CHIEF COMPLAINT: Chest pain  History of Present Illness:  75 year old male with significant cardiac history including 3 past MI's and AAA presenting to the ED from home via EMS with an episode of chest pain.  He described completing yard work, and when putting his tools away felt dizzy with mild chest discomfort and tingling down his left arm into his left pinky and ring finger.  He took 4 aspirin sat down and felt better with the chest pain resolving but continued to have diaphoresis and lightheadedness.  When EMS arrived they found the patient in a wide complex tachycardia.  Per EMS report and ED documentation the wide-complex tachycardia was refractory to 2 doses of adenosine and an amnio bolus in the field. ED course: EKG consistent with ventricular tachycardia Dr. Corky Downs discussed the case with Dr. Rayann Heman of [facility] cardiology who recommended an additional bolus of amiodarone followed by an amiodarone infusion. Initial vitals: Tachycardic at 169, soft BP 104/92 with a MAP of 98 (patient's normal blood pressure in the 140's/70s), RR 19, afebrile at 98.4 & SPO2 stable at 97% on room air. Significant labs: Mild AKI with creatinine 1.39-otherwise chemistry panel stable, troponin mildly elevated at 32 & mild leukocytosis at 11.4.  PCCM consulted as the patient remains in a wide-complex tachycardia after an hour on amiodarone drip following his second bolus. Pertinent  Medical History  CAD s/p Inferior MI in 1996 treated with TPA. Cardiac cath in 2011 at [facility] showed chronic occlusion of the proximal RCA and proximal left circumflex without obstructive disease in the LAD. Non-ST elevation myocardial infarction in October 2013 while on vacation in [place]. Cardiac catheterization showed plaque rupture in the proximal ramus with thrombus. He had balloon  angioplasty done.   Significant Hospital Events: Including procedures, antibiotic start and stop dates in addition to other pertinent events   . 4/25 admitted to ICU for V refractory V tach    Objective   Blood pressure (!) 139/118, pulse (!) 150, temperature 97.7 F (36.5 C), temperature source Oral, resp. rate (!) 27, height 5\' 11"  (1.803 m), weight 106.1 kg, SpO2 96 %.       No intake or output data in the 24 hours ending 01/27/21 2030 Filed Weights   01/27/21 1751  Weight: 106.1 kg    Review of Systems:  Gen:  Denies  fever, sweats, chills weight loss  HEENT: Denies blurred vision, double vision, ear pain, eye pain, hearing loss, nose bleeds, sore throat Cardiac:  + dizziness, +chest pain+ heaviness, +chest tightness, Resp:   No cough, -sputum production, +shortness of breath,-wheezing, -hemoptysis,  Gi: Denies swallowing difficulty, stomach pain, nausea or vomiting, diarrhea, constipation, bowel incontinence Gu:  Denies bladder incontinence, burning urine Ext:   Denies Joint pain, stiffness or swelling Skin: Denies  skin rash, easy bruising or bleeding or hives Endoc:  Denies polyuria, polydipsia , polyphagia or weight change Psych:   Denies depression, insomnia or hallucinations  Other:  All other systems negative    Labs/imaging that I havepersonally reviewed  (right click and "Reselect all SmartList Selections" daily)     Assessment & Plan:  75 yo obese white male with acute V tach unstable arrhythmia with ischemic cardiomyopathy with renal failure patient with previous h/o  MI and CAD  ACUTE CARDIAC FAILURE- Unstable V tach -oxygen as needed -Lasix as tolerated -follow up  cardiac enzymes as indicated -follow up cardiology recs Plan for emergent cardioversion   ACUTE KIDNEY INJURY/Renal Failure -continue Foley Catheter-assess need -Avoid nephrotoxic agents -Follow urine output, BMP -Ensure adequate renal perfusion, optimize oxygenation -Renal dose  medications   ELECTROLYTES -follow labs as needed -replace as needed -pharmacy consultation and following      Best practice (right click and "Reselect all SmartList Selections" daily)  Diet:  NPO Pain/Anxiety/Delirium protocol (if indicated): No VAP protocol (if indicated): Not indicated DVT prophylaxis: Systemic AC GI prophylaxis: N/A Glucose control:  SSI Yes Central venous access:  N/A Arterial line:  N/A Foley:  N/A Mobility:  bed rest  Code Status:  FULL CODE Disposition: ICU Labs   CBC: Recent Labs  Lab 01/27/21 1754  WBC 11.4*  HGB 14.8  HCT 44.2  MCV 98.4  PLT 010    Basic Metabolic Panel: Recent Labs  Lab 01/27/21 1754  NA 138  K 4.3  CL 101  CO2 25  GLUCOSE 173*  BUN 23  CREATININE 1.39*  CALCIUM 9.9  MG 2.2   GFR: Estimated Creatinine Clearance: 59.5 mL/min (A) (by C-G formula based on SCr of 1.39 mg/dL (H)). Recent Labs  Lab 01/27/21 1754  WBC 11.4*    Liver Function Tests: Recent Labs  Lab 01/27/21 1754  AST 32  ALT 32  ALKPHOS 76  BILITOT 0.7  PROT 7.1  ALBUMIN 4.5   No results for input(s): LIPASE, AMYLASE in the last 168 hours. No results for input(s): AMMONIA in the last 168 hours.  ABG No results found for: PHART, PCO2ART, PO2ART, HCO3, TCO2, ACIDBASEDEF, O2SAT   Coagulation Profile: Recent Labs  Lab 01/27/21 1754  INR 1.0    Cardiac Enzymes: No results for input(s): CKTOTAL, CKMB, CKMBINDEX, TROPONINI in the last 168 hours.  HbA1C: No results found for: HGBA1C  CBG: Recent Labs  Lab 01/27/21 2027  GLUCAP 253*      Past Medical History:  He,  has a past medical history of AAA (abdominal aortic aneurysm) ([place]), Benign neoplasm of colon, CHF (congestive heart failure) ([place]), Chronic airway obstruction, not elsewhere classified, Coronary artery disease, Coronary atherosclerosis of native coronary artery, Hepatitis A, Hyperlipidemia, Hypertension, and MI (myocardial infarction) ([place]).   Surgical  History:   Past Surgical History:  Procedure Laterality Date  . ABDOMINAL AORTIC ANEURYSM REPAIR     11/16/2016  . ANGIOPLASTY    . CARDIAC CATHETERIZATION  2011  . CARDIAC CATHETERIZATION  2/14   [facility]; 95% lesion in the ramus intermedius.   [name] CATARACT EXTRACTION W/PHACO Left 12/19/2018   Procedure: CATARACT EXTRACTION PHACO AND INTRAOCULAR LENS PLACEMENT ([place])  LEFT;  Surgeon: Eulogio Bear, MD;  Location: [place];  Service: Ophthalmology;  Laterality: Left;  . CATARACT EXTRACTION W/PHACO Right 03/06/2019   Procedure: CATARACT EXTRACTION PHACO AND INTRAOCULAR LENS PLACEMENT (IOC)RIGHT;  Surgeon: Eulogio Bear, MD;  Location: [place];  Service: Ophthalmology;  Laterality: Right;  . CORONARY ANGIOPLASTY  1996   [facility] x1  . CORONARY ANGIOPLASTY  2/14   Severe restenosis in the ostial ramus. Status post angioplasty and drug-eluting stent placement with a 2.5 x 18 mm Xience drug-eluting stent  . ESOPHAGOGASTRODUODENOSCOPY N/A 08/03/2018   Procedure: ESOPHAGOGASTRODUODENOSCOPY (EGD);  Surgeon: Lin Landsman, MD;  Location: [facility] ENDOSCOPY;  Service: Gastroenterology;  Laterality: N/A;  . ESOPHAGOGASTRODUODENOSCOPY (EGD) WITH PROPOFOL N/A 11/03/2018   Procedure: ESOPHAGOGASTRODUODENOSCOPY (EGD) WITH PROPOFOL;  Surgeon: Lin Landsman, MD;  Location: [facility] ENDOSCOPY;  Service: Gastroenterology;  Laterality: N/A;  . ESOPHAGOGASTRODUODENOSCOPY (EGD) WITH PROPOFOL N/A 12/06/2018   Procedure: ESOPHAGOGASTRODUODENOSCOPY (EGD) WITH PROPOFOL;  Surgeon: Lucilla Lame, MD;  Location: [facility] ENDOSCOPY;  Service: Endoscopy;  Laterality: N/A;  . TONSILLECTOMY AND ADENOIDECTOMY       Social History:   reports that he quit smoking about 18 years ago. His smoking use included cigarettes. He has a 80.00 pack-year smoking history. He has quit using smokeless tobacco.  His smokeless tobacco use included chew. He reports current alcohol use of about 3.0 standard drinks of alcohol  per week. He reports that he does not use drugs.   Family History:  His family history includes Cancer (age of onset: 78) in his mother; Heart attack in his father; Hypertension in his brother and sister.   Allergies Allergies  Allergen Reactions  . Crestor [Rosuvastatin]   . Livalo [Pitavastatin]     myalgia  . Pravachol [Pravastatin Sodium]   . Zocor [Simvastatin]      Home Medications  Prior to Admission medications   Medication Sig Start Date End Date Taking? Authorizing Provider  acetaminophen (TYLENOL) 500 MG tablet Take 500 mg by mouth every 6 (six) hours as needed for mild pain or moderate pain.   Yes [provider]  Ascorbic Acid (VITAMIN C) 100 MG tablet Take 100 mg by mouth daily.   Yes [provider]  aspirin EC 81 MG tablet Take 81 mg by mouth daily.   Yes [provider]  carvedilol (COREG) 12.5 MG tablet TAKE 1 TABLET TWICE A DAY Patient taking differently: Take 12.5 mg by mouth 2 (two) times daily with a meal. 08/01/20  Yes Wellington Hampshire, MD  Coenzyme Q10 (COQ-10) 100 MG CAPS Take 100 mg by mouth daily.    Yes [provider]  lisinopril-hydrochlorothiazide (ZESTORETIC) 20-12.5 MG tablet Take 2 tablets by mouth daily. 06/17/20  Yes Wellington Hampshire, MD  Multiple Vitamin (MULTI-VITAMIN) tablet Take 1 tablet by mouth daily.   Yes [provider]  rosuvastatin (CRESTOR) 5 MG tablet TAKE 1 TABLET DAILY Patient taking differently: Take 5 mg by mouth daily. 08/01/20  Yes Wellington Hampshire, MD  zinc gluconate 50 MG tablet Take 50 mg by mouth daily.   Yes [provider]  Cholecalciferol (VITAMIN D3) 1.25 MG (50000 UT) CAPS Take 1.25 mg by mouth daily. Patient not taking: No sig reported    [provider]  Javier Docker Oil 1000 MG CAPS Take 1,000 mg by mouth daily.  Patient not taking: No sig reported    [provider]  nitroGLYCERIN (NITROSTAT) 0.4 MG SL tablet Place 0.4 mg under the tongue every 5  (five) minutes as needed for chest pain. Patient not taking: No sig reported    [provider]      DVT/GI PRX  assessed I Assessed the need for Labs I Assessed the need for Foley I Assessed the need for Central Venous Line Family Discussion when available I Assessed the need for Mobilization I made an Assessment of medications to be adjusted accordingly Safety Risk assessment completed  CASE DISCUSSED IN MULTIDISCIPLINARY ROUNDS WITH ICU TEAM     Critical Care Time devoted to patient care services described in this note is 65  minutes.  Critical care was necessary to treat /prevent imminent and life-threatening deterioration. Overall, patient is critically ill, prognosis is guarded.    Corrin Parker, M.D.  [facility]  Medical Director [place] Director [facility] Cardio-Pulmonary Department

## 2021-01-27 NOTE — Consult Note (Signed)
PHARMACY CONSULT NOTE - FOLLOW UP  Pharmacy Consult for Electrolyte Monitoring and Replacement   Recent Labs: Potassium (mmol/L)  Date Value  01/27/2021 4.3  11/25/2012 4.6   Magnesium (mg/dL)  Date Value  01/27/2021 2.2   Calcium (mg/dL)  Date Value  01/27/2021 9.9   Calcium, Total (mg/dL)  Date Value  11/25/2012 9.1   Albumin (g/dL)  Date Value  01/27/2021 4.5  09/13/2012 4.5   Sodium (mmol/L)  Date Value  01/27/2021 138  11/25/2012 141     Assessment: 75 y.o. male who presents with complaints of dizziness and chest discomfort. Pharmacy has been consulted to monitor and replace electrolytes as needed.  Goal of Therapy:  K: 4-5.1 Mag: 2-2.4  Plan:   No additional replacement warranted at this time  Follow up with AM labs   Dorothe Pea ,PharmD, BCPS 01/27/2021 9:08 PM

## 2021-01-27 NOTE — Progress Notes (Signed)
eLink Physician-Brief Progress Note Patient Name: Garrett Hunter DOB: 1946/07/03 MRN: [id number]   Date of Service  01/27/2021  HPI/Events of Note  Patient transported to ED secondary to chest pain and light headedness, EKG shows wide complex regular tachycardia with rate of 150, BP 98/79 currently. Patient has received two Amiodarone boluses and is currently on Amiodarone gtt at 1 mg  / minute, he denies chest pain or light headedness currently, states he just feels tired.  eICU Interventions  I spoke with NP Domingo Pulse and advised that she get in touch with attending cardiologist right away regarding definitive plan for terminating the rhythm given that Amiodarone bolus x 2  followed by Amiodarone infusion had not terminated the rhythm, considerations include adding another anti-arrhythmic, Leotis Pain informed me that the cardiologist had mentioned possible cardioversion also. Domingo Pulse is to update me following conversation with the cardiologist. New Patient Evaluation done.        Kerry Kass Annaly Skop 01/27/2021, 9:00 PM

## 2021-01-27 NOTE — ED Triage Notes (Signed)
Pt to ED [facility] from home for cp. EMS reports pt was vtach with rate of 170s. Adenosine 6 mg given and 12 mg twice. 150mg  amio drip given PTA Pt denies cp at this time   Pt continues to be in vtach on arrival, pt alert and oriented

## 2021-01-27 NOTE — Significant Event (Incomplete)
cardioversion

## 2021-01-27 NOTE — Progress Notes (Signed)
PHARMACIST - PHYSICIAN COMMUNICATION  CONCERNING:  Enoxaparin (Lovenox) for DVT Prophylaxis    RECOMMENDATION: Patient was prescribed enoxaprin 40mg  q24 hours for VTE prophylaxis.   Filed Weights   01/27/21 1751  Weight: 106.1 kg (234 lb)    Body mass index is 32.64 kg/m.  Estimated Creatinine Clearance: 59.5 mL/min (A) (by C-G formula based on SCr of 1.39 mg/dL (H)).   Based on [place] patient is candidate for enoxaparin 0.5mg /kg TBW SQ every 24 hours based on BMI being >30.  DESCRIPTION: Pharmacy has adjusted enoxaparin dose per [facility] policy.  Patient is now receiving enoxaparin 52.5 mg every q24 hours    Tobey Lippard, PharmD Clinical Pharmacist  01/27/2021 7:47 PM

## 2021-01-27 NOTE — ED Notes (Signed)
Dr Corky Downs at bedside. Pt on pads

## 2021-01-27 NOTE — CV Procedure (Signed)
Cardioversion note: The patient is well-known to me with history of coronary artery disease with previous inferior ST elevation myocardial infarction with known chronically occluded right coronary artery and small left circumflex with collaterals and previous drug-eluting stent placement to ostial ramus.  Most recent ejection fraction was normal.  He presented with palpitations, dizziness and mild chest discomfort and was noted to be in ventricular tachycardia.  The patient was started on amiodarone drip and boluses and did not respond.  He developed hypotension.  The case was discussed with Dr. Rayann Heman and we both felt that the best option is to proceed with urgent cardioversion.  This was discussed with the patient and he was agreeable.   The pads were placed in the anterior posterior fashion.  The patient was given 4 mg of Versed and 50 mcg of fentanyl.   Successful cardioversion was performed with a 120 J. The patient converted to sinus rhythm. Pre-and post EKGs were reviewed. The patient did develop respiratory suppression with sedation which resolved after reversing Versed with flumazenil.  The patient was alert and oriented and felt immediately better.  Recommendations: Continue IV amiodarone drip overnight and possibly switch to oral tomorrow. Continue to cycle troponin.  Start heparin drip given the possibility of acute coronary syndrome. Suspect that ventricular tachycardia is scar mediated from the inferior wall.  However, we will have to exclude an ischemic etiology.  Obtain an echocardiogram and most likely the patient will require cardiac catheterization tomorrow.

## 2021-01-27 NOTE — Progress Notes (Signed)
Date and time results received: 01/27/21 9:22 PM   Test: Troponin  Critical Value: 116  Name of Provider Notified: Domingo Pulse Rust-Chester, NP  Orders Received? Or Actions Taken?: No new orders at this time

## 2021-01-27 NOTE — ED Notes (Signed)
Informed [place], NP, of current blood pressure. Continue with 3rd amio bolus

## 2021-01-27 NOTE — ED Provider Notes (Signed)
[facility] Emergency Department Provider Note   ____________________________________________    I have reviewed the triage vital signs and the nursing notes.   HISTORY  Chief Complaint Chest Pain     HPI Garrett Hunter is a 75 y.o. male who presents with complaints of dizziness and chest discomfort.  Patient reports he was out working in the yard had been doing his lawn in the heat, he was putting away stools when he felt lightheaded.  He went inside and had mild chest discomfort with some tingling in his left arm.  He took aspirin and sat down and felt better.  He did call EMS because he continued to feel dizzy.  EMS arrived they found him to be in a wide-complex tachycardia, they did give adenosine x2 as well as an amnio bolus.  Here in the emergency department patient feels well, no chest pain, no palpitations no dizziness.  Past Medical History:  Diagnosis Date  . AAA (abdominal aortic aneurysm) ([place])    Small noted during cardiac catheterization in 2011.  [name] Benign neoplasm of colon   . CHF (congestive heart failure) ([place])   . Chronic airway obstruction, not elsewhere classified   . Coronary artery disease    Inferior MI in 1996 treated with TPA. Cardiac cath in 2011 at [facility] showed chronic occlusion of the proximal RCA and proximal left circumflex without obstructive disease in the LAD. Non-ST elevation myocardial infarction in October 2013 while on vacation in [place]. Cardiac catheterization showed plaque rupture in the proximal ramus with thrombus. He had balloon angioplasty done.   . Coronary atherosclerosis of native coronary artery   . Hepatitis A    teenager  . Hyperlipidemia    Intolerance to statins.  . Hypertension   . MI (myocardial infarction) ([place])    x 3 - last one 2013    Patient Active Problem List   Diagnosis Date Noted  . VT (ventricular tachycardia) ([place]) 01/27/2021  . Peptic ulcer disease   . Duodenal ulcer disease    . GIB (gastrointestinal bleeding) 08/02/2018  . Diabetes mellitus without complication ([place]) 26/94/8546  . Erectile dysfunction 07/01/2015  . Benign essential hypertension 08/09/2014  . Pure hypercholesterolemia 08/09/2014  . Chronic systolic heart failure ([place]) 08/04/2012  . Coronary artery disease   . Hyperlipidemia   . AAA (abdominal aortic aneurysm) ([place])   . NSTEMI (non-ST elevated myocardial infarction) ([place]) 07/09/2012  . Paroxysmal VT ([place]) 07/09/2012    Past Surgical History:  Procedure Laterality Date  . ABDOMINAL AORTIC ANEURYSM REPAIR     11/16/2016  . ANGIOPLASTY    . CARDIAC CATHETERIZATION  2011  . CARDIAC CATHETERIZATION  2/14   [facility]; 95% lesion in the ramus intermedius.   [name] CATARACT EXTRACTION W/PHACO Left 12/19/2018   Procedure: CATARACT EXTRACTION PHACO AND INTRAOCULAR LENS PLACEMENT ([place])  LEFT;  Surgeon: Eulogio Bear, MD;  Location: [place];  Service: Ophthalmology;  Laterality: Left;  . CATARACT EXTRACTION W/PHACO Right 03/06/2019   Procedure: CATARACT EXTRACTION PHACO AND INTRAOCULAR LENS PLACEMENT (IOC)RIGHT;  Surgeon: Eulogio Bear, MD;  Location: [place];  Service: Ophthalmology;  Laterality: Right;  . CORONARY ANGIOPLASTY  1996   [facility] x1  . CORONARY ANGIOPLASTY  2/14   Severe restenosis in the ostial ramus. Status post angioplasty and drug-eluting stent placement with a 2.5 x 18 mm Xience drug-eluting stent  . ESOPHAGOGASTRODUODENOSCOPY N/A 08/03/2018   Procedure: ESOPHAGOGASTRODUODENOSCOPY (EGD);  Surgeon: Lin Landsman, MD;  Location: [facility] ENDOSCOPY;  Service: Gastroenterology;  Laterality: N/A;  . ESOPHAGOGASTRODUODENOSCOPY (EGD) WITH PROPOFOL N/A 11/03/2018   Procedure: ESOPHAGOGASTRODUODENOSCOPY (EGD) WITH PROPOFOL;  Surgeon: Lin Landsman, MD;  Location: [facility] ENDOSCOPY;  Service: Gastroenterology;  Laterality: N/A;  . ESOPHAGOGASTRODUODENOSCOPY (EGD) WITH PROPOFOL N/A 12/06/2018   Procedure:  ESOPHAGOGASTRODUODENOSCOPY (EGD) WITH PROPOFOL;  Surgeon: Lucilla Lame, MD;  Location: [facility] ENDOSCOPY;  Service: Endoscopy;  Laterality: N/A;  . TONSILLECTOMY AND ADENOIDECTOMY      Prior to Admission medications   Medication Sig Start Date End Date Taking? Authorizing Provider  acetaminophen (TYLENOL) 500 MG tablet Take 500 mg by mouth every 6 (six) hours as needed for mild pain or moderate pain.   Yes [provider]  Ascorbic Acid (VITAMIN C) 100 MG tablet Take 100 mg by mouth daily.   Yes [provider]  aspirin EC 81 MG tablet Take 81 mg by mouth daily.   Yes [provider]  carvedilol (COREG) 12.5 MG tablet TAKE 1 TABLET TWICE A DAY Patient taking differently: Take 12.5 mg by mouth 2 (two) times daily with a meal. 08/01/20  Yes Wellington Hampshire, MD  Coenzyme Q10 (COQ-10) 100 MG CAPS Take 100 mg by mouth daily.    Yes [provider]  lisinopril-hydrochlorothiazide (ZESTORETIC) 20-12.5 MG tablet Take 2 tablets by mouth daily. 06/17/20  Yes Wellington Hampshire, MD  Multiple Vitamin (MULTI-VITAMIN) tablet Take 1 tablet by mouth daily.   Yes [provider]  rosuvastatin (CRESTOR) 5 MG tablet TAKE 1 TABLET DAILY Patient taking differently: Take 5 mg by mouth daily. 08/01/20  Yes Wellington Hampshire, MD  zinc gluconate 50 MG tablet Take 50 mg by mouth daily.   Yes [provider]  Cholecalciferol (VITAMIN D3) 1.25 MG (50000 UT) CAPS Take 1.25 mg by mouth daily. Patient not taking: No sig reported    [provider]  Javier Docker Oil 1000 MG CAPS Take 1,000 mg by mouth daily.  Patient not taking: No sig reported    [provider]  nitroGLYCERIN (NITROSTAT) 0.4 MG SL tablet Place 0.4 mg under the tongue every 5 (five) minutes as needed for chest pain. Patient not taking: No sig reported    [provider]     Allergies Crestor [rosuvastatin], Livalo [pitavastatin], Pravachol [pravastatin sodium], and Zocor  [simvastatin]  Family History  Problem Relation Age of Onset  . Cancer Mother 30       stomach  . Heart attack Father   . Hypertension Sister   . Hypertension Brother     Social History Social History   Tobacco Use  . Smoking status: Former Smoker    Packs/day: 2.00    Years: 40.00    Pack years: 80.00    Types: Cigarettes    Quit date: 2004    Years since quitting: 18.3  . Smokeless tobacco: Former Systems developer    Types: Secondary school teacher  . Vaping Use: Never used  Substance Use Topics  . Alcohol use: Yes    Alcohol/week: 3.0 standard drinks    Types: 3 Cans of beer per week    Comment: occassionally  . Drug use: No    Review of Systems  Constitutional: No fever/chills Eyes: No visual changes.  ENT: No sore throat. Cardiovascular: As above Respiratory: Denies shortness of breath. Gastrointestinal: No abdominal pain.  No nausea, no vomiting.   Genitourinary: Negative for dysuria. Musculoskeletal: Negative for back pain. Skin: Negative for rash. Neurological: Negative for headaches or weakness  ____________________________________________   PHYSICAL EXAM:  VITAL SIGNS: ED Triage Vitals  Enc Vitals Group     BP 01/27/21 1758 (!) 104/92     Pulse Rate 01/27/21 1756 (!) 169     Resp 01/27/21 1756 19     Temp 01/27/21 1756 98.4 F (36.9 C)     Temp Source 01/27/21 1756 Oral     SpO2 01/27/21 1756 97 %     Weight 01/27/21 1751 106.1 kg (234 lb)     Height 01/27/21 1751 1.803 m (5\' 11" )     Head Circumference --      Peak Flow --      Pain Score 01/27/21 1751 0     Pain Loc --      Pain Edu? --      Excl. in [place]? --     Constitutional: Alert and oriented. Eyes: Conjunctivae are normal.   Nose: No congestion/rhinnorhea. Mouth/Throat: Mucous membranes are moist.   Neck:  Painless ROM Cardiovascular: Tachycardia, regular rhythm. Grossly normal heart sounds.  Good peripheral circulation. Respiratory: Normal respiratory effort.  No retractions. Lungs  CTAB. Gastrointestinal: Soft and nontender. No distention.  No CVA tenderness.  Musculoskeletal: No lower extremity tenderness nor edema.  Warm and well perfused Neurologic:  Normal speech and language. No gross focal neurologic deficits are appreciated.  Skin:  Skin is warm, dry and intact. No rash noted. Psychiatric: Mood and affect are normal. Speech and behavior are normal.  ____________________________________________   LABS (all labs ordered are listed, but only abnormal results are displayed)  Labs Reviewed  CBC - Abnormal; Notable for the following components:      Result Value   WBC 11.4 (*)    All other components within normal limits  COMPREHENSIVE METABOLIC PANEL - Abnormal; Notable for the following components:   Glucose, Bld 173 (*)    Creatinine, Ser 1.39 (*)    GFR, Estimated 54 (*)    All other components within normal limits  TROPONIN I (HIGH SENSITIVITY) - Abnormal; Notable for the following components:   Troponin I (High Sensitivity) 32 (*)    All other components within normal limits  RESP PANEL BY RT-PCR (FLU A&B, COVID) ARPGX2  PROTIME-INR  APTT  MAGNESIUM  CBC  BASIC METABOLIC PANEL  MAGNESIUM  PHOSPHORUS  TROPONIN I (HIGH SENSITIVITY)   ____________________________________________  EKG  ED ECG REPORT I, Lavonia Drafts, the attending physician, personally viewed and interpreted this ECG.  Date: 01/27/2021  Rhythm: Ventricular tachycardia QRS Axis: Abnormal Intervals abnormal ST/T Wave abnormalities: Abnormal Narrative Interpretation: Wide-complex tachycardia  ____________________________________________  RADIOLOGY  Chest x-ray unremarkable ____________________________________________   PROCEDURES  Procedure(s) performed: No  Procedures   Critical Care performed: yes  CRITICAL CARE Performed by: Lavonia Drafts   Total critical care time:30 minutes  Critical care time was exclusive of separately billable procedures and  treating other patients.  Critical care was necessary to treat or prevent imminent or life-threatening deterioration.  Critical care was time spent personally by me on the following activities: development of treatment plan with patient and/or surrogate as well as nursing, discussions with consultants, evaluation of patient's response to treatment, examination of patient, obtaining history from patient or surrogate, ordering and performing treatments and interventions, ordering and review of laboratory studies, ordering and review of radiographic studies, pulse oximetry and re-evaluation of patient's condition.  ____________________________________________   INITIAL IMPRESSION / ASSESSMENT AND PLAN / ED COURSE  Pertinent labs & imaging results that were available during my care of  the patient were reviewed by me and considered in my medical decision making (see chart for details).  Patient presents with chest pain dizziness as noted above.  Found to be in V. tach.  Here in the emergency department he is feeling improved, no chest pain, no palpitations, no dizziness while lying in stretcher.  EKG is consistent with ventricular tachycardia, discussed with Dr. Rayann Heman of cardiology who recommends additional bolus of amiodarone and amiodarone infusion  Patient's lab work notable for mildly elevated troponin.  Again no chest pain in the emergency department  I discussed with Dr. Mortimer Fries of the ICU for admission     ____________________________________________   FINAL CLINICAL IMPRESSION(S) / ED DIAGNOSES  Final diagnoses:  V-tach [facility])        Note:  This document was prepared using Dragon voice recognition software and may include unintentional dictation errors.   Lavonia Drafts, MD 01/27/21 [phone]

## 2021-01-27 NOTE — Progress Notes (Signed)
ANTICOAGULATION CONSULT NOTE - Initial Consult  Pharmacy Consult for Heparin Indication: chest pain/ACS  Allergies  Allergen Reactions  . Crestor [Rosuvastatin]   . Livalo [Pitavastatin]     myalgia  . Pravachol [Pravastatin Sodium]   . Zocor [Simvastatin]     Patient Measurements: Height: 5\' 11"  (180.3 cm) Weight: 101.7 kg (224 lb 3.3 oz) IBW/kg (Calculated) : 75.3 Heparin Dosing Weight: 96.4 kg   Vital Signs: Temp: 97.7 F (36.5 C) (04/25 2025) Temp Source: Oral (04/25 2025) BP: 93/68 (04/25 2100) Pulse Rate: 138 (04/25 2100)  Labs: Recent Labs    01/27/21 1754 01/27/21 2046  HGB 14.8  --   HCT 44.2  --   PLT 213  --   APTT 29  --   LABPROT 12.6  --   INR 1.0  --   CREATININE 1.39*  --   TROPONINIHS 32* 116*    Estimated Creatinine Clearance: 58.4 mL/min (A) (by C-G formula based on SCr of 1.39 mg/dL (H)).   Medical History: Past Medical History:  Diagnosis Date  . AAA (abdominal aortic aneurysm) ([place])    Small noted during cardiac catheterization in 2011.  [name] Benign neoplasm of colon   . CHF (congestive heart failure) ([place])   . Chronic airway obstruction, not elsewhere classified   . Coronary artery disease    Inferior MI in 1996 treated with TPA. Cardiac cath in 2011 at [facility] showed chronic occlusion of the proximal RCA and proximal left circumflex without obstructive disease in the LAD. Non-ST elevation myocardial infarction in October 2013 while on vacation in [place]. Cardiac catheterization showed plaque rupture in the proximal ramus with thrombus. He had balloon angioplasty done.   . Coronary atherosclerosis of native coronary artery   . Hepatitis A    teenager  . Hyperlipidemia    Intolerance to statins.  . Hypertension   . MI (myocardial infarction) ([place])    x 3 - last one 2013    Medications:  Medications Prior to Admission  Medication Sig Dispense Refill Last Dose  . acetaminophen (TYLENOL) 500 MG tablet Take 500 mg by mouth every 6 (six)  hours as needed for mild pain or moderate pain.   prn at prn  . Ascorbic Acid (VITAMIN C) 100 MG tablet Take 100 mg by mouth daily.   prn at prn  . aspirin EC 81 MG tablet Take 81 mg by mouth daily.   01/27/2021 at Unknown time  . carvedilol (COREG) 12.5 MG tablet TAKE 1 TABLET TWICE A DAY (Patient taking differently: Take 12.5 mg by mouth 2 (two) times daily with a meal.) 180 tablet 1 01/27/2021 at 0900  . Coenzyme Q10 (COQ-10) 100 MG CAPS Take 100 mg by mouth daily.    01/26/2021 at Unknown time  . lisinopril-hydrochlorothiazide (ZESTORETIC) 20-12.5 MG tablet Take 2 tablets by mouth daily. 10 tablet 0 01/27/2021 at 0900  . Multiple Vitamin (MULTI-VITAMIN) tablet Take 1 tablet by mouth daily.   01/27/2021 at Unknown time  . rosuvastatin (CRESTOR) 5 MG tablet TAKE 1 TABLET DAILY (Patient taking differently: Take 5 mg by mouth daily.) 90 tablet 1 01/26/2021 at Unknown time  . zinc gluconate 50 MG tablet Take 50 mg by mouth daily.   prn at prn  . Cholecalciferol (VITAMIN D3) 1.25 MG (50000 UT) CAPS Take 1.25 mg by mouth daily. (Patient not taking: No sig reported)   Completed Course at Unknown time  . Krill Oil 1000 MG CAPS Take 1,000 mg by mouth daily.  (Patient  not taking: No sig reported)   Not Taking at Unknown time  . nitroGLYCERIN (NITROSTAT) 0.4 MG SL tablet Place 0.4 mg under the tongue every 5 (five) minutes as needed for chest pain. (Patient not taking: No sig reported)   Not Taking at Unknown time    Assessment: Pharmacy consulted to dose heparin in this 75 year old male with ACS/NSTEMI.   No prior anticoag noted. CrCl = 58.4 ml/min  Goal of Therapy:  Heparin level 0.3-0.7 units/ml Monitor platelets by anticoagulation protocol: Yes   Plan:  Give 4000 units bolus x 1 Start heparin infusion at 1350 units/hr Check anti-Xa level in 8 hours and daily while on heparin Continue to monitor H&H and platelets  Toran Murch D 01/27/2021,10:20 PM

## 2021-01-27 NOTE — Progress Notes (Signed)
GOALS OF CARE DISCUSSION  The Clinical status was relayed to family in detail. Wife and patient Updated and notified of patients medical condition.  Explained to family course of therapy and the modalities  Severe unstable arrhythmia Plan of care established-plan for amio, pressors and emergent cardioversion  Patient with Progressive multiorgan failure with a very high probablity of a very minimal chance of meaningful recovery despite all aggressive and optimal medical therapy.  PATIENT REMAINS FULL CODE  Family understands the situation.  Family are satisfied with Plan of action and management. All questions answered  Additional CC time 35 mins   Garrett Hunter Patricia Pesa, M.D.  [facility]  Medical Director [place] Director [facility] Cardio-Pulmonary Department

## 2021-01-28 ENCOUNTER — Encounter (HOSPITAL_COMMUNITY): Payer: Self-pay | Admitting: Cardiovascular Disease

## 2021-01-28 ENCOUNTER — Encounter
Admission: EM | Disposition: A | Discharge status: Other Acute Inpatient Hospital | Payer: Self-pay | Source: Other Acute Inpatient Hospital | Attending: Internal Medicine

## 2021-01-28 ENCOUNTER — Inpatient Hospital Stay
Admit: 2021-01-28 | Discharge: 2021-01-28 | Disposition: A | Discharge status: Home / Self Care | Payer: Medicare Other | Attending: Pulmonary Disease | Admitting: Pulmonary Disease

## 2021-01-28 ENCOUNTER — Inpatient Hospital Stay (HOSPITAL_COMMUNITY)
Admission: AD | Admit: 2021-01-28 | Discharge: 2021-02-02 | DRG: 252 | Disposition: A | Discharge status: Home / Self Care | Payer: Medicare Other | Source: Other Acute Inpatient Hospital | Attending: Neurology | Admitting: Neurology

## 2021-01-28 DIAGNOSIS — E7800 Pure hypercholesterolemia, unspecified: Secondary | ICD-10-CM | POA: Diagnosis not present

## 2021-01-28 DIAGNOSIS — E782 Mixed hyperlipidemia: Secondary | ICD-10-CM | POA: Diagnosis not present

## 2021-01-28 DIAGNOSIS — I6522 Occlusion and stenosis of left carotid artery: Secondary | ICD-10-CM | POA: Diagnosis present

## 2021-01-28 DIAGNOSIS — I248 Other forms of acute ischemic heart disease: Secondary | ICD-10-CM

## 2021-01-28 DIAGNOSIS — I472 Ventricular tachycardia, unspecified: Secondary | ICD-10-CM

## 2021-01-28 DIAGNOSIS — I493 Ventricular premature depolarization: Secondary | ICD-10-CM | POA: Diagnosis present

## 2021-01-28 DIAGNOSIS — E785 Hyperlipidemia, unspecified: Secondary | ICD-10-CM | POA: Diagnosis present

## 2021-01-28 DIAGNOSIS — R29710 NIHSS score 10: Secondary | ICD-10-CM | POA: Diagnosis not present

## 2021-01-28 DIAGNOSIS — J449 Chronic obstructive pulmonary disease, unspecified: Secondary | ICD-10-CM | POA: Diagnosis present

## 2021-01-28 DIAGNOSIS — I2510 Atherosclerotic heart disease of native coronary artery without angina pectoris: Secondary | ICD-10-CM | POA: Diagnosis present

## 2021-01-28 DIAGNOSIS — I110 Hypertensive heart disease with heart failure: Secondary | ICD-10-CM | POA: Diagnosis present

## 2021-01-28 DIAGNOSIS — N179 Acute kidney failure, unspecified: Secondary | ICD-10-CM

## 2021-01-28 DIAGNOSIS — I714 Abdominal aortic aneurysm, without rupture: Secondary | ICD-10-CM

## 2021-01-28 DIAGNOSIS — I739 Peripheral vascular disease, unspecified: Secondary | ICD-10-CM | POA: Diagnosis not present

## 2021-01-28 DIAGNOSIS — I63512 Cerebral infarction due to unspecified occlusion or stenosis of left middle cerebral artery: Secondary | ICD-10-CM | POA: Diagnosis not present

## 2021-01-28 DIAGNOSIS — I63132 Cerebral infarction due to embolism of left carotid artery: Secondary | ICD-10-CM

## 2021-01-28 DIAGNOSIS — Z8679 Personal history of other diseases of the circulatory system: Secondary | ICD-10-CM

## 2021-01-28 DIAGNOSIS — I5042 Chronic combined systolic (congestive) and diastolic (congestive) heart failure: Secondary | ICD-10-CM | POA: Diagnosis present

## 2021-01-28 DIAGNOSIS — Z7982 Long term (current) use of aspirin: Secondary | ICD-10-CM | POA: Diagnosis not present

## 2021-01-28 DIAGNOSIS — Z9841 Cataract extraction status, right eye: Secondary | ICD-10-CM

## 2021-01-28 DIAGNOSIS — I6340 Cerebral infarction due to embolism of unspecified cerebral artery: Secondary | ICD-10-CM | POA: Insufficient documentation

## 2021-01-28 DIAGNOSIS — Z8249 Family history of ischemic heart disease and other diseases of the circulatory system: Secondary | ICD-10-CM

## 2021-01-28 DIAGNOSIS — R4701 Aphasia: Secondary | ICD-10-CM | POA: Diagnosis not present

## 2021-01-28 DIAGNOSIS — Z888 Allergy status to other drugs, medicaments and biological substances status: Secondary | ICD-10-CM | POA: Diagnosis not present

## 2021-01-28 DIAGNOSIS — I5022 Chronic systolic (congestive) heart failure: Secondary | ICD-10-CM | POA: Diagnosis present

## 2021-01-28 DIAGNOSIS — R911 Solitary pulmonary nodule: Secondary | ICD-10-CM | POA: Diagnosis not present

## 2021-01-28 DIAGNOSIS — Z8619 Personal history of other infectious and parasitic diseases: Secondary | ICD-10-CM | POA: Diagnosis not present

## 2021-01-28 DIAGNOSIS — Z9842 Cataract extraction status, left eye: Secondary | ICD-10-CM | POA: Diagnosis not present

## 2021-01-28 DIAGNOSIS — Z961 Presence of intraocular lens: Secondary | ICD-10-CM | POA: Diagnosis present

## 2021-01-28 DIAGNOSIS — R29810 Facial weakness: Secondary | ICD-10-CM | POA: Diagnosis not present

## 2021-01-28 DIAGNOSIS — Z86010 Personal history of colonic polyps: Secondary | ICD-10-CM

## 2021-01-28 DIAGNOSIS — I10 Essential (primary) hypertension: Secondary | ICD-10-CM | POA: Diagnosis not present

## 2021-01-28 DIAGNOSIS — Z9861 Coronary angioplasty status: Secondary | ICD-10-CM | POA: Diagnosis not present

## 2021-01-28 DIAGNOSIS — I6330 Cerebral infarction due to thrombosis of unspecified cerebral artery: Secondary | ICD-10-CM | POA: Insufficient documentation

## 2021-01-28 DIAGNOSIS — I252 Old myocardial infarction: Secondary | ICD-10-CM

## 2021-01-28 DIAGNOSIS — I255 Ischemic cardiomyopathy: Secondary | ICD-10-CM | POA: Diagnosis present

## 2021-01-28 DIAGNOSIS — I63412 Cerebral infarction due to embolism of left middle cerebral artery: Secondary | ICD-10-CM | POA: Diagnosis not present

## 2021-01-28 DIAGNOSIS — Z79899 Other long term (current) drug therapy: Secondary | ICD-10-CM | POA: Diagnosis not present

## 2021-01-28 DIAGNOSIS — Z87891 Personal history of nicotine dependence: Secondary | ICD-10-CM | POA: Diagnosis not present

## 2021-01-28 DIAGNOSIS — I4729 Other ventricular tachycardia: Secondary | ICD-10-CM

## 2021-01-28 HISTORY — PX: LEFT HEART CATH AND CORONARY ANGIOGRAPHY: CATH118249

## 2021-01-28 LAB — CBC
HCT: 41.7 % (ref 39.0–52.0)
HCT: 40.6 % (ref 39.0–52.0)
Hemoglobin: 14 g/dL (ref 13.0–17.0)
Hemoglobin: 13.7 g/dL (ref 13.0–17.0)
MCH: 32.9 pg (ref 26.0–34.0)
MCH: 33.1 pg (ref 26.0–34.0)
MCHC: 33.6 g/dL (ref 30.0–36.0)
MCHC: 33.7 g/dL (ref 30.0–36.0)
MCV: 98.1 fL (ref 80.0–100.0)
MCV: 98.1 fL (ref 80.0–100.0)
Platelets: 166 10*3/uL (ref 150–400)
Platelets: 172 10*3/uL (ref 150–400)
RBC: 4.25 MIL/uL (ref 4.22–5.81)
RBC: 4.14 MIL/uL — ABNORMAL LOW (ref 4.22–5.81)
RDW: 13.2 % (ref 11.5–15.5)
RDW: 13.1 % (ref 11.5–15.5)
WBC: 13.1 10*3/uL — ABNORMAL HIGH (ref 4.0–10.5)
WBC: 14 10*3/uL — ABNORMAL HIGH (ref 4.0–10.5)
nRBC: 0 % (ref 0.0–0.2)
nRBC: 0 % (ref 0.0–0.2)

## 2021-01-28 LAB — CREATININE, SERUM
Creatinine, Ser: 1.4 mg/dL — ABNORMAL HIGH (ref 0.61–1.24)
GFR, Estimated: 53 mL/min — ABNORMAL LOW (ref >60)

## 2021-01-28 LAB — LIPID PANEL
Cholesterol: 112 mg/dL (ref 0–200)
HDL: 40 mg/dL — ABNORMAL LOW (ref >40)
LDL Cholesterol: 61 mg/dL (ref 0–99)
Total CHOL/HDL Ratio: 2.8 RATIO
Triglycerides: 53 mg/dL (ref <150)
VLDL: 11 mg/dL (ref 0–40)

## 2021-01-28 LAB — MAGNESIUM: Magnesium: 2.2 mg/dL (ref 1.7–2.4)

## 2021-01-28 LAB — ECHOCARDIOGRAM COMPLETE
AR max vel: 3.45 cm2
AV Area VTI: 3.2 cm2
AV Area mean vel: 2.95 cm2
AV Mean grad: 1 mmHg
AV Peak grad: 2.1 mmHg
Ao pk vel: 0.73 m/s
Area-P 1/2: 5.79 cm2
Height: 71 in
S' Lateral: 3.22 cm
Weight: 3654.34 oz

## 2021-01-28 LAB — BASIC METABOLIC PANEL
Anion gap: 12 (ref 5–15)
BUN: 26 mg/dL — ABNORMAL HIGH (ref 8–23)
CO2: 26 mmol/L (ref 22–32)
Calcium: 9.3 mg/dL (ref 8.9–10.3)
Chloride: 100 mmol/L (ref 98–111)
Creatinine, Ser: 1.39 mg/dL — ABNORMAL HIGH (ref 0.61–1.24)
GFR, Estimated: 54 mL/min — ABNORMAL LOW (ref >60)
Glucose, Bld: 120 mg/dL — ABNORMAL HIGH (ref 70–99)
Potassium: 3.7 mmol/L (ref 3.5–5.1)
Sodium: 138 mmol/L (ref 135–145)

## 2021-01-28 LAB — TROPONIN I (HIGH SENSITIVITY)
Troponin I (High Sensitivity): 652 ng/L (ref <18)
Troponin I (High Sensitivity): 1272 ng/L (ref <18)
Troponin I (High Sensitivity): 1509 ng/L (ref <18)
Troponin I (High Sensitivity): 1844 ng/L (ref <18)
Troponin I (High Sensitivity): 2162 ng/L (ref <18)

## 2021-01-28 LAB — GLUCOSE, CAPILLARY: Glucose-Capillary: 82 mg/dL (ref 70–99)

## 2021-01-28 LAB — HEPARIN LEVEL (UNFRACTIONATED): Heparin Unfractionated: 0.42 IU/mL (ref 0.30–0.70)

## 2021-01-28 LAB — PHOSPHORUS: Phosphorus: 4.4 mg/dL (ref 2.5–4.6)

## 2021-01-28 SURGERY — LEFT HEART CATH AND CORONARY ANGIOGRAPHY
Anesthesia: Moderate Sedation

## 2021-01-28 MED ORDER — VERAPAMIL HCL 2.5 MG/ML IV SOLN
INTRAVENOUS | Status: DC | PRN
  Administered 2021-01-28: 2.5 mg via INTRAVENOUS

## 2021-01-28 MED ORDER — SODIUM CHLORIDE 0.9% FLUSH: 3.0000 mL | Freq: Two times a day (BID) | INTRAVENOUS | Status: DC

## 2021-01-28 MED ORDER — HEPARIN SODIUM (PORCINE) 5000 UNIT/ML IJ SOLN
5000.0000 [IU] | Freq: Three times a day (TID) | INTRAMUSCULAR | Status: DC
  Administered 2021-01-28 – 2021-01-29 (×4): 5000 [IU] via SUBCUTANEOUS
  Filled 2021-01-28 (×4): qty 1

## 2021-01-28 MED ORDER — HYDRALAZINE HCL 20 MG/ML IJ SOLN: 10.0000 mg | INTRAMUSCULAR | Status: DC | PRN

## 2021-01-28 MED ORDER — POTASSIUM CHLORIDE CRYS ER 20 MEQ PO TBCR
40.0000 meq | EXTENDED_RELEASE_TABLET | Freq: Once | ORAL | Status: AC
  Administered 2021-01-28: 40 meq via ORAL

## 2021-01-28 MED ORDER — SODIUM CHLORIDE 0.9 % IV SOLN: INTRAVENOUS | Status: DC

## 2021-01-28 MED ORDER — FENTANYL CITRATE (PF) 100 MCG/2ML IJ SOLN
INTRAMUSCULAR | Status: DC | PRN
  Administered 2021-01-28: 25 ug via INTRAVENOUS

## 2021-01-28 MED ORDER — HEPARIN SODIUM (PORCINE) 5000 UNIT/ML IJ SOLN: 5000.0000 [IU] | Freq: Three times a day (TID) | INTRAMUSCULAR | Status: DC

## 2021-01-28 MED ORDER — MIDAZOLAM HCL 2 MG/2ML IJ SOLN
INTRAMUSCULAR | Status: AC
  Filled 2021-01-28: qty 2

## 2021-01-28 MED ORDER — SODIUM CHLORIDE 0.9% FLUSH
3.0000 mL | Freq: Two times a day (BID) | INTRAVENOUS | Status: DC
  Administered 2021-01-28: 3 mL via INTRAVENOUS

## 2021-01-28 MED ORDER — ROSUVASTATIN CALCIUM 5 MG PO TABS
5.0000 mg | ORAL_TABLET | Freq: Every day | ORAL | Status: DC
  Administered 2021-01-29 – 2021-02-02 (×4): 5 mg via ORAL
  Filled 2021-01-28 (×4): qty 1

## 2021-01-28 MED ORDER — VERAPAMIL HCL 2.5 MG/ML IV SOLN
INTRAVENOUS | Status: AC
  Filled 2021-01-28: qty 2

## 2021-01-28 MED ORDER — FENTANYL CITRATE (PF) 100 MCG/2ML IJ SOLN
INTRAMUSCULAR | Status: AC
  Filled 2021-01-28: qty 2

## 2021-01-28 MED ORDER — HEPARIN (PORCINE) IN NACL 1000-0.9 UT/500ML-% IV SOLN
INTRAVENOUS | Status: AC
  Filled 2021-01-28: qty 1000

## 2021-01-28 MED ORDER — ASPIRIN 81 MG PO CHEW: 81.0000 mg | CHEWABLE_TABLET | ORAL | Status: AC

## 2021-01-28 MED ORDER — SODIUM CHLORIDE 0.9 % IV SOLN: 250.0000 mL | INTRAVENOUS | Status: DC | PRN

## 2021-01-28 MED ORDER — ASPIRIN 81 MG PO CHEW
CHEWABLE_TABLET | ORAL | Status: AC
  Administered 2021-01-28: 81 mg via ORAL
  Filled 2021-01-28: qty 1

## 2021-01-28 MED ORDER — ASPIRIN EC 81 MG PO TBEC
81.0000 mg | DELAYED_RELEASE_TABLET | Freq: Every day | ORAL | Status: DC
  Administered 2021-01-29: 81 mg via ORAL
  Filled 2021-01-28: qty 1

## 2021-01-28 MED ORDER — SODIUM CHLORIDE 0.9% FLUSH: 3.0000 mL | INTRAVENOUS | Status: DC | PRN

## 2021-01-28 MED ORDER — SODIUM CHLORIDE 0.9% FLUSH
3.0000 mL | Freq: Two times a day (BID) | INTRAVENOUS | Status: DC
  Administered 2021-01-28 – 2021-02-02 (×10): 3 mL via INTRAVENOUS

## 2021-01-28 MED ORDER — CARVEDILOL 3.125 MG PO TABS
3.1250 mg | ORAL_TABLET | Freq: Two times a day (BID) | ORAL | Status: DC
  Administered 2021-01-28 – 2021-01-29 (×2): 3.125 mg via ORAL
  Filled 2021-01-28 (×2): qty 1

## 2021-01-28 MED ORDER — FUROSEMIDE 10 MG/ML IJ SOLN
20.0000 mg | Freq: Once | INTRAMUSCULAR | Status: AC
  Administered 2021-01-28: 20 mg via INTRAVENOUS

## 2021-01-28 MED ORDER — DOCUSATE SODIUM 100 MG PO CAPS: 100.0000 mg | ORAL_CAPSULE | Freq: Two times a day (BID) | ORAL | Status: DC | PRN

## 2021-01-28 MED ORDER — ONDANSETRON HCL 4 MG/2ML IJ SOLN: 4.0000 mg | Freq: Four times a day (QID) | INTRAMUSCULAR | Status: DC | PRN

## 2021-01-28 MED ORDER — HYDRALAZINE HCL 20 MG/ML IJ SOLN
10.0000 mg | INTRAMUSCULAR | Status: DC | PRN
  Administered 2021-02-02: 10 mg via INTRAVENOUS
  Filled 2021-01-28: qty 1

## 2021-01-28 MED ORDER — LABETALOL HCL 5 MG/ML IV SOLN: 10.0000 mg | INTRAVENOUS | Status: DC | PRN

## 2021-01-28 MED ORDER — ACETAMINOPHEN 325 MG PO TABS: 650.0000 mg | ORAL_TABLET | ORAL | Status: DC | PRN

## 2021-01-28 MED ORDER — CARVEDILOL 3.125 MG PO TABS
3.1250 mg | ORAL_TABLET | Freq: Two times a day (BID) | ORAL | Status: DC
  Administered 2021-01-28: 3.125 mg via ORAL

## 2021-01-28 MED ORDER — MIDAZOLAM HCL 2 MG/2ML IJ SOLN
INTRAMUSCULAR | Status: DC | PRN
  Administered 2021-01-28: 1 mg via INTRAVENOUS

## 2021-01-28 MED ORDER — HYDROCHLOROTHIAZIDE 12.5 MG PO CAPS
12.5000 mg | ORAL_CAPSULE | Freq: Every day | ORAL | Status: DC
  Administered 2021-01-29 – 2021-02-02 (×4): 12.5 mg via ORAL
  Filled 2021-01-28 (×4): qty 1

## 2021-01-28 MED ORDER — HEPARIN (PORCINE) IN NACL 1000-0.9 UT/500ML-% IV SOLN
INTRAVENOUS | Status: DC | PRN
  Administered 2021-01-28: 1000 mL

## 2021-01-28 MED ORDER — ROSUVASTATIN CALCIUM 10 MG PO TABS
5.0000 mg | ORAL_TABLET | Freq: Every day | ORAL | Status: DC
  Administered 2021-01-28: 5 mg via ORAL
  Filled 2021-01-28: qty 1

## 2021-01-28 MED ORDER — LABETALOL HCL 5 MG/ML IV SOLN
10.0000 mg | INTRAVENOUS | Status: DC | PRN
  Administered 2021-02-01 (×3): 10 mg via INTRAVENOUS
  Filled 2021-01-28 (×3): qty 4

## 2021-01-28 MED ORDER — LISINOPRIL 20 MG PO TABS
20.0000 mg | ORAL_TABLET | Freq: Every day | ORAL | Status: DC
  Administered 2021-01-29 – 2021-02-02 (×4): 20 mg via ORAL
  Filled 2021-01-28 (×4): qty 1

## 2021-01-28 MED ORDER — AMIODARONE HCL IN DEXTROSE 360-4.14 MG/200ML-% IV SOLN
30.0000 mg/h | INTRAVENOUS | Status: AC
  Administered 2021-01-28 – 2021-01-29 (×2): 30 mg/h via INTRAVENOUS
  Filled 2021-01-28 (×2): qty 200

## 2021-01-28 MED ORDER — HEPARIN SODIUM (PORCINE) 1000 UNIT/ML IJ SOLN
INTRAMUSCULAR | Status: DC | PRN
  Administered 2021-01-28: 5000 [IU] via INTRAVENOUS

## 2021-01-28 MED ORDER — IOHEXOL 300 MG/ML  SOLN
INTRAMUSCULAR | Status: DC | PRN
  Administered 2021-01-28: 27 mL

## 2021-01-28 MED ORDER — HEPARIN SODIUM (PORCINE) 1000 UNIT/ML IJ SOLN
INTRAMUSCULAR | Status: AC
  Filled 2021-01-28: qty 1

## 2021-01-28 SURGICAL SUPPLY — 10 items
CATH 5F 110X4 TIG (CATHETERS) ×1 IMPLANT
DEVICE RAD TR BAND REGULAR (VASCULAR PRODUCTS) ×1 IMPLANT
DRAPE BRACHIAL (DRAPES) ×1 IMPLANT
GLIDESHEATH SLEND SS 6F .021 (SHEATH) ×1 IMPLANT
GUIDEWIRE INQWIRE 1.5J.035X260 (WIRE) IMPLANT
INQWIRE 1.5J .035X260CM (WIRE) ×2
PACK CARDIAC CATH (CUSTOM PROCEDURE TRAY) ×2 IMPLANT
PROTECTION STATION PRESSURIZED (MISCELLANEOUS) ×2
SET ATX SIMPLICITY (MISCELLANEOUS) ×1 IMPLANT
STATION PROTECTION PRESSURIZED (MISCELLANEOUS) IMPLANT

## 2021-01-28 NOTE — Consult Note (Signed)
Cardiology Consultation:   Patient ID: Garrett Hunter MRN: [id number]; DOB: 1946-04-13  Admit date: 01/27/2021 Date of Consult: 01/28/2021  PCP:  Derinda Late, MD   [place]  Cardiologist:  Kathlyn Sacramento, MD   Patient Profile:   Garrett Hunter is a 75 y.o. male with a hx of coronary artery disease with multiple MI's, CTO's of RCA and LCx, and DES to ramus in 2014, AAA s/p endovascular repair at [facility], ischemic cardiomyopathy, hypertension, and hyperlipidemia who is being seen today for the evaluation of sustained ventricular tachycardia at the request of Dr. Mortimer Fries.  History of Present Illness:   Garrett Hunter was in his usual state of health until yesterday at ~2 PM.  He had just finished working in his yard and was taking his shoes off when he suddenly felt short of breath with associated chest discomfort and pounding in his chest.  He cannot characterize the chest pain further but notes that it was 3/10 in intensity and different than what he has experienced in the past with his MI's.  He also felt lightheaded but decided to take a shower before going to his wife's shop.  After getting out of the shower, his chest pain and palpitations had improved but Garrett Hunter felt profoundly fatigued and almost passed out.  He called 911 and was found to be in a wide complex tachycardia when EMS arrived.  He reportedly received adenosine and amiodarone without significant change in his rhythm.  He was seen in the ED by [name] and underwent cardioversion with restoration of sinus rhythm.  Procedure was complicated by transient respiratory suppression that was treated with flumazenil.  He has been maintained on amiodarone and heparin infusions.  Currently, Garrett Hunter reports feeling well.  His symptoms all resolved shortly after the cardioversion yesterday.  Up until his symptoms began yesterday, he had not experienced any recent chest pain, shortness of breath,  palpitations, lightheadedness, or edema.  He reports being compliant with his medications.   Past Medical History:  Diagnosis Date  . AAA (abdominal aortic aneurysm) ([place])    Small noted during cardiac catheterization in 2011.  [name] Benign neoplasm of colon   . CHF (congestive heart failure) ([place])   . Chronic airway obstruction, not elsewhere classified   . Coronary artery disease    Inferior MI in 1996 treated with TPA. Cardiac cath in 2011 at [facility] showed chronic occlusion of the proximal RCA and proximal left circumflex without obstructive disease in the LAD. Non-ST elevation myocardial infarction in October 2013 while on vacation in [place]. Cardiac catheterization showed plaque rupture in the proximal ramus with thrombus. He had balloon angioplasty done.   . Coronary atherosclerosis of native coronary artery   . Hepatitis A    teenager  . Hyperlipidemia    Intolerance to statins.  . Hypertension   . MI (myocardial infarction) ([place])    x 3 - last one 2013    Past Surgical History:  Procedure Laterality Date  . ABDOMINAL AORTIC ANEURYSM REPAIR     11/16/2016  . ANGIOPLASTY    . CARDIAC CATHETERIZATION  2011  . CARDIAC CATHETERIZATION  2/14   [facility]; 95% lesion in the ramus intermedius.   [name] CATARACT EXTRACTION W/PHACO Left 12/19/2018   Procedure: CATARACT EXTRACTION PHACO AND INTRAOCULAR LENS PLACEMENT ([place])  LEFT;  Surgeon: Eulogio Bear, MD;  Location: [place];  Service: Ophthalmology;  Laterality: Left;  . CATARACT EXTRACTION W/PHACO Right 03/06/2019  Procedure: CATARACT EXTRACTION PHACO AND INTRAOCULAR LENS PLACEMENT (IOC)RIGHT;  Surgeon: Eulogio Bear, MD;  Location: [place];  Service: Ophthalmology;  Laterality: Right;  . CORONARY ANGIOPLASTY  1996   [facility] x1  . CORONARY ANGIOPLASTY  2/14   Severe restenosis in the ostial ramus. Status post angioplasty and drug-eluting stent placement with a 2.5 x 18 mm Xience drug-eluting stent  .  ESOPHAGOGASTRODUODENOSCOPY N/A 08/03/2018   Procedure: ESOPHAGOGASTRODUODENOSCOPY (EGD);  Surgeon: Lin Landsman, MD;  [facility] ENDOSCOPY;  Service: Gastroenterology;  Laterality: N/A;  . ESOPHAGOGASTRODUODENOSCOPY (EGD) WITH PROPOFOL N/A 11/03/2018   Procedure: ESOPHAGOGASTRODUODENOSCOPY (EGD) WITH PROPOFOL;  Surgeon: Lin Landsman, MD;  Location: [facility] ENDOSCOPY;  Service: Gastroenterology;  Laterality: N/A;  . ESOPHAGOGASTRODUODENOSCOPY (EGD) WITH PROPOFOL N/A 12/06/2018   Procedure: ESOPHAGOGASTRODUODENOSCOPY (EGD) WITH PROPOFOL;  Surgeon: Lucilla Lame, MD;  Location: [facility] ENDOSCOPY;  Service: Endoscopy;  Laterality: N/A;  . TONSILLECTOMY AND ADENOIDECTOMY       Home Medications:  Prior to Admission medications   Medication Sig Start Date [name] Date Taking? Authorizing Provider  acetaminophen (TYLENOL) 500 MG tablet Take 500 mg by mouth every 6 (six) hours as needed for mild pain or moderate pain.   Yes [provider]  Ascorbic Acid (VITAMIN C) 100 MG tablet Take 100 mg by mouth daily.   Yes [provider]  aspirin EC 81 MG tablet Take 81 mg by mouth daily.   Yes [provider]  carvedilol (COREG) 12.5 MG tablet TAKE 1 TABLET TWICE A DAY Patient taking differently: Take 12.5 mg by mouth 2 (two) times daily with a meal. 08/01/20  Yes Wellington Hampshire, MD  Coenzyme Q10 (COQ-10) 100 MG CAPS Take 100 mg by mouth daily.    Yes [provider]  lisinopril-hydrochlorothiazide (ZESTORETIC) 20-12.5 MG tablet Take 2 tablets by mouth daily. 06/17/20  Yes Wellington Hampshire, MD  Multiple Vitamin (MULTI-VITAMIN) tablet Take 1 tablet by mouth daily.   Yes [provider]  rosuvastatin (CRESTOR) 5 MG tablet TAKE 1 TABLET DAILY Patient taking differently: Take 5 mg by mouth daily. 08/01/20  Yes Wellington Hampshire, MD  zinc gluconate 50 MG tablet Take 50 mg by mouth daily.   Yes [provider]  Cholecalciferol (VITAMIN D3) 1.25 MG (50000  UT) CAPS Take 1.25 mg by mouth daily. Patient not taking: No sig reported    [provider]  Javier Docker Oil 1000 MG CAPS Take 1,000 mg by mouth daily.  Patient not taking: No sig reported    [provider]  nitroGLYCERIN (NITROSTAT) 0.4 MG SL tablet Place 0.4 mg under the tongue every 5 (five) minutes as needed for chest pain. Patient not taking: No sig reported    [provider]    Inpatient Medications: Scheduled Meds: . Chlorhexidine Gluconate Cloth  6 each Topical Daily  . fentaNYL      . mouth rinse  15 mL Mouth Rinse BID  . midazolam      . mupirocin ointment  1 application Nasal BID  . potassium chloride  40 mEq Oral Once   Continuous Infusions: . amiodarone 30 mg/hr (01/28/21 0605)  . heparin 1,350 Units/hr (01/28/21 0605)   PRN Meds: docusate sodium, polyethylene glycol  Allergies:    Allergies  Allergen Reactions  . Crestor [Rosuvastatin]   . Livalo [Pitavastatin]     myalgia  . Pravachol [Pravastatin Sodium]   . Zocor [Simvastatin]     Social History:   Social History   Tobacco  Use  . Smoking status: Former Smoker    Packs/day: 2.00    Years: 40.00    Pack years: 80.00    Types: Cigarettes    Quit date: 2004    Years since quitting: 18.3  . Smokeless tobacco: Former Systems developer    Types: Secondary school teacher  . Vaping Use: Never used  Substance Use Topics  . Alcohol use: Yes    Alcohol/week: 3.0 standard drinks    Types: 3 Cans of beer per week    Comment: occassionally  . Drug use: No     Family History:   Family History  Problem Relation Age of Onset  . Cancer Mother 52       stomach  . Heart attack Father   . Hypertension Sister   . Hypertension Brother      ROS:  Please see the history of present illness. All other ROS reviewed and negative.     Physical Exam/Data:   Vitals:   01/28/21 0500 01/28/21 0600 01/28/21 0700 01/28/21 0730  BP: (!) 146/73 138/73 (!) 147/64   Pulse: (!) 59 63 60   Resp: 15 13 (!) 25    Temp:    98.2 F (36.8 C)  TempSrc:    Oral  SpO2: 99% 99% 98%   Weight: 103.6 kg     Height:        Intake/Output Summary (Last 24 hours) at 01/28/2021 0745 Last data filed at 01/28/2021 2841 Gross per 24 hour  Intake 878.37 ml  Output 375 ml  Net 503.37 ml   Last 3 Weights 01/28/2021 01/27/2021 01/27/2021  Weight (lbs) 228 lb 6.3 oz 224 lb 3.3 oz 234 lb  Weight (kg) 103.6 kg 101.7 kg 106.142 kg  Some encounter information is confidential and restricted. Go to Review Flowsheets activity to see all data.     Body mass index is 31.85 kg/m.  General:  Well nourished, well developed, in no acute distress HEENT: normal Lymph: no adenopathy Neck: no JVD Endocrine:  No thryomegaly Vascular: No carotid bruits; FA pulses 2+ bilaterally without bruits  Cardiac:  normal S1, S2; RRR; no murmurs, rubs, or gallops Lungs:  clear to auscultation bilaterally, no wheezing, rhonchi or rales  Abd: soft, nontender, no hepatomegaly  Ext: no edema Musculoskeletal:  No deformities, BUE and BLE strength normal and equal Skin: warm and dry  Neuro:  CNs 2-12 intact, no focal abnormalities noted Psych:  Normal affect   EKG:  The EKG performed earlier today was personally reviewed and demonstrates:  NSR with IVCD and nonspecific ST/T abnormalities.  Telemetry:  Telemetry was personally reviewed and demonstrates:  VT on presentation with cardioversion to NSR at 2215 yesterday.  Since, then he has been in NSR with PAC's and PVC's.  A single 9-beat run of NSVT occurred at 0249 this morning.  Relevant CV Studies: TTE (10/07/2016): - Left ventricle: The cavity size was normal. Systolic function was  normal. The estimated ejection fraction was in the range of 50%  to 55%. Hypokinesis of the basal inferior myocardium. Doppler  parameters are consistent with abnormal left ventricular  relaxation (grade 1 diastolic dysfunction).  - Aortic root: The aortic root was mildly dilated, 3.9 cm.  Ascending  aortia not well visualized. Aortic arch is normal size.  - Left atrium: The atrium was mildly dilated.  - Right ventricle: Systolic function was normal.  - Pulmonary arteries: Systolic pressure was within the normal  range.   Laboratory Data:  High Sensitivity  Troponin:   Recent Labs  Lab 01/27/21 2236 01/28/21 0031 01/28/21 0255 01/28/21 0431 01/28/21 0640  TROPONINIHS 218* 652* 1,272* 1,509* 1,844*     Chemistry Recent Labs  Lab 01/27/21 1754 01/28/21 0431  NA 138 138  K 4.3 3.7  CL 101 100  CO2 25 26  GLUCOSE 173* 120*  BUN 23 26*  CREATININE 1.39* 1.39*  CALCIUM 9.9 9.3  GFRNONAA 54* 54*  ANIONGAP 12 12    Recent Labs  Lab 01/27/21 1754  PROT 7.1  ALBUMIN 4.5  AST 32  ALT 32  ALKPHOS 76  BILITOT 0.7   Hematology Recent Labs  Lab 01/27/21 1754 01/28/21 0431  WBC 11.4* 13.1*  RBC 4.49 4.25  HGB 14.8 14.0  HCT 44.2 41.7  MCV 98.4 98.1  MCH 33.0 32.9  MCHC 33.5 33.6  RDW 13.0 13.2  PLT 213 166   BNPNo results for input(s): BNP, PROBNP in the last 168 hours.  DDimer No results for input(s): DDIMER in the last 168 hours.   Radiology/Studies:  DG Chest Port 1 View  Result Date: 01/27/2021 CLINICAL DATA:  Chest pain and tachycardia EXAM: PORTABLE CHEST 1 VIEW COMPARISON:  November 21, 2012. FINDINGS: The fibrillator leads overlie the left chest. The heart size and mediastinal contours are within normal limits. Both lungs are clear. The visualized skeletal structures are unremarkable. IMPRESSION: No active disease. Electronically Signed   By: Dahlia Bailiff MD   On: 01/27/2021 18:36     Assessment and Plan:   Ventricular tachycardia: Patient's presentation consistent with sustained ventricular tachycardia, as seen on presenting EKG's on arrival in the ED.  He required DCCV by [name] last night but has been maintaining NSR without further sustained ventricular arrhythmia.  I suspect this is most likely scar mediated, though worsening of his CAD  is certainly a possibility.  Continue IV amiodarone.  Restart carvedilol 3.125 mg BID with titration as heart rate allows.  Plan for LHC this AM to exclude worsening CAD.  EP consultation tomorrow to assist with antiarrhythmic management as well as consideration of ICD placement for secondary prevention.  Demand ischemia: I suspect elevated troponin is due to demand ischemia in the setting of known severe 2-vessel CAD, sustained VT, and DCCV last night.  Mr. Lizarraga is currently chest-pain free.  Continue IV heparin pending catheterization.  Continue ASA 81 mg daily.  Plan for LHC and possible PCI this AM.  Restart home rosuvastatin 5 mg daily.  Hypertension BP mildly elevated this AM.  Restart carvedilol 3.125 mg BID.  Hold lisinopril-HCTZ pending cath.  Hyperlipidemia:  Check FLP.  Restart rosuvastatin 5 mg daily.  Shared Decision Making/Informed Consent The risks [stroke (1 in 1000), death (1 in 1000), kidney failure [usually temporary] (1 in 500), bleeding (1 in 200), allergic reaction [possibly serious] (1 in 200)], benefits (diagnostic support and management of coronary artery disease) and alternatives of a cardiac catheterization were discussed in detail with Mr. Hausman and he is willing to proceed.  For questions or updates, please contact [place] Please consult [email or web address] for contact info under [facility].  Signed, Nelva Bush, MD  01/28/2021 7:45 AM

## 2021-01-28 NOTE — Progress Notes (Signed)
Patient transported to special recovery for pre-procedure prior to cath. Patient alert and oriented, on room air, heparin and amio infusing.

## 2021-01-28 NOTE — Consult Note (Signed)
PHARMACY CONSULT NOTE - FOLLOW UP  Pharmacy Consult for Electrolyte Monitoring and Replacement   Recent Labs: Potassium (mmol/L)  Date Value  01/28/2021 3.7  11/25/2012 4.6   Magnesium (mg/dL)  Date Value  01/28/2021 2.2   Calcium (mg/dL)  Date Value  01/28/2021 9.3   Calcium, Total (mg/dL)  Date Value  11/25/2012 9.1   Albumin (g/dL)  Date Value  01/27/2021 4.5  09/13/2012 4.5   Phosphorus (mg/dL)  Date Value  01/28/2021 4.4   Sodium (mmol/L)  Date Value  01/28/2021 138  11/25/2012 141     Assessment: 75 y.o. male with medical history significant for 3 past MI's and AAA who presents with complaints of dizziness and chest discomfort. Pharmacy has been consulted to monitor and replace electrolytes as needed. Pt is on an amiodarone drip.   Goal of Therapy:  K: 4-5.1 mmol/L Mg: 2-2.4 mmol/L All other electrolytes WNL  Plan:   K 3.7 - NP replaced with PO KCl 55mEq x1  Follow up with AM labs   Sherilyn Banker, PharmD Pharmacy Resident  01/28/2021 6:27 AM

## 2021-01-28 NOTE — Progress Notes (Signed)
*  PRELIMINARY RESULTS* Echocardiogram 2D Echocardiogram has been performed.  Sherrie Sport 01/28/2021, 9:09 AM

## 2021-01-28 NOTE — Discharge Summary (Signed)
Physician Discharge Summary  Patient ID: Garrett Hunter MRN: [id number] DOB/AGE: November 10, 1945 75 y.o.  Admit date: 01/27/2021 Discharge date: 01/28/2021  Admission Diagnoses: VT  Discharge Diagnoses:  Active Problems:   Coronary artery disease   AAA (abdominal aortic aneurysm) (H[facility])   Primary hypertension   Sustained VT (ventricular tachycardia) ([facility])   AKI (acute kidney injury) [facility])   Discharged Condition: good  Hospital Course:  75 year old male with significant cardiac history including 3 past MI's and AAA presenting to the ED from home via EMS with an episode of chest pain.  He described completing yard work, and when putting his tools away felt dizzy with mild chest discomfort and tingling down his left arm into his left pinky and ring finger.  He took 4 aspirin sat down and felt better with the chest pain resolving but continued to have diaphoresis and lightheadedness.  When EMS arrived they found the patient in a wide complex tachycardia.  Per EMS report and ED documentation the wide-complex tachycardia was refractory to 2 doses of adenosine and an amnio bolus in the field.  ED course: EKG consistent with ventricular tachycardia Dr. Corky Downs discussed the case with Dr. Rayann Heman of [facility] cardiology who recommended an additional bolus of amiodarone followed by an amiodarone infusion.  Initial vitals: Tachycardic at 169, soft BP 104/92 with a MAP of 98 (patient's normal blood pressure in the 140's/70s), RR 19, afebrile at 98.4 & SPO2 stable at 97% on room air.  Significant labs: Mild AKI with creatinine 1.39-otherwise chemistry panel stable, troponin mildly elevated at 32 & mild leukocytosis at 11.4.  PCCM consulted as the patient remains in a wide-complex tachycardia after an hour on amiodarone drip following his second bolus.  4/25 The patient required DCCV and was placed on amiodarone.   A LHC was performed today, and Dr. Saunders Revel consulted with his counterparts at [facility] to  inquire about AICD and further treatment pending an EP evaluation. The determination was made to transfer the patient for those purposes.  Consults: cardiology  Significant Diagnostic Studies 4/26LHC: Severe 2v CAD with CTO of pLCx and RCA. Minimal luminal irregularities in the LAD were without significant stenosis. Widely patent ramus intermedius stent was noted with moderately elevated LV filling pressure. LVEDP 25-52mmHg and moderately elevated.    4/26Echo: EF 45 to 50%, LV global hypokinesis, mild LVH, G2 DD.  Akinesis of the left ventricular, basal inferior segment noted.  Mild LAE and trivial MR noted.  Mild dilation of the aortic root measured 39 mm   Treatments:  4/25Cardioversion & Amiodarone  Discharge Exam: Blood pressure (!) 120/94, pulse 63, temperature 98.1 F (36.7 C), temperature source Oral, resp. rate (!) 32, height 5\' 11"  (1.803 m), weight 103.6 kg, SpO2 97 %.  Physical Exam Constitutional:      General: He is not in acute distress.    Appearance: He is obese.     Interventions: He is not intubated. HENT:     Head: Normocephalic.  Eyes:     Extraocular Movements: Extraocular movements intact.     Pupils: Pupils are equal, round, and reactive to light.  Cardiovascular:     Rate and Rhythm: Normal rate. Occasional extrasystoles are present.    Pulses: Normal pulses.     Heart sounds: Normal heart sounds.     Comments: Trace LE edema Pulmonary:     Effort: Pulmonary effort is normal. No accessory muscle usage. He is not intubated.     Breath sounds: Normal air entry.  Examination of the right-lower field reveals decreased breath sounds. Examination of the left-lower field reveals decreased breath sounds. Decreased breath sounds present.  Abdominal:     General: Bowel sounds are normal. There is no distension.     Palpations: Abdomen is soft.     Tenderness: There is no abdominal tenderness. There is no guarding or rebound.  Musculoskeletal:     Cervical back:  Normal range of motion.  Neurological:     Mental Status: He is alert and oriented to person, place, and time.     GCS: GCS eye subscore is 4. GCS verbal subscore is 5. GCS motor subscore is 6.     Disposition: Discharge disposition: [place] Not Defined        Allergies as of 01/28/2021      Reactions   Crestor [rosuvastatin]    Livalo [pitavastatin]    myalgia   Pravachol [pravastatin Sodium]    Zocor [simvastatin]       Medication List    STOP taking these medications   acetaminophen 500 MG tablet Commonly known as: TYLENOL   aspirin EC 81 MG tablet   carvedilol 12.5 MG tablet Commonly known as: COREG   CoQ-10 100 MG Caps   Krill Oil 1000 MG Caps   lisinopril-hydrochlorothiazide 20-12.5 MG tablet Commonly known as: ZESTORETIC   Multi-Vitamin tablet   nitroGLYCERIN 0.4 MG SL tablet Commonly known as: NITROSTAT   rosuvastatin 5 MG tablet Commonly known as: CRESTOR   vitamin C 100 MG tablet   Vitamin D3 1.25 MG (50000 UT) Caps   zinc gluconate 50 MG tablet      TRANSFER TO   [facility] checked in Epic, Dr. Johnsie Cancel accepting physician after conversation with Dr. Saunders Revel Patient to have AICD and EP Study performed at [facility], services not available at [facility] presently  Signed: Nelle Don 01/28/2021, 3:03 PM

## 2021-01-28 NOTE — Consult Note (Addendum)
Cardiology Consultation:   Patient ID: Garrett Hunter MRN: [id number]; DOB: 04-Sep-1946  Admit date: (Not on file) Date of Consult: 01/28/2021  PCP:  Derinda Late, MD   [place]  Cardiologist:  [name]    Patient Profile:   Garrett Hunter is a 75 y.o. male with a hx of CAD (dating back to 1996, last PCI was 2014), AAA s/p endovascular repair in 2018, HTN, HLD, ICM  who is being seen today for the evaluation of VT at the request of Dr. ENd.  History of Present Illness:   Garrett Hunter was in his USOH yesterday worked in the yard and when getting ready to shower and sudden onset of palpitations and some degree of chest discomfort unlike his anginal symptoms historically. He showered and ultimately became more symptomatic, weak, and called 911, found in WCT, given adenosine and amio with rhythm change. He was seen in the ER by [name] and cardioverted to SR, started on heparin gtt and amiodarone gtt  He had echo with LVEF 45-50%, grade II DD, and cath today with CTA of RCA and LCx, patent ramus stent and minimal luminal irregularities of the LAD, mod elevated LVEDP planned for gentle diuresis and transfer to [facility] for further management and EP evaluation.  LABS K+ 4.3 > 3.7 > 3.5 BUN/Creat 26/1.39 > 20/1.18 Mag 2.2 WBC 11.4 > 13.1 > 14 H/H 13/40 Plts 172  HS Trop 32, 116, 218, 652, 1272, 1509, 1844, 2162  resp panel negative  Patient has felt well since the ER without recurrent symptoms of any kind.  He reports symptoms and events as above. Never had any near syncope or syncope, but weak enough where he felt like he would not be OK to drive.   Past Medical History:  Diagnosis Date  . AAA (abdominal aortic aneurysm) ([place])    Small noted during cardiac catheterization in 2011.  [name] Benign neoplasm of colon   . CHF (congestive heart failure) ([place])   . Chronic airway obstruction, not elsewhere classified   . Coronary artery disease     Inferior MI in 1996 treated with TPA. Cardiac cath in [facility] at [facility] showed chronic occlusion of the proximal RCA and proximal left circumflex without obstructive disease in the LAD. Non-ST elevation myocardial infarction in October 2013 while on vacation in [place]. Cardiac catheterization showed plaque rupture in the proximal ramus with thrombus. He had balloon angioplasty done.   . Coronary atherosclerosis of native coronary artery   . Hepatitis A    teenager  . Hyperlipidemia    Intolerance to statins.  . Hypertension   . MI (myocardial infarction) ([place])    x 3 - last one 2013    Past Surgical History:  Procedure Laterality Date  . ABDOMINAL AORTIC ANEURYSM REPAIR     11/16/2016  . ANGIOPLASTY    . CARDIAC CATHETERIZATION  2011  . CARDIAC CATHETERIZATION  2/14   [facility]; 95% lesion in the ramus intermedius.   [name] CATARACT EXTRACTION W/PHACO Left 12/19/2018   Procedure: CATARACT EXTRACTION PHACO AND INTRAOCULAR LENS PLACEMENT ([place])  LEFT;  Surgeon: Eulogio Bear, MD;  Location: [place];  Service: Ophthalmology;  Laterality: Left;  . CATARACT EXTRACTION W/PHACO Right 03/06/2019   Procedure: CATARACT EXTRACTION PHACO AND INTRAOCULAR LENS PLACEMENT (IOC)RIGHT;  Surgeon: Eulogio Bear, MD;  Location: [place];  Service: Ophthalmology;  Laterality: Right;  . CORONARY ANGIOPLASTY  1996   [facility] x1  . CORONARY ANGIOPLASTY  2/14   Severe restenosis in the ostial ramus. Status post angioplasty and drug-eluting stent placement with a 2.5 x 18 mm Xience drug-eluting stent  . ESOPHAGOGASTRODUODENOSCOPY N/A 08/03/2018   Procedure: ESOPHAGOGASTRODUODENOSCOPY (EGD);  Surgeon: Lin Landsman, MD;  Location: [facility] ENDOSCOPY;  Service: Gastroenterology;  Laterality: N/A;  . ESOPHAGOGASTRODUODENOSCOPY (EGD) WITH PROPOFOL N/A 11/03/2018   Procedure: ESOPHAGOGASTRODUODENOSCOPY (EGD) WITH PROPOFOL;  Surgeon: Lin Landsman, MD;  Location: [facility] ENDOSCOPY;  Service:  Gastroenterology;  Laterality: N/A;  . ESOPHAGOGASTRODUODENOSCOPY (EGD) WITH PROPOFOL N/A 12/06/2018   Procedure: ESOPHAGOGASTRODUODENOSCOPY (EGD) WITH PROPOFOL;  Surgeon: Lucilla Lame, MD;  Location: [facility] ENDOSCOPY;  Service: Endoscopy;  Laterality: N/A;  . TONSILLECTOMY AND ADENOIDECTOMY       Home Medications:  Prior to Admission medications   Medication Sig Start Date End Date Taking? Authorizing Provider  acetaminophen (TYLENOL) 500 MG tablet Take 500 mg by mouth every 6 (six) hours as needed for mild pain or moderate pain.    [provider]  Ascorbic Acid (VITAMIN C) 100 MG tablet Take 100 mg by mouth daily.    [provider]  aspirin EC 81 MG tablet Take 81 mg by mouth daily.    [provider]  carvedilol (COREG) 12.5 MG tablet TAKE 1 TABLET TWICE A DAY Patient taking differently: Take 12.5 mg by mouth 2 (two) times daily with a meal. 08/01/20   Wellington Hampshire, MD  Cholecalciferol (VITAMIN D3) 1.25 MG (50000 UT) CAPS Take 1.25 mg by mouth daily. Patient not taking: No sig reported    [provider]  Coenzyme Q10 (COQ-10) 100 MG CAPS Take 100 mg by mouth daily.     [provider]  Javier Docker Oil 1000 MG CAPS Take 1,000 mg by mouth daily.  Patient not taking: No sig reported    [provider]  lisinopril-hydrochlorothiazide (ZESTORETIC) 20-12.5 MG tablet Take 2 tablets by mouth daily. 06/17/20   Wellington Hampshire, MD  Multiple Vitamin (MULTI-VITAMIN) tablet Take 1 tablet by mouth daily.    [provider]  nitroGLYCERIN (NITROSTAT) 0.4 MG SL tablet Place 0.4 mg under the tongue every 5 (five) minutes as needed for chest pain. Patient not taking: No sig reported    [provider]  rosuvastatin (CRESTOR) 5 MG tablet TAKE 1 TABLET DAILY Patient taking differently: Take 5 mg by mouth daily. 08/01/20   Wellington Hampshire, MD  zinc gluconate 50 MG tablet Take 50 mg by mouth daily.    [provider]     Inpatient Medications: Scheduled Meds:  Continuous Infusions:  PRN Meds:   Allergies:    Allergies  Allergen Reactions  . Crestor [Rosuvastatin]   . Livalo [Pitavastatin]     myalgia  . Pravachol [Pravastatin Sodium]   . Zocor [Simvastatin]     Social History:   Social History   Socioeconomic History  . Marital status: Married    Spouse name: Not on file  . Number of children: 4  . Years of education: Not on file  . Highest education level: Not on file  Occupational History  . Not on file  Tobacco Use  . Smoking status: Former Smoker    Packs/day: 2.00    Years: 40.00    Pack years: 80.00    Types: Cigarettes    Quit date: 2004    Years since quitting: 18.3  . Smokeless tobacco: Former Systems developer    Types: Secondary school teacher  . Vaping Use: Never used  Substance and  Sexual Activity  . Alcohol use: Yes    Alcohol/week: 3.0 standard drinks    Types: 3 Cans of beer per week    Comment: occassionally  . Drug use: No  . Sexual activity: Yes  Other Topics Concern  . Not on file  Social History Narrative   Lives at home with wife, works   Investment banker, operational of Radio broadcast assistant Strain: Not on file  Food Insecurity: Not on file  Transportation Needs: Not on file  Physical Activity: Not on file  Stress: Not on file  Social Connections: Not on file  Intimate Partner Violence: Not on file    Family History:   Family History  Problem Relation Age of Onset  . Cancer Mother 76       stomach  . Heart attack Father   . Hypertension Sister   . Hypertension Brother      ROS:  Please see the history of present illness.  All other ROS reviewed and negative.     Physical Exam/Data:  There were no vitals filed for this visit. No intake or output data in the 24 hours ending 01/28/21 1409 Last 3 Weights 01/28/2021 01/28/2021 01/27/2021  Weight (lbs) 228 lb 6.3 oz 228 lb 6.3 oz 224 lb 3.3 oz  Weight (kg) 103.6 kg 103.6 kg 101.7 kg  Some encounter  information is confidential and restricted. Go to Review Flowsheets activity to see all data.     There is no height or weight on file to calculate BMI.  General:  Well nourished, well developed, in no acute distress HEENT: normal Lymph: no adenopathy Neck: no JVD Endocrine:  No thryomegaly Vascular: No carotid bruits  Cardiac:  RRR; no murmurs, gallops or rubs Lungs:  CTA b/l, no wheezing, rhonchi or rales  Abd: soft, nontender  Ext: no edema Musculoskeletal:  No deformities Skin: warm and dry  Neuro:   No gross focal abnormalities noted Psych:  Normal affect   EKG:  The EKG was personally reviewed and demonstrates:    #1 WCT 169bpm, RBBB, LAD  #2 WCT 158bpm, unchanged morphology #3 SR 60,  IVCD  Old 07/16/2020: SR , LAD (Q II, III, aVF), IVCD  Telemetry:  Telemetry was personally reviewed and demonstrates:   SR, rare PVC only  Relevant CV Studies:   01/28/21: [facility] Conclusions: 1. 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  There are minimal luminal irregularities in the LAD without significant stenosis. 2. Widely patent ramus intermedius stent. 3. Moderately elevated left ventricular filling pressure.  Recommendations: 1. Continue IV amiodarone and ICU monitoring in the setting of sustained ventricular tachycardia that is likely scar mediated. 2. Gentle diuresis. 3. Aggressive secondary prevention of coronary artery disease. 4. EP consultation for further management of VT.    01/28/21: TTE IMPRESSIONS  1. Left ventricular ejection fraction, by estimation, is 45 to 50%. The  left ventricle has mildly decreased function. The left ventricle  demonstrates global hypokinesis. There is mild left ventricular  hypertrophy. Left ventricular diastolic parameters  are consistent with Grade II diastolic dysfunction (pseudonormalization).  There is akinesis of the left ventricular, basal inferior segment.  2. Right  ventricular systolic function is normal. The right ventricular  size is normal.  3. Left atrial size was mildly dilated.  4. The mitral valve is grossly normal. Trivial mitral valve  regurgitation. No evidence of mitral stenosis.  5. The aortic valve was not well visualized. Aortic valve  regurgitation  is not visualized. No aortic stenosis is present.  6. Aortic dilatation noted. There is mild dilatation of the aortic root,  measuring 39 mm.     Laboratory Data:  High Sensitivity Troponin:   Recent Labs  Lab 01/28/21 0031 01/28/21 0255 01/28/21 0431 01/28/21 0640 01/28/21 0921  TROPONINIHS 652* 1,272* 1,509* 1,844* 2,162*     Chemistry Recent Labs  Lab 01/27/21 1754 01/28/21 0431  NA 138 138  K 4.3 3.7  CL 101 100  CO2 25 26  GLUCOSE 173* 120*  BUN 23 26*  CREATININE 1.39* 1.39*  CALCIUM 9.9 9.3  GFRNONAA 54* 54*  ANIONGAP 12 12    Recent Labs  Lab 01/27/21 1754  PROT 7.1  ALBUMIN 4.5  AST 32  ALT 32  ALKPHOS 76  BILITOT 0.7   Hematology Recent Labs  Lab 01/27/21 1754 01/28/21 0431  WBC 11.4* 13.1*  RBC 4.49 4.25  HGB 14.8 14.0  HCT 44.2 41.7  MCV 98.4 98.1  MCH 33.0 32.9  MCHC 33.5 33.6  RDW 13.0 13.2  PLT 213 166   BNPNo results for input(s): BNP, PROBNP in the last 168 hours.  DDimer No results for input(s): DDIMER in the last 168 hours.   Radiology/Studies:   DG Chest Port 1 View Result Date: 01/27/2021 CLINICAL DATA:  Chest pain and tachycardia EXAM: PORTABLE CHEST 1 VIEW COMPARISON:  November 21, 2012. FINDINGS: The fibrillator leads overlie the left chest. The heart size and mediastinal contours are within normal limits. Both lungs are clear. The visualized skeletal structures are unremarkable. IMPRESSION: No active disease. Electronically Signed   By: Dahlia Bailiff MD   On: 01/27/2021 18:36     Assessment and Plan:   1. WCT     C/w VT, symptomatic with progressive weakness and in the ER underwent cardioversion  LHC with  severe CAD (CTO RCA and LCx), LVEF 45-50% Likely scar mediated. Home was on coreg 12.5mg  BID I do not see hx of arrhythmias or AAD in the past Has been maintained on amiodarone gtt since ER presentation  Dr. Curt Bears has seen and examined the patient, discussed treatment strategies, amiodarone without ICD was recommended, the patient agreeable. Zineb Glade transition to PO amio when current gtt completes Out to tele today   2. CAD     Sever bu stable     HS Trop felt to be demand 2/2 VT     No recurrent symptoms   Risk Assessment/Risk Scores:  {For questions or updates, please contact [facility] Please consult [email or web address] for contact info under    Signed, Baldwin Jamaica, PA-C  01/28/2021 2:09 PM  I have seen and examined this patient with Tommye Standard.  Agree with above, note added to reflect my findings.  On exam, RRR, no murmurs.  Patient presented to the hospital with ventricular tachycardia.  He had a left heart catheterization at [place] that showed stable coronary artery disease.  He was transferred here due to his VT.  He is currently on an amiodarone drip.  He has had no further VT.  We Tra [place] switch him to p.o. amiodarone today.  Per the AVID trial, with an ejection fraction greater than 35%, he would likely not benefit that much from an ICD over amiodarone.  We Lenix Kidd continue with current management.  Kailand Seda M. Breshay Ilg MD 01/30/2021 9:48 AM

## 2021-01-28 NOTE — H&P (Addendum)
Cardiology Admission History and Physical:   Patient ID: Garrett Hunter MRN: [id number]; DOB: 1946-09-28   Admission date: 01/28/2021  Primary Care Provider: Derinda Late, MD Primary Cardiologist: [name] Primary Electrophysiologist:  None   Chief Complaint:  Ventricular tachycardia  Patient Profile:   Garrett Hunter is a 75 y.o. male with hx of coronary artery disease with multiple MI's, CTO's of RCA and LCx / DES to ramus in 2014, chronic combined systolic and diastolic heart failure (EF 45 to 50%, G2DD, 01/28/2021), AAA s/p endovascular repair at [facility], aortic root dilation (39 mm, 01/28/2021) ischemic cardiomyopathy, hypertension, and hyperlipidemia who is being seen today for the evaluation of sustained ventricular tachycardia and with plan for transfer from [facility] to [facility] for formal evaluation by EP for ICD.  History of Present Illness:   Garrett Hunter is a 75 yo male with PMH as above.   Up until his symptoms began 4/25, he had not experienced any chest pain, shortness of breath, palpitations, lightheadedness, or edema.  He reported being compliant with his medications.  On 01/27/21 at around 2 PM, he had just finished working in his yard and was taking his shoes off when he suddenly felt short of breath with associated chest discomfort and pounding in his chest.  He could not characterize the chest pain further but noted that it was 3/10 in intensity and different than what he has experienced in the past with his MI's.  He also felt lightheaded but decided to take a shower before going to his wife's shop.  After getting out of the shower, his chest pain and palpitations had improved but Garrett Hunter felt profoundly fatigued and almost passed out.    He called 911 and was found to be in a wide complex tachycardia when EMS arrived.  He reportedly received adenosine and amiodarone without significant change in his rhythm.    He was brought to [facility] ED 01/27/21.  He was seen  in the ED 4/25 by [name] with subsequent cardioversion performed and restoration of sinus rhythm. 9/56/38 DCCV was complicated by transient respiratory suppression that was treated with flumazenil. He was maintained on amiodarone and heparin infusions overnight in the [facility] ICU.   At presentation, telemetry showed VT with cardioversion to NSR. Overnight, he had PACs and PVCs with brief 9 beat run of NSVT by telemetry.  [facility] EKG reviewed and showed NSR with IVCD and nonspecific ST/T abnormalities.  HS Tn has continued to cycle overnight with HS Tn 2,162 and still cycling (4/26) without pt report of CP. He was evaluated early 4/26 and reported his symptoms had all resolved shortly after the cardioversion 01/27/21.    He underwent LHC 01/28/21 by Dr. Saunders Revel with stable dz as outlined below. Cath showed severe 2v CAD with CTO of pLCx and RCA. Minimal luminal irregularities in the LAD were without significant stenosis. Widely patent ramus intermedius stent was noted with moderately elevated LV filling pressure. LVEDP 25-4mmHg and moderately elevated.    It was thought that his VT was likely scar mediated. Gentle diuresis was recommended as well as aggressive secondary prevention of CAD.   Echo 01/28/2021 with EF 45 to 50%, LV global hypokinesis, mild LVH, G2 DD.  Akinesis of the left ventricular, basal inferior segment noted.  Mild LAE and trivial MR noted.  Mild dilation of the aortic root measured 39 mm.  EP consultation recommended with decision to transfer to [facility] for formal EP evaluation and consideration for ICD given  VT. [facility] contacted 01/28/2021 and are aware of the plan for transfer from [facility] to [facility]. [facility] has been contacted for transfer. Internal medicine contacted at [facility] to complete discharge and EMTALA form.   Heart Pathway Score:     Past Medical History:  Diagnosis Date  . AAA (abdominal aortic aneurysm) (HCC)    Small noted during cardiac catheterization in  2011.  [name] Benign neoplasm of colon   . CHF (congestive heart failure) (HCC)   . Chronic airway obstruction, not elsewhere classified   . Coronary artery disease    Inferior MI in 1996 treated with TPA. Cardiac cath in 2011 at [facility] showed chronic occlusion of the proximal RCA and proximal left circumflex without obstructive disease in the LAD. Non-ST elevation myocardial infarction in October 2013 while on vacation in [place]. Cardiac catheterization showed plaque rupture in the proximal ramus with thrombus. He had balloon angioplasty done.   . Coronary atherosclerosis of native coronary artery   . Hepatitis A    teenager  . Hyperlipidemia    Intolerance to statins.  . Hypertension   . MI (myocardial infarction) (HCC)    x 3 - last one 2013    Past Surgical History:  Procedure Laterality Date  . ABDOMINAL AORTIC ANEURYSM REPAIR     11/16/2016  . ANGIOPLASTY    . CARDIAC CATHETERIZATION  2011  . CARDIAC CATHETERIZATION  2/14   [facility]; 95% lesion in the ramus intermedius.   [name] CATARACT EXTRACTION W/PHACO Left 12/19/2018   Procedure: CATARACT EXTRACTION PHACO AND INTRAOCULAR LENS PLACEMENT (IOC)  LEFT;  Surgeon: Nevada Crane, MD;  Location: [facility];  Service: Ophthalmology;  Laterality: Left;  . CATARACT EXTRACTION W/PHACO Right 03/06/2019   Procedure: CATARACT EXTRACTION PHACO AND INTRAOCULAR LENS PLACEMENT (IOC)RIGHT;  Surgeon: Nevada Crane, MD;  Location: [facility];  Service: Ophthalmology;  Laterality: Right;  . CORONARY ANGIOPLASTY  1996   [facility] x1  . CORONARY ANGIOPLASTY  2/14   Severe restenosis in the ostial ramus. Status post angioplasty and drug-eluting stent placement with a 2.5 x 18 mm Xience drug-eluting stent  . ESOPHAGOGASTRODUODENOSCOPY N/A 08/03/2018   Procedure: ESOPHAGOGASTRODUODENOSCOPY (EGD);  Surgeon: Toney Reil, MD;  Location: [facility] END[facility];  Service: Gastroenterology;  Laterality: N/A;  . ESOPHAGOGASTRODUODENOSCOPY (EGD) WITH  PROPOFOL N/A 11/03/2018   Procedure: ESOPHAGOGASTRODUODENOSCOPY (EGD) WITH PROPOFOL;  Surgeon: Toney Reil, MD;  Location: [facility] ENDOSCOPY;  Service: Gastroenterology;  Laterality: N/A;  . ESOPHAGOGASTRODUODENOSCOPY (EGD) WITH PROPOFOL N/A 12/06/2018   Procedure: ESOPHAGOGASTRODUODENOSCOPY (EGD) WITH PROPOFOL;  Surgeon: Midge Minium, MD;  Location: [facility] ENDOSCOPY;  Service: Endoscopy;  Laterality: N/A;  . TONSILLECTOMY AND ADENOIDECTOMY       Medications Prior to Admission: Prior to Admission medications   Medication Sig Start Date Garrett Hunter Date Taking? Authorizing Provider  acetaminophen (TYLENOL) 500 MG tablet Take 500 mg by mouth every 6 (six) hours as needed for mild pain or moderate pain.    [provider]  Ascorbic Acid (VITAMIN C) 100 MG tablet Take 100 mg by mouth daily.    [provider]  aspirin EC 81 MG tablet Take 81 mg by mouth daily.    [provider]  carvedilol (COREG) 12.5 MG tablet TAKE 1 TABLET TWICE A DAY Patient taking differently: Take 12.5 mg by mouth 2 (two) times daily with a meal. 08/01/20   Iran Ouch, MD  Cholecalciferol (VITAMIN D3) 1.25 MG (50000 UT) CAPS Take 1.25 mg by mouth daily. Patient  not taking: No sig reported    [provider]  Coenzyme Q10 (COQ-10) 100 MG CAPS Take 100 mg by mouth daily.     [provider]  Javier Docker Oil 1000 MG CAPS Take 1,000 mg by mouth daily.  Patient not taking: No sig reported    [provider]  lisinopril-hydrochlorothiazide (ZESTORETIC) 20-12.5 MG tablet Take 2 tablets by mouth daily. 06/17/20   Wellington Hampshire, MD  Multiple Vitamin (MULTI-VITAMIN) tablet Take 1 tablet by mouth daily.    [provider]  nitroGLYCERIN (NITROSTAT) 0.4 MG SL tablet Place 0.4 mg under the tongue every 5 (five) minutes as needed for chest pain. Patient not taking: No sig reported    [provider]  rosuvastatin (CRESTOR) 5 MG tablet TAKE 1 TABLET DAILY Patient taking  differently: Take 5 mg by mouth daily. 08/01/20   Wellington Hampshire, MD  zinc gluconate 50 MG tablet Take 50 mg by mouth daily.    [provider]     Allergies:    Allergies  Allergen Reactions  . Crestor [Rosuvastatin]   . Livalo [Pitavastatin]     myalgia  . Pravachol [Pravastatin Sodium]   . Zocor [Simvastatin]     Social History:   Social History   Tobacco Use  . Smoking status: Former Smoker    Packs/day: 2.00    Years: 40.00    Pack years: 80.00    Types: Cigarettes    Quit date: 2004    Years since quitting: 18.3  . Smokeless tobacco: Former Systems developer    Types: Secondary school teacher  . Vaping Use: Never used  Substance Use Topics  . Alcohol use: Yes    Alcohol/week: 3.0 standard drinks    Types: 3 Cans of beer per week    Comment: occassionally  . Drug use: No     Family History:   The patient's family history includes Cancer (age of onset: 74) in his mother; Heart attack in his father; Hypertension in his brother and sister.    ROS:  Please see the history of present illness.  ROS as outlined above in HPI.     Physical Exam/Data:   Vitals:   01/28/21 1700 01/28/21 1713 01/28/21 1800 01/28/21 1900  BP: (!) 144/82  (!) 142/69 122/75  Pulse:  65    Resp: 16  20 16     Intake/Output Summary (Last 24 hours) at 01/28/2021 1924 Last data filed at 01/28/2021 1707 Gross per 24 hour  Intake --  Output 450 ml  Net -450 ml   Last 3 Weights 01/28/2021 01/28/2021 01/27/2021  Weight (lbs) 228 lb 6.3 oz 228 lb 6.3 oz 224 lb 3.3 oz  Weight (kg) 103.6 kg 103.6 kg 101.7 kg  Some encounter information is confidential and restricted. Go to Review Flowsheets activity to see all data.     There is no height or weight on file to calculate BMI.  Per MD progress note   EKG:  The ECG personally reviewed and demonstrates NSR with IVCD and nonspecific ST/T changes.   Relevant CV Studies: [facility] 01/28/21 Coronary Diagrams   Diagnostic Dominance:  Co-dominant      Conclusions: 1. 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  There are minimal luminal irregularities in the LAD without significant stenosis. 2. Widely patent ramus intermedius stent. 3. Moderately elevated left ventricular filling pressure. Recommendations: 1. Continue IV amiodarone and ICU monitoring in the setting of sustained  ventricular tachycardia that is likely scar mediated. 2. Gentle diuresis. 3. Aggressive secondary prevention of coronary artery disease. 4. EP consultation for further management of VT.  Echo 01/28/21 1. Left ventricular ejection fraction, by estimation, is 45 to 50%. The  left ventricle has mildly decreased function. The left ventricle  demonstrates global hypokinesis. There is mild left ventricular  hypertrophy. Left ventricular diastolic parameters  are consistent with Grade II diastolic dysfunction (pseudonormalization).  There is akinesis of the left ventricular, basal inferior segment.  2. Right ventricular systolic function is normal. The right ventricular  size is normal.  3. Left atrial size was mildly dilated.  4. The mitral valve is grossly normal. Trivial mitral valve  regurgitation. No evidence of mitral stenosis.  5. The aortic valve was not well visualized. Aortic valve regurgitation  is not visualized. No aortic stenosis is present.  6. Aortic dilatation noted. There is mild dilatation of the aortic root,  measuring 39 mm.    TTE (10/07/2016): - Left ventricle: The cavity size was normal. Systolic function was  normal. The estimated ejection fraction was in the range of 50%  to 55%. Hypokinesis of the basal inferior myocardium. Doppler  parameters are consistent with abnormal left ventricular  relaxation (grade 1 diastolic dysfunction).  - Aortic root: The aortic root was mildly dilated, 3.9 cm.  Ascending aortia not well visualized. Aortic arch  is normal size.  - Left atrium: The atrium was mildly dilated.  - Right ventricle: Systolic function was normal.  - Pulmonary arteries: Systolic pressure was within the normal  range.   Laboratory Data:  High Sensitivity Troponin:   Recent Labs  Lab 01/28/21 0031 01/28/21 0255 01/28/21 0431 01/28/21 0640 01/28/21 0921  TROPONINIHS 652* 1,272* 1,509* 1,844* 2,162*      Cardiac EnzymesNo results for input(s): TROPONINI in the last 168 hours. No results for input(s): TROPIPOC in the last 168 hours.  Chemistry Recent Labs  Lab 01/27/21 1754 01/28/21 0431  NA 138 138  K 4.3 3.7  CL 101 100  CO2 25 26  GLUCOSE 173* 120*  BUN 23 26*  CREATININE 1.39* 1.39*  CALCIUM 9.9 9.3  GFRNONAA 54* 54*  ANIONGAP 12 12    Recent Labs  Lab 01/27/21 1754  PROT 7.1  ALBUMIN 4.5  AST 32  ALT 32  ALKPHOS 76  BILITOT 0.7   Hematology Recent Labs  Lab 01/27/21 1754 01/28/21 0431  WBC 11.4* 13.1*  RBC 4.49 4.25  HGB 14.8 14.0  HCT 44.2 41.7  MCV 98.4 98.1  MCH 33.0 32.9  MCHC 33.5 33.6  RDW 13.0 13.2  PLT 213 166   BNPNo results for input(s): BNP, PROBNP in the last 168 hours.  DDimer No results for input(s): DDIMER in the last 168 hours.   Radiology/Studies:  CARDIAC CATHETERIZATION  Result Date: 01/28/2021 Conclusions: 1. 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  There are minimal luminal irregularities in the LAD without significant stenosis. 2. Widely patent ramus intermedius stent. 3. Moderately elevated left ventricular filling pressure. Recommendations: 1. Continue IV amiodarone and ICU monitoring in the setting of sustained ventricular tachycardia that is likely scar mediated. 2. Gentle diuresis. 3. Aggressive secondary prevention of coronary artery disease. 4. EP consultation for further management of VT. Garrett Bush, MD [facility]   DG Chest Port 1 View  Result Date: 01/27/2021 CLINICAL DATA:   Chest pain and tachycardia EXAM: PORTABLE CHEST 1 VIEW COMPARISON:  November 21, 2012. FINDINGS: The fibrillator leads overlie the left chest. The heart size and mediastinal contours are within normal limits. Both lungs are clear. The visualized skeletal structures are unremarkable. IMPRESSION: No active disease. Electronically Signed   By: Dahlia Bailiff MD   On: 01/27/2021 18:36   ECHOCARDIOGRAM COMPLETE  Result Date: 01/28/2021    ECHOCARDIOGRAM REPORT   Patient Name:   Garrett Hunter [facility] Date of Exam: 01/28/2021 Medical Rec #:  [id number]          Height:       71.0 in Accession #:    [id number]         Weight:       228.4 lb Date of Birth:  1946-06-01          BSA:          2.231 m Patient Age:    46 years           BP:           142/77 mmHg Patient Gender: M                  HR:           64 bpm. Exam Location:  [facility] Procedure: 2D Echo, Cardiac Doppler and Color Doppler Indications:     Ventricular Tachycardia I47.2  History:         Patient has prior history of Echocardiogram examinations, most                  recent 10/07/2016. CHF, Previous Myocardial Infarction; Risk                  Factors:Hypertension.  Sonographer:     Sherrie Sport RDCS (AE) Referring Phys:  [id number] Garrett Hunter Diagnosing Phys: Harrell Gave Eleen Litz MD  Sonographer Comments: Suboptimal parasternal window and Technically challenging study due to limited acoustic windows. IMPRESSIONS  1. Left ventricular ejection fraction, by estimation, is 45 to 50%. The left ventricle has mildly decreased function. The left ventricle demonstrates global hypokinesis. There is mild left ventricular hypertrophy. Left ventricular diastolic parameters are consistent with Grade II diastolic dysfunction (pseudonormalization). There is akinesis of the left ventricular, basal inferior segment.  2. Right ventricular systolic function is normal. The right ventricular size is normal.  3. Left atrial size was mildly dilated.  4. The mitral valve is grossly  normal. Trivial mitral valve regurgitation. No evidence of mitral stenosis.  5. The aortic valve was not well visualized. Aortic valve regurgitation is not visualized. No aortic stenosis is present.  6. Aortic dilatation noted. There is mild dilatation of the aortic root, measuring 39 mm. FINDINGS  Left Ventricle: Left ventricular ejection fraction, by estimation, is 45 to 50%. The left ventricle has mildly decreased function. The left ventricle demonstrates global hypokinesis. The left ventricular internal cavity size was normal in size. There is  mild left ventricular hypertrophy. Left ventricular diastolic parameters are consistent with Grade II diastolic dysfunction (pseudonormalization). Right Ventricle: The right ventricular size is normal. No increase in right ventricular wall thickness. Right ventricular systolic function is normal. Left Atrium: Left atrial size was mildly dilated. Right Atrium: Right atrial size was normal in size. Pericardium: The pericardium was not well visualized. Mitral Valve: The mitral valve is grossly normal. Mild mitral annular calcification. Trivial mitral valve regurgitation. No evidence of mitral valve stenosis. Tricuspid Valve: The tricuspid valve is not well visualized. Tricuspid valve regurgitation is trivial. Aortic Valve: The aortic valve was not well  visualized. Aortic valve regurgitation is not visualized. No aortic stenosis is present. Aortic valve mean gradient measures 1.0 mmHg. Aortic valve peak gradient measures 2.1 mmHg. Aortic valve area, by VTI measures 3.20 cm. Pulmonic Valve: The pulmonic valve was not well visualized. Aorta: Aortic dilatation noted. There is mild dilatation of the aortic root, measuring 39 mm. IAS/Shunts: The interatrial septum was not well visualized.  LEFT VENTRICLE PLAX 2D LVIDd:         4.44 cm  Diastology LVIDs:         3.22 cm  LV e' medial:    4.13 cm/s LV PW:         1.33 cm  LV E/e' medial:  20.4 LV IVS:        1.01 cm  LV e' lateral:    5.66 cm/s LVOT diam:     2.30 cm  LV E/e' lateral: 14.9 LV SV:         53 LV SV Index:   24 LVOT Area:     4.15 cm  RIGHT VENTRICLE RV Basal diam:  2.86 cm RV S prime:     10.65 cm/s LEFT ATRIUM             Index       RIGHT ATRIUM           Index LA diam:        3.90 cm 1.75 cm/m  RA Area:     16.70 cm LA Vol (A2C):   88.2 ml 39.53 ml/m RA Volume:   43.20 ml  19.36 ml/m LA Vol (A4C):   75.7 ml 33.93 ml/m LA Biplane Vol: 82.5 ml 36.98 ml/m  AORTIC VALVE AV Area (Vmax):    3.45 cm AV Area (Vmean):   2.95 cm AV Area (VTI):     3.20 cm AV Vmax:           73.20 cm/s AV Vmean:          56.100 cm/s AV VTI:            0.166 m AV Peak Grad:      2.1 mmHg AV Mean Grad:      1.0 mmHg LVOT Vmax:         60.70 cm/s LVOT Vmean:        39.900 cm/s LVOT VTI:          0.128 m LVOT/AV VTI ratio: 0.77  AORTA Ao Root diam: 3.83 cm MITRAL VALVE               TRICUSPID VALVE MV Area (PHT): 5.79 cm    TR Peak grad:   7.1 mmHg MV Decel Time: 131 msec    TR Vmax:        133.00 cm/s MV E velocity: 84.40 cm/s MV A velocity: 42.80 cm/s  SHUNTS MV E/A ratio:  1.97        Systemic VTI:  0.13 m                            Systemic Diam: 2.30 cm Garrett Bush MD Electronically signed by Garrett Bush MD Signature Date/Time: 01/28/2021/10:32:38 AM    Final     Assessment and Plan:   Ventricular tachycardia: --Presented with sustained VT and s/p 4/25 DCCV by [name]. Maintaining NSR without further sustained ventricular arrhythmia. S/p LHC 4/26 by Dr. Saunders Revel with stable findings and known CAD as above. VT suspected to be  scar mediated.  Continue IV amiodarone.  Continue carvedilol 3.125 mg BID with titration as heart rate allows.  Transfer [facility] with [facility] EP formal evaluation for antiarrhythmic management as well as consideration of ICD placement for secondary prevention.  Demand ischemia: --No CP. HS Tn elevated to 2162 and continues to cycle until peaked and down-trending. HS Tn elevation likely  2/2 demand ischemia in the setting of known severe 2-vessel CAD, sustained VT, and DCCV 4/25.  S/p LHC as above stable findings and cath report as copied / pasted above.  Restart rosuvastatin 5 mg daily.  ASA received last before catheterization.  Aggressive secondary prevention of CAD.   Chronic combined systolic and diastolic heart failure --Denies SOB. S/p LHC with moderately elevated LVEDP as above. Echo shows EF 45-50% with G2DD. Continue gentle IV diuresis with monitoring of renal function. Monitor I/Os, daily wts. Transfer to [facility] for consideration of ICD placement as above. Escalation of GDMT as renal function and vitals allow.   Hypertension --BP improving with DBP still elevated on most recent check.  Continue carvedilol 3.125 mg BID.  Continue gentle IV diuresis with daily BMET.  Restart lisinopril-HCTZ after catheterization if Cr stable.  Hyperlipidemia:  01/28/21 LDL 61 and at goal.  Continue rosuvastatin 5 mg daily.   Severity of Illness: The appropriate patient status for this patient is INPATIENT. Inpatient status is judged to be reasonable and necessary in order to provide the required intensity of service to ensure the patient's safety. The patient's presenting symptoms, physical exam findings, and initial radiographic and laboratory data in the context of their chronic comorbidities is felt to place them at high risk for further clinical deterioration. Furthermore, it is not anticipated that the patient will be medically stable for discharge from the hospital within 2 midnights of admission. The following factors support the patient status of inpatient.   " The patient's presenting symptoms include VT . " The worrisome physical exam findings include VT " The initial radiographic and laboratory data are worrisome because of VT. " The chronic co-morbidities include VT   * I certify that at the point of admission it is my clinical judgment that the patient  will require inpatient hospital care spanning beyond 2 midnights from the point of admission due to high intensity of service, high risk for further deterioration and high frequency of surveillance required.*    For questions or updates, please contact [place] Please consult [email or web address] for contact info under     Signed, Arvil Chaco, PA-C  01/28/2021 12:58 PM    I have independently seen and examined the patient and agree with the findings and plan, as documented in the PA/NP's note, with the following additions/changes.  Patient is a 75 year old man transferred from [facility] due to sustained ventricular tachycardia.  He has a history of coronary artery disease status post multiple MIs and known CTO's of the LCx and RCA.  He also underwent PCI to the ramus intermedius in 2014.  He was in his usual state of health until he had sudden onset of chest pain, palpitations, shortness of breath, and lightheadedness yesterday after having worked outside.  After about an hour, he called EMS and was found to be in a wide-complex tachycardia that did not respond to IV adenosine or amiodarone.  He ultimately required cardioversion due to sustained ventricular tachycardia.  He has maintained sinus rhythm with only a single brief episode of NSVT overnight.  He underwent cardiac catheterization in  the setting of ventricular tachycardia and elevated troponin today, demonstrating stable appearance of the coronary arteries with widely patent LAD and ramus intermedius (including prior stent).  LCx and RCA remain chronically occluded with collaterals.  LVEDP was moderately elevated, prompting gentle diuresis prior to transfer.  Echocardiogram at [facility] showed mildly reduced LVEF with subtle global hypokinesis as well as basal inferior akinesis and possible aneurysm formation.  Mr. Srader is asymptomatic at this time.  Physical exam is notable for regular rate and rhythm without murmurs.  Lungs are clear.  There  is no lower extremity edema.  Plan is for patient to maintained on IV amiodarone in 2H-CVICU overnight with EP consultation to determine best antiarrhythmic strategy going forward as well as consideration for ICD placement for secondary prevention.  Continue current medications for secondary prevention of CAD, as well as carvedilol that was added today for blood pressure control and additional suppression of VT.  Garrett Bush, MD [place] [facility]

## 2021-01-28 NOTE — Progress Notes (Signed)
Noted large hematome (approx. 3 in. X 2 in wide) just below TR band. Immed. Pressure held; called cath lab and Jeneen Rinks, tech. In to hold pressure to wrist/radial site. 2 more ml air added toTR band for a total of 12 ml. Pt. Tolerating manual pressure well.

## 2021-01-28 NOTE — Plan of Care (Signed)
  Problem: Clinical Measurements: Goal: Will remain free from infection Outcome: Progressing Goal: Diagnostic test results will improve Outcome: Progressing Goal: Cardiovascular complication will be avoided Outcome: Progressing   Problem: Activity: Goal: Risk for activity intolerance will decrease Outcome: Progressing   

## 2021-01-28 NOTE — Progress Notes (Signed)
NAME:  Garrett Hunter, MRN:  [id number], DOB:  [birth year], LOS: 1 ADMISSION DATE:  01/27/2021, CONSULTATION DATE: 01/27/2021 REFERRING MD: Dr. Corky Downs, CHIEF COMPLAINT: Chest pain  History of Present Illness:  75 year old male with significant cardiac history and now with VT  ED course: EKG consistent with ventricular tachycardia Dr. Corky Downs discussed the case with Dr. Rayann Heman of [facility] cardiology who recommended an additional bolus of amiodarone followed by an amiodarone infusion.PCCM consulted as the patient remains in a wide-complex tachycardia after an hour on amiodarone drip following his second bolus. Cardioversion performed   Pertinent  Medical History  CAD s/p Inferior MI in 1996 treated with TPA. Cardiac cath in 2011 at [facility] showed chronic occlusion of the proximal RCA and proximal left circumflex without obstructive disease in the LAD. Non-ST elevation myocardial infarction in October 2013 while on vacation in [place]. Cardiac catheterization showed plaque rupture in the proximal ramus with thrombus. He had balloon angioplasty done.   Significant Hospital Events:  . 4/25 admitted to ICU for V refractory V tach, cardioversion   ASSESSMENT & PLAN  75 yo obese white male with acute V tach unstable arrhythmia with ischemic cardiomyopathy with renal failure patient with previous h/o  MI and CAD  ACUTE CARDIAC FAILURE- Unstable V tach -Maintain K>4 Mg >2 -QTc approaching 500 -Oxygen as needed to maintain sats >92% -Lasix as tolerated for volume reduction -Trend troponin for peak, still climbing 1844 -Appreciate cardiology consultation:      *planning for LHC today      *EP consultation pending cath for AICD and antiarrhythmics -Maintain pads and vigilance for potential emergent cardioversion   ACUTE KIDNEY INJURY/ELECTROLYTES Lab Results  Component Value Date   CREATININE 1.39 (H) 01/28/2021   BUN 26 (H) 01/28/2021   NA 138 01/28/2021   K 3.7 01/28/2021   CL 100 01/28/2021    CO2 26 01/28/2021  -continue Foley Catheter-assess need -Avoid nephrotoxic agents as possible outside of cath requirements -Follow urine output, BMP -Ensure adequate renal perfusion, optimize oxygenation -Renal dose medications -follow labs as needed -replace as needed -pharmacy consultation and following   Best practice (right click and "Reselect all SmartList Selections" daily)  Diet:  NPO Pain/Anxiety/Delirium protocol (if indicated): No VAP protocol (if indicated): Not indicated DVT prophylaxis: Systemic AC GI prophylaxis: N/A Glucose control:  SSI Yes Central venous access:  N/A Arterial line:  N/A Foley:  N/A Mobility:  bed rest  Code Status:  FULL CODE Disposition: ICU  OBJECTIVE  Blood pressure (!) 147/64, pulse 60, temperature 98.2 F (36.8 C), temperature source Oral, resp. rate (!) 25, height 5\' 11"  (1.803 m), weight 103.6 kg, SpO2 98 %.        Intake/Output Summary (Last 24 hours) at 01/28/2021 0745 Last data filed at 01/28/2021 9518 Gross per 24 hour  Intake 878.37 ml  Output 375 ml  Net 503.37 ml   Filed Weights   01/27/21 1751 01/27/21 2025 01/28/21 0500  Weight: 106.1 kg 101.7 kg 103.6 kg   Physical Exam Constitutional:      General: He is not in acute distress.    Appearance: He is obese.     Interventions: He is not intubated. HENT:     Head: Normocephalic.  Eyes:     Extraocular Movements: Extraocular movements intact.     Pupils: Pupils are equal, round, and reactive to light.  Cardiovascular:     Rate and Rhythm: Normal rate. Occasional extrasystoles are present.    Pulses: Normal pulses.  Heart sounds: Normal heart sounds.     Comments: Trace LE edema Pulmonary:     Effort: Pulmonary effort is normal. No accessory muscle usage. He is not intubated.     Breath sounds: Normal air entry. Examination of the right-lower field reveals decreased breath sounds. Examination of the left-lower field reveals decreased breath sounds. Decreased  breath sounds present.  Abdominal:     General: Bowel sounds are normal. There is no distension.     Palpations: Abdomen is soft.     Tenderness: There is no abdominal tenderness. There is no guarding or rebound.  Musculoskeletal:     Cervical back: Normal range of motion.  Neurological:     Mental Status: He is alert and oriented to person, place, and time.     GCS: GCS eye subscore is 4. GCS verbal subscore is 5. GCS motor subscore is 6.     LABS   Basic Metabolic Panel: Recent Labs  Lab 01/27/21 1754 01/28/21 0431  NA 138 138  K 4.3 3.7  CL 101 100  CO2 25 26  GLUCOSE 173* 120*  BUN 23 26*  CREATININE 1.39* 1.39*  CALCIUM 9.9 9.3  MG 2.2 2.2  PHOS  --  4.4   GFR: Estimated Creatinine Clearance: 58.8 mL/min (A) (by C-G formula based on SCr of 1.39 mg/dL (H)). Recent Labs  Lab 01/27/21 1754 01/28/21 0431  WBC 11.4* 13.1*    Liver Function Tests: Recent Labs  Lab 01/27/21 1754  AST 32  ALT 32  ALKPHOS 76  BILITOT 0.7  PROT 7.1  ALBUMIN 4.5   No results for input(s): LIPASE, AMYLASE in the last 168 hours. No results for input(s): AMMONIA in the last 168 hours.  ABG No results found for: PHART, PCO2ART, PO2ART, HCO3, TCO2, ACIDBASEDEF, O2SAT   Coagulation Profile: Recent Labs  Lab 01/27/21 1754  INR 1.0    Cardiac Enzymes: No results for input(s): CKTOTAL, CKMB, CKMBINDEX, TROPONINI in the last 168 hours.  HbA1C: No results found for: HGBA1C  CBG: Recent Labs  Lab 01/27/21 2027  GLUCAP 253*     Allergies Allergies  Allergen Reactions  . Crestor [Rosuvastatin]   . Livalo [Pitavastatin]     myalgia  . Pravachol [Pravastatin Sodium]   . Zocor [Simvastatin]      Home Medications  Prior to Admission medications   Medication Sig Start Date End Date Taking? Authorizing Provider  acetaminophen (TYLENOL) 500 MG tablet Take 500 mg by mouth every 6 (six) hours as needed for mild pain or moderate pain.   Yes [provider]   Ascorbic Acid (VITAMIN C) 100 MG tablet Take 100 mg by mouth daily.   Yes [provider]  aspirin EC 81 MG tablet Take 81 mg by mouth daily.   Yes [provider]  carvedilol (COREG) 12.5 MG tablet TAKE 1 TABLET TWICE A DAY Patient taking differently: Take 12.5 mg by mouth 2 (two) times daily with a meal. 08/01/20  Yes Wellington Hampshire, MD  Coenzyme Q10 (COQ-10) 100 MG CAPS Take 100 mg by mouth daily.    Yes [provider]  lisinopril-hydrochlorothiazide (ZESTORETIC) 20-12.5 MG tablet Take 2 tablets by mouth daily. 06/17/20  Yes Wellington Hampshire, MD  Multiple Vitamin (MULTI-VITAMIN) tablet Take 1 tablet by mouth daily.   Yes [provider]  rosuvastatin (CRESTOR) 5 MG tablet TAKE 1 TABLET DAILY Patient taking differently: Take 5 mg by mouth daily. 08/01/20  Yes Wellington Hampshire, MD  zinc  gluconate 50 MG tablet Take 50 mg by mouth daily.   Yes [provider]  Cholecalciferol (VITAMIN D3) 1.25 MG (50000 UT) CAPS Take 1.25 mg by mouth daily. Patient not taking: No sig reported    [provider]  Javier Docker Oil 1000 MG CAPS Take 1,000 mg by mouth daily.  Patient not taking: No sig reported    [provider]  nitroGLYCERIN (NITROSTAT) 0.4 MG SL tablet Place 0.4 mg under the tongue every 5 (five) minutes as needed for chest pain. Patient not taking: No sig reported    [provider]      DVT/GI PRX  assessed I Assessed the need for Labs I Assessed the need for Foley I Assessed the need for Central Venous Line Family Discussion when available I Assessed the need for Mobilization I made an Assessment of medications to be adjusted accordingly Safety Risk assessment completed  [place] ICU TEAM    Patient seen and examined face to face fashion Images of the day and lab values reviewed Time spent managing patient:40 min

## 2021-01-28 NOTE — Progress Notes (Signed)
ANTICOAGULATION CONSULT NOTE - Initial Consult  Pharmacy Consult for Heparin Indication: chest pain/ACS  Allergies  Allergen Reactions  . Crestor [Rosuvastatin]   . Livalo [Pitavastatin]     myalgia  . Pravachol [Pravastatin Sodium]   . Zocor [Simvastatin]     Patient Measurements: Height: 5\' 11"  (180.3 cm) Weight: 103.6 kg (228 lb 6.3 oz) IBW/kg (Calculated) : 75.3 Heparin Dosing Weight: 96.4 kg   Vital Signs: Temp: 97.9 F (36.6 C) (04/26 0400) Temp Source: Oral (04/26 0400) BP: 138/73 (04/26 0600) Pulse Rate: 63 (04/26 0600)  Labs: Recent Labs    01/27/21 1754 01/27/21 2046 01/28/21 0031 01/28/21 0255 01/28/21 0431  HGB 14.8  --   --   --  14.0  HCT 44.2  --   --   --  41.7  PLT 213  --   --   --  166  APTT 29  --   --   --   --   LABPROT 12.6  --   --   --   --   INR 1.0  --   --   --   --   HEPARINUNFRC  --   --   --   --  0.42  CREATININE 1.39*  --   --   --  1.39*  TROPONINIHS 32*   < > 652* 1,272* 1,509*   < > = values in this interval not displayed.    Estimated Creatinine Clearance: 58.8 mL/min (A) (by C-G formula based on SCr of 1.39 mg/dL (H)).   Medical History: Past Medical History:  Diagnosis Date  . AAA (abdominal aortic aneurysm) ([place])    Small noted during cardiac catheterization in 2011.  [name] Benign neoplasm of colon   . CHF (congestive heart failure) ([place])   . Chronic airway obstruction, not elsewhere classified   . Coronary artery disease    Inferior MI in 1996 treated with TPA. Cardiac cath in 2011 at [facility] showed chronic occlusion of the proximal RCA and proximal left circumflex without obstructive disease in the LAD. Non-ST elevation myocardial infarction in October 2013 while on vacation in [place]. Cardiac catheterization showed plaque rupture in the proximal ramus with thrombus. He had balloon angioplasty done.   . Coronary atherosclerosis of native coronary artery   . Hepatitis A    teenager  . Hyperlipidemia    Intolerance to  statins.  . Hypertension   . MI (myocardial infarction) ([place])    x 3 - last one 2013    Medications:  Medications Prior to Admission  Medication Sig Dispense Refill Last Dose  . acetaminophen (TYLENOL) 500 MG tablet Take 500 mg by mouth every 6 (six) hours as needed for mild pain or moderate pain.   prn at prn  . Ascorbic Acid (VITAMIN C) 100 MG tablet Take 100 mg by mouth daily.   prn at prn  . aspirin EC 81 MG tablet Take 81 mg by mouth daily.   01/27/2021 at Unknown time  . carvedilol (COREG) 12.5 MG tablet TAKE 1 TABLET TWICE A DAY (Patient taking differently: Take 12.5 mg by mouth 2 (two) times daily with a meal.) 180 tablet 1 01/27/2021 at 0900  . Coenzyme Q10 (COQ-10) 100 MG CAPS Take 100 mg by mouth daily.    01/26/2021 at Unknown time  . lisinopril-hydrochlorothiazide (ZESTORETIC) 20-12.5 MG tablet Take 2 tablets by mouth daily. 10 tablet 0 01/27/2021 at 0900  . Multiple Vitamin (MULTI-VITAMIN) tablet Take 1 tablet by mouth daily.  01/27/2021 at Unknown time  . rosuvastatin (CRESTOR) 5 MG tablet TAKE 1 TABLET DAILY (Patient taking differently: Take 5 mg by mouth daily.) 90 tablet 1 01/26/2021 at Unknown time  . zinc gluconate 50 MG tablet Take 50 mg by mouth daily.   prn at prn  . Cholecalciferol (VITAMIN D3) 1.25 MG (50000 UT) CAPS Take 1.25 mg by mouth daily. (Patient not taking: No sig reported)   Completed Course at Unknown time  . Krill Oil 1000 MG CAPS Take 1,000 mg by mouth daily.  (Patient not taking: No sig reported)   Not Taking at Unknown time  . nitroGLYCERIN (NITROSTAT) 0.4 MG SL tablet Place 0.4 mg under the tongue every 5 (five) minutes as needed for chest pain. (Patient not taking: No sig reported)   Not Taking at Unknown time    Assessment: 75 y.o.malewith medical history significant for 3 past MI's and AAA who presents with complaints of dizziness and chest discomfort. Pharmacy consulted to dose heparin for ACS/NSTEMI.   No prior anticoag noted. Baseline H&H, aPTT, INR  and plt WNL. Pt going to CATH lab 4/26.  Goal of Therapy:  Heparin level 0.3-0.7 units/ml Monitor platelets by anticoagulation protocol: Yes   Plan:   HL therapetuic at 0.42, continue heparin infusion at 1350 units/hr  Check anti-Xa level in 8 hours and daily while on heparin  Continue to monitor H&H and platelets  Sherilyn Banker, PharmD Pharmacy Resident  01/28/2021 7:09 AM

## 2021-01-28 NOTE — H&P (View-Only) (Signed)
Cardiology Consultation:   Patient ID: Garrett Hunter MRN: [id number]; DOB: 1946-04-13  Admit date: 01/27/2021 Date of Consult: 01/28/2021  PCP:  Derinda Late, MD   [place]  Cardiologist:  Kathlyn Sacramento, MD   Patient Profile:   Garrett Hunter is a 75 y.o. male with a hx of coronary artery disease with multiple MI's, CTO's of RCA and LCx, and DES to ramus in 2014, AAA s/p endovascular repair at [facility], ischemic cardiomyopathy, hypertension, and hyperlipidemia who is being seen today for the evaluation of sustained ventricular tachycardia at the request of Dr. Mortimer Fries.  History of Present Illness:   Garrett Hunter was in his usual state of health until yesterday at ~2 PM.  He had just finished working in his yard and was taking his shoes off when he suddenly felt short of breath with associated chest discomfort and pounding in his chest.  He cannot characterize the chest pain further but notes that it was 3/10 in intensity and different than what he has experienced in the past with his MI's.  He also felt lightheaded but decided to take a shower before going to his wife's shop.  After getting out of the shower, his chest pain and palpitations had improved but Garrett Hunter felt profoundly fatigued and almost passed out.  He called 911 and was found to be in a wide complex tachycardia when EMS arrived.  He reportedly received adenosine and amiodarone without significant change in his rhythm.  He was seen in the ED by [name] and underwent cardioversion with restoration of sinus rhythm.  Procedure was complicated by transient respiratory suppression that was treated with flumazenil.  He has been maintained on amiodarone and heparin infusions.  Currently, Garrett Hunter reports feeling well.  His symptoms all resolved shortly after the cardioversion yesterday.  Up until his symptoms began yesterday, he had not experienced any recent chest pain, shortness of breath,  palpitations, lightheadedness, or edema.  He reports being compliant with his medications.   Past Medical History:  Diagnosis Date  . AAA (abdominal aortic aneurysm) ([place])    Small noted during cardiac catheterization in 2011.  [name] Benign neoplasm of colon   . CHF (congestive heart failure) ([place])   . Chronic airway obstruction, not elsewhere classified   . Coronary artery disease    Inferior MI in 1996 treated with TPA. Cardiac cath in 2011 at [facility] showed chronic occlusion of the proximal RCA and proximal left circumflex without obstructive disease in the LAD. Non-ST elevation myocardial infarction in October 2013 while on vacation in [place]. Cardiac catheterization showed plaque rupture in the proximal ramus with thrombus. He had balloon angioplasty done.   . Coronary atherosclerosis of native coronary artery   . Hepatitis A    teenager  . Hyperlipidemia    Intolerance to statins.  . Hypertension   . MI (myocardial infarction) ([place])    x 3 - last one 2013    Past Surgical History:  Procedure Laterality Date  . ABDOMINAL AORTIC ANEURYSM REPAIR     11/16/2016  . ANGIOPLASTY    . CARDIAC CATHETERIZATION  2011  . CARDIAC CATHETERIZATION  2/14   [facility]; 95% lesion in the ramus intermedius.   [name] CATARACT EXTRACTION W/PHACO Left 12/19/2018   Procedure: CATARACT EXTRACTION PHACO AND INTRAOCULAR LENS PLACEMENT ([place])  LEFT;  Surgeon: Eulogio Bear, MD;  Location: [place];  Service: Ophthalmology;  Laterality: Left;  . CATARACT EXTRACTION W/PHACO Right 03/06/2019  Procedure: CATARACT EXTRACTION PHACO AND INTRAOCULAR LENS PLACEMENT (IOC)RIGHT;  Surgeon: Eulogio Bear, MD;  Location: [place];  Service: Ophthalmology;  Laterality: Right;  . CORONARY ANGIOPLASTY  1996   [facility] x1  . CORONARY ANGIOPLASTY  2/14   Severe restenosis in the ostial ramus. Status post angioplasty and drug-eluting stent placement with a 2.5 x 18 mm Xience drug-eluting stent  .  ESOPHAGOGASTRODUODENOSCOPY N/A 08/03/2018   Procedure: ESOPHAGOGASTRODUODENOSCOPY (EGD);  Surgeon: Lin Landsman, MD;  Location: [facility] ENDOSCOPY;  Service: Gastroenterology;  Laterality: N/A;  . ESOPHAGOGASTRODUODENOSCOPY (EGD) WITH PROPOFOL N/A 11/03/2018   Procedure: ESOPHAGOGASTRODUODENOSCOPY (EGD) WITH PROPOFOL;  Surgeon: Lin Landsman, MD;  Location: [facility] ENDOSCOPY;  Service: Gastroenterology;  Laterality: N/A;  . ESOPHAGOGASTRODUODENOSCOPY (EGD) WITH PROPOFOL N/A 12/06/2018   Procedure: ESOPHAGOGASTRODUODENOSCOPY (EGD) WITH PROPOFOL;  Surgeon: Lucilla Lame, MD;  Location: [facility] ENDOSCOPY;  Service: Endoscopy;  Laterality: N/A;  . TONSILLECTOMY AND ADENOIDECTOMY       Home Medications:  Prior to Admission medications   Medication Sig Start Date Margy Sumler Date Taking? Authorizing Provider  acetaminophen (TYLENOL) 500 MG tablet Take 500 mg by mouth every 6 (six) hours as needed for mild pain or moderate pain.   Yes [provider]  Ascorbic Acid (VITAMIN C) 100 MG tablet Take 100 mg by mouth daily.   Yes [provider]  aspirin EC 81 MG tablet Take 81 mg by mouth daily.   Yes [provider]  carvedilol (COREG) 12.5 MG tablet TAKE 1 TABLET TWICE A DAY Patient taking differently: Take 12.5 mg by mouth 2 (two) times daily with a meal. 08/01/20  Yes Wellington Hampshire, MD  Coenzyme Q10 (COQ-10) 100 MG CAPS Take 100 mg by mouth daily.    Yes [provider]  lisinopril-hydrochlorothiazide (ZESTORETIC) 20-12.5 MG tablet Take 2 tablets by mouth daily. 06/17/20  Yes Wellington Hampshire, MD  Multiple Vitamin (MULTI-VITAMIN) tablet Take 1 tablet by mouth daily.   Yes [provider]  rosuvastatin (CRESTOR) 5 MG tablet TAKE 1 TABLET DAILY Patient taking differently: Take 5 mg by mouth daily. 08/01/20  Yes Wellington Hampshire, MD  zinc gluconate 50 MG tablet Take 50 mg by mouth daily.   Yes [provider]  Cholecalciferol (VITAMIN D3) 1.25 MG (50000  UT) CAPS Take 1.25 mg by mouth daily. Patient not taking: No sig reported    [provider]  Javier Docker Oil 1000 MG CAPS Take 1,000 mg by mouth daily.  Patient not taking: No sig reported    [provider]  nitroGLYCERIN (NITROSTAT) 0.4 MG SL tablet Place 0.4 mg under the tongue every 5 (five) minutes as needed for chest pain. Patient not taking: No sig reported    [provider]    Inpatient Medications: Scheduled Meds: . Chlorhexidine Gluconate Cloth  6 each Topical Daily  . fentaNYL      . mouth rinse  15 mL Mouth Rinse BID  . midazolam      . mupirocin ointment  1 application Nasal BID  . potassium chloride  40 mEq Oral Once   Continuous Infusions: . amiodarone 30 mg/hr (01/28/21 0605)  . heparin 1,350 Units/hr (01/28/21 0605)   PRN Meds: docusate sodium, polyethylene glycol  Allergies:    Allergies  Allergen Reactions  . Crestor [Rosuvastatin]   . Livalo [Pitavastatin]     myalgia  . Pravachol [Pravastatin Sodium]   . Zocor [Simvastatin]     Social History:   Social History   Tobacco  Use  . Smoking status: Former Smoker    Packs/day: 2.00    Years: 40.00    Pack years: 80.00    Types: Cigarettes    Quit date: 2004    Years since quitting: 18.3  . Smokeless tobacco: Former Systems developer    Types: Secondary school teacher  . Vaping Use: Never used  Substance Use Topics  . Alcohol use: Yes    Alcohol/week: 3.0 standard drinks    Types: 3 Cans of beer per week    Comment: occassionally  . Drug use: No     Family History:   Family History  Problem Relation Age of Onset  . Cancer Mother 52       stomach  . Heart attack Father   . Hypertension Sister   . Hypertension Brother      ROS:  Please see the history of present illness. All other ROS reviewed and negative.     Physical Exam/Data:   Vitals:   01/28/21 0500 01/28/21 0600 01/28/21 0700 01/28/21 0730  BP: (!) 146/73 138/73 (!) 147/64   Pulse: (!) 59 63 60   Resp: 15 13 (!) 25    Temp:    98.2 F (36.8 C)  TempSrc:    Oral  SpO2: 99% 99% 98%   Weight: 103.6 kg     Height:        Intake/Output Summary (Last 24 hours) at 01/28/2021 0745 Last data filed at 01/28/2021 2841 Gross per 24 hour  Intake 878.37 ml  Output 375 ml  Net 503.37 ml   Last 3 Weights 01/28/2021 01/27/2021 01/27/2021  Weight (lbs) 228 lb 6.3 oz 224 lb 3.3 oz 234 lb  Weight (kg) 103.6 kg 101.7 kg 106.142 kg  Some encounter information is confidential and restricted. Go to Review Flowsheets activity to see all data.     Body mass index is 31.85 kg/m.  General:  Well nourished, well developed, in no acute distress HEENT: normal Lymph: no adenopathy Neck: no JVD Endocrine:  No thryomegaly Vascular: No carotid bruits; FA pulses 2+ bilaterally without bruits  Cardiac:  normal S1, S2; RRR; no murmurs, rubs, or gallops Lungs:  clear to auscultation bilaterally, no wheezing, rhonchi or rales  Abd: soft, nontender, no hepatomegaly  Ext: no edema Musculoskeletal:  No deformities, BUE and BLE strength normal and equal Skin: warm and dry  Neuro:  CNs 2-12 intact, no focal abnormalities noted Psych:  Normal affect   EKG:  The EKG performed earlier today was personally reviewed and demonstrates:  NSR with IVCD and nonspecific ST/T abnormalities.  Telemetry:  Telemetry was personally reviewed and demonstrates:  VT on presentation with cardioversion to NSR at 2215 yesterday.  Since, then he has been in NSR with PAC's and PVC's.  A single 9-beat run of NSVT occurred at 0249 this morning.  Relevant CV Studies: TTE (10/07/2016): - Left ventricle: The cavity size was normal. Systolic function was  normal. The estimated ejection fraction was in the range of 50%  to 55%. Hypokinesis of the basal inferior myocardium. Doppler  parameters are consistent with abnormal left ventricular  relaxation (grade 1 diastolic dysfunction).  - Aortic root: The aortic root was mildly dilated, 3.9 cm.  Ascending  aortia not well visualized. Aortic arch is normal size.  - Left atrium: The atrium was mildly dilated.  - Right ventricle: Systolic function was normal.  - Pulmonary arteries: Systolic pressure was within the normal  range.   Laboratory Data:  High Sensitivity  Troponin:   Recent Labs  Lab 01/27/21 2236 01/28/21 0031 01/28/21 0255 01/28/21 0431 01/28/21 0640  TROPONINIHS 218* 652* 1,272* 1,509* 1,844*     Chemistry Recent Labs  Lab 01/27/21 1754 01/28/21 0431  NA 138 138  K 4.3 3.7  CL 101 100  CO2 25 26  GLUCOSE 173* 120*  BUN 23 26*  CREATININE 1.39* 1.39*  CALCIUM 9.9 9.3  GFRNONAA 54* 54*  ANIONGAP 12 12    Recent Labs  Lab 01/27/21 1754  PROT 7.1  ALBUMIN 4.5  AST 32  ALT 32  ALKPHOS 76  BILITOT 0.7   Hematology Recent Labs  Lab 01/27/21 1754 01/28/21 0431  WBC 11.4* 13.1*  RBC 4.49 4.25  HGB 14.8 14.0  HCT 44.2 41.7  MCV 98.4 98.1  MCH 33.0 32.9  MCHC 33.5 33.6  RDW 13.0 13.2  PLT 213 166   BNPNo results for input(s): BNP, PROBNP in the last 168 hours.  DDimer No results for input(s): DDIMER in the last 168 hours.   Radiology/Studies:  DG Chest Port 1 View  Result Date: 01/27/2021 CLINICAL DATA:  Chest pain and tachycardia EXAM: PORTABLE CHEST 1 VIEW COMPARISON:  November 21, 2012. FINDINGS: The fibrillator leads overlie the left chest. The heart size and mediastinal contours are within normal limits. Both lungs are clear. The visualized skeletal structures are unremarkable. IMPRESSION: No active disease. Electronically Signed   By: Dahlia Bailiff MD   On: 01/27/2021 18:36     Assessment and Plan:   Ventricular tachycardia: Patient's presentation consistent with sustained ventricular tachycardia, as seen on presenting EKG's on arrival in the ED.  He required DCCV by [name] last night but has been maintaining NSR without further sustained ventricular arrhythmia.  I suspect this is most likely scar mediated, though worsening of his CAD  is certainly a possibility.  Continue IV amiodarone.  Restart carvedilol 3.125 mg BID with titration as heart rate allows.  Plan for LHC this AM to exclude worsening CAD.  EP consultation tomorrow to assist with antiarrhythmic management as well as consideration of ICD placement for secondary prevention.  Demand ischemia: I suspect elevated troponin is due to demand ischemia in the setting of known severe 2-vessel CAD, sustained VT, and DCCV last night.  Mr. Lizarraga is currently chest-pain free.  Continue IV heparin pending catheterization.  Continue ASA 81 mg daily.  Plan for LHC and possible PCI this AM.  Restart home rosuvastatin 5 mg daily.  Hypertension BP mildly elevated this AM.  Restart carvedilol 3.125 mg BID.  Hold lisinopril-HCTZ pending cath.  Hyperlipidemia:  Check FLP.  Restart rosuvastatin 5 mg daily.  Shared Decision Making/Informed Consent The risks [stroke (1 in 1000), death (1 in 1000), kidney failure [usually temporary] (1 in 500), bleeding (1 in 200), allergic reaction [possibly serious] (1 in 200)], benefits (diagnostic support and management of coronary artery disease) and alternatives of a cardiac catheterization were discussed in detail with Mr. Hausman and he is willing to proceed.  For questions or updates, please contact [place] Please consult [email or web address] for contact info under [facility].  Signed, Nelva Bush, MD  01/28/2021 7:45 AM

## 2021-01-28 NOTE — Progress Notes (Signed)
[place] orders prepped and pended - unable to be released until account assigned to patient.

## 2021-01-28 NOTE — Interval H&P Note (Signed)
History and Physical Interval Note:  01/28/2021 10:44 AM  Garrett Hunter  has presented today for surgery, with the diagnosis of ventricular tachycardia.  The various methods of treatment have been discussed with the patient and family. After consideration of risks, benefits and other options for treatment, the patient has consented to  Procedure(s): LEFT HEART CATH AND CORONARY ANGIOGRAPHY (N/A) as a surgical intervention.  The patient's history has been reviewed, patient examined, no change in status, stable for surgery.  I have reviewed the patient's chart and labs.  Questions were answered to the patient's satisfaction.    Cath Lab Visit (complete for each Cath Lab visit)  Clinical Evaluation Leading to the Procedure:   ACS: Yes.    Non-ACS:  N/A  Raylene Carmickle

## 2021-01-29 ENCOUNTER — Other Ambulatory Visit: Payer: Self-pay

## 2021-01-29 ENCOUNTER — Encounter (HOSPITAL_COMMUNITY): Payer: Self-pay | Admitting: Cardiology

## 2021-01-29 DIAGNOSIS — I472 Ventricular tachycardia: Principal | ICD-10-CM

## 2021-01-29 LAB — BASIC METABOLIC PANEL
Anion gap: 7 (ref 5–15)
BUN: 20 mg/dL (ref 8–23)
CO2: 27 mmol/L (ref 22–32)
Calcium: 8.9 mg/dL (ref 8.9–10.3)
Chloride: 104 mmol/L (ref 98–111)
Creatinine, Ser: 1.18 mg/dL (ref 0.61–1.24)
GFR, Estimated: >60 mL/min (ref >60)
Glucose, Bld: 98 mg/dL (ref 70–99)
Potassium: 3.5 mmol/L (ref 3.5–5.1)
Sodium: 138 mmol/L (ref 135–145)

## 2021-01-29 MED ORDER — POTASSIUM CHLORIDE CRYS ER 20 MEQ PO TBCR
40.0000 meq | EXTENDED_RELEASE_TABLET | Freq: Once | ORAL | Status: AC
  Administered 2021-01-29: 40 meq via ORAL
  Filled 2021-01-29: qty 2

## 2021-01-29 MED ORDER — CARVEDILOL 12.5 MG PO TABS
12.5000 mg | ORAL_TABLET | Freq: Two times a day (BID) | ORAL | Status: DC
  Administered 2021-01-29 – 2021-02-02 (×7): 12.5 mg via ORAL
  Filled 2021-01-29 (×7): qty 1

## 2021-01-29 MED ORDER — AMIODARONE HCL 200 MG PO TABS
400.0000 mg | ORAL_TABLET | Freq: Two times a day (BID) | ORAL | Status: DC
  Administered 2021-01-29 – 2021-02-02 (×9): 400 mg via ORAL
  Filled 2021-01-29 (×9): qty 2

## 2021-01-29 MED ORDER — CHLORHEXIDINE GLUCONATE CLOTH 2 % EX PADS
6.0000 | MEDICATED_PAD | Freq: Every day | CUTANEOUS | Status: DC
  Administered 2021-01-30 – 2021-02-02 (×4): 6 via TOPICAL

## 2021-01-29 MED ORDER — CARVEDILOL 12.5 MG PO TABS: 12.5000 mg | ORAL_TABLET | Freq: Two times a day (BID) | ORAL | Status: DC

## 2021-01-29 NOTE — Discharge Summary (Incomplete)
DISCHARGE SUMMARY    Patient ID: AAIDEN Hunter,  MRN: [id number], DOB/AGE: 1946-08-04 75 y.o.  Admit date: 01/28/2021 Discharge date: ***   Primary Care Physician: Derinda Late, MD  Primary Cardiologist: [name] Electrophysiologist: new  Primary Discharge Diagnosis:  1. VT  Secondary Discharge Diagnosis:  1. CAD 2. AAA     prior endovascular repair 3. HTN 4. ICM   Allergies  Allergen Reactions  . Crestor [Rosuvastatin]     Cramps. (Hight dose of Crestor)  . Livalo [Pitavastatin]     myalgia  . Pravachol [Pravastatin Sodium]     Cramps  . Zocor [Simvastatin]     Cramps     Procedures This Admission:     Brief HPI:*** Garrett Hunter is a 75 y.o. male w/PMHx as above was in his USOH day of admission had worked in the yard and when getting ready to shower and sudden onset of palpitations and some degree of chest discomfort unlike his anginal symptoms historically. He showered and ultimately became more symptomatic, weak, and called 911, found in WCT, given adenosine and amio with rhythm change. In the ER communication with EP/Dr. Allred given another bolus of amiodarone, BP remained stable, though some notes report increased symptoms and was seen in the ER  by [name] with persistent VT and cardioverted to SR, started on heparin gtt and amiodarone gtt   Hospital Course:  The patient was admitted placed on heparin gtt and maintained on a amiodarone gtt. He had echo with LVEF 45-50%, grade II DD, and cath with CTO of RCA and LCx, patent ramus stent and minimal luminal irregularities of the LAD, mod elevated LVEDP planned for gentle diuresis and transfer to [facility] for further management and EP evaluation.   HS Trop abnormal and felt to be demand from VT and cardioversion.  He transferred to [facility] 01/28/21, he was seen by EP service 01/29/21 by Dr. Curt Bears.   He had not had further VT on amiodarone.  Discussed management strategies for VT and planned to be  maintained on amiodarone for arrhythmia suppression.  He was transitioned to PO amiodarone.  ***  He was examined by Dr. Curt Bears and considered stable for discharge to home.   *** driving *** resume home coreg dose  Physical Exam: Vitals:   01/29/21 0900 01/29/21 0949 01/29/21 1000 01/29/21 1100  BP: (!) 155/80 (!) 155/80 (!) 142/97 (!) 161/80  Pulse: 70 71 69 72  Resp: 16  18 16   Temp:      TempSrc:      SpO2: 95%  96% 96%  Weight:        GEN- The patient is well appearing, alert and oriented x 3 today.   HEENT: normocephalic, atraumatic; sclera clear, conjunctiva pink; hearing intact; oropharynx clear Lungs- *** CTA b/l, normal work of breathing.  No wheezes, rales, rhonchi Heart- *** RRR, no murmurs, rubs or gallops, PMI not laterally displaced GI- soft, non-tender, non-distended Extremities- no clubbing, cyanosis, or edema MS- no significant deformity or atrophy Skin- warm and dry, no rash or lesion, left *** chest without hematoma/ecchymosis Psych- euthymic mood, full affect Neuro- no gross defecits  Labs:   Lab Results  Component Value Date   WBC 14.0 (H) 01/28/2021   HGB 13.7 01/28/2021   HCT 40.6 01/28/2021   MCV 98.1 01/28/2021   PLT 172 01/28/2021    Recent Labs  Lab 01/27/21 1754 01/28/21 0431 01/28/21 2345  NA 138   < >  138  K 4.3   < > 3.5  CL 101   < > 104  CO2 25   < > 27  BUN 23   < > 20  CREATININE 1.39*   < > 1.18  CALCIUM 9.9   < > 8.9  PROT 7.1  --   --   BILITOT 0.7  --   --   ALKPHOS 76  --   --   ALT 32  --   --   AST 32  --   --   GLUCOSE 173*   < > 98   < > = values in this interval not displayed.    Discharge Medications:  Allergies as of 01/29/2021      Reactions   Crestor [rosuvastatin]    Cramps. (Hight dose of Crestor)   Livalo [pitavastatin]    myalgia   Pravachol [pravastatin Sodium]    Cramps   Zocor [simvastatin]    Cramps    Med Rec must be completed prior to using this [place]***       Disposition:  Home   Follow-up Information    [name], MD Follow up.   Specialties: Cardiology, Radiology Why: 02/19/21 @ 3:40PM Contact information: [place] [address] [phone]               Duration of Discharge Encounter: Greater than 30 minutes including physician time.  Venetia Night, PA-C 01/29/2021 5:30 PM

## 2021-01-30 ENCOUNTER — Inpatient Hospital Stay (HOSPITAL_COMMUNITY): Payer: Medicare Other

## 2021-01-30 ENCOUNTER — Inpatient Hospital Stay (HOSPITAL_COMMUNITY): Payer: Medicare Other | Admitting: Registered Nurse

## 2021-01-30 ENCOUNTER — Encounter (HOSPITAL_COMMUNITY)
Admission: AD | Disposition: A | Discharge status: Home / Self Care | Payer: Self-pay | Source: Other Acute Inpatient Hospital | Attending: Neurology

## 2021-01-30 DIAGNOSIS — I6340 Cerebral infarction due to embolism of unspecified cerebral artery: Secondary | ICD-10-CM | POA: Insufficient documentation

## 2021-01-30 DIAGNOSIS — I6330 Cerebral infarction due to thrombosis of unspecified cerebral artery: Secondary | ICD-10-CM | POA: Insufficient documentation

## 2021-01-30 HISTORY — PX: IR CT HEAD LTD: IMG2386

## 2021-01-30 HISTORY — PX: IR PERCUTANEOUS ART THROMBECTOMY/INFUSION INTRACRANIAL INC DIAG ANGIO: IMG6087

## 2021-01-30 HISTORY — PX: RADIOLOGY WITH ANESTHESIA: SHX6223

## 2021-01-30 LAB — CBC
HCT: 43.5 % (ref 39.0–52.0)
Hemoglobin: 14.4 g/dL (ref 13.0–17.0)
MCH: 33.2 pg (ref 26.0–34.0)
MCHC: 33.1 g/dL (ref 30.0–36.0)
MCV: 100.2 fL — ABNORMAL HIGH (ref 80.0–100.0)
Platelets: 192 10*3/uL (ref 150–400)
RBC: 4.34 MIL/uL (ref 4.22–5.81)
RDW: 13.2 % (ref 11.5–15.5)
WBC: 14.8 10*3/uL — ABNORMAL HIGH (ref 4.0–10.5)
nRBC: 0 % (ref 0.0–0.2)

## 2021-01-30 LAB — BASIC METABOLIC PANEL
Anion gap: 10 (ref 5–15)
Anion gap: 10 (ref 5–15)
BUN: 16 mg/dL (ref 8–23)
BUN: 18 mg/dL (ref 8–23)
CO2: 25 mmol/L (ref 22–32)
CO2: 25 mmol/L (ref 22–32)
Calcium: 9.2 mg/dL (ref 8.9–10.3)
Calcium: 8.8 mg/dL — ABNORMAL LOW (ref 8.9–10.3)
Chloride: 103 mmol/L (ref 98–111)
Chloride: 101 mmol/L (ref 98–111)
Creatinine, Ser: 1.13 mg/dL (ref 0.61–1.24)
Creatinine, Ser: 1.09 mg/dL (ref 0.61–1.24)
GFR, Estimated: >60 mL/min (ref >60)
GFR, Estimated: >60 mL/min (ref >60)
Glucose, Bld: 87 mg/dL (ref 70–99)
Glucose, Bld: 181 mg/dL — ABNORMAL HIGH (ref 70–99)
Potassium: 4.2 mmol/L (ref 3.5–5.1)
Potassium: 4.5 mmol/L (ref 3.5–5.1)
Sodium: 138 mmol/L (ref 135–145)
Sodium: 136 mmol/L (ref 135–145)

## 2021-01-30 LAB — LIPID PANEL
Cholesterol: 119 mg/dL (ref 0–200)
HDL: 38 mg/dL — ABNORMAL LOW (ref >40)
LDL Cholesterol: 64 mg/dL (ref 0–99)
Total CHOL/HDL Ratio: 3.1 RATIO
Triglycerides: 85 mg/dL (ref <150)
VLDL: 17 mg/dL (ref 0–40)

## 2021-01-30 LAB — HEMOGLOBIN A1C
Hgb A1c MFr Bld: 6 % — ABNORMAL HIGH (ref 4.8–5.6)
Mean Plasma Glucose: 125.5 mg/dL

## 2021-01-30 LAB — MAGNESIUM: Magnesium: 1.9 mg/dL (ref 1.7–2.4)

## 2021-01-30 LAB — GLUCOSE, CAPILLARY: Glucose-Capillary: 130 mg/dL — ABNORMAL HIGH (ref 70–99)

## 2021-01-30 SURGERY — IR WITH ANESTHESIA
Anesthesia: General

## 2021-01-30 MED ORDER — IOHEXOL 300 MG/ML  SOLN
50.0000 mL | Freq: Once | INTRAMUSCULAR | Status: AC | PRN
  Administered 2021-01-30: 40 mL via INTRA_ARTERIAL

## 2021-01-30 MED ORDER — CLEVIDIPINE BUTYRATE 0.5 MG/ML IV EMUL
0.0000 mg/h | INTRAVENOUS | Status: DC
  Administered 2021-01-30: 10 mg/h via INTRAVENOUS
  Filled 2021-01-30 (×2): qty 100
  Filled 2021-01-30: qty 200

## 2021-01-30 MED ORDER — EPHEDRINE SULFATE-NACL 50-0.9 MG/10ML-% IV SOSY
PREFILLED_SYRINGE | INTRAVENOUS | Status: DC | PRN
  Administered 2021-01-30 (×3): 2.5 mg via INTRAVENOUS
  Administered 2021-01-30 (×3): 5 mg via INTRAVENOUS

## 2021-01-30 MED ORDER — SUGAMMADEX SODIUM 200 MG/2ML IV SOLN
INTRAVENOUS | Status: DC | PRN
  Administered 2021-01-30: 200 mg via INTRAVENOUS

## 2021-01-30 MED ORDER — PROPOFOL 10 MG/ML IV BOLUS
INTRAVENOUS | Status: DC | PRN
  Administered 2021-01-30: 200 mg via INTRAVENOUS

## 2021-01-30 MED ORDER — STROKE: EARLY STAGES OF RECOVERY BOOK
Freq: Once | Status: DC
  Filled 2021-01-30 (×2): qty 1

## 2021-01-30 MED ORDER — SUCCINYLCHOLINE CHLORIDE 200 MG/10ML IV SOSY
PREFILLED_SYRINGE | INTRAVENOUS | Status: DC | PRN
  Administered 2021-01-30: 150 mg via INTRAVENOUS

## 2021-01-30 MED ORDER — PHENYLEPHRINE 40 MCG/ML (10ML) SYRINGE FOR IV PUSH (FOR BLOOD PRESSURE SUPPORT)
PREFILLED_SYRINGE | INTRAVENOUS | Status: DC | PRN
  Administered 2021-01-30: 80 ug via INTRAVENOUS

## 2021-01-30 MED ORDER — SODIUM CHLORIDE 0.9 % IV SOLN: INTRAVENOUS | Status: DC

## 2021-01-30 MED ORDER — LABETALOL HCL 5 MG/ML IV SOLN
10.0000 mg | Freq: Once | INTRAVENOUS | Status: AC
  Administered 2021-01-30: 10 mg via INTRAVENOUS

## 2021-01-30 MED ORDER — ESMOLOL HCL 100 MG/10ML IV SOLN
INTRAVENOUS | Status: DC | PRN
  Administered 2021-01-30: 10 mg via INTRAVENOUS

## 2021-01-30 MED ORDER — ONDANSETRON HCL 4 MG/2ML IJ SOLN
INTRAMUSCULAR | Status: DC | PRN
  Administered 2021-01-30: 4 mg via INTRAVENOUS

## 2021-01-30 MED ORDER — IOHEXOL 300 MG/ML  SOLN
150.0000 mL | Freq: Once | INTRAMUSCULAR | Status: AC | PRN
  Administered 2021-01-30: 90 mL via INTRA_ARTERIAL

## 2021-01-30 MED ORDER — LIDOCAINE 2% (20 MG/ML) 5 ML SYRINGE
INTRAMUSCULAR | Status: DC | PRN
  Administered 2021-01-30: 60 mg via INTRAVENOUS

## 2021-01-30 MED ORDER — IOHEXOL 350 MG/ML SOLN
75.0000 mL | Freq: Once | INTRAVENOUS | Status: AC | PRN
  Administered 2021-01-30: 75 mL via INTRAVENOUS

## 2021-01-30 MED ORDER — SODIUM CHLORIDE 0.9 % IV SOLN: INTRAVENOUS | Status: DC | PRN

## 2021-01-30 MED ORDER — ROCURONIUM BROMIDE 10 MG/ML (PF) SYRINGE
PREFILLED_SYRINGE | INTRAVENOUS | Status: DC | PRN
  Administered 2021-01-30: 20 mg via INTRAVENOUS
  Administered 2021-01-30: 60 mg via INTRAVENOUS
  Administered 2021-01-30: 20 mg via INTRAVENOUS

## 2021-01-30 MED ORDER — LACTATED RINGERS IV SOLN: INTRAVENOUS | Status: DC | PRN

## 2021-01-30 MED ORDER — ALTEPLASE (STROKE) FULL DOSE INFUSION
90.0000 mg | Freq: Once | INTRAVENOUS | Status: AC
  Administered 2021-01-30: 90 mg via INTRAVENOUS
  Filled 2021-01-30: qty 100

## 2021-01-30 MED ORDER — SODIUM CHLORIDE 0.9 % IV SOLN
50.0000 mL | Freq: Once | INTRAVENOUS | Status: AC
  Administered 2021-01-30: 50 mL via INTRAVENOUS

## 2021-01-30 MED ORDER — CLEVIDIPINE BUTYRATE 0.5 MG/ML IV EMUL
INTRAVENOUS | Status: DC | PRN
  Administered 2021-01-30: 1 mg/h via INTRAVENOUS

## 2021-01-30 MED ORDER — PANTOPRAZOLE SODIUM 40 MG IV SOLR
40.0000 mg | Freq: Every day | INTRAVENOUS | Status: DC
  Administered 2021-01-30: 40 mg via INTRAVENOUS
  Filled 2021-01-30: qty 40

## 2021-01-30 MED ORDER — EPTIFIBATIDE 20 MG/10ML IV SOLN
INTRAVENOUS | Status: AC
  Filled 2021-01-30: qty 10

## 2021-01-30 MED ORDER — EPTIFIBATIDE 20 MG/10ML IV SOLN
INTRAVENOUS | Status: AC | PRN
  Administered 2021-01-30 (×2): 2 mg via INTRAVENOUS

## 2021-01-30 MED ORDER — PHENYLEPHRINE HCL-NACL 10-0.9 MG/250ML-% IV SOLN
INTRAVENOUS | Status: DC | PRN
  Administered 2021-01-30: 25 ug/min via INTRAVENOUS

## 2021-01-30 NOTE — Progress Notes (Signed)
Transferring patient to neurology care and neuro ICU for BP management s/p tPA and following thrombectomy. Patient has not had 2' prevention ICD placed yet for VT however no recurrent ventricular rhythms post DCCV. Cardiology consult team will continue to follow for ICD timing following stability from stroke.

## 2021-01-30 NOTE — Progress Notes (Addendum)
STROKE TEAM PROGRESS NOTE   INTERVAL HISTORY His wife and son are at the bedside.  Patient is aphasic and cannot elicit clear words beyond "yeah" but patient does express his emotions through facial expression and some body language. Patient is able to shake and nod his head. He denies being in any pain but does communicate that he is hungry.  Patient was already in the hospital when the [place] was called, patient had initially presented to the hospital for sustained V Tach requiring cardioversion, 4/25. RN noted that patient appeared to have a sudden neurological focal deficit. Patient was noted to have aphasia with R facial droop. CT Head confirmed stroke and the patient was determined to be a candidate for thrombectomy and tPA. Patient had successful thrombectomy of the L M3 MCA.  Patient appears to be doing well post- procedure.  Vitals:   01/30/21 1100 01/30/21 1130 01/30/21 1200 01/30/21 1300  BP: 138/76 134/67 (!) 138/91 132/77  Pulse: 81 84 88 87  Resp: 18 16 (!) 28 (!) 30  Temp:      TempSrc:      SpO2: 97% 97% 97%   Weight:       CBC:  Recent Labs  Lab 01/28/21 1643 01/30/21 0827  WBC 14.0* 14.8*  HGB 13.7 14.4  HCT 40.6 43.5  MCV 98.1 100.2*  PLT 172 161   Basic Metabolic Panel:  Recent Labs  Lab 01/28/21 0431 01/28/21 1643 01/30/21 0046 01/30/21 0827  NA 138   < > 138 136  K 3.7   < > 4.2 4.5  CL 100   < > 103 101  CO2 26   < > 25 25  GLUCOSE 120*   < > 87 181*  BUN 26*   < > 16 18  CREATININE 1.39*   < > 1.13 1.09  CALCIUM 9.3   < > 9.2 8.8*  MG 2.2  --  1.9  --   PHOS 4.4  --   --   --    < > = values in this interval not displayed.   Lipid Panel:  Recent Labs  Lab 01/30/21 0530  CHOL 119  TRIG 85  HDL 38*  CHOLHDL 3.1  VLDL 17  LDLCALC 64   HgbA1c:  Recent Labs  Lab 01/30/21 0046  HGBA1C 6.0*   Urine Drug Screen: No results for input(s): LABOPIA, COCAINSCRNUR, LABBENZ, AMPHETMU, THCU, LABBARB in the last 168 hours.  Alcohol Level No  results for input(s): ETH in the last 168 hours.  IMAGING past 24 hours IR CT Head [facility]  Result Date: 01/30/2021 INDICATION: 75 year old male presents with acute right-sided 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 He is currently in-hospital for treatment of cardiac issues. EXAM: ULTRASOUND-GUIDED ACCESS LEFT COMMON FEMORAL ARTERY MECHANICAL THROMBECTOMY LEFT MCA BRANCHES DEPLOYMENT OF ANGIO-SEAL FOR HEMOSTASIS COMPARISON:  Same day CT imaging MEDICATIONS: 4 MG intra arterial Integrilin ANESTHESIA/SEDATION: The anesthesia team was present to provide general endotracheal tube anesthesia and for patient monitoring during the procedure. Intubation was performed in negative pressure Bay in neuro IR holding. Left radial arterial line was performed by the anesthesia team. Interventional neuro radiology nursing staff was also present. CONTRAST:  130 cc Omnipaque FLUOROSCOPY TIME:  Fluoroscopy Time: 69 minutes 6 seconds (2,406 mGy). COMPLICATIONS: None TECHNIQUE: Informed written consent was obtained from the patient's family after a thorough discussion of the procedural risks, benefits and alternatives. Specific risks discussed include: Bleeding, infection, contrast reaction, kidney injury/failure, need for further procedure/surgery,  arterial injury or dissection, embolization to new territory, intracranial hemorrhage (10-15% risk), neurologic deterioration, cardiopulmonary collapse, death. All questions were addressed. Maximal Sterile Barrier Technique was utilized including during the procedure including caps, mask, sterile gowns, sterile gloves, sterile drape, hand hygiene and skin antiseptic. A timeout was performed prior to the initiation of the procedure. The anesthesia team was present to provide general endotracheal tube anesthesia and for patient monitoring during the procedure. Interventional neuro radiology nursing staff was also present. FINDINGS: Initial Findings: Left common carotid artery: Normal  course caliber and contour. Calcified plaque at the left carotid bifurcation. Left external carotid artery: Patent with antegrade flow. Left internal carotid artery: Normal course caliber and contour of the cervical portion. Vertical and petrous segment patent with normal course caliber contour. Cavernous segment patent. Clinoid segment patent. Antegrade flow of the ophthalmic artery, with adequate choroidal blush. . Terminus patent. Fetal left PCA origin with a patent inflow of the posterior circulation contributing to some slip stream artifact of the flow into the left PCA. Left MCA: M1 segment is patent without significant atherosclerotic change. There is a 90 degree down turn of the distal M1 before the M2 division point. The inferior division of the MCA contributes to parietal flow and is the non dominant. The superior division contributes to the frontal and the majority of the parietal region, and is the dominant of the 2 branches. The occluded artery is a branch division of the superior division operculum branch, distal M2/proximal M3 with a corresponding absence of late arterial perfusion of the perisylvian region on the initial images. Significant tortuosity of the inferior and posterior division Left ACA: A 1 segment patent. A 2 segment perfuses the left territory. There is some cross flow through patent anterior communicating artery to the right hemisphere. Intraoperative findings: The tortuosity of the occluded superior branch was an obstacle for microcatheter placement. The majority of the 69 minutes of fluoroscopy utilized was the attempt of accessing this occluded branch. A nearly 360 loop was observed, which required several catheter and wire combinations. One pass was performed with the stent retriever after a successful catheter placement, exceeding 1 hour fluoroscopy time. Deployment of the 3 mm solitaire device was into the distal cortical branches crossing a small occlusive thrombus of the  affected artery. Given the physiologic response to hypertension after a single thrombectomy pass and the difficulty in navigating to this location, we elected not to proceed with a second attempt. Completion Findings: Left MCA: TICI 0: No perfusion or antegrade flow beyond site of occlusion, of the affected vessel. PROCEDURE: The anesthesia team was present to provide general endotracheal tube anesthesia and for patient monitoring during the procedure. Intubation was performed in negative pressure Bay in neuro IR holding. Interventional neuro radiology nursing staff was also present. Ultrasound survey of the left inguinal region was performed with images stored and sent to PACs. 11 blade scalpel was used to make a small incision. Blunt dissection was performed with US guidance. A micropuncture needle was used access the left common femoral artery under ultrasound. With excellent arterial blood flow returned, an .018 micro wire was passed through the needle, observed to enter the abdominal aorta under fluoroscopy. The needle was removed, and a micropuncture sheath was placed over the wire. The inner dilator and wire were removed, and an 035 wire was advanced under fluoroscopy into the abdominal aorta. The sheath was removed and a 25cm 55F straight vascular sheath was placed. The dilator was removed and the sheath was  flushed. Sheath was attached to pressurized and heparinized saline bag for constant forward flow. Initial angiogram was performed of the endovascular stent and left iliac system to assure that there was flow beyond the sheath, which was confirmed. A coaxial system was then advanced over the 035 wire through the sheath. This included a 95cm 087 "Walrus" balloon guide with coaxial 125cm Berenstein diagnostic catheter. This was advanced to the proximal descending thoracic aorta. Wire was then removed. Double flush of the catheter was performed. Catheter was then used to select the left common carotid artery.  Angiogram was performed. Using roadmap technique, the catheter was advanced over a standard glide wire into left cervical ICA, with distal position achieved of the balloon guide. The diagnostic catheter and the wire were removed. Double flush was then performed of the balloon guide. Formal angiogram was performed. Multiple obliquity and magnification views were performed to identify the paths of the occluded vessel. Road map function was used once the occluded vessel was identified. Copious back flush was performed and the balloon catheter was attached to heparinized and pressurized saline bag for forward flow. Given the size of the perceived vessel and the extreme tortuosity, we elected first to attempt a direct aspiration first strategy. A zoom 35 aspiration catheter was advanced through the balloon catheter with a coaxial synchro soft catheter. The zoom catheter was navigated into the superior division, although would not pass easily beyond the first division point. Given the significant resistance to advancing the aspiration catheter, we elected to abandon the strategy within aspiration catheter and attempt a stent retriever strategy with passage of only the microcatheter. Zoom 35 catheter was removed. We first used a Trevo Provue18 microcatheter and a synchro soft wire to navigate to the occlusion. The tortuosity of the system proved to be in obstacle 2 select the affected branch. We changed to an Aristotle microwire with the Trevo catheter. This was unsuccessful. We withdrew the microcatheter and exchanged for an angled headway Duo catheter. The synchro soft and the Aristotle wire were both used with the angled microcatheter. A double angled headliner wire 012 was attempted without success. The majority of the fluoroscopy time was necessary for these attempts of attempted catheter position. Given our sense that we would not be able to reach the occlusion for treatment, we elected to infuse 2 mg of intra  arterial Integrilin into the superior division at this time. Adequate flush was then performed. We did make an attempt for a final catheter position after the Integrilin infusion. Ultimately, on our final attempt, the angled headway duo catheter and a J curve 012 headliner wire selected the affected branch with the J wire advancing just beyond the occlusion site. With the microcatheter in position in the proximal branch, the J wire would not pass distally. The J wire was then exchanged for the Aristotle wire, which we navigated into a distal cortical branch location. The microcatheter was then passed beyond the occlusion site. Wire was removed and aspiration blood was performed to confirm a luminal position. We then elected to attempt stent retriever thrombectomy. A 3 mm x 20 mm solitaire device was selected. A rotating hemostatic valve was then attached to the back end of the microcatheter, and a pressurized and heparinized saline bag was attached to the catheter. The device was inserted into the rotating hemostatic valve. Back flush was achieved at the rotating hemostatic valve, and then the device was gently advanced through the microcatheter to the distal end. The retriever was then unsheathed  by withdrawing the microcatheter under fluoroscopy. Once the retriever was completely unsheathed, the microcatheter was carefully stripped from the delivery device. Once the device was completely open, a control angiogram was performed from the balloon guide. This identified a small filling defect centered within the length of the solitaire device, with flow through the solitaire device into the distal cortical branches of the affected territory. A 3 minute time interval was observed before the device was withdrawn. After 3 minutes, the balloon at the balloon guide catheter was then inflated under fluoroscopy for proximal flow arrest. Constant aspiration was then performed at the balloon guide, as the retriever was gently  and slowly withdrawn with fluoroscopic observation. Aspiration continued until the device was removed from the system. The Touhy was removed from the back end of the balloon guide and the device was retrieved. Free aspiration was confirmed at the hub of the balloon guide catheter, with free blood return confirmed. The balloon was then deflated, and a control angiogram was performed. After this initial pass, the artery remained occluded. The patient's blood pressure spike to 0000000 systolic with withdrawal of the catheter. Given the physiologic response, the location of the clot and the difficulty navigating to this site after 1 hour fluoroscopy time, we elected to withdraw from the case, so as not to commit any complication. Balloon guide was then removed. The skin at the puncture site was then cleaned with Chlorhexidine. Given the absence of stock of a suitable sized Angio-Seal, a 7 French sheath was placed at the puncture site on the J wire. 2 minutes observation with manual pressure was observed. The 7 French sheath was then removed and an 34F angioseal was deployed. Flat panel CT was performed. Patient was extubated once the CT was reviewed. Patient tolerated the procedure well. No complications were encountered and no significant blood loss encountered. IMPRESSION: Status post ultrasound-guided access left common femoral artery for left-sided cervical and cerebral angiogram and attempt mechanical thrombectomy of left opercular branch (M3) of the dominant superior division, with persisting TICI 0 flow of the affected branch after 1 pass solitaire stentreiver. Angio-Seal for hemostasis. Signed, Dulcy Fanny. Dellia Nims, RPVI [facility] Radiology PLAN: The patient was extubated. ICU status Target systolic blood pressure of less than 180, status post tPA Left hip straight time 6 hours Frequent neurovascular checks Repeat neurologic imaging with CT and/MRI at the discretion  of neurology team Electronically Signed   By: Corrie Mckusick D.O.   On: 01/30/2021 10:10   IR PERCUTANEOUS ART THROMBECTOMY/INFUSION INTRACRANIAL INC DIAG ANGIO  Result Date: 01/30/2021 INDICATION: 75 year old male presents with acute right-sided 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 He is currently in-hospital for treatment of cardiac issues. EXAM: ULTRASOUND-GUIDED ACCESS LEFT COMMON FEMORAL ARTERY MECHANICAL THROMBECTOMY LEFT MCA BRANCHES DEPLOYMENT OF ANGIO-SEAL FOR HEMOSTASIS COMPARISON:  Same day CT imaging MEDICATIONS: 4 MG intra arterial Integrilin ANESTHESIA/SEDATION: The anesthesia team was present to provide general endotracheal tube anesthesia and for patient monitoring during the procedure. Intubation was performed in negative pressure Bay in neuro IR holding. Left radial arterial line was performed by the anesthesia team. Interventional neuro radiology nursing staff was also present. CONTRAST:  130 cc Omnipaque FLUOROSCOPY TIME:  Fluoroscopy Time: 69 minutes 6 seconds (2,406 mGy). COMPLICATIONS: None TECHNIQUE: Informed written consent was obtained from the patient's family after a thorough discussion of the procedural risks, benefits and alternatives. Specific risks discussed include: Bleeding, infection, contrast reaction, kidney injury/failure, need for further procedure/surgery, arterial injury or  dissection, embolization to new territory, intracranial hemorrhage (10-15% risk), neurologic deterioration, cardiopulmonary collapse, death. All questions were addressed. Maximal Sterile Barrier Technique was utilized including during the procedure including caps, mask, sterile gowns, sterile gloves, sterile drape, hand hygiene and skin antiseptic. A timeout was performed prior to the initiation of the procedure. The anesthesia team was present to provide general endotracheal tube anesthesia and for patient monitoring during the procedure. Interventional neuro radiology nursing staff was also  present. FINDINGS: Initial Findings: Left common carotid artery: Normal course caliber and contour. Calcified plaque at the left carotid bifurcation. Left external carotid artery: Patent with antegrade flow. Left internal carotid artery: Normal course caliber and contour of the cervical portion. Vertical and petrous segment patent with normal course caliber contour. Cavernous segment patent. Clinoid segment patent. Antegrade flow of the ophthalmic artery, with adequate choroidal blush. . Terminus patent. Fetal left PCA origin with a patent inflow of the posterior circulation contributing to some slip stream artifact of the flow into the left PCA. Left MCA: M1 segment is patent without significant atherosclerotic change. There is a 90 degree down turn of the distal M1 before the M2 division point. The inferior division of the MCA contributes to parietal flow and is the non dominant. The superior division contributes to the frontal and the majority of the parietal region, and is the dominant of the 2 branches. The occluded artery is a branch division of the superior division operculum branch, distal M2/proximal M3 with a corresponding absence of late arterial perfusion of the perisylvian region on the initial images. Significant tortuosity of the inferior and posterior division Left ACA: A 1 segment patent. A 2 segment perfuses the left territory. There is some cross flow through patent anterior communicating artery to the right hemisphere. Intraoperative findings: The tortuosity of the occluded superior branch was an obstacle for microcatheter placement. The majority of the 69 minutes of fluoroscopy utilized was the attempt of accessing this occluded branch. A nearly 360 loop was observed, which required several catheter and wire combinations. One pass was performed with the stent retriever after a successful catheter placement, exceeding 1 hour fluoroscopy time. Deployment of the 3 mm solitaire device was into the  distal cortical branches crossing a small occlusive thrombus of the affected artery. Given the physiologic response to hypertension after a single thrombectomy pass and the difficulty in navigating to this location, we elected not to proceed with a second attempt. Completion Findings: Left MCA: TICI 0: No perfusion or antegrade flow beyond site of occlusion, of the affected vessel. PROCEDURE: The anesthesia team was present to provide general endotracheal tube anesthesia and for patient monitoring during the procedure. Intubation was performed in negative pressure Bay in neuro IR holding. Interventional neuro radiology nursing staff was also present. Ultrasound survey of the left inguinal region was performed with images stored and sent to PACs. 11 blade scalpel was used to make a small incision. Blunt dissection was performed with US guidance. A micropuncture needle was used access the left common femoral artery under ultrasound. With excellent arterial blood flow returned, an .018 micro wire was passed through the needle, observed to enter the abdominal aorta under fluoroscopy. The needle was removed, and a micropuncture sheath was placed over the wire. The inner dilator and wire were removed, and an 035 wire was advanced under fluoroscopy into the abdominal aorta. The sheath was removed and a 25cm 55F straight vascular sheath was placed. The dilator was removed and the sheath was flushed. Sheath was  attached to pressurized and heparinized saline bag for constant forward flow. Initial angiogram was performed of the endovascular stent and left iliac system to assure that there was flow beyond the sheath, which was confirmed. A coaxial system was then advanced over the 035 wire through the sheath. This included a 95cm 087 "Walrus" balloon guide with coaxial 125cm Berenstein diagnostic catheter. This was advanced to the proximal descending thoracic aorta. Wire was then removed. Double flush of the catheter was  performed. Catheter was then used to select the left common carotid artery. Angiogram was performed. Using roadmap technique, the catheter was advanced over a standard glide wire into left cervical ICA, with distal position achieved of the balloon guide. The diagnostic catheter and the wire were removed. Double flush was then performed of the balloon guide. Formal angiogram was performed. Multiple obliquity and magnification views were performed to identify the paths of the occluded vessel. Road map function was used once the occluded vessel was identified. Copious back flush was performed and the balloon catheter was attached to heparinized and pressurized saline bag for forward flow. Given the size of the perceived vessel and the extreme tortuosity, we elected first to attempt a direct aspiration first strategy. A zoom 35 aspiration catheter was advanced through the balloon catheter with a coaxial synchro soft catheter. The zoom catheter was navigated into the superior division, although would not pass easily beyond the first division point. Given the significant resistance to advancing the aspiration catheter, we elected to abandon the strategy within aspiration catheter and attempt a stent retriever strategy with passage of only the microcatheter. Zoom 35 catheter was removed. We first used a Trevo Provue18 microcatheter and a synchro soft wire to navigate to the occlusion. The tortuosity of the system proved to be in obstacle 2 select the affected branch. We changed to an Aristotle microwire with the Trevo catheter. This was unsuccessful. We withdrew the microcatheter and exchanged for an angled headway Duo catheter. The synchro soft and the Aristotle wire were both used with the angled microcatheter. A double angled headliner wire 012 was attempted without success. The majority of the fluoroscopy time was necessary for these attempts of attempted catheter position. Given our sense that we would not be able to  reach the occlusion for treatment, we elected to infuse 2 mg of intra arterial Integrilin into the superior division at this time. Adequate flush was then performed. We did make an attempt for a final catheter position after the Integrilin infusion. Ultimately, on our final attempt, the angled headway duo catheter and a J curve 012 headliner wire selected the affected branch with the J wire advancing just beyond the occlusion site. With the microcatheter in position in the proximal branch, the J wire would not pass distally. The J wire was then exchanged for the Aristotle wire, which we navigated into a distal cortical branch location. The microcatheter was then passed beyond the occlusion site. Wire was removed and aspiration blood was performed to confirm a luminal position. We then elected to attempt stent retriever thrombectomy. A 3 mm x 20 mm solitaire device was selected. A rotating hemostatic valve was then attached to the back end of the microcatheter, and a pressurized and heparinized saline bag was attached to the catheter. The device was inserted into the rotating hemostatic valve. Back flush was achieved at the rotating hemostatic valve, and then the device was gently advanced through the microcatheter to the distal end. The retriever was then unsheathed by withdrawing the  microcatheter under fluoroscopy. Once the retriever was completely unsheathed, the microcatheter was carefully stripped from the delivery device. Once the device was completely open, a control angiogram was performed from the balloon guide. This identified a small filling defect centered within the length of the solitaire device, with flow through the solitaire device into the distal cortical branches of the affected territory. A 3 minute time interval was observed before the device was withdrawn. After 3 minutes, the balloon at the balloon guide catheter was then inflated under fluoroscopy for proximal flow arrest. Constant aspiration  was then performed at the balloon guide, as the retriever was gently and slowly withdrawn with fluoroscopic observation. Aspiration continued until the device was removed from the system. The Touhy was removed from the back end of the balloon guide and the device was retrieved. Free aspiration was confirmed at the hub of the balloon guide catheter, with free blood return confirmed. The balloon was then deflated, and a control angiogram was performed. After this initial pass, the artery remained occluded. The patient's blood pressure spike to 0000000 systolic with withdrawal of the catheter. Given the physiologic response, the location of the clot and the difficulty navigating to this site after 1 hour fluoroscopy time, we elected to withdraw from the case, so as not to commit any complication. Balloon guide was then removed. The skin at the puncture site was then cleaned with Chlorhexidine. Given the absence of stock of a suitable sized Angio-Seal, a 7 French sheath was placed at the puncture site on the J wire. 2 minutes observation with manual pressure was observed. The 7 French sheath was then removed and an 75F angioseal was deployed. Flat panel CT was performed. Patient was extubated once the CT was reviewed. Patient tolerated the procedure well. No complications were encountered and no significant blood loss encountered. IMPRESSION: Status post ultrasound-guided access left common femoral artery for left-sided cervical and cerebral angiogram and attempt mechanical thrombectomy of left opercular branch (M3) of the dominant superior division, with persisting TICI 0 flow of the affected branch after 1 pass solitaire stentreiver. Angio-Seal for hemostasis. Signed, Dulcy Fanny. Dellia Nims, RPVI [facility] Radiology PLAN: The patient was extubated. ICU status Target systolic blood pressure of less than 180, status post tPA Left hip straight time 6 hours Frequent neurovascular  checks Repeat neurologic imaging with CT and/MRI at the discretion of neurology team Electronically Signed   By: Corrie Mckusick D.O.   On: 01/30/2021 10:10   CT HEAD CODE STROKE WO CONTRAST  Result Date: 01/30/2021 CLINICAL DATA:  Code stroke. Initial evaluation for acute aphasia, right-sided facial droop EXAM: CT HEAD WITHOUT CONTRAST TECHNIQUE: Contiguous axial images were obtained from the base of the skull through the vertex without intravenous contrast. COMPARISON:  None available. FINDINGS: Brain: Age-related cerebral atrophy with mild chronic small vessel ischemic disease. No acute intracranial hemorrhage. There is question of subtle hypodensity involving the left insula and overlying left frontal operculum, suspicious for possible early acute left MCA distribution infarct. No other visible acute large vessel territory infarct. No mass lesion, midline shift, or mass effect. No hydrocephalus or extra-axial fluid collection. Vascular: No visible hyperdense vessel. Calcified atherosclerosis present at skull base. Skull: Scalp soft tissues within normal limits.  Calvarium intact. Sinuses/Orbits: Globes and orbital soft tissues within normal limits. Patient status post bilateral ocular lens replacement. Paranasal sinuses are clear. No mastoid effusion. Other: None. ASPECTS [facility] Stroke Program Early CT Score) - Ganglionic level infarction (caudate, lentiform  nuclei, internal capsule, insula, M1-M3 cortex): 6 - Supraganglionic infarction (M4-M6 cortex): 2 Total score (0-10 with 10 being normal): 8 IMPRESSION: 1. 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 No intracranial hemorrhage. 2. ASPECTS is 8. 3. Age-related cerebral atrophy with mild chronic small vessel ischemic disease. These results were communicated to Dr. Lorrin Goodell At 3:57 amon 4/28/2022by text page via the [facility] messaging system. Electronically  Signed   By: Jeannine Boga M.D.   On: 01/30/2021 04:05   CT ANGIO HEAD CODE STROKE  Result Date: 01/30/2021 CLINICAL DATA:  Initial evaluation for acute stroke, aphasia, right-sided facial droop. EXAM: CT ANGIOGRAPHY HEAD AND NECK TECHNIQUE: Multidetector CT imaging of the head and neck was performed using the standard protocol during bolus administration of intravenous contrast. Multiplanar CT image reconstructions and MIPs were obtained to evaluate the vascular anatomy. Carotid stenosis measurements (when applicable) are obtained utilizing NASCET criteria, using the distal internal carotid diameter as the denominator. CONTRAST:  55mL OMNIPAQUE IOHEXOL 350 MG/ML SOLN COMPARISON:  Prior CT from earlier the same day. FINDINGS: CTA NECK FINDINGS Aortic arch: Visualized aortic arch normal in caliber with normal branch pattern. Moderate atheromatous change about the visualized arch and origin of the great vessels without hemodynamically significant stenosis. Right carotid system: Right CCA patent from its origin to the bifurcation without stenosis. Scattered mixed plaque about the right carotid bulb/proximal right ICA without significant stenosis. Right ICA patent distally without stenosis, dissection or occlusion. Left carotid system: Left CCA patent from its origin to the bifurcation without stenosis. Bulky calcified plaque about the left carotid bulb/proximal left ICA. Associated stenosis of up to 75% by NASCET criteria. Left ICA patent distally without stenosis, dissection or occlusion. Vertebral arteries: Both vertebral arteries arise from the subclavian arteries. No proximal subclavian artery stenosis. Right vertebral artery slightly dominant. Atheromatous plaque at the origin of the right vertebral artery without high-grade stenosis. Vertebral arteries otherwise widely patent within the neck without stenosis, dissection or occlusion. Skeleton: No acute osseous finding. No discrete or worrisome osseous  lesions. Mild cervical spondylosis without significant stenosis. Other neck: No other acute soft tissue abnormality within the neck. No mass or adenopathy. 1.3 cm sebaceous cyst noted at the left posterior upper neck. Upper chest: Visualized upper chest demonstrates no acute finding. Centrilobular emphysematous changes noted. 4 mm left upper lobe nodule noted (series 5, image 171), indeterminate. Review of the MIP images confirms the above findings CTA HEAD FINDINGS Anterior circulation: Petrous segments widely patent. Advanced atheromatous change throughout the carotid siphons bilaterally, right worse than left. Associated moderate multifocal stenosis about the cavernous left ICA. More moderate to severe narrowing present at the cavernous right ICA. A1 segments patent. Normal anterior communicating artery complex. Anterior cerebral arteries patent to their distal aspects without stenosis. No M1 stenosis or occlusion. Normal MCA bifurcations. On the left, there is abrupt occlusion of a proximal left M3 branch, superior division (series 7, image 98). No other visible proximal left MCA branch occlusion. Right MCA branches well perfused. Posterior circulation: Atheromatous irregularity within the V4 segments bilaterally without significant stenosis. Both PICA origins patent and normal. Basilar patent to its distal aspect without stenosis. Superior cerebellar arteries patent bilaterally. Right PCA supplied via the basilar. Fetal type origin of the left PCA. Both PCAs perfused to their distal aspects without stenosis. Venous sinuses: Grossly patent allowing for timing the contrast bolus. Anatomic variants: Fetal type origin of the left PCA. No visible  aneurysm. Review of the MIP images confirms the above findings IMPRESSION: 1. Positive CTA for emergent large vessel occlusion, with abrupt occlusion of a proximal left M3 branch, superior division. 2. Advanced atherosclerotic change throughout the carotid siphons with  associated moderate to severe multifocal stenoses, right worse than left. 3. 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 4. 4 mm left upper lobe nodule, indeterminate. No follow-up needed if patient is low-risk. Non-contrast chest CT can be considered in 12 months if patient is high-risk. This recommendation follows the consensus statement: Guidelines for Management of Incidental Pulmonary Nodules Detected on CT Images: From the Fleischner Society 2017; Radiology 2017; 284:228-243. 5. Aortic Atherosclerosis (ICD10-I70.0) and Emphysema (ICD10-J43.9). Critical Value/emergent results were called by telephone at the time of interpretation on 01/30/2021 at 4:00 am to provider [facility] , who verbally acknowledged these results. Electronically Signed   By: Jeannine Boga M.D.   On: 01/30/2021 04:32   CT ANGIO NECK CODE STROKE  Result Date: 01/30/2021 CLINICAL DATA:  Initial evaluation for acute stroke, aphasia, right-sided facial droop. EXAM: CT ANGIOGRAPHY HEAD AND NECK TECHNIQUE: Multidetector CT imaging of the head and neck was performed using the standard protocol during bolus administration of intravenous contrast. Multiplanar CT image reconstructions and MIPs were obtained to evaluate the vascular anatomy. Carotid stenosis measurements (when applicable) are obtained utilizing NASCET criteria, using the distal internal carotid diameter as the denominator. CONTRAST:  34mL OMNIPAQUE IOHEXOL 350 MG/ML SOLN COMPARISON:  Prior CT from earlier the same day. FINDINGS: CTA NECK FINDINGS Aortic arch: Visualized aortic arch normal in caliber with normal branch pattern. Moderate atheromatous change about the visualized arch and origin of the great vessels without hemodynamically significant stenosis. Right carotid system: Right CCA patent from its origin to the bifurcation without stenosis. Scattered mixed plaque about the right carotid bulb/proximal right ICA without significant  stenosis. Right ICA patent distally without stenosis, dissection or occlusion. Left carotid system: Left CCA patent from its origin to the bifurcation without stenosis. Bulky calcified plaque about the left carotid bulb/proximal left ICA. Associated stenosis of up to 75% by NASCET criteria. Left ICA patent distally without stenosis, dissection or occlusion. Vertebral arteries: Both vertebral arteries arise from the subclavian arteries. No proximal subclavian artery stenosis. Right vertebral artery slightly dominant. Atheromatous plaque at the origin of the right vertebral artery without high-grade stenosis. Vertebral arteries otherwise widely patent within the neck without stenosis, dissection or occlusion. Skeleton: No acute osseous finding. No discrete or worrisome osseous lesions. Mild cervical spondylosis without significant stenosis. Other neck: No other acute soft tissue abnormality within the neck. No mass or adenopathy. 1.3 cm sebaceous cyst noted at the left posterior upper neck. Upper chest: Visualized upper chest demonstrates no acute finding. Centrilobular emphysematous changes noted. 4 mm left upper lobe nodule noted (series 5, image 171), indeterminate. Review of the MIP images confirms the above findings CTA HEAD FINDINGS Anterior circulation: Petrous segments widely patent. Advanced atheromatous change throughout the carotid siphons bilaterally, right worse than left. Associated moderate multifocal stenosis about the cavernous left ICA. More moderate to severe narrowing present at the cavernous right ICA. A1 segments patent. Normal anterior communicating artery complex. Anterior cerebral arteries patent to their distal aspects without stenosis. No M1 stenosis or occlusion. Normal MCA bifurcations. On the left, there is abrupt occlusion of a proximal left M3 branch, superior division (series 7, image 98). No other visible proximal left MCA branch occlusion. Right MCA branches well perfused. Posterior  circulation: Atheromatous irregularity within the V4 segments bilaterally without significant stenosis. Both PICA origins patent and normal. Basilar patent to its distal aspect without stenosis. Superior cerebellar arteries patent bilaterally. Right PCA supplied via the basilar. Fetal type origin of the left PCA. Both PCAs perfused to their distal aspects without stenosis. Venous sinuses: Grossly patent allowing for timing the contrast bolus. Anatomic variants: Fetal type origin of the left PCA. No visible aneurysm. Review of the MIP images confirms the above findings IMPRESSION: 1. Positive CTA for emergent large vessel occlusion, with abrupt occlusion of a proximal left M3 branch, superior division. 2. 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 3. 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 4. 4 mm left upper lobe nodule, indeterminate. No follow-up needed if patient is low-risk. Non-contrast chest CT can be considered in 12 months if patient is high-risk. This recommendation follows the consensus statement: Guidelines for Management of Incidental Pulmonary Nodules Detected on CT Images: From the Fleischner Society 2017; Radiology 2017; 284:228-243. 5. Aortic Atherosclerosis (ICD10-I70.0) and Emphysema (ICD10-J43.9). Critical Value/emergent results were called by telephone at the time of interpretation on 01/30/2021 at 4:00 am to provider [facility] , who verbally acknowledged these results. Electronically Signed   By: Jeannine Boga M.D.   On: 01/30/2021 04:32    PHYSICAL EXAM General:  Elderly Caucasian male laying comfortably in bed; in no acute distress. HENT: Normal oropharynx and mucosa. Normal external appearance of ears and nose. Neck: Supple, no pain or tenderness CV: No JVD. No peripheral edema. Pulmonary: Symmetric Chest rise. Normal respiratory effort. Ext: No  cyanosis, edema, or deformity Skin: No rash. Normal palpation of skin.  Musculoskeletal: Normal digits and nails by inspection. No clubbing.  Neurologic Examination  Mental status/Cognition: Awake, attempts to speak but vocal sounds are garbled and patient is visibly frustrated.  Severe expressive and mild receptive aphasia unable to name, repeat.  Mild dysarthria.  Patient able to follow commands. Patient recognizes his wife and son, but is unable to say their names despite multiple attempts.  Cranial nerves:   CN II Pupils 22mm equal and reactive to light, no VF deficits   CN III,IV,VI EOM intact, no gaze preference or deviation, no nystagmus   CN V Symmetric sensation to LT    CN VII  moderate right lower facial weakness.   CN VIII normal hearing to speech   CN IX & X symmetrical palate elevation   CN XI Good symmetrical shoulder shrug   CN XII  tongue protrusion towards the R   Motor:  Muscle bulk: normal, tone normal At least 3/5 BUE, good grip bilaterally At least 3/5 in BLE, per IR needed to keep the R hi straight and remained in reverse [place] Great spontaneous movement of the extremities  Sensation: Intact to LT in all extremities.  Coordination/Complex Motor:  No obvious ataxia.   ASSESSMENT/PLAN [name] is a 75 y.o. male with history of HTN, HLD, prior MI, CAD, AAA, CHD, ICM who is currently admitted to CVICU for sustained Vtach with cardioversion to NSR and EP evaluation who had a CODE STROKE called after sudden status change with aphasia and R sided facial droop noted by RN. CT Head confirmed that the patient had a small L MCA infarct involving the L insula and frontal operculum. Patient received tPA. CTA Head noted an acute occlusion of the L MCA at M3 and  patient underwent thrombectomy. Patient continues to remain aphasic. Etiology appears somewhat cryptogenic as patient does have recent hx of V tach but EP is on board and has not noted any  Atrial arrhythmias. There is some concern that perhaps the recent LHC instigated a cardioembolus leading to CVA.  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 tach.  No definite history of atrial fibrillation documented  Code Stroke : CT head 1. 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 No intracranial hemorrhage. 2. ASPECTS is 8. 3. Age-related cerebral atrophy with mild chronic small vessel ischemic disease.  CTA head & neck  IMPRESSION: 1. Positive CTA for emergent large vessel occlusion, with abrupt occlusion of a proximal left M3 branch, superior division. 2. 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 3. 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 4. 4 mm left upper lobe nodule, indeterminate. No follow-up needed if patient is low-risk. Non-contrast chest CT can be considered in 12 months if patient is high-risk. This recommendation follows the consensus statement: Guidelines for Management of Incidental Pulmonary Nodules Detected on CT Images: From the Fleischner Society 2017; Radiology 2017; 284:228-243. 5. Aortic Atherosclerosis (ICD10-I70.0) and Emphysema (ICD10-J43.9).   MRI  vs repeat CT in AM    2D Echo recent echo, 4/26, LVEF 45-50% w/ LV golbal hypokinesis, mildly dilate LA, Trivial MVR, aortic dilatation,  Interartrial septum was not well visualized  LDL 64  HgbA1c 6.0  VTE prophylaxis - SCDs    Diet   Diet NPO time specified     No antithrombotic prior to admission, now on aspirin 81 mg daily.   Consider Eliquis 24h post thrombectomy patient cardiac hx concerning for A fib, EP is already  following patient.  Therapy recommendations:  PT/ OT/ SLP  Disposition:  Pending  Recent VTach - Continue amiodarone 400mg  BID PO, per EP  Hypertension  Home Carvedilol 12.5mg  BID, Lisinopril 20mg   . Permissive hypertension (OK if < 180/105) s/p thrombectomy but gradually normalize in 5-7 days . Long-term BP goal normotensive  Hyperlipidemia  Continue home Crestor 5mg   LDL 64, goal < 70  Continue statin at discharge    Other Stroke Risk Factors  Advanced Age >/= 65    Obesity, Body mass index is 31.89 kg/m., BMI >/= 30 associated with increased stroke risk, recommend weight loss, diet and exercise as appropriate   Coronary artery disease  Congestive heart failure    Hospital day # 2  Damita Dunnings, MD PGY-1 I have personally obtained history,examined this patient, reviewed notes, independently viewed imaging studies, participated in medical decision making and plan of care.ROS completed by me personally and pertinent positives fully documented  I have made any additions or clarifications directly to the above note. Agree with note above.  Patient presented with sudden onset of aphasia following recent cardioversion for V. tach and cardiac catheterization.  No definite atrial fibrillation documented but had left M2 occlusion and was treated with IV tPA but attempts for mechanical thrombectomy was unsuccessful.  Continue close neurological monitoring and strict control of blood pressure as per post tPA protocol.  Mobilize out of bed.  Check MRI scan of the brain later today.  Continue cardiac monitoring and aggressive risk factor modification.  Long discussion with patient, wife and son and answered questions. This patient is critically ill and at significant risk of neurological worsening, death and care requires constant monitoring of vital signs, hemodynamics,respiratory and cardiac monitoring, extensive review of multiple databases, frequent neurological assessment,  discussion with family, other specialists and medical decision making of high complexity.I have made any additions or clarifications directly to the above note.This critical care time does not reflect procedure time, or teaching time or supervisory time of PA/NP/Med Resident etc but could involve care discussion time.  I spent 30 minutes of neurocritical care time  in the care of  this patient.      Antony Contras, MD Medical Director [place] Pager: [phone] 01/30/2021 3:41 PM  To contact Stroke Continuity provider, please refer to [email or web address] After hours, contact General Neurology

## 2021-01-30 NOTE — Progress Notes (Signed)
Pt HOB elevated to 30 at about 1130, L groin site assessed frequently following position change and no sign of bleeding or hematoma.  Candy Sledge, RN

## 2021-01-30 NOTE — Consult Note (Signed)
NEUROLOGY CONSULTATION NOTE   Date of service: January 30, 2021 Patient Name: Garrett Hunter MRN:  [id number] DOB:  1946-10-03 Reason for consult: "Stroke code" _ _ _   _ __   _ __ _ _  __ __   _ __   __ _  History of Present Illness  Garrett Hunter is a 75 y.o. male with PMH significant for HTN, HLD, prior MI, CAD, AAA, CHD, ICM who is currently admitted to CVICU for sustained Vtach with cardioversion to NSR and EP evaluation.  At 0200 on 01/30/21, he was seen at his baseline, talking. At 0315, he was noted to be aphasic with R facial droop. CTH w/o contrast with no ICH, ASPECTs of 8 with concern for ischemic stroke in the L MCA territory. CT angio with L MCA M2 superior division occlusion.  No MI in the last couple of months per wife, no hx of cranial or spinal surgery, no hx of aortic dissection, no recent GI or GU bleed, not on AC, platelet > 100k, INR < 1.7, APTT is normal.  MRS: 0 NIHSS: 10 TPA: yes, given after discussion of 6% risk of ICH and 30% chance of improvement with his wife Garrett Hunter who consented on patient's behalf. Initial delay in tPA was due to elevated SBP to 190/110. Labetalol was given and repeat SBP was less than 170/89. tPA was started. Thrombectomy: yes, Dr. Loreta Ave discussed risks and benefits of thrombectomy with patient's wife over the phone and I witnessed the conversation.   ROS   Unable to obtain due to aphasia.  Past History   Past Medical History:  Diagnosis Date  . AAA (abdominal aortic aneurysm) (HCC)    Small noted during cardiac catheterization in 2011.  [name] Benign neoplasm of colon   . CHF (congestive heart failure) (HCC)    EF 45-50%  . Chronic airway obstruction, not elsewhere classified   . Coronary artery disease    Inferior MI in 1996 treated with TPA. Cardiac cath in 2011 at [facility] showed chronic occlusion of the proximal RCA and proximal left circumflex without obstructive disease in the LAD. Non-ST elevation myocardial infarction  in October 2013 while on vacation in [place]. Cardiac catheterization showed plaque rupture in the proximal ramus with thrombus. He had balloon angioplasty done. 01/28/21 cath   . Coronary atherosclerosis of native coronary artery   . Hepatitis A    teenager  . Hyperlipidemia    Intolerance to statins.  . Hypertension   . MI (myocardial infarction) (HCC)    x 3 - last one 2013   Past Surgical History:  Procedure Laterality Date  . ABDOMINAL AORTIC ANEURYSM REPAIR     11/16/2016  . ANGIOPLASTY    . CARDIAC CATHETERIZATION  2011  . CARDIAC CATHETERIZATION  2/14   [facility]; 95% lesion in the ramus intermedius.   [name] CATARACT EXTRACTION W/PHACO Left 12/19/2018   Procedure: CATARACT EXTRACTION PHACO AND INTRAOCULAR LENS PLACEMENT (IOC)  LEFT;  Surgeon: Nevada Crane, MD;  Location: [facility];  Service: Ophthalmology;  Laterality: Left;  . CATARACT EXTRACTION W/PHACO Right 03/06/2019   Procedure: CATARACT EXTRACTION PHACO AND INTRAOCULAR LENS PLACEMENT (IOC)RIGHT;  Surgeon: Nevada Crane, MD;  Location: [facility];  Service: Ophthalmology;  Laterality: Right;  . CORONARY ANGIOPLASTY  1996   [facility] x1  . CORONARY ANGIOPLASTY  2/14   Severe restenosis in the ostial ramus. Status post angioplasty and drug-eluting stent placement with a 2.5 x 18 mm Xience  drug-eluting stent  . ESOPHAGOGASTRODUODENOSCOPY N/A 08/03/2018   Procedure: ESOPHAGOGASTRODUODENOSCOPY (EGD);  Surgeon: Lin Landsman, MD;  Location: [facility];  Service: Gastroenterology;  Laterality: N/A;  . ESOPHAGOGASTRODUODENOSCOPY (EGD) WITH PROPOFOL N/A 11/03/2018   Procedure: ESOPHAGOGASTRODUODENOSCOPY (EGD) WITH PROPOFOL;  Surgeon: Lin Landsman, MD;  Location: [facility] ENDOSCOPY;  Service: Gastroenterology;  Laterality: N/A;  . ESOPHAGOGASTRODUODENOSCOPY (EGD) WITH PROPOFOL N/A 12/06/2018   Procedure: ESOPHAGOGASTRODUODENOSCOPY (EGD) WITH PROPOFOL;  Surgeon: Lucilla Lame, MD;  Location: [facility] ENDOSCOPY;   Service: Endoscopy;  Laterality: N/A;  . LEFT HEART CATH AND CORONARY ANGIOGRAPHY N/A 01/28/2021   Procedure: LEFT HEART CATH AND CORONARY ANGIOGRAPHY;  Surgeon: Nelva Bush, MD;  Location: [place] CV LAB;  Service: Cardiovascular;  Laterality: N/A;  . TONSILLECTOMY AND ADENOIDECTOMY     Family History  Problem Relation Age of Onset  . Cancer Mother 48       stomach  . Heart attack Father   . Hypertension Sister   . Hypertension Brother    Social History   Socioeconomic History  . Marital status: Married    Spouse name: Not on file  . Number of children: 4  . Years of education: Not on file  . Highest education level: Not on file  Occupational History  . Not on file  Tobacco Use  . Smoking status: Former Smoker    Packs/day: 2.00    Years: 40.00    Pack years: 80.00    Types: Cigarettes    Quit date: 2004    Years since quitting: 18.3  . Smokeless tobacco: Former Systems developer    Types: Secondary school teacher  . Vaping Use: Never used  Substance and Sexual Activity  . Alcohol use: Yes    Alcohol/week: 3.0 standard drinks    Types: 3 Cans of beer per week    Comment: occassionally  . Drug use: No  . Sexual activity: Yes  Other Topics Concern  . Not on file  Social History Narrative   Lives at home with wife, works   Investment banker, operational of Radio broadcast assistant Strain: Not on file  Food Insecurity: Not on file  Transportation Needs: Not on file  Physical Activity: Not on file  Stress: Not on file  Social Connections: Not on file   Allergies  Allergen Reactions  . Crestor [Rosuvastatin]     Cramps. (Hight dose of Crestor)  . Livalo [Pitavastatin]     myalgia  . Pravachol [Pravastatin Sodium]     Cramps  . Zocor [Simvastatin]     Cramps    Medications   Medications Prior to Admission  Medication Sig Dispense Refill Last Dose  . aspirin EC 81 MG tablet Take 81 mg by mouth daily. Swallow whole.   01/28/2021 at Unknown time  . calcium-vitamin D (OSCAL  WITH D) 500-200 MG-UNIT tablet Take 1 tablet by mouth daily with breakfast.   01/27/2021 at Unknown time  . carvedilol (COREG) 12.5 MG tablet Take 12.5 mg by mouth 2 (two) times daily with a meal.   01/27/2021 at 0900  . Coenzyme Q10 (COQ10 PO) Take by mouth.   01/27/2021 at Unknown time  . lisinopril-hydrochlorothiazide (ZESTORETIC) 20-12.5 MG tablet Take 2 tablets by mouth daily.   01/27/2021 at Unknown time  . Multiple Vitamin (MULTIVITAMIN WITH MINERALS) TABS tablet Take 1 tablet by mouth daily.   01/27/2021 at Unknown time  . rosuvastatin (CRESTOR) 5 MG tablet Take 5 mg by mouth daily.   Past Week at Unknown time  .  vitamin C (ASCORBIC ACID) 500 MG tablet Take 500 mg by mouth daily.   01/27/2021 at Unknown time     Vitals   Vitals:   01/30/21 0100 01/30/21 0200 01/30/21 0300 01/30/21 0315  BP: (!) 148/77 (!) 157/79 (!) 157/144 (!) 190/110  Pulse: 64 63 70 71  Resp:      Temp:      TempSrc:      SpO2: 94% 95% 94% 95%  Weight:         Body mass index is 31.89 kg/m.  Physical Exam   General: Laying comfortably in bed; in no acute distress. HENT: Normal oropharynx and mucosa. Normal external appearance of ears and nose. Neck: Supple, no pain or tenderness CV: No JVD. No peripheral edema. Pulmonary: Symmetric Chest rise. Normal respiratory effort. Abdomen: Soft to touch, non-tender. Ext: No cyanosis, edema, or deformity Skin: No rash. Normal palpation of skin.  Musculoskeletal: Normal digits and nails by inspection. No clubbing.  Neurologic Examination  Mental status/Cognition: Awake, mute.  Cranial nerves:   CN II Pupils equal and reactive to light, no VF deficits   CN III,IV,VI EOM intact, no gaze preference or deviation, no nystagmus   CN V    CN VII Flattening of R nasolabial fold.   CN VIII normal hearing to speech   CN IX & X    CN XI    CN XII midline tongue protrusion   Motor:  Muscle bulk: normal, tone normal  Unable to do full strength testing due to global  aphasia but concern for mild RUE weakness.  Sensation: Withdraws to pain in all extremities  Coordination/Complex Motor:  No obvious ataxia.  Labs   CBC:  Recent Labs  Lab 01/28/21 0431 01/28/21 1643  WBC 13.1* 14.0*  HGB 14.0 13.7  HCT 41.7 40.6  MCV 98.1 98.1  PLT 166 295    Basic Metabolic Panel:  Lab Results  Component Value Date   NA 138 01/30/2021   K 4.2 01/30/2021   CO2 25 01/30/2021   GLUCOSE 87 01/30/2021   BUN 16 01/30/2021   CREATININE 1.13 01/30/2021   CALCIUM 9.2 01/30/2021   GFRNONAA >60 01/30/2021   GFRAA >60 08/03/2018   Lipid Panel:  Lab Results  Component Value Date   LDLCALC 61 01/28/2021   HgbA1c: No results found for: HGBA1C Urine Drug Screen: No results found for: LABOPIA, COCAINSCRNUR, LABBENZ, AMPHETMU, THCU, LABBARB  Alcohol Level No results found for: Belt  CT Head without contrast: Left insula hypodensity concerning for a L MCA stroke. ASPECTs of 8.  CT angio Head and Neck with contrast: L MCA M2 occlusion.  MRI Brain pending  Impression   THORNE WIRZ is a 75 y.o. male admitted with Vtach pending EP evaluation who has new L MCA stroke, s/p tPA and going to thrombectomy.  Recommendations  Plan: - Frequent NeuroChecks for post tPA care per stroke unit protocol: - Initial CTH demonstrated no acute hemorrhage or mass - MRI Brain - pending - CTA - L MCA M2 occlusion. - TTE - EF 45-50% - Lipid Panel: LDL - 61 - HbA1c: pending. - Antithrombotic: Start ASA 81 mg daily if 24 h CTH does not show acute hemorrhage - DVT prophylaxis: SCDs. Pharmacologic prophylaxis if 24 h CTH does not demonstrate acute hemorrhage - Systolic Blood Pressure goal: < 180 mm Hg and DBP < 105. - Telemetry monitoring for arrhythmia: 72 hours - Swallow screen - ordered - PT/OT/SLP consults - Clevidipine as needed for  SBP > 180. - Patient is going for thrombectomy. For the first 24 hours, SBP goal per Neuro IR after  thrombectomy.   ______________________________________________________________________  This patient is critically ill and at significant risk of neurological worsening, death and care requires constant monitoring of vital signs, hemodynamics,respiratory and cardiac monitoring, neurological assessment, discussion with family, other specialists and medical decision making of high complexity. I spent 60 minutes of neurocritical care time  in the care of  this patient. This was time spent independent of any time provided by nurse practitioner or PA.  Donnetta Simpers [facility] Pager Number [phone] 01/30/2021  4:48 AM   Thank you for the opportunity to take part in the care of this patient. If you have any further questions, please contact the neurology consultation attending.  Signed,  [place] Pager Number [phone] _ _ _   _ __   _ __ _ _  __ __   _ __   __ _

## 2021-01-30 NOTE — Progress Notes (Signed)
NeuroInterventional Radiology  Pre-Procedure Note  History: 75 y.o.male currently admitted in [place], for sustained Vtach with cardioversion to NSR and EP evaluation.  PMH significant for HTN, HLD, prior MI, CAD, AAA, CHD,   At 19 on 01/30/21, he was seen at his baseline, talking. At 0315, he was noted to be aphasic with R facial droop. CTH w/o contrast with no ICH, ASPECTs of 8 with concern for ischemic stroke in the L MCA territory. CT angio with L MCA M2 superior division occlusion.  Baseline mRS: 0 NIHSS:  10  CT ASPECTS: 8 CTA:   L MCA M2 division CTP:   Not performed   I have discussed the case with Dr. Lorrin Goodell of Stroke Neurology.  Given the patient's symptoms, imaging findings, baseline function, we agree they are an appropriate candidate for attempt for mechanical thrombectomy.    The risks and benefits of the procedure were discussed with the patient/patient's family, with specific risks including: bleeding, infection, arterial injury/dissection, contrast reaction, kidney injury, need for further procedure/surgery, neurologic deficit, 10-15% risk of intracranial hemorrhage, cardiopulmonary collapse, death. All questions were answered.  The patient/family would like to proceed with attempt at thrombectomy.      Plan for cerebral angiogram and attempt at mechanical thrombectomy.   Signed,   Dulcy Fanny. Earleen Newport, DO

## 2021-01-30 NOTE — Significant Event (Addendum)
Paged for stroke code. Patient LKN 0200 and found to have severe R sided facial droop and RUE pronator drift on exam with significant aphasia. BG (130), HTN. Transferred to CT scan and tPA given following BP tx. Patient admitted for VT and not on any tx dose AC. ASA 81 mg PO daily (LD 04/27 at 0948), ppx heparin 5000 units q8h (LD 04/27 at 1319). Neurology called and consented patient for tPA, following no acute bleed on CT tPA given. No immediate resolution of sx. CTA with L M2 occlusion and focal clot. IR activated for thrombectomy.

## 2021-01-30 NOTE — Anesthesia Procedure Notes (Signed)
Arterial Line Insertion Start/End4/28/2022 4:35 AM, 01/30/2021 4:40 AM Performed by: Jearld Pies, CRNA, CRNA  Patient location: OOR procedure area. Emergency situation Left, radial was placed Catheter size: 20 G Hand hygiene performed  and Seldinger technique used Allen's test indicative of satisfactory collateral circulation Attempts: 1 Procedure performed without using ultrasound guided technique. Following insertion, dressing applied and Biopatch. Post procedure assessment: normal  Patient tolerated the procedure well with no immediate complications.

## 2021-01-30 NOTE — Anesthesia Preprocedure Evaluation (Signed)
Anesthesia Evaluation  Patient identified by MRN, date of birth, ID bandGeneral Assessment Comment:Awake, non-verbal  Airway Mallampati: II   Neck ROM: full    Dental   Pulmonary COPD, former smoker,    breath sounds clear to auscultation       Cardiovascular hypertension, + CAD, + Past MI and +CHF   Rhythm:regular Rate:Normal     Neuro/Psych CVA    GI/Hepatic PUD,   Endo/Other  diabetes  Renal/GU      Musculoskeletal   Abdominal   Peds  Hematology   Anesthesia Other Findings   Reproductive/Obstetrics                             Anesthesia Physical Anesthesia Plan  ASA: III and emergent  Anesthesia Plan: General   Post-op Pain Management:    Induction: Intravenous  PONV Risk Score and Plan: 2 and Ondansetron and Treatment may vary due to age or medical condition  Airway Management Planned: Oral ETT  Additional Equipment: Arterial line  Intra-op Plan:   Post-operative Plan: Possible Post-op intubation/ventilation  Informed Consent: I have reviewed the patients History and Physical, chart, labs and discussed the procedure including the risks, benefits and alternatives for the proposed anesthesia with the patient or authorized representative who has indicated his/her understanding and acceptance.       Plan Discussed with: CRNA, Anesthesiologist and Surgeon  Anesthesia Plan Comments:         Anesthesia Quick Evaluation

## 2021-01-30 NOTE — Anesthesia Procedure Notes (Signed)
Procedure Name: Intubation Date/Time: 01/30/2021 4:30 AM Performed by: Jearld Pies, CRNA Pre-anesthesia Checklist: Patient identified, Emergency Drugs available, Suction available and Patient being monitored Patient Re-evaluated:Patient Re-evaluated prior to induction Oxygen Delivery Method: Circle System Utilized Preoxygenation: Pre-oxygenation with 100% oxygen Induction Type: IV induction, Rapid sequence and Cricoid Pressure applied Laryngoscope Size: Mac and 4 Grade View: Grade II Tube type: Oral Tube size: 7.5 mm Number of attempts: 1 Airway Equipment and Method: Stylet Placement Confirmation: ETT inserted through vocal cords under direct vision,  positive ETCO2 and breath sounds checked- equal and bilateral Secured at: 23 cm Tube secured with: Tape Dental Injury: Teeth and Oropharynx as per pre-operative assessment

## 2021-01-30 NOTE — Progress Notes (Signed)
Pharmacist Code Stroke Response  Notified to mix tPA at 0346 by Dr. Lorrin Goodell Delivered tPA to RN at 0350  tPA dose = 9mg  bolus over 1 minute followed by 81mg  for a total dose of 90mg  over 1 hour  Issues/delays encountered (if applicable): Required labetalol for hypertension and CTA begin completed  Sherlon Handing, PharmD, BCPS Please see amion for complete clinical pharmacist phone list 01/30/21 4:10 AM

## 2021-01-30 NOTE — Progress Notes (Signed)
OT Cancellation Note  Patient Details Name: Garrett Hunter MRN: [id number] DOB: 1945/12/16   Cancelled Treatment:    Reason Eval/Treat Not Completed: Active bedrest order  [place], OT/L   Acute OT Clinical Specialist Acute Rehabilitation Services Pager [phone] Office [phone]  01/30/2021, 7:31 AM

## 2021-01-30 NOTE — Sedation Documentation (Signed)
Noted right radial with clear dressing actively bleeding. Pressure dressing applied.

## 2021-01-30 NOTE — Anesthesia Postprocedure Evaluation (Signed)
Anesthesia Post Note  Patient: Garrett Hunter  Procedure(s) Performed: IR WITH ANESTHESIA (N/A )     Patient location during evaluation: ICU Anesthesia Type: General Level of consciousness: awake and alert Pain management: pain level controlled Vital Signs Assessment: post-procedure vital signs reviewed and stable Respiratory status: spontaneous breathing, nonlabored ventilation, respiratory function stable and patient connected to nasal cannula oxygen Cardiovascular status: blood pressure returned to baseline and stable Postop Assessment: no apparent nausea or vomiting Anesthetic complications: no   No complications documented.  Last Vitals:  Vitals:   01/30/21 0409 01/30/21 0415  BP: (!) 172/83 (!) 168/82  Pulse: 68 65  Resp: 18 20  Temp:    SpO2: 97% 97%    Last Pain:  Vitals:   01/30/21 0000  TempSrc:   PainSc: 0-No pain                 Malcomb Gangemi S

## 2021-01-30 NOTE — Progress Notes (Signed)
PT Cancellation Note  Patient Details Name: Garrett Hunter MRN: [id number] DOB: 1946-07-17   Cancelled Treatment:    Reason Eval/Treat Not Completed: Active bedrest order   Jesus Poplin B Iori Gigante 01/30/2021, 7:10 AM Bayard Males, PT Acute Rehabilitation Services Pager: [phone] Office: [phone]

## 2021-01-30 NOTE — Procedures (Signed)
Neuro-Interventional Radiology  Post Cerebral Angiogram Procedure Note  Operator:    Dr. Earleen Newport Assistant:   None  History:   75 y.o. male currently admitted in [place], for sustained Vtach with cardioversion to NSR and EP evaluation.  PMH significant for HTN, HLD, prior MI, CAD, AAA, CHD,   At 28 on 01/30/21, he was seen at his baseline, talking. At 0315, he was noted to be aphasic with R facial droop. CTH w/o contrast with no ICH, ASPECTs of 8 with concern for ischemic stroke in the L MCA territory. CT angio with L MCA M2 superior division occlusion.  Baseline mRS:  0 NIHSS:   10 Site of occlusion:  M3/opercular superior branch of left MCA   Procedure: US guided left CFA access Cervical & Cerebral Angiogram Mechanical Thrombectomy Deployment of Angioseal Flat Panel CT in NIR  First Pass Device:  5mm solitaire  Result   TICI 0  Findings:   The branch occlusion is opercular/M3.  Severe tortuosity of vasculature, contributing to the delay in achieving first pass.  We elected to only try once after the first pass, given the complicating features of his vasculature to avoid injury.   Anesthesia:   GETA  EBL:    161WR     Complication:  None   Medication: IV tPA administered?: yes IA Medication:  4mg  Integrilin at occluded artery  Recommendations: - right hip straight x 4 hours, frequent checks, left CFA access site - Goal SBP per post tPA orders, less than 180 SBP  - Frequent NV checks - Repeat CT or MRI imaging recommended within 36 hours, discretion of Neurology - NIR to follow   Signed,  Dulcy Fanny. Earleen Newport, DO

## 2021-01-30 NOTE — Progress Notes (Signed)
Verbal order from Dr. Lorrin Goodell to hold on CT this AM since patient is getting MRI. :)

## 2021-01-30 NOTE — Transfer of Care (Signed)
Immediate Anesthesia Transfer of Care Note  Patient: Garrett Hunter  Procedure(s) Performed: IR WITH ANESTHESIA (N/A )  Patient Location: ICU  Anesthesia Type:General  Level of Consciousness: awake and patient cooperative  Airway & Oxygen Therapy: Patient Spontanous Breathing and Patient connected to face mask oxygen  Post-op Assessment: Report given to RN and Post -op Vital signs reviewed and stable  Post vital signs: Reviewed  Last Vitals:  Vitals Value Taken Time  BP 152/76 01/30/21 0730  Temp    Pulse 80 01/30/21 0733  Resp 17 01/30/21 0733  SpO2 95 % 01/30/21 0733  Vitals shown include unvalidated device data.  Last Pain:  Vitals:   01/30/21 0000  TempSrc:   PainSc: 0-No pain         Complications: No complications documented.

## 2021-01-30 NOTE — Progress Notes (Signed)
Patient to 4N32 at 0722, groin site assessed with IR RN at bedside. Level 1 with small amount of oozing o/a, pulses all dopplerable although LLE DP very difficult to locate. Patient alert and oriented to self, at patient request will call wife Garrett Hunter.

## 2021-01-30 NOTE — Progress Notes (Addendum)
Progress Note  Patient Name: Garrett Hunter Date of Encounter: 01/30/2021  [facility] Cardiologist: [name]  Subjective   Denies pain anywhere, no SOB, has a slight cough  Inpatient Medications    Scheduled Meds:   stroke: mapping our early stages of recovery book   Does not apply Once   amiodarone  400 mg Oral BID   aspirin EC  81 mg Oral Daily   carvedilol  12.5 mg Oral BID WC   Chlorhexidine Gluconate Cloth  6 each Topical Daily   eptifibatide       hydrochlorothiazide  12.5 mg Oral Daily   lisinopril  20 mg Oral Daily   pantoprazole (PROTONIX) IV  40 mg Intravenous QHS   rosuvastatin  5 mg Oral Daily   sodium chloride flush  3 mL Intravenous Q12H   Continuous Infusions:  sodium chloride     sodium chloride     sodium chloride     clevidipine     PRN Meds: sodium chloride, acetaminophen, docusate sodium, hydrALAZINE, labetalol, ondansetron (ZOFRAN) IV, sodium chloride flush   Vital Signs    Vitals:   01/30/21 0356 01/30/21 0400 01/30/21 0409 01/30/21 0415  BP: (!) 174/86 (!) 177/82 (!) 172/83 (!) 168/82  Pulse: 65 68 68 65  Resp: 18 20 18 20   Temp:      TempSrc:      SpO2: 97% 98% 97% 97%  Weight:        Intake/Output Summary (Last 24 hours) at 01/30/2021 0840 Last data filed at 01/30/2021 0704 Gross per 24 hour  Intake 1339.08 ml  Output 1845 ml  Net -505.92 ml   Last 3 Weights 01/29/2021 01/28/2021 01/28/2021  Weight (lbs) 228 lb 9.9 oz 228 lb 6.3 oz 228 lb 6.3 oz  Weight (kg) 103.7 kg 103.6 kg 103.6 kg  Some encounter information is confidential and restricted. Go to Review Flowsheets activity to see all data.      Telemetry    SR 80's, occ single PVCs, had one NSVT yesterday 11:40AM, none since - Personally Reviewed  ECG    No new EKGs - Personally Reviewed  Physical Exam   GEN: No acute distress.   Neck: No JVD Cardiac: RRR, no murmurs, rubs, or gallops.  Respiratory: CTA b/l. GI: Soft, nontender, non-distended  MS: No edema;  No deformity. Neuro:  L facial droop, expressive aphasia, follows commands appropriately, answers yes/no appropriately Psych: Normal affect   Labs    High Sensitivity Troponin:   Recent Labs  Lab 01/28/21 0031 01/28/21 0255 01/28/21 0431 01/28/21 0640 01/28/21 0921  TROPONINIHS 652* 1,272* 1,509* 1,844* 2,162*      Chemistry Recent Labs  Lab 01/27/21 1754 01/28/21 0431 01/28/21 1643 01/28/21 2345 01/30/21 0046  NA 138 138  --  138 138  K 4.3 3.7  --  3.5 4.2  CL 101 100  --  104 103  CO2 25 26  --  27 25  GLUCOSE 173* 120*  --  98 87  BUN 23 26*  --  20 16  CREATININE 1.39* 1.39* 1.40* 1.18 1.13  CALCIUM 9.9 9.3  --  8.9 9.2  PROT 7.1  --   --   --   --   ALBUMIN 4.5  --   --   --   --   AST 32  --   --   --   --   ALT 32  --   --   --   --  ALKPHOS 76  --   --   --   --   BILITOT 0.7  --   --   --   --   GFRNONAA 54* 54* 53* >60 >60  ANIONGAP 12 12  --  7 10     Hematology Recent Labs  Lab 01/27/21 1754 01/28/21 0431 01/28/21 1643  WBC 11.4* 13.1* 14.0*  RBC 4.49 4.25 4.14*  HGB 14.8 14.0 13.7  HCT 44.2 41.7 40.6  MCV 98.4 98.1 98.1  MCH 33.0 32.9 33.1  MCHC 33.5 33.6 33.7  RDW 13.0 13.2 13.1  PLT 213 166 172    BNPNo results for input(s): BNP, PROBNP in the last 168 hours.   DDimer No results for input(s): DDIMER in the last 168 hours.   Radiology    CT HEAD CODE STROKE WO CONTRAST Result Date: 01/30/2021 CLINICAL DATA:  Code stroke. Initial evaluation for acute aphasia, right-sided facial droop EXAM: CT HEAD WITHOUT CONTRAST TECHNIQUE: Contiguous axial images were obtained from the base of the skull through the vertex without intravenous contrast. COMPARISON:  None available. FINDINGS: Brain: Age-related cerebral atrophy with mild chronic small vessel ischemic disease. No acute intracranial hemorrhage. There is question of subtle hypodensity involving the left insula and overlying left frontal operculum, suspicious for possible early acute left  MCA distribution infarct. No other visible acute large vessel territory infarct. No mass lesion, midline shift, or mass effect. No hydrocephalus or extra-axial fluid collection. Vascular: No visible hyperdense vessel. Calcified atherosclerosis present at skull base. Skull: Scalp soft tissues within normal limits.  Calvarium intact. Sinuses/Orbits: Globes and orbital soft tissues within normal limits. Patient status post bilateral ocular lens replacement. Paranasal sinuses are clear. No mastoid effusion. Other: None. ASPECTS [facility] Stroke Program Early CT Score) - Ganglionic level infarction (caudate, lentiform nuclei, internal capsule, insula, M1-M3 cortex): 6 - Supraganglionic infarction (M4-M6 cortex): 2 Total score (0-10 with 10 being normal): 8 IMPRESSION: 1. 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 No intracranial hemorrhage. 2. ASPECTS is 8. 3. Age-related cerebral atrophy with mild chronic small vessel ischemic disease. These results were communicated to Dr. Lorrin Goodell At 3:57 amon 4/28/2022by text page via the [facility] messaging system. Electronically Signed   By: Jeannine Boga M.D.   On: 01/30/2021 04:05    CT ANGIO HEAD CODE STROKE Result Date: 01/30/2021 CLINICAL DATA:  Initial evaluation for acute stroke, aphasia, right-sided facial droop. EXAM: CT ANGIOGRAPHY HEAD AND NECK TECHNIQUE: Multidetector CT imaging of the head and neck was performed using the Hunter protocol during bolus administration of intravenous contrast. Multiplanar CT image reconstructions and MIPs were obtained to evaluate the vascular anatomy. Carotid stenosis measurements (when applicable) are obtained utilizing NASCET criteria, using the distal internal carotid diameter as the denominator. CONTRAST:  46mL OMNIPAQUE IOHEXOL 350 MG/ML SOLN COMPARISON:  Prior CT from earlier the same day. FINDINGS: CTA NECK FINDINGS Aortic  arch: Visualized aortic arch normal in caliber with normal branch pattern. Moderate atheromatous change about the visualized arch and origin of the great vessels without hemodynamically significant stenosis. Right carotid system: Right CCA patent from its origin to the bifurcation without stenosis. Scattered mixed plaque about the right carotid bulb/proximal right ICA without significant stenosis. Right ICA patent distally without stenosis, dissection or occlusion. Left carotid system: Left CCA patent from its origin to the bifurcation without stenosis. Bulky calcified plaque about the left carotid bulb/proximal left ICA. Associated stenosis of up to 75%  by NASCET criteria. Left ICA patent distally without stenosis, dissection or occlusion. Vertebral arteries: Both vertebral arteries arise from the subclavian arteries. No proximal subclavian artery stenosis. Right vertebral artery slightly dominant. Atheromatous plaque at the origin of the right vertebral artery without high-grade stenosis. Vertebral arteries otherwise widely patent within the neck without stenosis, dissection or occlusion. Skeleton: No acute osseous finding. No discrete or worrisome osseous lesions. Mild cervical spondylosis without significant stenosis. Other neck: No other acute soft tissue abnormality within the neck. No mass or adenopathy. 1.3 cm sebaceous cyst noted at the left posterior upper neck. Upper chest: Visualized upper chest demonstrates no acute finding. Centrilobular emphysematous changes noted. 4 mm left upper lobe nodule noted (series 5, image 171), indeterminate. Review of the MIP images confirms the above findings CTA HEAD FINDINGS Anterior circulation: Petrous segments widely patent. Advanced atheromatous change throughout the carotid siphons bilaterally, right worse than left. Associated moderate multifocal stenosis about the cavernous left ICA. More moderate to severe narrowing present at the cavernous right ICA. A1 segments  patent. Normal anterior communicating artery complex. Anterior cerebral arteries patent to their distal aspects without stenosis. No M1 stenosis or occlusion. Normal MCA bifurcations. On the left, there is abrupt occlusion of a proximal left M3 branch, superior division (series 7, image 98). No other visible proximal left MCA branch occlusion. Right MCA branches well perfused. Posterior circulation: Atheromatous irregularity within the V4 segments bilaterally without significant stenosis. Both PICA origins patent and normal. Basilar patent to its distal aspect without stenosis. Superior cerebellar arteries patent bilaterally. Right PCA supplied via the basilar. Fetal type origin of the left PCA. Both PCAs perfused to their distal aspects without stenosis. Venous sinuses: Grossly patent allowing for timing the contrast bolus. Anatomic variants: Fetal type origin of the left PCA. No visible aneurysm. Review of the MIP images confirms the above findings IMPRESSION: 1. Positive CTA for emergent large vessel occlusion, with abrupt occlusion of a proximal left M3 branch, superior division. 2. 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 3. 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 4. 4 mm left upper lobe nodule, indeterminate. No follow-up needed if patient is low-risk. Non-contrast chest CT can be considered in 12 months if patient is high-risk. This recommendation follows the consensus statement: Guidelines for Management of Incidental Pulmonary Nodules Detected on CT Images: From the Fleischner Society 2017; Radiology 2017; 284:228-243. 5. Aortic Atherosclerosis (ICD10-I70.0) and Emphysema (ICD10-J43.9). Critical Value/emergent results were called by telephone at the time of interpretation on 01/30/2021 at 4:00 am to provider [facility] , who verbally acknowledged these results.  Electronically Signed   By: Jeannine Boga M.D.   On: 01/30/2021 04:32     Cardiac Studies   01/28/21: LHC Conclusions: Severe two-vessel coronary artery disease with chronic total occlusions of the proximal LCx and RCA.  There are minimal luminal irregularities in the LAD without significant stenosis. Widely patent ramus intermedius stent. Moderately elevated left ventricular filling pressure.   Recommendations: Continue IV amiodarone and ICU monitoring in the setting of sustained ventricular tachycardia that is likely scar mediated. Gentle diuresis. Aggressive secondary prevention of coronary artery disease. EP consultation for further management of VT.       01/28/21: TTE IMPRESSIONS   1. Left ventricular ejection fraction, by estimation, is 45 to 50%. The  left ventricle has mildly decreased function. The left ventricle  demonstrates global hypokinesis. There is mild  left ventricular  hypertrophy. Left ventricular diastolic parameters  are consistent with Grade II diastolic dysfunction (pseudonormalization).  There is akinesis of the left ventricular, basal inferior segment.   2. Right ventricular systolic function is normal. The right ventricular  size is normal.   3. Left atrial size was mildly dilated.   4. The mitral valve is grossly normal. Trivial mitral valve  regurgitation. No evidence of mitral stenosis.   5. The aortic valve was not well visualized. Aortic valve regurgitation  is not visualized. No aortic stenosis is present.   6. Aortic dilatation noted. There is mild dilatation of the aortic root,  measuring 39 mm.   Patient Profile     75 y.o. male with a hx of CAD (dating back to 1996, last PCI was 2014), AAA s/p endovascular repair in 2018, HTN, HLD, ICM  He was in his USOH day of his admission to [facility], he had worked in the yard and when getting ready to shower and sudden onset of palpitations and some degree of chest discomfort unlike his anginal  symptoms historically. He showered and ultimately became more symptomatic, weak, and called 911, found in WCT, given adenosine and amio with rhythm change. He was seen in the ER by [name] and cardioverted to SR, started on heparin gtt and amiodarone gtt   He had echo with LVEF 45-50%, grade II DD, and cath 01/28/21 with CTA of RCA and LCx, patent ramus stent and minimal luminal irregularities of the LAD, mod elevated LVEDP planned for gentle diuresis and transfer to [facility] for further management and EP evaluation.  Arrived to [facility] 01/28/21  Assessment & Plan    1.VT, symptomatic with progressive weakness and in the ER underwent cardioversion   LHC with severe CAD (CTO RCA and LCx), LVEF 45-50% Likely scar mediated. Home was on coreg 12.5mg  BID I do not see hx of arrhythmias or AAD in the past  EP saw in consult yesterday, with no further VT amiodarone gtt transitioned to PO Planned to treat with AAD, no ICD at this juncture     2. CAD     Severe but stable     HS Trop felt to be demand 2/2 VT     No recurrent symptoms  3. HTN     Home dose coreg resumed yesterday  4. ICM     Mild reduction in LVEF 45-50%     Not feltto be volume OL  5. Acute stroke this morning, aphasic with R facial droop     new L MCA stroke     s/p tPA brought to IR       4mg  Integrilin at occluded artery, Severe tortuosity of vasculature, contributing to the delay in achieving first pass.  We elected to only try once after the first pass, given the complicating features of his vasculature to avoid injury  No atrial arrhythmias by history or on telemetry ? Etiology, LHC? 01/28/21   Neurology team has assumed attending, we Garrett Hunter continue to follow along   For questions or updates, please contact [place] Please consult [email or web address] for contact info under        Signed, Garrett Jamaica, PA-C  01/30/2021, 8:40 AM    I have seen and examined this patient with Garrett Hunter.  Agree with above, note  added to reflect my findings.  On exam, RRR, no murmurs.  Patient remains in sinus rhythm with 1 short run of nonsustained ventricular tachycardia.  His amiodarone dose was  decreased to 400 mg twice daily.  Unfortunately overnight last night he had a CVA and received tPA with mechanical thrombectomy.  He remains aphasic with a facial droop.  Would continue amiodarone 400 mg twice daily for now.  He Garrett Hunter need this for the next 2 weeks, followed by 400 mg daily for 2 weeks followed by 200 mg a day.  Garrett Hunter M. Giankarlo Leamer MD 01/30/2021 12:26 PM

## 2021-01-30 NOTE — Code Documentation (Signed)
Stroke Response Nurse Documentation Code Documentation  Garrett Hunter is a 75 y.o. male at [place]. [facility] admitted originally on 01/27/21 for sustained VT with past medical hx of CAD, MI, HTN, HLD, CHF . On 4/28 Code stroke was activated by nursing staff. Patient was LKW at 0200 and now complaining of Right facial droop, right arm weakness and aphasia. On aspirin 81 mg daily and SQ prophylactic Heparin . Stroke team at the bedside on patient arrival. Labs drawn and patient cleared for CT by Dr. Alease Medina. Patient to CT with team. NIHSS 10, see documentation for details and code stroke times. Patient with disoriented, right facial droop, right arm weakness, Expressive aphasia  and dysarthria  on exam. The following imaging was completed:  CTA head and neck. Patient is a candidate for tPA due to fixed neurological deficit. TPA administration briefly delayed due to controlling HTN. Telephone consent obtained from his wife [name] for TPA and Endovasular Revascularization by Dr. Jacqualyn Posey. Bedside handoff with IR RN Precious Bard.    Madelynn Done  Rapid Response RN

## 2021-01-30 NOTE — Evaluation (Signed)
Speech Language Pathology Evaluation Patient Details Name: Garrett Hunter MRN: [id number] DOB: Dec 12, 1945 Today's Date: 01/30/2021 Time: 1062-6948 SLP Time Calculation (min) (ACUTE ONLY): 20 min  Problem List:  Patient Active Problem List   Diagnosis Date Noted  . Cerebral thrombosis with cerebral infarction 01/30/2021  . Cerebral embolism with cerebral infarction 01/30/2021  . Sustained ventricular tachycardia ([place]) 01/28/2021  . AKI (acute kidney injury) ([place])   . Sustained VT (ventricular tachycardia) ([place]) 01/27/2021  . Peptic ulcer disease   . Duodenal ulcer disease   . GIB (gastrointestinal bleeding) 08/02/2018  . Diabetes mellitus without complication ([place]) 54/62/7035  . Erectile dysfunction 07/01/2015  . Primary hypertension 08/09/2014  . Pure hypercholesterolemia 08/09/2014  . Chronic systolic heart failure ([place]) 08/04/2012  . Coronary artery disease   . Hyperlipidemia   . AAA (abdominal aortic aneurysm) ([place])   . NSTEMI (non-ST elevated myocardial infarction) ([place]) 07/09/2012   Past Medical History:  Past Medical History:  Diagnosis Date  . AAA (abdominal aortic aneurysm) ([place])    Small noted during cardiac catheterization in 2011.  [name] Benign neoplasm of colon   . CHF (congestive heart failure) ([facility])    EF 45-50%  . Chronic airway obstruction, not elsewhere classified   . Coronary artery disease    Inferior MI in 1996 treated with TPA. Cardiac cath in 2011 at [facility] showed chronic occlusion of the proximal RCA and proximal left circumflex without obstructive disease in the LAD. Non-ST elevation myocardial infarction in October 2013 while on vacation in [place]. Cardiac catheterization showed plaque rupture in the proximal ramus with thrombus. He had balloon angioplasty done. 01/28/21 cath   . Coronary atherosclerosis of native coronary artery   . Hepatitis A    teenager  . Hyperlipidemia    Intolerance to statins.  . Hypertension   . MI (myocardial infarction)  ([place])    x 3 - last one 2013   Past Surgical History:  Past Surgical History:  Procedure Laterality Date  . ABDOMINAL AORTIC ANEURYSM REPAIR     11/16/2016  . ANGIOPLASTY    . CARDIAC CATHETERIZATION  2011  . CARDIAC CATHETERIZATION  2/14   [facility]; 95% lesion in the ramus intermedius.   [name] CATARACT EXTRACTION W/PHACO Left 12/19/2018   Procedure: CATARACT EXTRACTION PHACO AND INTRAOCULAR LENS PLACEMENT ([place])  LEFT;  Surgeon: Eulogio Bear, MD;  Location: [place];  Service: Ophthalmology;  Laterality: Left;  . CATARACT EXTRACTION W/PHACO Right 03/06/2019   Procedure: CATARACT EXTRACTION PHACO AND INTRAOCULAR LENS PLACEMENT (IOC)RIGHT;  Surgeon: Eulogio Bear, MD;  Location: [place];  Service: Ophthalmology;  Laterality: Right;  . CORONARY ANGIOPLASTY  1996   [facility] x1  . CORONARY ANGIOPLASTY  2/14   Severe restenosis in the ostial ramus. Status post angioplasty and drug-eluting stent placement with a 2.5 x 18 mm Xience drug-eluting stent  . ESOPHAGOGASTRODUODENOSCOPY N/A 08/03/2018   Procedure: ESOPHAGOGASTRODUODENOSCOPY (EGD);  Surgeon: Lin Landsman, MD;  Location: [facility] ENDOSCOPY;  Service: Gastroenterology;  Laterality: N/A;  . ESOPHAGOGASTRODUODENOSCOPY (EGD) WITH PROPOFOL N/A 11/03/2018   Procedure: ESOPHAGOGASTRODUODENOSCOPY (EGD) WITH PROPOFOL;  Surgeon: Lin Landsman, MD;  Location: [facility] ENDOSCOPY;  Service: Gastroenterology;  Laterality: N/A;  . ESOPHAGOGASTRODUODENOSCOPY (EGD) WITH PROPOFOL N/A 12/06/2018   Procedure: ESOPHAGOGASTRODUODENOSCOPY (EGD) WITH PROPOFOL;  Surgeon: Lucilla Lame, MD;  Location: [facility] ENDOSCOPY;  Service: Endoscopy;  Laterality: N/A;  . IR CT HEAD LTD  01/30/2021  . IR PERCUTANEOUS ART THROMBECTOMY/INFUSION INTRACRANIAL INC DIAG ANGIO  01/30/2021  . LEFT  HEART CATH AND CORONARY ANGIOGRAPHY N/A 01/28/2021   Procedure: LEFT HEART CATH AND CORONARY ANGIOGRAPHY;  Surgeon: Nelva Bush, MD;  Location: [place] CV LAB;   Service: Cardiovascular;  Laterality: N/A;  . TONSILLECTOMY AND ADENOIDECTOMY     HPI:  Pt  is a 75 y.o. male with history of HTN, HLD, prior MI, CAD, AAA, CHD, ICM who was admitted for sustained Vtach requiring cardioversion 4/25. Pt had a code stroke called 4/28 due to aphasia and R sided facial droop noted by RN. 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 CTA Head noted an acute occlusion of the L MCA at M3. tPA given, pt s/p thrombectomy 4/28. Failed [facility] Swallow Screen 4/28 due to coughing.   Assessment / Plan / Recommendation Clinical Impression  Pt was seen for speech/language evaluation. Pt denied any baseline deficits in speech, language, or cognition, and he indicated that he is currently retired. No family/friends were present to corroborate these reports. Pt presented with moderate to severe non-fluent aphasia characterized by impairments in receptive and expressive language with more significant difficulty with verbal expression. He produced "yes" and "no" and some two-word utterances such as "you would" in response to questions/statements. He was able to count to four, but he exhibited difficulty beyond that point or producing other automatic sequences due to perseveration on "one". Pt exhibited difficulty with all naming tasks despite prompts/cues. Neologisms were occasional noted during attempts at naming, but perseveration, was most frequently demonstrated. Pt was able to follow 2-step commands, answer complex yes/no questions, and demonstrate sentence-level reading comprehension. However, he exhibited difficulty with some 3-step commands and with paragraph-level auditory and reading comprehension. Groping movements and inconsistent error patterns were intermittently noted during attempts at word production and phoneme repetition. These behaviors persistented even when models were provided for articulatory placement. SLP questions the  possibility of there being a motor planning component. Skilled SLP services are clinically indicated at this time to improve language skills and for further assessment of motor planning to rule out apraxia of speech.    SLP Assessment  SLP Recommendation/Assessment: Patient needs continued Speech Lanaguage Pathology Services SLP Visit Diagnosis: Aphasia (R47.01)    Follow Up Recommendations  Inpatient Rehab    Frequency and Duration min 2x/week  2 weeks      SLP Evaluation Cognition  Overall Cognitive Status: Difficult to assess (Due to verbal expression deficits) Arousal/Alertness: Awake/alert       Comprehension  Auditory Comprehension Overall Auditory Comprehension: Impaired Yes/No Questions: Impaired Basic Biographical Questions:  (5/5) Complex Questions:  (5/5) Paragraph Comprehension (via yes/no questions):  (1/4) Commands: Impaired Two Step Basic Commands:  (4/4) Multistep Basic Commands:  (2/3) Conversation: Simple Reading Comprehension Reading Status: Impaired Word level: Within functional limits Sentence Level: Within functional limits Paragraph Level: Impaired (4/5; 5/5 with cues)    Expression Expression Primary Mode of Expression: Verbal Verbal Expression Overall Verbal Expression: Impaired Initiation: Impaired Automatic Speech: Counting;Day of week;Month of year (Counting: 4/10; days: 0/7) Level of Generative/Spontaneous Verbalization: Word;Phrase Repetition: Impaired Level of Impairment: Word level Naming: Impairment Responsive: Not tested Confrontation: Impaired (0/5) Convergent:  (Sentence completion: 1/4 with part-word cues) Verbal Errors: Perseveration;Aware of errors Pragmatics: No impairment   Oral / Motor  Oral Motor/Sensory Function Overall Oral Motor/Sensory Function: Mild impairment Facial ROM: Reduced right;Suspected CN VII (facial) dysfunction Facial Symmetry: Abnormal symmetry right;Suspected CN VII (facial) dysfunction Facial  Strength: Reduced right;Suspected CN VII (facial) dysfunction Facial Sensation: Within Functional  Limits Lingual ROM: Reduced right;Suspected CN XII (hypoglossal) dysfunction Lingual Symmetry: Abnormal symmetry right;Suspected CN XII (hypoglossal) dysfunction Lingual Strength: Reduced;Suspected CN XII (hypoglossal) dysfunction Lingual Sensation: Within Functional Limits Velum: Within Functional Limits Mandible: Within Functional Limits Motor Speech Overall Motor Speech:  (Difficult to assess due to limited verbal output) Respiration: Within functional limits Phonation: Low vocal intensity Articulation: Impaired Level of Impairment: Word Intelligibility: Intelligible Motor Planning: Impaired Level of Impairment: Word Motor Speech Errors: Aware;Groping for words (variable consistency)   Kellianne Ek I. Hardin Negus, [place], [place] Office number [phone] Pager [place] 01/30/2021, 4:58 PM

## 2021-01-30 NOTE — Progress Notes (Addendum)
[facility]  Date: 01/30/21  Time: 5:49 PM  I was contacted by [name]'s nurse regarding hematoma at the right forearm catheterization site noted today after [name] received tPA for his acute stroke.  Site was examined and demonstrates bruising of the distal forearm with mild firmness.  No localized hematoma is appreciated.  The patient denies pain unless firm pressure is applied to the site.  There is no evidence of active bleeding or expanding hematoma.  He has normal capillary refill, fine touch sensation, and movement in the right hand.  Right radial pulse is 2+.  I do not think there is significant ongoing bleeding from the arteriotomy at this time.  I recommend elevation of the arm and application of an ACE wrap with mild compression for 1 to 2 hours.  The site should continue to be monitored closely and the on-call provider notified if there is evidence of enlarging hematoma or neurovascular compromise.  Nelva Bush, MD [facility]

## 2021-01-30 NOTE — Progress Notes (Signed)
Pt with wife [name] and son Shanon Brow visiting during day, son switched out with second son during afternoon. Second son educated on visitation policy of 2 visitors per day and informed that moving forward they would not be allowed to switch out. Son verbally aggressive with RN and stated "that's going to be a problem". Reinforced visitation policy with son following this comment.  Candy Sledge, RN

## 2021-01-30 NOTE — Progress Notes (Signed)
Nurse rounding done at 0315 and noted pt with expressive aphasia, right sided facial droop and disoriented.  Code stroke called and Dr. Renella Cunas with Cardiology and Dr. Lorrin Goodell paged and at bedside.  Pt last seen at normal at 0200.  Transferred to CT Scan with Irma Newness RN,  Primary RN, and Dr. Renella Cunas.  Scan completed and TPN gtt started at 0356 in CT Scan.  Pt transferred to IR for intervention.  Report handed off to Anesthesiologist.

## 2021-01-30 NOTE — Progress Notes (Signed)
Patient with some swelling at R radial catheterization site on admission to unit, found to be more swollen with firmness at access site this afternoon. Pressure dressing applied and area marked. MD paged for further instructions, someone to come to bedside to assess.  Candy Sledge, RN

## 2021-01-30 NOTE — Evaluation (Signed)
Clinical/Bedside Swallow Evaluation Patient Details  Name: Garrett Hunter MRN: [id number] Date of Birth: 03-24-1946  Today's Date: 01/30/2021 Time: SLP Start Time (ACUTE ONLY): 0737 SLP Stop Time (ACUTE ONLY): 1533 SLP Time Calculation (min) (ACUTE ONLY): 13.75 min  Past Medical History:  Past Medical History:  Diagnosis Date  . AAA (abdominal aortic aneurysm) ([place])    Small noted during cardiac catheterization in 2011.  [name] Benign neoplasm of colon   . CHF (congestive heart failure) ([facility])    EF 45-50%  . Chronic airway obstruction, not elsewhere classified   . Coronary artery disease    Inferior MI in 1996 treated with TPA. Cardiac cath in 2011 at [facility] showed chronic occlusion of the proximal RCA and proximal left circumflex without obstructive disease in the LAD. Non-ST elevation myocardial infarction in October 2013 while on vacation in [place]. Cardiac catheterization showed plaque rupture in the proximal ramus with thrombus. He had balloon angioplasty done. 01/28/21 cath   . Coronary atherosclerosis of native coronary artery   . Hepatitis A    teenager  . Hyperlipidemia    Intolerance to statins.  . Hypertension   . MI (myocardial infarction) ([place])    x 3 - last one 2013   Past Surgical History:  Past Surgical History:  Procedure Laterality Date  . ABDOMINAL AORTIC ANEURYSM REPAIR     11/16/2016  . ANGIOPLASTY    . CARDIAC CATHETERIZATION  2011  . CARDIAC CATHETERIZATION  2/14   [facility]; 95% lesion in the ramus intermedius.   [name] CATARACT EXTRACTION W/PHACO Left 12/19/2018   Procedure: CATARACT EXTRACTION PHACO AND INTRAOCULAR LENS PLACEMENT ([place])  LEFT;  Surgeon: Eulogio Bear, MD;  Location: [place];  Service: Ophthalmology;  Laterality: Left;  . CATARACT EXTRACTION W/PHACO Right 03/06/2019   Procedure: CATARACT EXTRACTION PHACO AND INTRAOCULAR LENS PLACEMENT (IOC)RIGHT;  Surgeon: Eulogio Bear, MD;  Location: [place];  Service: Ophthalmology;   Laterality: Right;  . CORONARY ANGIOPLASTY  1996   [facility] x1  . CORONARY ANGIOPLASTY  2/14   Severe restenosis in the ostial ramus. Status post angioplasty and drug-eluting stent placement with a 2.5 x 18 mm Xience drug-eluting stent  . ESOPHAGOGASTRODUODENOSCOPY N/A 08/03/2018   Procedure: ESOPHAGOGASTRODUODENOSCOPY (EGD);  Surgeon: Lin Landsman, MD;  Location[facility] ENDOSCOPY;  Service: Gastroenterology;  Laterality: N/A;  . ESOPHAGOGASTRODUODENOSCOPY (EGD) WITH PROPOFOL N/A 11/03/2018   Procedure: ESOPHAGOGASTRODUODENOSCOPY (EGD) WITH PROPOFOL;  Surgeon: Lin Landsman, MD;  Location: [facility] ENDOSCOPY;  Service: Gastroenterology;  Laterality: N/A;  . ESOPHAGOGASTRODUODENOSCOPY (EGD) WITH PROPOFOL N/A 12/06/2018   Procedure: ESOPHAGOGASTRODUODENOSCOPY (EGD) WITH PROPOFOL;  Surgeon: Lucilla Lame, MD;  Location: [facility] ENDOSCOPY;  Service: Endoscopy;  Laterality: N/A;  . IR CT HEAD LTD  01/30/2021  . IR PERCUTANEOUS ART THROMBECTOMY/INFUSION INTRACRANIAL INC DIAG ANGIO  01/30/2021  . LEFT HEART CATH AND CORONARY ANGIOGRAPHY N/A 01/28/2021   Procedure: LEFT HEART CATH AND CORONARY ANGIOGRAPHY;  Surgeon: Nelva Bush, MD;  Location: [place] CV LAB;  Service: Cardiovascular;  Laterality: N/A;  . TONSILLECTOMY AND ADENOIDECTOMY     HPI:  Pt  is a 75 y.o. male with history of HTN, HLD, prior MI, CAD, AAA, CHD, ICM who was admitted for sustained Vtach requiring cardioversion 4/25. Pt had a code stroke called 4/28 due to aphasia and R sided facial droop noted by RN. 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 CTA Head noted an acute occlusion of the L MCA  at [place]. tPA given, pt s/p thrombectomy 4/28. Failed [facility] Swallow Screen 4/28 due to coughing.   Assessment / Plan / Recommendation Clinical Impression  Pt was seen for bedside swallow evaluation and he denied a history of dysphagia. Oral mechanism exam was significant for reduced right-sided  facial and lingual strength and ROM. Dentition was adequate with some missing molars. Pt demonstrated symptoms of oropharyngeal dysphagia characterized by prolonged mastication/bolus formation with regular texture solids, residue in the right lateral sulcus, reduced labial stripping, right-sided anterior spillage, and inconsistent signs of aspiration with consecutive swallows of thin liquids via straw. A dysphagia 2 diet with thin liquids via cup will be initiated at this time. SLP will follow for dysphagia treatment. SLP Visit Diagnosis: Dysphagia, unspecified (R13.10)    Aspiration Risk  Mild aspiration risk    Diet Recommendation Dysphagia 2 (Fine chop);Thin liquid   Liquid Administration via: Cup;No straw Medication Administration: Whole meds with puree Supervision: Intermittent supervision to cue for compensatory strategies Compensations: Slow rate;Small sips/bites;Follow solids with liquid Postural Changes: Seated upright at 90 degrees    Other  Recommendations Oral Care Recommendations: Oral care BID   Follow up Recommendations Inpatient Rehab      Frequency and Duration min 2x/week  2 weeks       Prognosis Prognosis for Safe Diet Advancement: Good Barriers to Reach Goals: Language deficits      Swallow Study   General Date of Onset: 01/29/21 HPI: Pt  is a 75 y.o. male with history of HTN, HLD, prior MI, CAD, AAA, CHD, ICM who was admitted for sustained Vtach requiring cardioversion 4/25. Pt had a code stroke called 4/28 due to aphasia and R sided facial droop noted by RN. 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 CTA Head noted an acute occlusion of the L MCA at M3. tPA given, pt s/p thrombectomy 4/28. Failed [facility] Swallow Screen 4/28 due to coughing. Type of Study: Bedside Swallow Evaluation Previous Swallow Assessment: none Diet Prior to this Study: NPO Temperature Spikes Noted: No Respiratory Status: Nasal  cannula History of Recent Intubation: Yes Length of Intubations (days):  (for procedure) Date extubated: 01/30/21 Behavior/Cognition: Alert;Cooperative;Pleasant mood Oral Cavity Assessment: Within Functional Limits Oral Care Completed by SLP: No    Oral/Motor/Sensory Function Overall Oral Motor/Sensory Function: Mild impairment Facial ROM: Reduced right;Suspected CN VII (facial) dysfunction Facial Symmetry: Abnormal symmetry right;Suspected CN VII (facial) dysfunction Facial Strength: Reduced right;Suspected CN VII (facial) dysfunction Facial Sensation: Within Functional Limits Lingual ROM: Reduced right;Suspected CN XII (hypoglossal) dysfunction Lingual Symmetry: Abnormal symmetry right;Suspected CN XII (hypoglossal) dysfunction Lingual Strength: Reduced;Suspected CN XII (hypoglossal) dysfunction Lingual Sensation: Within Functional Limits Velum: Within Functional Limits Mandible: Within Functional Limits   Ice Chips Ice chips: Within functional limits Presentation: Spoon   Thin Liquid Thin Liquid: Impaired Presentation: Cup;Straw Oral Phase Impairments: Reduced labial seal Oral Phase Functional Implications: Right anterior spillage Pharyngeal  Phase Impairments: Cough - Immediate    Nectar Thick Nectar Thick Liquid: Not tested   Honey Thick Honey Thick Liquid: Not tested   Puree Puree: Impaired Presentation: Spoon Oral Phase Impairments: Reduced labial seal Oral Phase Functional Implications: Right lateral sulci pocketing;Right anterior spillage   Solid     Solid: Impaired Presentation: Self Fed Oral Phase Impairments: Reduced lingual movement/coordination;Impaired mastication;Reduced labial seal Oral Phase Functional Implications: Right lateral sulci pocketing;Impaired mastication;Prolonged oral transit     Garrett Hunter I. Hardin Negus, [place], [place] Office number [phone] Pager [phone]  Horton Marshall 01/30/2021,4:39 PM

## 2021-01-31 ENCOUNTER — Inpatient Hospital Stay (HOSPITAL_COMMUNITY): Payer: Medicare Other

## 2021-01-31 ENCOUNTER — Encounter (HOSPITAL_COMMUNITY): Payer: Self-pay | Admitting: *Deleted

## 2021-01-31 DIAGNOSIS — I472 Ventricular tachycardia: Secondary | ICD-10-CM

## 2021-01-31 LAB — BASIC METABOLIC PANEL
Anion gap: 7 (ref 5–15)
BUN: 14 mg/dL (ref 8–23)
CO2: 29 mmol/L (ref 22–32)
Calcium: 8.8 mg/dL — ABNORMAL LOW (ref 8.9–10.3)
Chloride: 102 mmol/L (ref 98–111)
Creatinine, Ser: 0.95 mg/dL (ref 0.61–1.24)
GFR, Estimated: >60 mL/min (ref >60)
Glucose, Bld: 127 mg/dL — ABNORMAL HIGH (ref 70–99)
Potassium: 4 mmol/L (ref 3.5–5.1)
Sodium: 138 mmol/L (ref 135–145)

## 2021-01-31 MED ORDER — CLOPIDOGREL BISULFATE 75 MG PO TABS
75.0000 mg | ORAL_TABLET | Freq: Every day | ORAL | Status: DC
  Administered 2021-01-31 – 2021-02-02 (×3): 75 mg via ORAL
  Filled 2021-01-31 (×3): qty 1

## 2021-01-31 MED ORDER — PANTOPRAZOLE SODIUM 40 MG PO PACK
40.0000 mg | PACK | Freq: Every day | ORAL | Status: DC
  Administered 2021-01-31 – 2021-02-01 (×2): 40 mg via ORAL
  Filled 2021-01-31 (×2): qty 20

## 2021-01-31 MED ORDER — ASPIRIN EC 81 MG PO TBEC
81.0000 mg | DELAYED_RELEASE_TABLET | Freq: Every day | ORAL | Status: DC
  Administered 2021-01-31 – 2021-02-02 (×3): 81 mg via ORAL
  Filled 2021-01-31 (×3): qty 1

## 2021-01-31 NOTE — Progress Notes (Signed)
I just spoke to MRI and there are 8 STAT MRI's waiting from the ER at this time and we will be unable to bring patient down at 0100 and likely not able to the rest of the shift.   I just spoke with Dr. Lorrin Goodell on the phone at this time and we will do the 24 hour post TPA CT head instead for now.

## 2021-01-31 NOTE — Progress Notes (Signed)
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 Loreta AveWagner. Patient awake and alert sitting in bed watching TV. Follows simple commands. Speech dysarthric. Moving all extremities. Left femoral puncture site c/d/i.  CT head this AM: 1. No acute intracranial hemorrhage or infarct. 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   Allergies: Crestor [rosuvastatin], Livalo [pitavastatin], Pravachol [pravastatin sodium], and Zocor [simvastatin]  Medications: Prior to Admission medications   Medication Sig Start Date End Date Taking? Authorizing Provider  aspirin EC 81 MG tablet Take 81 mg by mouth daily. Swallow whole.   Yes [provider]  calcium-vitamin D (OSCAL WITH D) 500-200 MG-UNIT tablet Take 1 tablet by mouth daily with breakfast.   Yes [provider]  carvedilol (COREG) 12.5 MG tablet Take 12.5 mg by mouth 2 (two) times daily with a meal.   Yes [provider]  Coenzyme Q10 (COQ10 PO) Take by mouth.   Yes [provider]  lisinopril-hydrochlorothiazide (ZESTORETIC) 20-12.5 MG tablet Take 2 tablets by mouth daily.   Yes [provider]  Multiple Vitamin (MULTIVITAMIN WITH MINERALS) TABS tablet Take 1 tablet by mouth daily.   Yes [provider]  rosuvastatin (CRESTOR) 5 MG tablet Take 5 mg by mouth daily.   Yes [provider]  vitamin C (ASCORBIC ACID) 500 MG tablet Take 500 mg by mouth daily.   Yes [provider]     Vital Signs: BP (!) 164/70   Pulse 71   Temp 98 F (36.7 C) (Oral)   Resp 15   Wt 228 lb 9.9 oz (103.7 kg)    SpO2 97%   BMI 31.89 kg/m   Physical Exam Vitals and nursing note reviewed.  Constitutional:      General: He is not in acute distress. Pulmonary:     Effort: Pulmonary effort is normal. No respiratory distress.  Skin:    General: Skin is warm and dry.     Comments: Left femoral puncture site soft with surrounding ecchymosis, minimal ooze on bandage (old per RN) but no tenderness, active bleeding, or hematoma.  Neurological:     Mental Status: He is alert.     Comments: Alert, awake, and oriented x3. Follows simple commands. Speech dysarthric. PERRL bilaterally. Right facial droop. Tongue midline. Can spontaneously move all extremities. No pronator drift. Fine motor and coordination intact and symmetric. Distal pulses (DPs) dopplerable bilaterally.     Imaging: CT HEAD WO CONTRAST  Result Date: 01/31/2021 CLINICAL DATA:  Stroke, new onset headache following thrombolytic therapy EXAM: CT HEAD WITHOUT CONTRAST TECHNIQUE: Contiguous axial images were obtained from the base of the skull through the vertex without intravenous contrast. COMPARISON:  01/30/2021 at 3:46 a.m. FINDINGS: Brain: Normal anatomic configuration. Parenchymal volume loss is commensurate with the patient's age. Mild periventricular white matter changes are present likely reflecting the sequela of small vessel ischemia. Stable remote infarct within the right subinsular white matter. No abnormal intra or extra-axial mass lesion or fluid collection. No abnormal mass effect or midline shift. No evidence of acute intracranial hemorrhage or infarct. Ventricular size is normal. Cerebellum unremarkable. Vascular: No asymmetric hyperdense vasculature at the skull base. Advanced atherosclerotic calcification is seen within the carotid siphons  bilaterally. Skull: Intact Sinuses/Orbits: Paranasal sinuses are clear. Orbits are unremarkable. Other: Mastoid air cells and middle ear cavities are clear. IMPRESSION: No acute  intracranial hemorrhage or infarct. 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 Electronically Signed   By: Fidela Salisbury MD   On: 01/31/2021 02:08   CARDIAC CATHETERIZATION  Result Date: 01/28/2021 Conclusions: 1. 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  There are minimal luminal irregularities in the LAD without significant stenosis. 2. Widely patent ramus intermedius stent. 3. Moderately elevated left ventricular filling pressure. Recommendations: 1. Continue IV amiodarone and ICU monitoring in the setting of sustained ventricular tachycardia that is likely scar mediated. 2. Gentle diuresis. 3. Aggressive secondary prevention of coronary artery disease. 4. EP consultation for further management of VT. Nelva Bush, MD [facility]   IR [place]  Result Date: 01/30/2021 INDICATION: 75 year old male presents with acute right-sided 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 He is currently in-hospital for treatment of cardiac issues. EXAM: ULTRASOUND-GUIDED ACCESS LEFT COMMON FEMORAL ARTERY MECHANICAL THROMBECTOMY LEFT MCA BRANCHES DEPLOYMENT OF ANGIO-SEAL FOR HEMOSTASIS COMPARISON:  Same day CT imaging MEDICATIONS: 4 MG intra arterial Integrilin ANESTHESIA/SEDATION: The anesthesia team was present to provide general endotracheal tube anesthesia and for patient monitoring during the procedure. Intubation was performed in negative pressure Bay in neuro IR holding. Left radial arterial line was performed by the anesthesia team. Interventional neuro radiology nursing staff was also present. CONTRAST:  130 cc Omnipaque FLUOROSCOPY TIME:  Fluoroscopy Time: 69 minutes 6 seconds (2,406 mGy). COMPLICATIONS: None TECHNIQUE: Informed written consent was obtained from the patient's family after a thorough discussion of the procedural risks, benefits and alternatives. Specific risks discussed include: Bleeding, infection, contrast  reaction, kidney injury/failure, need for further procedure/surgery, arterial injury or dissection, embolization to new territory, intracranial hemorrhage (10-15% risk), neurologic deterioration, cardiopulmonary collapse, death. All questions were addressed. Maximal Sterile Barrier Technique was utilized including during the procedure including caps, mask, sterile gowns, sterile gloves, sterile drape, hand hygiene and skin antiseptic. A timeout was performed prior to the initiation of the procedure. The anesthesia team was present to provide general endotracheal tube anesthesia and for patient monitoring during the procedure. Interventional neuro radiology nursing staff was also present. FINDINGS: Initial Findings: Left common carotid artery: Normal course caliber and contour. Calcified plaque at the left carotid bifurcation. Left external carotid artery: Patent with antegrade flow. Left internal carotid artery: Normal course caliber and contour of the cervical portion. Vertical and petrous segment patent with normal course caliber contour. Cavernous segment patent. Clinoid segment patent. Antegrade flow of the ophthalmic artery, with adequate choroidal blush. . Terminus patent. Fetal left PCA origin with a patent inflow of the posterior circulation contributing to some slip stream artifact of the flow into the left PCA. Left MCA: M1 segment is patent without significant atherosclerotic change. There is a 90 degree down turn of the distal M1 before the M2 division point. The inferior division of the MCA contributes to parietal flow and is the non dominant. The superior division contributes to the frontal and the majority of the parietal region, and is the dominant of the 2 branches. The occluded artery is a branch division of the superior division operculum branch, distal M2/proximal M3 with a corresponding absence of late arterial perfusion of the perisylvian region on the initial images. Significant tortuosity of  the inferior and posterior division Left ACA: A 1 segment patent. A 2 segment perfuses the left  territory. There is some cross flow through patent anterior communicating artery to the right hemisphere. Intraoperative findings: The tortuosity of the occluded superior branch was an obstacle for microcatheter placement. The majority of the 69 minutes of fluoroscopy utilized was the attempt of accessing this occluded branch. A nearly 360 loop was observed, which required several catheter and wire combinations. One pass was performed with the stent retriever after a successful catheter placement, exceeding 1 hour fluoroscopy time. Deployment of the 3 mm solitaire device was into the distal cortical branches crossing a small occlusive thrombus of the affected artery. Given the physiologic response to hypertension after a single thrombectomy pass and the difficulty in navigating to this location, we elected not to proceed with a second attempt. Completion Findings: Left MCA: TICI 0: No perfusion or antegrade flow beyond site of occlusion, of the affected vessel. PROCEDURE: The anesthesia team was present to provide general endotracheal tube anesthesia and for patient monitoring during the procedure. Intubation was performed in negative pressure Bay in neuro IR holding. Interventional neuro radiology nursing staff was also present. Ultrasound survey of the left inguinal region was performed with images stored and sent to PACs. 11 blade scalpel was used to make a small incision. Blunt dissection was performed with US guidance. A micropuncture needle was used access the left common femoral artery under ultrasound. With excellent arterial blood flow returned, an .018 micro wire was passed through the needle, observed to enter the abdominal aorta under fluoroscopy. The needle was removed, and a micropuncture sheath was placed over the wire. The inner dilator and wire were removed, and an 035 wire was advanced under fluoroscopy  into the abdominal aorta. The sheath was removed and a 25cm 49F straight vascular sheath was placed. The dilator was removed and the sheath was flushed. Sheath was attached to pressurized and heparinized saline bag for constant forward flow. Initial angiogram was performed of the endovascular stent and left iliac system to assure that there was flow beyond the sheath, which was confirmed. A coaxial system was then advanced over the 035 wire through the sheath. This included a 95cm 087 "Walrus" balloon guide with coaxial 125cm Berenstein diagnostic catheter. This was advanced to the proximal descending thoracic aorta. Wire was then removed. Double flush of the catheter was performed. Catheter was then used to select the left common carotid artery. Angiogram was performed. Using roadmap technique, the catheter was advanced over a standard glide wire into left cervical ICA, with distal position achieved of the balloon guide. The diagnostic catheter and the wire were removed. Double flush was then performed of the balloon guide. Formal angiogram was performed. Multiple obliquity and magnification views were performed to identify the paths of the occluded vessel. Road map function was used once the occluded vessel was identified. Copious back flush was performed and the balloon catheter was attached to heparinized and pressurized saline bag for forward flow. Given the size of the perceived vessel and the extreme tortuosity, we elected first to attempt a direct aspiration first strategy. A zoom 35 aspiration catheter was advanced through the balloon catheter with a coaxial synchro soft catheter. The zoom catheter was navigated into the superior division, although would not pass easily beyond the first division point. Given the significant resistance to advancing the aspiration catheter, we elected to abandon the strategy within aspiration catheter and attempt a stent retriever strategy with passage of only the  microcatheter. Zoom 35 catheter was removed. We first used a Trevo Provue18 microcatheter and a  synchro soft wire to navigate to the occlusion. The tortuosity of the system proved to be in obstacle 2 select the affected branch. We changed to an Aristotle microwire with the Trevo catheter. This was unsuccessful. We withdrew the microcatheter and exchanged for an angled headway Duo catheter. The synchro soft and the Aristotle wire were both used with the angled microcatheter. A double angled headliner wire 012 was attempted without success. The majority of the fluoroscopy time was necessary for these attempts of attempted catheter position. Given our sense that we would not be able to reach the occlusion for treatment, we elected to infuse 2 mg of intra arterial Integrilin into the superior division at this time. Adequate flush was then performed. We did make an attempt for a final catheter position after the Integrilin infusion. Ultimately, on our final attempt, the angled headway duo catheter and a J curve 012 headliner wire selected the affected branch with the J wire advancing just beyond the occlusion site. With the microcatheter in position in the proximal branch, the J wire would not pass distally. The J wire was then exchanged for the Aristotle wire, which we navigated into a distal cortical branch location. The microcatheter was then passed beyond the occlusion site. Wire was removed and aspiration blood was performed to confirm a luminal position. We then elected to attempt stent retriever thrombectomy. A 3 mm x 20 mm solitaire device was selected. A rotating hemostatic valve was then attached to the back end of the microcatheter, and a pressurized and heparinized saline bag was attached to the catheter. The device was inserted into the rotating hemostatic valve. Back flush was achieved at the rotating hemostatic valve, and then the device was gently advanced through the microcatheter to the distal end. The  retriever was then unsheathed by withdrawing the microcatheter under fluoroscopy. Once the retriever was completely unsheathed, the microcatheter was carefully stripped from the delivery device. Once the device was completely open, a control angiogram was performed from the balloon guide. This identified a small filling defect centered within the length of the solitaire device, with flow through the solitaire device into the distal cortical branches of the affected territory. A 3 minute time interval was observed before the device was withdrawn. After 3 minutes, the balloon at the balloon guide catheter was then inflated under fluoroscopy for proximal flow arrest. Constant aspiration was then performed at the balloon guide, as the retriever was gently and slowly withdrawn with fluoroscopic observation. Aspiration continued until the device was removed from the system. The Touhy was removed from the back end of the balloon guide and the device was retrieved. Free aspiration was confirmed at the hub of the balloon guide catheter, with free blood return confirmed. The balloon was then deflated, and a control angiogram was performed. After this initial pass, the artery remained occluded. The patient's blood pressure spike to 0000000 systolic with withdrawal of the catheter. Given the physiologic response, the location of the clot and the difficulty navigating to this site after 1 hour fluoroscopy time, we elected to withdraw from the case, so as not to commit any complication. Balloon guide was then removed. The skin at the puncture site was then cleaned with Chlorhexidine. Given the absence of stock of a suitable sized Angio-Seal, a 7 French sheath was placed at the puncture site on the J wire. 2 minutes observation with manual pressure was observed. The 7 French sheath was then removed and an 48F angioseal was deployed. Flat panel CT was  performed. Patient was extubated once the CT was reviewed. Patient tolerated the  procedure well. No complications were encountered and no significant blood loss encountered. IMPRESSION: Status post ultrasound-guided access left common femoral artery for left-sided cervical and cerebral angiogram and attempt mechanical thrombectomy of left opercular branch (M3) of the dominant superior division, with persisting TICI 0 flow of the affected branch after 1 pass solitaire stentreiver. Angio-Seal for hemostasis. Signed, Dulcy Fanny. Dellia Nims, RPVI [facility] PLAN: The patient was extubated. ICU status Target systolic blood pressure of less than 180, status post tPA Left hip straight time 6 hours Frequent neurovascular checks Repeat neurologic imaging with CT and/MRI at the discretion of neurology team Electronically Signed   By: Corrie Mckusick D.O.   On: 01/30/2021 10:10   DG Chest Port 1 View  Result Date: 01/27/2021 CLINICAL DATA:  Chest pain and tachycardia EXAM: PORTABLE CHEST 1 VIEW COMPARISON:  November 21, 2012. FINDINGS: The fibrillator leads overlie the left chest. The heart size and mediastinal contours are within normal limits. Both lungs are clear. The visualized skeletal structures are unremarkable. IMPRESSION: No active disease. Electronically Signed   By: Dahlia Bailiff MD   On: 01/27/2021 18:36   ECHOCARDIOGRAM COMPLETE  Result Date: 01/28/2021    ECHOCARDIOGRAM REPORT   Patient Name:   Garrett Hunter [facility] Date of Exam: 01/28/2021 Medical Rec #:  [id number]          Height:       71.0 in Accession #:    [id number]         Weight:       228.4 lb Date of Birth:  08/04/1946          BSA:          2.231 m Patient Age:    75 years           BP:           142/77 mmHg Patient Gender: M                  HR:           64 bpm. Exam Location:  [facility] Procedure: 2D Echo, Cardiac Doppler and Color Doppler Indications:     Ventricular Tachycardia I47.2  History:         Patient has prior history of Echocardiogram examinations, most                   recent 10/07/2016. CHF, Previous Myocardial Infarction; Risk                  Factors:Hypertension.  Sonographer:     Sherrie Sport RDCS (AE) Referring Phys:  [id number] BRITTON L R[place] Diagnosing Phys: Harrell Gave End MD  Sonographer Comments: Suboptimal parasternal window and Technically challenging study due to limited acoustic windows. IMPRESSIONS  1. Left ventricular ejection fraction, by estimation, is 45 to 50%. The left ventricle has mildly decreased function. The left ventricle demonstrates global hypokinesis. There is mild left ventricular hypertrophy. Left ventricular diastolic parameters are consistent with Grade II diastolic dysfunction (pseudonormalization). There is akinesis of the left ventricular, basal inferior segment.  2. Right ventricular systolic function is normal. The right ventricular size is normal.  3. Left atrial size was mildly dilated.  4. The mitral valve is grossly normal. Trivial mitral valve regurgitation. No evidence of mitral stenosis.  5. The aortic valve was not well visualized. Aortic valve regurgitation is not visualized. No aortic  stenosis is present.  6. Aortic dilatation noted. There is mild dilatation of the aortic root, measuring 39 mm. FINDINGS  Left Ventricle: Left ventricular ejection fraction, by estimation, is 45 to 50%. The left ventricle has mildly decreased function. The left ventricle demonstrates global hypokinesis. The left ventricular internal cavity size was normal in size. There is  mild left ventricular hypertrophy. Left ventricular diastolic parameters are consistent with Grade II diastolic dysfunction (pseudonormalization). Right Ventricle: The right ventricular size is normal. No increase in right ventricular wall thickness. Right ventricular systolic function is normal. Left Atrium: Left atrial size was mildly dilated. Right Atrium: Right atrial size was normal in size. Pericardium: The pericardium was not well visualized. Mitral Valve: The  mitral valve is grossly normal. Mild mitral annular calcification. Trivial mitral valve regurgitation. No evidence of mitral valve stenosis. Tricuspid Valve: The tricuspid valve is not well visualized. Tricuspid valve regurgitation is trivial. Aortic Valve: The aortic valve was not well visualized. Aortic valve regurgitation is not visualized. No aortic stenosis is present. Aortic valve mean gradient measures 1.0 mmHg. Aortic valve peak gradient measures 2.1 mmHg. Aortic valve area, by VTI measures 3.20 cm. Pulmonic Valve: The pulmonic valve was not well visualized. Aorta: Aortic dilatation noted. There is mild dilatation of the aortic root, measuring 39 mm. IAS/Shunts: The interatrial septum was not well visualized.  LEFT VENTRICLE PLAX 2D LVIDd:         4.44 cm  Diastology LVIDs:         3.22 cm  LV e' medial:    4.13 cm/s LV PW:         1.33 cm  LV E/e' medial:  20.4 LV IVS:        1.01 cm  LV e' lateral:   5.66 cm/s LVOT diam:     2.30 cm  LV E/e' lateral: 14.9 LV SV:         53 LV SV Index:   24 LVOT Area:     4.15 cm  RIGHT VENTRICLE RV Basal diam:  2.86 cm RV S prime:     10.65 cm/s LEFT ATRIUM             Index       RIGHT ATRIUM           Index LA diam:        3.90 cm 1.75 cm/m  RA Area:     16.70 cm LA Vol (A2C):   88.2 ml 39.53 ml/m RA Volume:   43.20 ml  19.36 ml/m LA Vol (A4C):   75.7 ml 33.93 ml/m LA Biplane Vol: 82.5 ml 36.98 ml/m  AORTIC VALVE AV Area (Vmax):    3.45 cm AV Area (Vmean):   2.95 cm AV Area (VTI):     3.20 cm AV Vmax:           73.20 cm/s AV Vmean:          56.100 cm/s AV VTI:            0.166 m AV Peak Grad:      2.1 mmHg AV Mean Grad:      1.0 mmHg LVOT Vmax:         60.70 cm/s LVOT Vmean:        39.900 cm/s LVOT VTI:          0.128 m LVOT/AV VTI ratio: 0.77  AORTA Ao Root diam: 3.83 cm MITRAL VALVE  TRICUSPID VALVE MV Area (PHT): 5.79 cm    TR Peak grad:   7.1 mmHg MV Decel Time: 131 msec    TR Vmax:        133.00 cm/s MV E velocity: 84.40 cm/s MV A velocity:  42.80 cm/s  SHUNTS MV E/A ratio:  1.97        Systemic VTI:  0.13 m                            Systemic Diam: 2.30 cm Nelva Bush MD Electronically signed by Nelva Bush MD Signature Date/Time: 01/28/2021/10:32:38 AM    Final    IR PERCUTANEOUS ART THROMBECTOMY/INFUSION INTRACRANIAL INC DIAG ANGIO  Result Date: 01/30/2021 INDICATION: 75 year old male presents with acute right-sided 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 He is currently in-hospital for treatment of cardiac issues. EXAM: ULTRASOUND-GUIDED ACCESS LEFT COMMON FEMORAL ARTERY MECHANICAL THROMBECTOMY LEFT MCA BRANCHES DEPLOYMENT OF ANGIO-SEAL FOR HEMOSTASIS COMPARISON:  Same day CT imaging MEDICATIONS: 4 MG intra arterial Integrilin ANESTHESIA/SEDATION: The anesthesia team was present to provide general endotracheal tube anesthesia and for patient monitoring during the procedure. Intubation was performed in negative pressure Bay in neuro IR holding. Left radial arterial line was performed by the anesthesia team. Interventional neuro radiology nursing staff was also present. CONTRAST:  130 cc Omnipaque FLUOROSCOPY TIME:  Fluoroscopy Time: 69 minutes 6 seconds (2,406 mGy). COMPLICATIONS: None TECHNIQUE: Informed written consent was obtained from the patient's family after a thorough discussion of the procedural risks, benefits and alternatives. Specific risks discussed include: Bleeding, infection, contrast reaction, kidney injury/failure, need for further procedure/surgery, arterial injury or dissection, embolization to new territory, intracranial hemorrhage (10-15% risk), neurologic deterioration, cardiopulmonary collapse, death. All questions were addressed. Maximal Sterile Barrier Technique was utilized including during the procedure including caps, mask, sterile gowns, sterile gloves, sterile drape, hand hygiene and skin antiseptic. A timeout was performed prior to the initiation of the procedure. The anesthesia team was present to  provide general endotracheal tube anesthesia and for patient monitoring during the procedure. Interventional neuro radiology nursing staff was also present. FINDINGS: Initial Findings: Left common carotid artery: Normal course caliber and contour. Calcified plaque at the left carotid bifurcation. Left external carotid artery: Patent with antegrade flow. Left internal carotid artery: Normal course caliber and contour of the cervical portion. Vertical and petrous segment patent with normal course caliber contour. Cavernous segment patent. Clinoid segment patent. Antegrade flow of the ophthalmic artery, with adequate choroidal blush. . Terminus patent. Fetal left PCA origin with a patent inflow of the posterior circulation contributing to some slip stream artifact of the flow into the left PCA. Left MCA: M1 segment is patent without significant atherosclerotic change. There is a 90 degree down turn of the distal M1 before the M2 division point. The inferior division of the MCA contributes to parietal flow and is the non dominant. The superior division contributes to the frontal and the majority of the parietal region, and is the dominant of the 2 branches. The occluded artery is a branch division of the superior division operculum branch, distal M2/proximal M3 with a corresponding absence of late arterial perfusion of the perisylvian region on the initial images. Significant tortuosity of the inferior and posterior division Left ACA: A 1 segment patent. A 2 segment perfuses the left territory. There is some cross flow through patent anterior communicating artery to the right hemisphere. Intraoperative findings: The tortuosity of the occluded superior branch was an  obstacle for microcatheter placement. The majority of the 69 minutes of fluoroscopy utilized was the attempt of accessing this occluded branch. A nearly 360 loop was observed, which required several catheter and wire combinations. One pass was performed with  the stent retriever after a successful catheter placement, exceeding 1 hour fluoroscopy time. Deployment of the 3 mm solitaire device was into the distal cortical branches crossing a small occlusive thrombus of the affected artery. Given the physiologic response to hypertension after a single thrombectomy pass and the difficulty in navigating to this location, we elected not to proceed with a second attempt. Completion Findings: Left MCA: TICI 0: No perfusion or antegrade flow beyond site of occlusion, of the affected vessel. PROCEDURE: The anesthesia team was present to provide general endotracheal tube anesthesia and for patient monitoring during the procedure. Intubation was performed in negative pressure [place] in neuro IR holding. Interventional neuro radiology nursing staff was also present. Ultrasound survey of the left inguinal region was performed with images stored and sent to PACs. 11 blade scalpel was used to make a small incision. Blunt dissection was performed with US guidance. A micropuncture needle was used access the left common femoral artery under ultrasound. With excellent arterial blood flow returned, an .018 micro wire was passed through the needle, observed to enter the abdominal aorta under fluoroscopy. The needle was removed, and a micropuncture sheath was placed over the wire. The inner dilator and wire were removed, and an 035 wire was advanced under fluoroscopy into the abdominal aorta. The sheath was removed and a 25cm 42F straight vascular sheath was placed. The dilator was removed and the sheath was flushed. Sheath was attached to pressurized and heparinized saline bag for constant forward flow. Initial angiogram was performed of the endovascular stent and left iliac system to assure that there was flow beyond the sheath, which was confirmed. A coaxial system was then advanced over the 035 wire through the sheath. This included a 95cm 087 "Walrus" balloon guide with coaxial 125cm  Berenstein diagnostic catheter. This was advanced to the proximal descending thoracic aorta. Wire was then removed. Double flush of the catheter was performed. Catheter was then used to select the left common carotid artery. Angiogram was performed. Using roadmap technique, the catheter was advanced over a standard glide wire into left cervical ICA, with distal position achieved of the balloon guide. The diagnostic catheter and the wire were removed. Double flush was then performed of the balloon guide. Formal angiogram was performed. Multiple obliquity and magnification views were performed to identify the paths of the occluded vessel. Road map function was used once the occluded vessel was identified. Copious back flush was performed and the balloon catheter was attached to heparinized and pressurized saline bag for forward flow. Given the size of the perceived vessel and the extreme tortuosity, we elected first to attempt a direct aspiration first strategy. A zoom 35 aspiration catheter was advanced through the balloon catheter with a coaxial synchro soft catheter. The zoom catheter was navigated into the superior division, although would not pass easily beyond the first division point. Given the significant resistance to advancing the aspiration catheter, we elected to abandon the strategy within aspiration catheter and attempt a stent retriever strategy with passage of only the microcatheter. Zoom 35 catheter was removed. We first used a Trevo Provue18 microcatheter and a synchro soft wire to navigate to the occlusion. The tortuosity of the system proved to be in obstacle 2 select the affected branch. We changed to  an Nurse, mental health with the Amberg catheter. This was unsuccessful. We withdrew the microcatheter and exchanged for an angled headway Duo catheter. The synchro soft and the Aristotle wire were both used with the angled microcatheter. A double angled headliner wire 012 was attempted without  success. The majority of the fluoroscopy time was necessary for these attempts of attempted catheter position. Given our sense that we would not be able to reach the occlusion for treatment, we elected to infuse 2 mg of intra arterial Integrilin into the superior division at this time. Adequate flush was then performed. We did make an attempt for a final catheter position after the Integrilin infusion. Ultimately, on our final attempt, the angled headway duo catheter and a J curve 012 headliner wire selected the affected branch with the J wire advancing just beyond the occlusion site. With the microcatheter in position in the proximal branch, the J wire would not pass distally. The J wire was then exchanged for the Aristotle wire, which we navigated into a distal cortical branch location. The microcatheter was then passed beyond the occlusion site. Wire was removed and aspiration blood was performed to confirm a luminal position. We then elected to attempt stent retriever thrombectomy. A 3 mm x 20 mm solitaire device was selected. A rotating hemostatic valve was then attached to the back end of the microcatheter, and a pressurized and heparinized saline bag was attached to the catheter. The device was inserted into the rotating hemostatic valve. Back flush was achieved at the rotating hemostatic valve, and then the device was gently advanced through the microcatheter to the distal end. The retriever was then unsheathed by withdrawing the microcatheter under fluoroscopy. Once the retriever was completely unsheathed, the microcatheter was carefully stripped from the delivery device. Once the device was completely open, a control angiogram was performed from the balloon guide. This identified a small filling defect centered within the length of the solitaire device, with flow through the solitaire device into the distal cortical branches of the affected territory. A 3 minute time interval was observed before the device  was withdrawn. After 3 minutes, the balloon at the balloon guide catheter was then inflated under fluoroscopy for proximal flow arrest. Constant aspiration was then performed at the balloon guide, as the retriever was gently and slowly withdrawn with fluoroscopic observation. Aspiration continued until the device was removed from the system. The Touhy was removed from the back end of the balloon guide and the device was retrieved. Free aspiration was confirmed at the hub of the balloon guide catheter, with free blood return confirmed. The balloon was then deflated, and a control angiogram was performed. After this initial pass, the artery remained occluded. The patient's blood pressure spike to 0000000 systolic with withdrawal of the catheter. Given the physiologic response, the location of the clot and the difficulty navigating to this site after 1 hour fluoroscopy time, we elected to withdraw from the case, so as not to commit any complication. Balloon guide was then removed. The skin at the puncture site was then cleaned with Chlorhexidine. Given the absence of stock of a suitable sized Angio-Seal, a 7 French sheath was placed at the puncture site on the J wire. 2 minutes observation with manual pressure was observed. The 7 French sheath was then removed and an 68F angioseal was deployed. Flat panel CT was performed. Patient was extubated once the CT was reviewed. Patient tolerated the procedure well. No complications were encountered and no significant blood loss encountered. IMPRESSION:  Status post ultrasound-guided access left common femoral artery for left-sided cervical and cerebral angiogram and attempt mechanical thrombectomy of left opercular branch (M3) of the dominant superior division, with persisting TICI 0 flow of the affected branch after 1 pass solitaire stentreiver. Angio-Seal for hemostasis. Signed, Dulcy Fanny. Dellia Nims, RPVI [facility] Radiology PLAN:  The patient was extubated. ICU status Target systolic blood pressure of less than 180, status post tPA Left hip straight time 6 hours Frequent neurovascular checks Repeat neurologic imaging with CT and/MRI at the discretion of neurology team Electronically Signed   By: Corrie Mckusick D.O.   On: 01/30/2021 10:10   CT HEAD CODE STROKE WO CONTRAST  Result Date: 01/30/2021 CLINICAL DATA:  Code stroke. Initial evaluation for acute aphasia, right-sided facial droop EXAM: CT HEAD WITHOUT CONTRAST TECHNIQUE: Contiguous axial images were obtained from the base of the skull through the vertex without intravenous contrast. COMPARISON:  None available. FINDINGS: Brain: Age-related cerebral atrophy with mild chronic small vessel ischemic disease. No acute intracranial hemorrhage. There is question of subtle hypodensity involving the left insula and overlying left frontal operculum, suspicious for possible early acute left MCA distribution infarct. No other visible acute large vessel territory infarct. No mass lesion, midline shift, or mass effect. No hydrocephalus or extra-axial fluid collection. Vascular: No visible hyperdense vessel. Calcified atherosclerosis present at skull base. Skull: Scalp soft tissues within normal limits.  Calvarium intact. Sinuses/Orbits: Globes and orbital soft tissues within normal limits. Patient status post bilateral ocular lens replacement. Paranasal sinuses are clear. No mastoid effusion. Other: None. ASPECTS [facility] Stroke Program Early CT Score) - Ganglionic level infarction (caudate, lentiform nuclei, internal capsule, insula, M1-M3 cortex): 6 - Supraganglionic infarction (M4-M6 cortex): 2 Total score (0-10 with 10 being normal): 8 IMPRESSION: 1. 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 No intracranial hemorrhage. 2. ASPECTS is 8. 3. Age-related cerebral atrophy with mild chronic small  vessel ischemic disease. These results were communicated to Dr. Lorrin Goodell At 3:57 amon 4/28/2022by text page via the [facility] messaging system. Electronically Signed   By: Jeannine Boga M.D.   On: 01/30/2021 04:05   CT ANGIO HEAD CODE STROKE  Result Date: 01/30/2021 CLINICAL DATA:  Initial evaluation for acute stroke, aphasia, right-sided facial droop. EXAM: CT ANGIOGRAPHY HEAD AND NECK TECHNIQUE: Multidetector CT imaging of the head and neck was performed using the standard protocol during bolus administration of intravenous contrast. Multiplanar CT image reconstructions and MIPs were obtained to evaluate the vascular anatomy. Carotid stenosis measurements (when applicable) are obtained utilizing NASCET criteria, using the distal internal carotid diameter as the denominator. CONTRAST:  60mL OMNIPAQUE IOHEXOL 350 MG/ML SOLN COMPARISON:  Prior CT from earlier the same day. FINDINGS: CTA NECK FINDINGS Aortic arch: Visualized aortic arch normal in caliber with normal branch pattern. Moderate atheromatous change about the visualized arch and origin of the great vessels without hemodynamically significant stenosis. Right carotid system: Right CCA patent from its origin to the bifurcation without stenosis. Scattered mixed plaque about the right carotid bulb/proximal right ICA without significant stenosis. Right ICA patent distally without stenosis, dissection or occlusion. Left carotid system: Left CCA patent from its origin to the bifurcation without stenosis. Bulky calcified plaque about the left carotid bulb/proximal left ICA. Associated stenosis of up to 75% by NASCET criteria. Left ICA patent distally without stenosis, dissection or occlusion. Vertebral arteries: Both vertebral arteries arise from the subclavian arteries. No proximal  subclavian artery stenosis. Right vertebral artery slightly dominant. Atheromatous plaque at the origin of the right vertebral artery without high-grade stenosis. Vertebral  arteries otherwise widely patent within the neck without stenosis, dissection or occlusion. Skeleton: No acute osseous finding. No discrete or worrisome osseous lesions. Mild cervical spondylosis without significant stenosis. Other neck: No other acute soft tissue abnormality within the neck. No mass or adenopathy. 1.3 cm sebaceous cyst noted at the left posterior upper neck. Upper chest: Visualized upper chest demonstrates no acute finding. Centrilobular emphysematous changes noted. 4 mm left upper lobe nodule noted (series 5, image 171), indeterminate. Review of the MIP images confirms the above findings CTA HEAD FINDINGS Anterior circulation: Petrous segments widely patent. Advanced atheromatous change throughout the carotid siphons bilaterally, right worse than left. Associated moderate multifocal stenosis about the cavernous left ICA. More moderate to severe narrowing present at the cavernous right ICA. A1 segments patent. Normal anterior communicating artery complex. Anterior cerebral arteries patent to their distal aspects without stenosis. No M1 stenosis or occlusion. Normal MCA bifurcations. On the left, there is abrupt occlusion of a proximal left M3 branch, superior division (series 7, image 98). No other visible proximal left MCA branch occlusion. Right MCA branches well perfused. Posterior circulation: Atheromatous irregularity within the V4 segments bilaterally without significant stenosis. Both PICA origins patent and normal. Basilar patent to its distal aspect without stenosis. Superior cerebellar arteries patent bilaterally. Right PCA supplied via the basilar. Fetal type origin of the left PCA. Both PCAs perfused to their distal aspects without stenosis. Venous sinuses: Grossly patent allowing for timing the contrast bolus. Anatomic variants: Fetal type origin of the left PCA. No visible aneurysm. Review of the MIP images confirms the above findings IMPRESSION: 1. Positive CTA for emergent large  vessel occlusion, with abrupt occlusion of a proximal left M3 branch, superior division. 2. 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 3. 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 4. 4 mm left upper lobe nodule, indeterminate. No follow-up needed if patient is low-risk. Non-contrast chest CT can be considered in 12 months if patient is high-risk. This recommendation follows the consensus statement: Guidelines for Management of Incidental Pulmonary Nodules Detected on CT Images: From the Fleischner Society 2017; Radiology 2017; 284:228-243. 5. Aortic Atherosclerosis (ICD10-I70.0) and Emphysema (ICD10-J43.9). Critical Value/emergent results were called by telephone at the time of interpretation on 01/30/2021 at 4:00 am to provider [facility] , who verbally acknowledged these results. Electronically Signed   By: Rise Mu M.D.   On: 01/30/2021 04:32   CT ANGIO NECK CODE STROKE  Result Date: 01/30/2021 CLINICAL DATA:  Initial evaluation for acute stroke, aphasia, right-sided facial droop. EXAM: CT ANGIOGRAPHY HEAD AND NECK TECHNIQUE: Multidetector CT imaging of the head and neck was performed using the standard protocol during bolus administration of intravenous contrast. Multiplanar CT image reconstructions and MIPs were obtained to evaluate the vascular anatomy. Carotid stenosis measurements (when applicable) are obtained utilizing NASCET criteria, using the distal internal carotid diameter as the denominator. CONTRAST:  27mL OMNIPAQUE IOHEXOL 350 MG/ML SOLN COMPARISON:  Prior CT from earlier the same day. FINDINGS: CTA NECK FINDINGS Aortic arch: Visualized aortic arch normal in caliber with normal branch pattern. Moderate atheromatous change about the visualized arch and origin of the great vessels without hemodynamically significant stenosis. Right carotid system: Right  CCA patent from its origin to the bifurcation without stenosis. Scattered mixed plaque  about the right carotid bulb/proximal right ICA without significant stenosis. Right ICA patent distally without stenosis, dissection or occlusion. Left carotid system: Left CCA patent from its origin to the bifurcation without stenosis. Bulky calcified plaque about the left carotid bulb/proximal left ICA. Associated stenosis of up to 75% by NASCET criteria. Left ICA patent distally without stenosis, dissection or occlusion. Vertebral arteries: Both vertebral arteries arise from the subclavian arteries. No proximal subclavian artery stenosis. Right vertebral artery slightly dominant. Atheromatous plaque at the origin of the right vertebral artery without high-grade stenosis. Vertebral arteries otherwise widely patent within the neck without stenosis, dissection or occlusion. Skeleton: No acute osseous finding. No discrete or worrisome osseous lesions. Mild cervical spondylosis without significant stenosis. Other neck: No other acute soft tissue abnormality within the neck. No mass or adenopathy. 1.3 cm sebaceous cyst noted at the left posterior upper neck. Upper chest: Visualized upper chest demonstrates no acute finding. Centrilobular emphysematous changes noted. 4 mm left upper lobe nodule noted (series 5, image 171), indeterminate. Review of the MIP images confirms the above findings CTA HEAD FINDINGS Anterior circulation: Petrous segments widely patent. Advanced atheromatous change throughout the carotid siphons bilaterally, right worse than left. Associated moderate multifocal stenosis about the cavernous left ICA. More moderate to severe narrowing present at the cavernous right ICA. A1 segments patent. Normal anterior communicating artery complex. Anterior cerebral arteries patent to their distal aspects without stenosis. No M1 stenosis or occlusion. Normal MCA bifurcations. On the left, there is abrupt occlusion of a  proximal left M3 branch, superior division (series 7, image 98). No other visible proximal left MCA branch occlusion. Right MCA branches well perfused. Posterior circulation: Atheromatous irregularity within the V4 segments bilaterally without significant stenosis. Both PICA origins patent and normal. Basilar patent to its distal aspect without stenosis. Superior cerebellar arteries patent bilaterally. Right PCA supplied via the basilar. Fetal type origin of the left PCA. Both PCAs perfused to their distal aspects without stenosis. Venous sinuses: Grossly patent allowing for timing the contrast bolus. Anatomic variants: Fetal type origin of the left PCA. No visible aneurysm. Review of the MIP images confirms the above findings IMPRESSION: 1. Positive CTA for emergent large vessel occlusion, with abrupt occlusion of a proximal left M3 branch, superior division. 2. 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 3. 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 4. 4 mm left upper lobe nodule, indeterminate. No follow-up needed if patient is low-risk. Non-contrast chest CT can be considered in 12 months if patient is high-risk. This recommendation follows the consensus statement: Guidelines for Management of Incidental Pulmonary Nodules Detected on CT Images: From the Fleischner Society 2017; Radiology 2017; 284:228-243. 5. Aortic Atherosclerosis (ICD10-I70.0) and Emphysema (ICD10-J43.9). Critical Value/emergent results were called by telephone at the time of interpretation on 01/30/2021 at 4:00 am to provider [facility] , who verbally acknowledged these results. Electronically Signed   By: Jeannine Boga M.D.   On: 01/30/2021 04:32    Labs:  CBC: Recent Labs    01/27/21 1754 01/28/21 0431 01/28/21 1643 01/30/21 0827  WBC 11.4* 13.1* 14.0* 14.8*  HGB 14.8 14.0 13.7 14.4  HCT 44.2  41.7 40.6 43.5  PLT 213 166 172 192    COAGS: Recent Labs    01/27/21 1754  INR 1.0  APTT 29    BMP: Recent Labs    01/28/21 2345 01/30/21 0046 01/30/21 0827 01/31/21 0431  NA 138 138 136 138  K 3.5 4.2 4.5 4.0  CL 104 103 101 102  CO2 27 25 25 29   GLUCOSE 98 87 181* 127*  BUN 20 16 18 14   CALCIUM 8.9 9.2 8.8* 8.8*  CREATININE 1.18 1.13 1.09 0.95  GFRNONAA >60 >60 >60 >60    LIVER FUNCTION TESTS: Recent Labs    01/27/21 1754  BILITOT 0.7  AST 32  ALT 32  ALKPHOS 76  PROT 7.1  ALBUMIN 4.5    Assessment and Plan:  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 Earleen Newport. Patient's condition stable- awake and alert, follows simple commands, speech dysarthric, moving all extremities, right facial droop. Left femoral puncture site stable, distal pulses (DPs) dopplerable bilaterally. Ok to D/C bedrest. For MR brain today. Further plans per neurology- appreciate and agree with management. Please call IR with questions/concerns.   Electronically Signed: Earley Abide, PA-C 01/31/2021, 9:25 AM   I spent a total of 15 Minutes at the the patient's bedside AND on the patient's hospital floor or unit, greater than 50% of which was counseling/coordinating care for CVA s/p diagnostic cerebral arteriogram.

## 2021-01-31 NOTE — Progress Notes (Signed)
Carotid artery duplex completed. Refer to "CV Proc" under chart review to view preliminary results.  01/31/2021 12:22 PM Kelby Aline., MHA, RVT, RDCS, RDMS

## 2021-01-31 NOTE — Progress Notes (Signed)
SLP Cancellation Note  Patient Details Name: LUIE LANEVE MRN: [id number] DOB: 1946-09-01   Cancelled treatment:       Reason Eval/Treat Not Completed: Patient at procedure or test/unavailable (Pt working with OT at this time. SLP will f/u as schedule allows.)  Omega Slager I. Hardin Negus, [place], [place] Office number [phone] Pager [place] 01/31/2021, 5:54 PM

## 2021-01-31 NOTE — Evaluation (Signed)
Occupational Therapy Evaluation Patient Details Name: Garrett Hunter MRN: [id number] DOB: 18-Sep-1946 Today's Date: 01/31/2021    History of Present Illness Pt is a 75 y.o. male who presented 4/26 with sustained ventricular tachycradia and required cardioversion. 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 tPA was administered. S/p cerebral angiogram with attempted revascularization(unsuccessful secondary to tortuous vasculature- therefore procedure was aborted) via left femoral approach 4/28. PMH: MI, HTN, hepatitis A, CAD, CHF, AAA.   Clinical Impression   PT admitted with L MCA s/p tPa and cerebral angiogram with unsuccessful revascularization. Pt currently with functional limitiations due to the deficits listed below (see OT problem list). 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 Pt currently demonstrates dysarthria and decrease sensation at the R side of face. Pt with decreased recall speed to memory using writing the name of animals with spelling errors that are not baseline. Pt smiling and recognizing OT showing long term memory intact.  Pt will benefit from skilled OT to increase their independence and safety with adls and balance to allow discharge home.     Follow Up Recommendations  No OT follow up    Equipment Recommendations  None recommended by OT    Recommendations for Other Services Speech consult     Precautions / Restrictions Precautions Precautions: Fall Precaution Comments: A-line/ watch groining site per RN Restrictions Weight Bearing Restrictions: No      Mobility Bed Mobility Overal bed mobility: Modified Independent             General bed mobility comments: Pt able to transition supine >  sit EOB with HOB elevated safely.    Transfers Overall transfer level: Needs assistance Equipment used: None Transfers: Sit to/from Stand Sit to Stand: Supervision         General transfer comment: Pt able to come to stand in appropriate amount of time without LOB, supervision for safety.    Balance Overall balance assessment: Mild deficits observed, not formally tested                                         ADL either performed or assessed with clinical judgement   ADL Overall ADL's : Needs assistance/impaired Eating/Feeding: Set up   Grooming: Applying deodorant               Lower Body Dressing: Supervision/safety Lower Body Dressing Details (indicate cue type and reason): able to don socks sitting figure 4 crossing Toilet Transfer: Minimal assistance           Functional mobility during ADLs: Minimal assistance General ADL Comments: pt requires min (A) due to lines and leads first time up . pt progressed to min guard with time and attempts. pt complete sink level grooming. pt noted to have tooth paste on R side of mouth without awareness     Vision Baseline Vision/History: Wears glasses Wears Glasses: At all times Vision Assessment?: Yes Eye Alignment: Within Functional Limits Ocular Range of Motion: Within Functional Limits Alignment/Gaze Preference: Within Defined Limits Tracking/Visual Pursuits: Able to track stimulus in all quads without difficulty Additional Comments: with written task noted to have two errors in R visual  field so could benefit from further workup     Perception     Praxis      Pertinent Vitals/Pain Pain Assessment: No/denies pain     Hand Dominance Right   Extremity/Trunk Assessment Upper Extremity Assessment Upper Extremity Assessment: Overall WFL for tasks assessed   Lower Extremity Assessment Lower Extremity Assessment: Overall WFL for tasks assessed   Cervical / Trunk Assessment Cervical / Trunk  Assessment: Normal   Communication Communication Communication: No difficulties   Cognition Arousal/Alertness: Awake/alert Behavior During Therapy: WFL for tasks assessed/performed Overall Cognitive Status: Difficult to assess                                 General Comments: pt able to write 9 animals out on board in 1 minute with difficulty spelling the animals. pt able to draw a clock correctly with time. Visually scanning to eliminate letter with x2 errors ( star vs an A) in the R visual fields without catching errors   General Comments  VSS on RA with sats 94% so 4L O2 removed with RN notified. BP stable at this time.    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 Has two granddaughters 4 and 50 months old that motivate him      Prior Functioning/Environment Level of Independence: Independent        Comments: Pt driving. Pt assists his wife with work. Independent with all ADLs and functional mobility without AD/AE.        OT Problem List: Impaired balance (sitting and/or standing);Decreased cognition;Cardiopulmonary status limiting activity;Obesity      OT Treatment/Interventions: Self-care/ADL training;Therapeutic exercise;Neuromuscular education;Energy conservation;DME and/or AE instruction;Manual therapy;Therapeutic activities;Cognitive remediation/compensation;Visual/perceptual remediation/compensation;Patient/family  education;Balance training    OT Goals(Current goals can be found in the care plan section) Acute Rehab OT Goals Patient Stated Goal: to improve and speak OT Goal Formulation: With patient/family Time For Goal Achievement: 02/14/21 Potential to Achieve Goals: Good  OT Frequency: Min 3X/week   Barriers to D/C:            Co-evaluation PT/OT/SLP Co-Evaluation/Treatment: Yes Reason for Co-Treatment: For patient/therapist safety PT goals addressed during session: Mobility/safety with mobility;Balance OT goals addressed during session: ADL's and self-care;Proper use of Adaptive equipment and DME;Strengthening/ROM      AM-PAC OT "6 Clicks" Daily Activity     Outcome Measure Help from another person eating meals?: None Help from another person taking care of personal grooming?: A Little Help from another person toileting, which includes using toliet, bedpan, or urinal?: A Little Help from another person bathing (including washing, rinsing, drying)?: A Little Help from another person to put on and taking off regular upper body clothing?: None Help from another person to put on and taking off regular lower body clothing?: A Little 6 Click Score: 20   End of Session Equipment Utilized During Treatment: Gait belt Nurse Communication: Mobility status;Precautions  Activity Tolerance: Patient tolerated treatment well  Patient left: in chair;with call bell/phone within reach;with chair alarm set;with family/visitor present (wife and son Shanon Brow)  OT Visit Diagnosis: Unsteadiness on feet (R26.81);Muscle weakness (generalized) (M62.81)                Time: 5732-2025 OT Time Calculation (min): 36 min Charges:  OT General Charges $OT Visit: 1 Visit OT Evaluation $OT Eval Moderate Complexity: 1 Mod   Brynn, OTR/L  Acute Rehabilitation Services Pager: [phone] Office: [phone] .   Jeri Modena 01/31/2021, 2:50 PM

## 2021-01-31 NOTE — Progress Notes (Signed)
PT Cancellation Note  Patient Details Name: Garrett Hunter MRN: [id number] DOB: June 07, 1946   Cancelled Treatment:    Reason Eval/Treat Not Completed: Active bedrest order. Coordinated with RN to notify PT if bedrest orders are discharged later this date. Will follow-up as time permits.   Moishe Spice, PT, DPT Acute Rehabilitation Services  Pager: [phone] Office: [place] 01/31/2021, 8:06 AM

## 2021-01-31 NOTE — Progress Notes (Signed)
Physical Therapy Evaluation Patient Details Name: Garrett Hunter MRN: [id number] DOB: 05-Dec-1945 Today's Date: 01/31/2021   History of Present Illness  Pt is a 75 y.o. male who presented 4/26 with sustained ventricular tachycradia and required cardioversion. 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 tPA was administered. S/p cerebral angiogram with attempted revascularization(unsuccessful secondary to tortuous vasculature- therefore procedure was aborted) via left femoral approach 4/28. PMH: MI, HTN, hepatitis A, CAD, CHF, AAA.    Clinical Impression  Pt presents with condition above and deficits mentioned below, see PT Problem List. PTA, he was independent with all functional mobility and ADLs without AD/AE utilization. Pt lives with his wife in a house with 3 STE. Currently, pt is demonstrating some mild coordination and balance deficits impacting his mobility, but does not appear to be at risk for falls. His bil lower extremity strength and sensation are intact, WNL, and fairly symmetrical. Pt's primary deficit is his expressive aphasia. Will continue to follow acutely to maximize his independence and safety with all functional mobility but expect pt to progress well and not need PT follow-up at d/c.    Follow Up Recommendations No PT follow up    Equipment Recommendations  None recommended by PT    Recommendations for Other Services       Precautions / Restrictions Precautions Precautions: Fall Precaution Comments: A-line Restrictions Weight Bearing Restrictions: No      Mobility  Bed Mobility Overal bed mobility: Modified Independent             General bed mobility comments: Pt able to transition supine > sit EOB with HOB elevated safely.     Transfers Overall transfer level: Needs assistance Equipment used: None Transfers: Sit to/from Stand Sit to Stand: Supervision         General transfer comment: Pt able to come to stand in appropriate amount of time without LOB, supervision for safety.  Ambulation/Gait Ambulation/Gait assistance: Min guard Gait Distance (Feet): 50 Feet Assistive device: None Gait Pattern/deviations: Step-through pattern Gait velocity: reduced   General Gait Details: Pt with fairly steady gait, no LOB, negotiating tight spaces safely. Min guard for safety. Pt able to turn and stop quickly when cued.  Stairs            Wheelchair Mobility    Modified Rankin (Stroke Patients Only) Modified Rankin (Stroke Patients Only) Pre-Morbid Rankin Score: No symptoms Modified Rankin: Slight disability     Balance Overall balance assessment: Mild deficits observed, not formally tested                                           Pertinent Vitals/Pain Pain Assessment: No/denies pain    Home Living Family/patient expects to be discharged to:: Private residence Living Arrangements: Spouse/significant other Available Help at Discharge: Family;Available 24 hours/day Type of Home: House Home Access: Stairs to enter Entrance Stairs-Rails: None (wall On R ascending for support) Entrance Stairs-Number of Steps: 3 Home Layout: One level Home Equipment: None      Prior Function Level of Independence: Independent         Comments: Pt driving. Pt assists his wife with work. Independent with all ADLs and functional mobility without  AD/AE.     Hand Dominance   Dominant Hand: Right    Extremity/Trunk Assessment   Upper Extremity Assessment Upper Extremity Assessment: Defer to OT evaluation    Lower Extremity Assessment Lower Extremity Assessment: Overall WFL for tasks assessed (fairly symmetrical between legs; Gross bil MMT of 4+ to 5; denies numbness/tingling; possible  dysdiadochokinesia noted)    Cervical / Trunk Assessment Cervical / Trunk Assessment: Normal  Communication   Communication: No difficulties  Cognition Arousal/Alertness: Awake/alert Behavior During Therapy: WFL for tasks assessed/performed Overall Cognitive Status: Difficult to assess                                 General Comments: expressive deficits impacting cognitive assessment, but pt overall following directions and appropriate throughout session      General Comments General comments (skin integrity, edema, etc.): VSS on RA, a-line reading SBP up to 180s    Exercises     Assessment/Plan    PT Assessment Patient needs continued PT services  PT Problem List Decreased balance;Decreased coordination;Decreased mobility       PT Treatment Interventions DME instruction;Gait training;Stair training;Functional mobility training;Therapeutic activities;Therapeutic exercise;Balance training;Neuromuscular re-education;Patient/family education    PT Goals (Current goals can be found in the Care Plan section)  Acute Rehab PT Goals Patient Stated Goal: to improve and speak PT Goal Formulation: With patient/family Time For Goal Achievement: 02/14/21 Potential to Achieve Goals: Good    Frequency Min 3X/week   Barriers to discharge        Co-evaluation PT/OT/SLP Co-Evaluation/Treatment: Yes Reason for Co-Treatment: For patient/therapist safety;To address functional/ADL transfers PT goals addressed during session: Mobility/safety with mobility;Balance         AM-PAC PT "6 Clicks" Mobility  Outcome Measure Help needed turning from your back to your side while in a flat bed without using bedrails?: None Help needed moving from lying on your back to sitting on the side of a flat bed without using bedrails?: None Help needed moving to and from a bed to a chair (including a wheelchair)?: A Little Help needed standing up from a chair using your arms (e.g.,  wheelchair or bedside chair)?: A Little Help needed to walk in hospital room?: A Little Help needed climbing 3-5 steps with a railing? : A Little 6 Click Score: 20    End of Session Equipment Utilized During Treatment: Gait belt Activity Tolerance: Patient tolerated treatment well Patient left: in chair;with call bell/phone within reach;with chair alarm set Nurse Communication: Mobility status PT Visit Diagnosis: Other symptoms and signs involving the nervous system (R29.898)    Time: 3790-2409 PT Time Calculation (min) (ACUTE ONLY): 37 min   Charges:   PT Evaluation $PT Eval Low Complexity: 1 Low          Moishe Spice, PT, DPT Acute Rehabilitation Services  Pager: [phone] Office: [place] 01/31/2021, 1:34 PM

## 2021-01-31 NOTE — Progress Notes (Addendum)
STROKE TEAM PROGRESS NOTE   INTERVAL HISTORY His wife and son are at the bedside.  Patient able to say his own name today and attempts to say his wife and son's name. Patient continues to be able to say "yes".   EP were already following patient prior to CVA and saw patient yesterday and suggest that patient [place] may be the etiology behind his stroke.   Repeat CT head and MRI confirm a moderate size L MCA infarct.  Vitals:   01/31/21 0912 01/31/21 0915 01/31/21 0930 01/31/21 0945  BP: 136/68     Pulse: 79 76 67 71  Resp: 20 18 20 11   Temp:      TempSrc:      SpO2: 98% 97% 98% 98%  Weight:       CBC:  Recent Labs  Lab 01/28/21 1643 01/30/21 0827  WBC 14.0* 14.8*  HGB 13.7 14.4  HCT 40.6 43.5  MCV 98.1 100.2*  PLT 172 741   Basic Metabolic Panel:  Recent Labs  Lab 01/28/21 0431 01/28/21 1643 01/30/21 0046 01/30/21 0827 01/31/21 0431  NA 138   < > 138 136 138  K 3.7   < > 4.2 4.5 4.0  CL 100   < > 103 101 102  CO2 26   < > 25 25 29   GLUCOSE 120*   < > 87 181* 127*  BUN 26*   < > 16 18 14   CREATININE 1.39*   < > 1.13 1.09 0.95  CALCIUM 9.3   < > 9.2 8.8* 8.8*  MG 2.2  --  1.9  --   --   PHOS 4.4  --   --   --   --    < > = values in this interval not displayed.   Lipid Panel:  Recent Labs  Lab 01/30/21 0530  CHOL 119  TRIG 85  HDL 38*  CHOLHDL 3.1  VLDL 17  LDLCALC 64   HgbA1c:  Recent Labs  Lab 01/30/21 0046  HGBA1C 6.0*   Urine Drug Screen: No results for input(s): LABOPIA, COCAINSCRNUR, LABBENZ, AMPHETMU, THCU, LABBARB in the last 168 hours.  Alcohol Level No results for input(s): ETH in the last 168 hours.  IMAGING past 24 hours CT HEAD WO CONTRAST  Addendum Date: 01/31/2021   ADDENDUM REPORT: 01/31/2021 09:54 ADDENDUM: On second review, loss of gray-white differentiation consistent with acute/subacute infarct is noted in the left insula extending into the anterior left parietal lobe with cortical infarct noted in the postcentral gyrus.  Electronically Signed   By: Pedro Earls M.D.   On: 01/31/2021 09:54   Result Date: 01/31/2021 CLINICAL DATA:  Stroke, new onset headache following thrombolytic therapy EXAM: CT HEAD WITHOUT CONTRAST TECHNIQUE: Contiguous axial images were obtained from the base of the skull through the vertex without intravenous contrast. COMPARISON:  01/30/2021 at 3:46 a.m. FINDINGS: Brain: Normal anatomic configuration. Parenchymal volume loss is commensurate with the patient's age. Mild periventricular white matter changes are present likely reflecting the sequela of small vessel ischemia. Stable remote infarct within the right subinsular white matter. No abnormal intra or extra-axial mass lesion or fluid collection. No abnormal mass effect or midline shift. No evidence of acute intracranial hemorrhage or infarct. Ventricular size is normal. Cerebellum unremarkable. Vascular: No asymmetric hyperdense vasculature at the skull base. Advanced atherosclerotic calcification is seen within the carotid siphons bilaterally. Skull: Intact Sinuses/Orbits: Paranasal sinuses are clear. Orbits are unremarkable. Other: Mastoid air cells and middle ear  cavities are clear. IMPRESSION: No acute intracranial hemorrhage or infarct. 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 Electronically Signed: By: Fidela Salisbury MD On: 01/31/2021 02:08    PHYSICAL EXAM General: Elderly Caucasian male laying comfortably in bed; in no acute distress. HENT: Normal oropharynx and mucosa. Normal external appearance of ears and nose. Neck: Supple, no pain or tenderness CV:No JVD. No peripheral edema. Pulmonary:Symmetric Chest rise. Normal respiratory effort. Ext:No cyanosis, edema, or deformity Skin:No rash. Normal palpation of skin.  Musculoskeletal: Normal digits and nails by inspection. No clubbing.  Neurologic Examination  Mental status/Cognition:Awake, attempts to speak but vocal sounds are garbled and patient is  visibly frustrated. Can say his own name today but not much more is comprehensible.  Mild dysarthria.  Patient able to follow commands. Patient recognizes his wife and son, but is unable to say their names despite multiple attempts.  Cranial nerves: CN II Pupils 48mm equal and reactive to light, no VF deficits  CN III,IV,VI EOM intact, no gaze preference or deviation, no nystagmus  CN V Symmetric sensation to LT   CN VII  moderate right lower facial weakness.  CN VIII normal hearing to speech  CN IX &X symmetrical palate elevation  CN XI Good symmetrical shoulder shrug  CN XII  tongue protrusion towards the R   Motor: Muscle bulk:normal, tonenormal  4/5 BUE, good grip bilaterally 4/5 in BLE Great spontaneous movement of the extremities  Sensation: Intact to LT in all extremities.  Coordination/Complex Motor: No obvious ataxia.      ASSESSMENT/PLAN Mr. HAEDEN HUDOCK is a 75 y.o. male with history of HTN, HLD, prior MI, CAD, AAA, CHD, ICM who is currently admitted to CVICU for sustained Vtach with cardioversion to NSR and EP evaluation who had a CODE STROKE called after sudden status change with aphasia and R sided facial droop noted by RN. CT Head confirmed that the patient had a small L MCA infarct involving the L insula and frontal operculum. Patient received tPA. CTA Head noted an acute occlusion of the L MCA at M3 and patient had unsuccesful thrombectomy. Patient continues to remain aphasic. Based on the timeline of patient stroke it appears that patient LHC my have instigated a embolus that led to the event. Have spoken with EP and recommend a 30 day heart monitor as well. Imaging confirms that patient has a moderate size L MCA infarct correlating with patient's presentation.  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 Subtle  hypodensity involving the left insula and overlying left frontal operculum, suspicious for early acute left MCA distribution infarct. No intracranial hemorrhage. 2. ASPECTS is 8. 3. Age-related cerebral atrophy with mild chronic small vessel ischemic disease.  CTA head & neck  IMPRESSION: 1. Positive CTA for emergent large vessel occlusion, with abrupt occlusion of a proximal left M3 branch, superior division. 2. 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 3. 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 4. 4 mm left upper lobe nodule, indeterminate. No follow-up needed if patient is low-risk. Non-contrast chest CT can be considered in 12 months if patient is high-risk. This recommendation follows the consensus statement: Guidelines for Management of Incidental Pulmonary Nodules Detected on CT Images: From the Fleischner Society 2017; Radiology 2017; 284:228-243. 5. Aortic Atherosclerosis (ICD10-I70.0) and Emphysema (  ICD10-J43.9).   CT Head, repeat:  On second review, loss of gray-white differentiation consistent with acute/subacute infarct is noted in the left insula extending into the anterior left parietal lobe with cortical infarct noted in the postcentral gyrus.  MRI Moderate-sized acute left MCA infarct.   2D Echo recent echo, 4/26, LVEF 45-50% w/ LV golbal hypokinesis, mildly dilate LA, Trivial MVR, aortic dilatation,  Interartrial septum was not well visualized  Bilateral VAS US Carotid:  R ICA 1-39% and L ICA 40-59%  LDL 64  HgbA1c 6.0  VTE prophylaxis - SCDs        Diet   Diet NPO time specified         No antithrombotic prior to admission, now on aspirin 81 and Plavix daily mg daily. Recommend ASA + Plavix 3 weeks then ASA alone.  Will not initiate Eliquis as no A fib was noted with EP on board and there is significant suspicion  that the [place] instigated CVA  Recommend 30 day monitor  Therapy recommendations:  PT/ OT/ SLP  Disposition:  Pending  Recent VTach - Continue amiodarone 400mg  BID PO, per EP  Hypertension- improving Cleviprex off  Home Carvedilol 12.5mg  BID, Lisinopril 20mg    Permissive hypertension (OK if < 180/105) s/p thrombectomy but gradually normalize in 5-7 days  Long-term BP goal normotensive  Hyperlipidemia  Continue home Crestor 5mg   LDL 64, goal < 70  Continue statin at discharge    Other Stroke Risk Factors  Advanced Age >/= 23   Obesity, Body mass index is 31.89 kg/m., BMI >/= 30 associated with increased stroke risk, recommend weight loss, diet and exercise as appropriate   Coronary artery disease  Congestive heart failure   Hospital day # 3  Damita Dunnings, MD PGY-1 I have personally obtained history,examined this patient, reviewed notes, independently viewed imaging studies, participated in medical decision making and plan of care.ROS completed by me personally and pertinent positives fully documented  I have made any additions or clarifications directly to the above note. Agree with note above.  Patient continues to have severe expressive aphasia and repeat CT scan of the head today shows evolving left upper insular frontal infarct with nonhemorrhagic.  Recommend aspirin and Plavix for 3 weeks followed by aspirin alone and patient will need outpatient 30-day heart monitor after discharge for paroxysmal A. fib though the most likely etiology of stroke is cardiac catheterization procedure.  Mobilize out of bed.  Therapy consults.  Long discussion with patient, wife and son at the bedside and answered questions.  Transfer to neurology floor bed later when available.This patient is critically ill and at significant risk of neurological worsening, death and care requires constant monitoring of vital signs, hemodynamics,respiratory and cardiac monitoring, extensive review  of multiple databases, frequent neurological assessment, discussion with family, other specialists and medical decision making of high complexity.I have made any additions or clarifications directly to the above note.This critical care time does not reflect procedure time, or teaching time or supervisory time of PA/NP/Med Resident etc but could involve care discussion time.  I spent 30 minutes of neurocritical care time  in the care of  this patient.      Antony Contras, MD Medical Director [place] Pager: [phone] 01/31/2021 2:06 PM  To contact Stroke Continuity provider, please refer to [email or web address] After hours, contact General Neurology

## 2021-01-31 NOTE — Progress Notes (Addendum)
Progress Note  Patient Name: Garrett Hunter Date of Encounter: 01/31/2021  [facility] Cardiologist: [name]  Subjective   Just getting back in bed, family at bedside, facial drrop is much improved speech seems to be slowly improving, no CP or SOB  Inpatient Medications    Scheduled Meds:   stroke: mapping our early stages of recovery book   Does not apply Once   amiodarone  400 mg Oral BID   aspirin EC  81 mg Oral Daily   carvedilol  12.5 mg Oral BID WC   Chlorhexidine Gluconate Cloth  6 each Topical Daily   clopidogrel  75 mg Oral Daily   hydrochlorothiazide  12.5 mg Oral Daily   lisinopril  20 mg Oral Daily   pantoprazole sodium  40 mg Oral QHS   rosuvastatin  5 mg Oral Daily   sodium chloride flush  3 mL Intravenous Q12H   Continuous Infusions:  sodium chloride     sodium chloride     sodium chloride 50 mL/hr at 01/30/21 1805   clevidipine 2 mg/hr (01/31/21 0900)   PRN Meds: sodium chloride, acetaminophen, docusate sodium, hydrALAZINE, labetalol, ondansetron (ZOFRAN) IV, sodium chloride flush   Vital Signs    Vitals:   01/31/21 0912 01/31/21 0915 01/31/21 0930 01/31/21 0945  BP: 136/68     Pulse: 79 76 67 71  Resp: 20 18 20 11   Temp:      TempSrc:      SpO2: 98% 97% 98% 98%  Weight:        Intake/Output Summary (Last 24 hours) at 01/31/2021 1243 Last data filed at 01/31/2021 0900 Gross per 24 hour  Intake 332.25 ml  Output 1450 ml  Net -1117.75 ml   Last 3 Weights 01/29/2021 01/28/2021 01/28/2021  Weight (lbs) 228 lb 9.9 oz 228 lb 6.3 oz 228 lb 6.3 oz  Weight (kg) 103.7 kg 103.6 kg 103.6 kg  Some encounter information is confidential and restricted. Go to Review Flowsheets activity to see all data.      Telemetry    SR, occ PVCs, rare couplet, no VT - Personally Reviewed  ECG    No new EKGs - Personally Reviewed  Physical Exam    GEN: No acute distress.   Neck: No JVD Cardiac:  RRR, no murmurs, rubs, or gallops.  Respiratory: CTA  b/l. GI: Soft, nontender, non-distended  MS: No edema; No deformity. Neuro:  L facial droop, expressive aphasia, follows commands appropriately, answers yes/no appropriately Psych: Normal affect   Labs    High Sensitivity Troponin:   Recent Labs  Lab 01/28/21 0031 01/28/21 0255 01/28/21 0431 01/28/21 0640 01/28/21 0921  TROPONINIHS 652* 1,272* 1,509* 1,844* 2,162*      Chemistry Recent Labs  Lab 01/27/21 1754 01/28/21 0431 01/30/21 0046 01/30/21 0827 01/31/21 0431  NA 138   < > 138 136 138  K 4.3   < > 4.2 4.5 4.0  CL 101   < > 103 101 102  CO2 25   < > 25 25 29   GLUCOSE 173*   < > 87 181* 127*  BUN 23   < > 16 18 14   CREATININE 1.39*   < > 1.13 1.09 0.95  CALCIUM 9.9   < > 9.2 8.8* 8.8*  PROT 7.1  --   --   --   --   ALBUMIN 4.5  --   --   --   --   AST 32  --   --   --   --  ALT 32  --   --   --   --   ALKPHOS 76  --   --   --   --   BILITOT 0.7  --   --   --   --   GFRNONAA 54*   < > >60 >60 >60  ANIONGAP 12   < > 10 10 7    < > = values in this interval not displayed.     Hematology Recent Labs  Lab 01/28/21 0431 01/28/21 1643 01/30/21 0827  WBC 13.1* 14.0* 14.8*  RBC 4.25 4.14* 4.34  HGB 14.0 13.7 14.4  HCT 41.7 40.6 43.5  MCV 98.1 98.1 100.2*  MCH 32.9 33.1 33.2  MCHC 33.6 33.7 33.1  RDW 13.2 13.1 13.2  PLT 166 172 192    BNPNo results for input(s): BNP, PROBNP in the last 168 hours.   DDimer No results for input(s): DDIMER in the last 168 hours.   Radiology    CT HEAD CODE STROKE WO CONTRAST Result Date: 01/30/2021 CLINICAL DATA:  Code stroke. Initial evaluation for acute aphasia, right-sided facial droop EXAM: CT HEAD WITHOUT CONTRAST TECHNIQUE: Contiguous axial images were obtained from the base of the skull through the vertex without intravenous contrast. COMPARISON:  None available. FINDINGS: Brain: Age-related cerebral atrophy with mild chronic small vessel ischemic disease. No acute intracranial hemorrhage. There is question of subtle  hypodensity involving the left insula and overlying left frontal operculum, suspicious for possible early acute left MCA distribution infarct. No other visible acute large vessel territory infarct. No mass lesion, midline shift, or mass effect. No hydrocephalus or extra-axial fluid collection. Vascular: No visible hyperdense vessel. Calcified atherosclerosis present at skull base. Skull: Scalp soft tissues within normal limits.  Calvarium intact. Sinuses/Orbits: Globes and orbital soft tissues within normal limits. Patient status post bilateral ocular lens replacement. Paranasal sinuses are clear. No mastoid effusion. Other: None. ASPECTS [facility] Stroke Program Early CT Score) - Ganglionic level infarction (caudate, lentiform nuclei, internal capsule, insula, M1-M3 cortex): 6 - Supraganglionic infarction (M4-M6 cortex): 2 Total score (0-10 with 10 being normal): 8 IMPRESSION: 1. 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 No intracranial hemorrhage. 2. ASPECTS is 8. 3. Age-related cerebral atrophy with mild chronic small vessel ischemic disease. These results were communicated to Dr. Lorrin Goodell At 3:57 amon 4/28/2022by text page via the [facility] messaging system. Electronically Signed   By: Jeannine Boga M.D.   On: 01/30/2021 04:05    CT ANGIO HEAD CODE STROKE Result Date: 01/30/2021 CLINICAL DATA:  Initial evaluation for acute stroke, aphasia, right-sided facial droop. EXAM: CT ANGIOGRAPHY HEAD AND NECK TECHNIQUE: Multidetector CT imaging of the head and neck was performed using the standard protocol during bolus administration of intravenous contrast. Multiplanar CT image reconstructions and MIPs were obtained to evaluate the vascular anatomy. Carotid stenosis measurements (when applicable) are obtained utilizing NASCET criteria, using the distal internal carotid diameter as the denominator. CONTRAST:  31mL  OMNIPAQUE IOHEXOL 350 MG/ML SOLN COMPARISON:  Prior CT from earlier the same day. FINDINGS: CTA NECK FINDINGS Aortic arch: Visualized aortic arch normal in caliber with normal branch pattern. Moderate atheromatous change about the visualized arch and origin of the great vessels without hemodynamically significant stenosis. Right carotid system: Right CCA patent from its origin to the bifurcation without stenosis. Scattered mixed plaque about the right carotid bulb/proximal right ICA without significant stenosis. Right ICA patent distally without stenosis, dissection or occlusion.  Left carotid system: Left CCA patent from its origin to the bifurcation without stenosis. Bulky calcified plaque about the left carotid bulb/proximal left ICA. Associated stenosis of up to 75% by NASCET criteria. Left ICA patent distally without stenosis, dissection or occlusion. Vertebral arteries: Both vertebral arteries arise from the subclavian arteries. No proximal subclavian artery stenosis. Right vertebral artery slightly dominant. Atheromatous plaque at the origin of the right vertebral artery without high-grade stenosis. Vertebral arteries otherwise widely patent within the neck without stenosis, dissection or occlusion. Skeleton: No acute osseous finding. No discrete or worrisome osseous lesions. Mild cervical spondylosis without significant stenosis. Other neck: No other acute soft tissue abnormality within the neck. No mass or adenopathy. 1.3 cm sebaceous cyst noted at the left posterior upper neck. Upper chest: Visualized upper chest demonstrates no acute finding. Centrilobular emphysematous changes noted. 4 mm left upper lobe nodule noted (series 5, image 171), indeterminate. Review of the MIP images confirms the above findings CTA HEAD FINDINGS Anterior circulation: Petrous segments widely patent. Advanced atheromatous change throughout the carotid siphons bilaterally, right worse than left. Associated moderate multifocal  stenosis about the cavernous left ICA. More moderate to severe narrowing present at the cavernous right ICA. A1 segments patent. Normal anterior communicating artery complex. Anterior cerebral arteries patent to their distal aspects without stenosis. No M1 stenosis or occlusion. Normal MCA bifurcations. On the left, there is abrupt occlusion of a proximal left M3 branch, superior division (series 7, image 98). No other visible proximal left MCA branch occlusion. Right MCA branches well perfused. Posterior circulation: Atheromatous irregularity within the V4 segments bilaterally without significant stenosis. Both PICA origins patent and normal. Basilar patent to its distal aspect without stenosis. Superior cerebellar arteries patent bilaterally. Right PCA supplied via the basilar. Fetal type origin of the left PCA. Both PCAs perfused to their distal aspects without stenosis. Venous sinuses: Grossly patent allowing for timing the contrast bolus. Anatomic variants: Fetal type origin of the left PCA. No visible aneurysm. Review of the MIP images confirms the above findings IMPRESSION: 1. Positive CTA for emergent large vessel occlusion, with abrupt occlusion of a proximal left M3 branch, superior division. 2. 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 3. 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 4. 4 mm left upper lobe nodule, indeterminate. No follow-up needed if patient is low-risk. Non-contrast chest CT can be considered in 12 months if patient is high-risk. This recommendation follows the consensus statement: Guidelines for Management of Incidental Pulmonary Nodules Detected on CT Images: From the Fleischner Society 2017; Radiology 2017; 284:228-243. 5. Aortic Atherosclerosis (ICD10-I70.0) and Emphysema (ICD10-J43.9). Critical Value/emergent results were called by telephone at the time of  interpretation on 01/30/2021 at 4:00 am to provider [facility] , who verbally acknowledged these results. Electronically Signed   By: Jeannine Boga M.D.   On: 01/30/2021 04:32     Cardiac Studies    01/31/21: carotid US (prelim) Summary:  Right Carotid: Velocities in the right ICA are consistent with a 1-39%  stenosis.   Left Carotid: Velocities in the left ICA are consistent with a 40-59%  stenosis.   Vertebrals:  Bilateral vertebral arteries demonstrate antegrade flow.  Subclavians: Normal flow hemodynamics were seen in bilateral subclavian               arteries.    01/28/21: LHC Conclusions: Severe two-vessel coronary artery disease with chronic total occlusions of  the proximal LCx and RCA.  There are minimal luminal irregularities in the LAD without significant stenosis. Widely patent ramus intermedius stent. Moderately elevated left ventricular filling pressure.   Recommendations: Continue IV amiodarone and ICU monitoring in the setting of sustained ventricular tachycardia that is likely scar mediated. Gentle diuresis. Aggressive secondary prevention of coronary artery disease. EP consultation for further management of VT.       01/28/21: TTE IMPRESSIONS   1. Left ventricular ejection fraction, by estimation, is 45 to 50%. The  left ventricle has mildly decreased function. The left ventricle  demonstrates global hypokinesis. There is mild left ventricular  hypertrophy. Left ventricular diastolic parameters  are consistent with Grade II diastolic dysfunction (pseudonormalization).  There is akinesis of the left ventricular, basal inferior segment.   2. Right ventricular systolic function is normal. The right ventricular  size is normal.   3. Left atrial size was mildly dilated.   4. The mitral valve is grossly normal. Trivial mitral valve  regurgitation. No evidence of mitral stenosis.   5. The aortic valve was not well visualized. Aortic valve  regurgitation  is not visualized. No aortic stenosis is present.   6. Aortic dilatation noted. There is mild dilatation of the aortic root,  measuring 39 mm.   Patient Profile     75 y.o. male with a hx of CAD (dating back to 1996, last PCI was 2014), AAA s/p endovascular repair in 2018, HTN, HLD, ICM  He was in his USOH day of his admission to [facility], he had worked in the yard and when getting ready to shower and sudden onset of palpitations and some degree of chest discomfort unlike his anginal symptoms historically. He showered and ultimately became more symptomatic, weak, and called 911, found in WCT, given adenosine and amio with rhythm change. He was seen in the ER by [name] and cardioverted to SR, started on heparin gtt and amiodarone gtt   He had echo with LVEF 45-50%, grade II DD, and cath 01/28/21 with CTA of RCA and LCx, patent ramus stent and minimal luminal irregularities of the LAD, mod elevated LVEDP planned for gentle diuresis and transfer to [facility] for further management and EP evaluation.  Ar[facility] 01/28/21  Assessment & Plan    1.VT, symptomatic with progressive weakness and in the ER underwent cardioversion   LHC with severe CAD (CTO RCA and LCx), LVEF 45-50% Likely scar mediated. Home was on coreg 12.5mg  BID I do not see hx of arrhythmias or AAD in the past Planned for amiodarone tx and no ICD at this time  Continue amiodarone EP Sondi Desch see again Monday, please recall if needed sooner   2. CAD     Severe but stable     HS Trop felt to be demand 2/2 VT     No recurrent symptoms   3. HTN     As per neuro  4. ICM     Mild reduction in LVEF 45-50%     Not felt to be volume OL     On BB/ACE  5. Acute stroke yestrday morning, aphasic with R facial droop     new L MCA stroke     s/p tPA brought to IR       4mg  Integrilin at occluded artery, Severe tortuosity of vasculature, contributing to the delay in achieving first pass.  We elected to only try once  after the first pass, given the complicating features of his vasculature to avoid injury  No atrial arrhythmias by history or on telemetry ? Etiology, LHC? 01/28/21, not sure if timing is c/w a post procedural complication No arrhythmias/Afib by history or telemetry   Neurology team has assumed attending   For questions or updates, please contact [place] Please consult [email or web address] for contact info under        Signed, Baldwin Jamaica, PA-C  01/31/2021, 12:43 PM    I have seen and examined this patient with Tommye Standard.  Agree with above, note added to reflect my findings.  On exam, RRR, no murmurs.  Patient improved from yesterday.  Facial droop is better.  He has had no further ventricular arrhythmias.  We Vilas Edgerly continue amiodarone as above.  EP to see as needed through the weekend.  Clairessa Boulet M. Abygale Karpf MD 01/31/2021 3:13 PM

## 2021-02-01 DIAGNOSIS — I2510 Atherosclerotic heart disease of native coronary artery without angina pectoris: Secondary | ICD-10-CM

## 2021-02-01 DIAGNOSIS — I739 Peripheral vascular disease, unspecified: Secondary | ICD-10-CM

## 2021-02-01 DIAGNOSIS — E782 Mixed hyperlipidemia: Secondary | ICD-10-CM

## 2021-02-01 DIAGNOSIS — I63412 Cerebral infarction due to embolism of left middle cerebral artery: Secondary | ICD-10-CM

## 2021-02-01 DIAGNOSIS — E7800 Pure hypercholesterolemia, unspecified: Secondary | ICD-10-CM

## 2021-02-01 DIAGNOSIS — I6522 Occlusion and stenosis of left carotid artery: Secondary | ICD-10-CM

## 2021-02-01 LAB — BASIC METABOLIC PANEL
Anion gap: 9 (ref 5–15)
BUN: 19 mg/dL (ref 8–23)
CO2: 30 mmol/L (ref 22–32)
Calcium: 9.1 mg/dL (ref 8.9–10.3)
Chloride: 102 mmol/L (ref 98–111)
Creatinine, Ser: 1.15 mg/dL (ref 0.61–1.24)
GFR, Estimated: >60 mL/min (ref >60)
Glucose, Bld: 109 mg/dL — ABNORMAL HIGH (ref 70–99)
Potassium: 3.9 mmol/L (ref 3.5–5.1)
Sodium: 141 mmol/L (ref 135–145)

## 2021-02-01 LAB — CBC
HCT: 37.7 % — ABNORMAL LOW (ref 39.0–52.0)
Hemoglobin: 12.4 g/dL — ABNORMAL LOW (ref 13.0–17.0)
MCH: 33.3 pg (ref 26.0–34.0)
MCHC: 32.9 g/dL (ref 30.0–36.0)
MCV: 101.3 fL — ABNORMAL HIGH (ref 80.0–100.0)
Platelets: 143 10*3/uL — ABNORMAL LOW (ref 150–400)
RBC: 3.72 MIL/uL — ABNORMAL LOW (ref 4.22–5.81)
RDW: 13.2 % (ref 11.5–15.5)
WBC: 11.1 10*3/uL — ABNORMAL HIGH (ref 4.0–10.5)
nRBC: 0 % (ref 0.0–0.2)

## 2021-02-01 NOTE — H&P (View-Only) (Signed)
[facility] [place]  Patient name: Garrett Hunter MRN: [id number] DOB: 1946-08-13 Sex: male   REQUESTING PROVIDER:    Dr. Erlinda Hong   REASON FOR CONSULT:    Left carotid stenosis, ? symptomatic  HISTORY OF PRESENT ILLNESS:   Garrett Hunter is a 75 y.o. male, who presented to the emergency department on 01/27/2021 with chest pain.  He has a history of 3 prior MIs.  When he arrived he was in wide-complex tachycardia.  He was started on amiodarone.  He underwent urgent cardioversion with restoration of sinus rhythm.  This procedure was complicated by transient respiratory suppression.  He had elevated troponins and underwent cardiac catheterization on 01/28/2021.  On 01/30/2021, he was found to be aphasic with a right facial droop.  CT angio showed left MCA M2 occlusion.  He was given tPA.  He underwent attempted mechanical thrombectomy by neuroradiology which was unsuccessful.  Further stroke work-up with CT angiogram showed a 75% stenosis of the origin of the left ICA.  Vascular surgery consultation was requested.  The patient is still having difficulty with expressive aphasia and some difficulty with swallowing.  He does not endorse amaurosis fugax or motor or sensory dysfunction in either the upper or lower extremities.[name]  He is now on aspirin and Plavix as well as statin therapy.  The patient has a history of abdominal aortic aneurysm repaired at [facility].  He is also undergone lower extremity stenting for PAD.  He is medically managed for hypertension.  He is a former smoker.  PAST MEDICAL HISTORY    Past Medical History:  Diagnosis Date  . AAA (abdominal aortic aneurysm) ([place])    Small noted during cardiac catheterization in 2011.  [name] Benign neoplasm of colon   . CHF (congestive heart failure) (HCC)    EF 45-50%  . Chronic airway obstruction, not elsewhere classified   . Coronary artery disease    Inferior MI in 1996 treated  with TPA. Cardiac cath in 2011 at [facility] showed chronic occlusion of the proximal RCA and proximal left circumflex without obstructive disease in the LAD. Non-ST elevation myocardial infarction in October 2013 while on vacation in [place]. Cardiac catheterization showed plaque rupture in the proximal ramus with thrombus. He had balloon angioplasty done. 01/28/21 cath   . Coronary atherosclerosis of native coronary artery   . Hepatitis A    teenager  . Hyperlipidemia    Intolerance to statins.  . Hypertension   . MI (myocardial infarction) ([place])    x 3 - last one 2013     FAMILY HISTORY   Family History  Problem Relation Age of Onset  . Cancer Mother 66       stomach  . Heart attack Father   . Hypertension Sister   . Hypertension Brother     SOCIAL HISTORY:   Social History   Socioeconomic History  . Marital status: Married    Spouse name: Not on file  . Number of children: 4  . Years of education: Not on file  . Highest education level: Not on file  Occupational History  . Not on file  Tobacco Use  . Smoking status: Former Smoker    Packs/day: 2.00    Years: 40.00    Pack years: 80.00    Types: Cigarettes    Quit date: 2004    Years since quitting: 18.3  . Smokeless tobacco: Former Systems developer    Types: Secondary school teacher  . Vaping  Use: Never used  Substance and Sexual Activity  . Alcohol use: Yes    Alcohol/week: 3.0 standard drinks    Types: 3 Cans of beer per week    Comment: occassionally  . Drug use: No  . Sexual activity: Yes  Other Topics Concern  . Not on file  Social History Narrative   Lives at home with wife, works   Investment banker, operational of Radio broadcast assistant Strain: Not on file  Food Insecurity: Not on file  Transportation Needs: Not on file  Physical Activity: Not on file  Stress: Not on file  Social Connections: Not on file  Intimate Partner Violence: Not on file    ALLERGIES:    Allergies  Allergen Reactions  . Crestor  [Rosuvastatin]     Cramps. (Hight dose of Crestor)  . Livalo [Pitavastatin]     myalgia  . Pravachol [Pravastatin Sodium]     Cramps  . Zocor [Simvastatin]     Cramps    CURRENT MEDICATIONS:    Current Facility-Administered Medications  Medication Dose Route Frequency Provider Last Rate Last Admin  .  stroke: mapping our early stages of recovery book   Does not apply Once Donnetta Simpers, MD      . 0.9 %  sodium chloride infusion  250 mL Intravenous PRN Mickle Plumb, Jacquelyn D, PA-C      . 0.9 %  sodium chloride infusion   Intravenous Continuous Donnetta Simpers, MD      . acetaminophen (TYLENOL) tablet 650 mg  650 mg Oral Q4H PRN Mickle Plumb, Jacquelyn D, PA-C      . amiodarone (PACERONE) tablet 400 mg  400 mg Oral BID Baldwin Jamaica, PA-C   400 mg at 02/01/21 0938  . aspirin EC tablet 81 mg  81 mg Oral Daily Garvin Fila, MD   81 mg at 02/01/21 1829  . carvedilol (COREG) tablet 12.5 mg  12.5 mg Oral BID WC Baldwin Jamaica, PA-C   12.5 mg at 02/01/21 9371  . Chlorhexidine Gluconate Cloth 2 % PADS 6 each  6 each Topical Daily [name], MD   6 each at 02/01/21 1022  . clevidipine (CLEVIPREX) infusion 0.5 mg/mL  0-21 mg/hr Intravenous Continuous Donnetta Simpers, MD   Stopped at 01/31/21 [id number]  . clopidogrel (PLAVIX) tablet 75 mg  75 mg Oral Daily Garvin Fila, MD   75 mg at 02/01/21 8938  . docusate sodium (COLACE) capsule 100 mg  100 mg Oral BID PRN Mickle Plumb, Jacquelyn D, PA-C      . hydrALAZINE (APRESOLINE) injection 10 mg  10 mg Intravenous Q20 Min PRN Mickle Plumb, Jacquelyn D, PA-C      . hydrochlorothiazide (MICROZIDE) capsule 12.5 mg  12.5 mg Oral Daily Mickle Plumb, Jacquelyn D, PA-C   12.5 mg at 02/01/21 0815  . labetalol (NORMODYNE) injection 10 mg  10 mg Intravenous Q10 min PRN Marrianne Mood D, PA-C   10 mg at 02/01/21 0410  . lisinopril (ZESTRIL) tablet 20 mg  20 mg Oral Daily Marrianne Mood D, PA-C   20 mg at 02/01/21 1017  . ondansetron (ZOFRAN) injection 4 mg  4  mg Intravenous Q6H PRN Mickle Plumb, Jacquelyn D, PA-C      . pantoprazole sodium (PROTONIX) 40 mg/20 mL oral suspension 40 mg  40 mg Oral QHS Garvin Fila, MD   40 mg at 01/31/21 2109  . rosuvastatin (CRESTOR) tablet 5 mg  5 mg Oral Daily Visser, Jacquelyn D, PA-C   5 mg  at 02/01/21 0814  . sodium chloride flush (NS) 0.9 % injection 3 mL  3 mL Intravenous Q12H Visser, Jacquelyn D, PA-C   3 mL at 02/01/21 1029  . sodium chloride flush (NS) 0.9 % injection 3 mL  3 mL Intravenous PRN Mickle Plumb, Jacquelyn D, PA-C        REVIEW OF SYSTEMS:   [X]  denotes positive finding, [ ]  denotes negative finding Cardiac  Comments:  Chest pain or chest pressure:    Shortness of breath upon exertion:    Short of breath when lying flat:    Irregular heart rhythm:        Vascular    Pain in calf, thigh, or hip brought on by ambulation:    Pain in feet at night that wakes you up from your sleep:     Blood clot in your veins:    Leg swelling:         Pulmonary    Oxygen at home:    Productive cough:     Wheezing:         Neurologic    Sudden weakness in arms or legs:     Sudden numbness in arms or legs:     Sudden onset of difficulty speaking or slurred speech: x   Temporary loss of vision in one eye:     Problems with dizziness:         Gastrointestinal    Blood in stool:      Vomited blood:         Genitourinary    Burning when urinating:     Blood in urine:        Psychiatric    Major depression:         Hematologic    Bleeding problems:    Problems with blood clotting too easily:        Skin    Rashes or ulcers:        Constitutional    Fever or chills:     PHYSICAL EXAM:   Vitals:   02/01/21 0439 02/01/21 0833 02/01/21 0946 02/01/21 1221  BP: 130/85 (!) 177/77 138/66 139/74  Pulse: 63 71 69 66  Resp:  18 20 18   Temp: 98.5 F (36.9 C) 97.8 F (36.6 C)  98 F (36.7 C)  TempSrc: Oral Oral  Oral  SpO2: 97% 97% 97% 97%  Weight:        GENERAL: The patient is a  well-nourished male, in no acute distress. The vital signs are documented above. CARDIAC: There is a regular rate and rhythm.  PULMONARY: Nonlabored respirations ABDOMEN: Soft and non-tender MUSCULOSKELETAL: There are no major deformities or cyanosis. NEUROLOGIC: Good grip strength in both upper extremities as well as both lower extremities.  He has expressive aphasia SKIN: There are no ulcers or rashes noted. PSYCHIATRIC: The patient has a normal affect.  STUDIES:   I have reviewed the following: CT angiogram neck: 1. Positive CTA for emergent large vessel occlusion, with abrupt occlusion of a proximal left M3 branch, superior division. 2. 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 3. 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 4. 4 mm left upper lobe nodule, indeterminate. No follow-up needed if patient is low-risk. Non-contrast chest CT can be considered in 12 months if patient is high-risk. This recommendation follows the consensus statement: Guidelines for Management of Incidental Pulmonary Nodules Detected on  CT Images: From the Fleischner Society 2017; Radiology 2017; [phone].  MRI brain Moderate-sized acute left MCA infarct. ASSESSMENT and PLAN   Left brain stroke: Had a lengthy discussion with the patient and his family who was at the bedside.  The etiology of his stroke is unknown.  This certainly could be cardioembolic, however with approximately 75% stenosis of the left carotid artery, this could be playing a role as well.  We discussed other treatment options which include continuing medical therapy versus carotid endarterectomy versus TCAR.  I discussed with the family that in the setting of his recent cardiac issues, I feel the most appropriate modality would be TCAR.  I discussed the risk of the procedure including the risk of stroke as well as  bleeding.  He will need to be maintained on his aspirin and Plavix as well as a statin.  They wish to proceed.  I am placing him on the schedule for Friday.  He can be discharged home with outpatient speech therapy and return for his surgery.  Because of his recent cardiac issues I would like to touch base with cardiology to make sure they are okay with proceeding with surgery which will be done under general anesthesia.   Leia Alf, MD, FACS [facility] [phone] Pager [phone]

## 2021-02-01 NOTE — Progress Notes (Signed)
STROKE TEAM PROGRESS NOTE   INTERVAL HISTORY His wife and son are at the bedside.  Patient reclining in bed, moving all extremities, still has expressive aphasia.  MRI showed left MCA stroke.  CTA head and neck showed left ICA 75% stenosis, carotid Doppler left ICA 40 to 59% stenosis.  Vascular surgery consulted.  PT/OT no recs.   Vitals:   02/01/21 0439 02/01/21 0833 02/01/21 0946 02/01/21 1221  BP: 130/85 (!) 177/77 138/66 139/74  Pulse: 63 71 69 66  Resp:  18 20 18   Temp: 98.5 F (36.9 C) 97.8 F (36.6 C)  98 F (36.7 C)  TempSrc: Oral Oral  Oral  SpO2: 97% 97% 97% 97%  Weight:       CBC:  Recent Labs  Lab 01/30/21 0827 02/01/21 0318  WBC 14.8* 11.1*  HGB 14.4 12.4*  HCT 43.5 37.7*  MCV 100.2* 101.3*  PLT 192 161*   Basic Metabolic Panel:  Recent Labs  Lab 01/28/21 0431 01/28/21 1643 01/30/21 0046 01/30/21 0827 01/31/21 0431 02/01/21 0318  NA 138   < > 138   < > 138 141  K 3.7   < > 4.2   < > 4.0 3.9  CL 100   < > 103   < > 102 102  CO2 26   < > 25   < > 29 30  GLUCOSE 120*   < > 87   < > 127* 109*  BUN 26*   < > 16   < > 14 19  CREATININE 1.39*   < > 1.13   < > 0.95 1.15  CALCIUM 9.3   < > 9.2   < > 8.8* 9.1  MG 2.2  --  1.9  --   --   --   PHOS 4.4  --   --   --   --   --    < > = values in this interval not displayed.   Lipid Panel:  Recent Labs  Lab 01/30/21 0530  CHOL 119  TRIG 85  HDL 38*  CHOLHDL 3.1  VLDL 17  LDLCALC 64   HgbA1c:  Recent Labs  Lab 01/30/21 0046  HGBA1C 6.0*   Urine Drug Screen: No results for input(s): LABOPIA, COCAINSCRNUR, LABBENZ, AMPHETMU, THCU, LABBARB in the last 168 hours.  Alcohol Level No results for input(s): ETH in the last 168 hours.  IMAGING past 24 hours No results found.  PHYSICAL EXAM  Temp:  [97.8 F (36.6 C)-98.8 F (37.1 C)] 98 F (36.7 C) (04/30 1221) Pulse Rate:  [60-71] 66 (04/30 1221) Resp:  [15-20] 18 (04/30 1221) BP: (96-177)/(61-85) 139/74 (04/30 1221) SpO2:  [90 %-100 %] 97 %  (04/30 1221) Weight:  [101.1 kg] 101.1 kg (04/30 0409)  General - Well nourished, well developed, in no apparent distress.  Ophthalmologic - fundi not visualized due to noncooperation.  Cardiovascular - Regular rhythm and rate.  Mental Status -  Level of arousal and not able to answer orientation questions due to expressive aphasia.  However, orientated to place and people and time with " yes" and " no" answers over choices. Expressive aphasia, very limited speech output, able to name 1/4, able to repeat 3 word sentences.  However not able to repeat 5 word sentences.  Follows simple commands  Cranial Nerves II - XII - II - Visual field intact OU. III, IV, VI - Extraocular movements intact. V - Facial sensation intact bilaterally. VII - Right nasolabial fold flattening. VIII -  Hearing & vestibular intact bilaterally. X - Palate elevates symmetrically. XI - Chin turning & shoulder shrug intact bilaterally. XII - Tongue protrusion intact.  Motor Strength - The patient's strength was normal in all extremities and pronator drift was absent except slight weakness of right finger grip.  Bulk was normal and fasciculations were absent.   Motor Tone - Muscle tone was assessed at the neck and appendages and was normal.  Reflexes - The patient's reflexes were symmetrical in all extremities and he had no pathological reflexes.  Sensory - Light touch, temperature/pinprick were assessed and were symmetrical.    Coordination - The patient had normal movements in the hands with no ataxia or dysmetria.  Tremor was absent.  Gait and Station - deferred.   ASSESSMENT/PLAN [name] is a 75 y.o. male with history of HTN, HLD, prior MI, CAD, AAA, CHD, ICM who is currently admitted to CVICU for sustained Vtach with cardioversion to NSR and EP evaluation who had a CODE STROKE called after sudden status change with aphasia and R sided facial droop noted by RN. CT Head confirmed that the  patient had a small L MCA infarct involving the L insula and frontal operculum. Patient received tPA. CTA Head noted an acute occlusion of the L MCA at M3 and patient had unsuccesful thrombectomy. Patient continues to remain aphasic. Based on the timeline of patient stroke it appears that patient LHC my have instigated a embolus that led to the event. Have spoken with EP and recommend a 30 day heart monitor as well. Imaging confirms that patient has a moderate size L MCA infarct correlating with patient's presentation.  Stroke: Moderate L MCA infarct involving the L insula and frontal operculum, slightly cardioembolic with recent LHC vs. Left ICA high grade stenosis  Code Stroke : CT head subtle hypodensity involving the left insula and overlying left frontal operculum, suspicious for early acute left MCA distribution infarct.   CTA head & neck abrupt occlusion of a proximal left M3 branch, superior division. 75% atheromatous stenosis at the origin of the left ICA in the neck  IR confirmed M3 occlusion but unsuccessful one attempt of thrombectomy  CT head repeat consistent with acute/subacute infarct is noted in the left insula extending into the anterior left parietal lobe with cortical infarct noted in the postcentral gyrus.  MRI Moderate-sized acute left MCA infarct.  2D Echo LVEF 45-50% w/ LV golbal hypokinesis, mildly dilate LA  US Carotid:  R ICA 1-39% and L ICA 40-59%  Recommend 30 day monitor as outpt to rule out afib  LDL 64  HgbA1c 6.0  VTE prophylaxis - SCDs  No antithrombotic prior to admission, now on ASA + Plavix 3 months then ASA alone give left M2/M3 occlusion.  Therapy recommendations: HH SLP  Disposition:  Pending  Left carotid stenosis  CTA head & neck abrupt occlusion of a proximal left M3 branch, superior division. 75% atheromatous stenosis at the origin of the left ICA in the neck  US Carotid:  R ICA 1-39% and L ICA 40-59%  Vascular surgery Dr. Trula Slade  consulted  May consider TCAR next week  Need cardiology for clearance in am  Recent VTach  S/p cardioversion  Cardiology on board  Continue amiodarone 400mg  BID PO  Hypertension  Home Carvedilol 12.5mg  BID, Lisinopril 40mg  , HCTZ 25  Now on coreg and half dose of lisinopril with HCTZ  gradually normalize BP in 3-5 days  Long-term BP goal normotensive  Hyperlipidemia  Continue  home Crestor 5mg   LDL 64, goal < 70  Now on crestor 5  Continue statin at discharge  Other Stroke Risk Factors  Advanced Age >/= 40   Obesity, Body mass index is 31.89 kg/m., BMI >/= 30 associated with increased stroke risk, recommend weight loss, diet and exercise as appropriate   Coronary artery disease / MI  Congestive heart failure  AAA   Hospital day # 4  Rosalin Hawking, MD PhD Stroke Neurology 02/01/2021 3:33 PM  To contact Stroke Continuity provider, please refer to [email or web address] After hours, contact General Neurology

## 2021-02-01 NOTE — Progress Notes (Signed)
Patient is to transfer to 3 [place]. Report called to the receiving nurse.

## 2021-02-01 NOTE — Consult Note (Signed)
[facility] [place]  Patient name: Garrett Hunter MRN: [id number] DOB: 1946-08-13 Sex: male   REQUESTING PROVIDER:    Dr. Erlinda Hong   REASON FOR CONSULT:    Left carotid stenosis, ? symptomatic  HISTORY OF PRESENT ILLNESS:   Garrett Hunter is a 75 y.o. male, who presented to the emergency department on 01/27/2021 with chest pain.  He has a history of 3 prior MIs.  When he arrived he was in wide-complex tachycardia.  He was started on amiodarone.  He underwent urgent cardioversion with restoration of sinus rhythm.  This procedure was complicated by transient respiratory suppression.  He had elevated troponins and underwent cardiac catheterization on 01/28/2021.  On 01/30/2021, he was found to be aphasic with a right facial droop.  CT angio showed left MCA M2 occlusion.  He was given tPA.  He underwent attempted mechanical thrombectomy by neuroradiology which was unsuccessful.  Further stroke work-up with CT angiogram showed a 75% stenosis of the origin of the left ICA.  Vascular surgery consultation was requested.  The patient is still having difficulty with expressive aphasia and some difficulty with swallowing.  He does not endorse amaurosis fugax or motor or sensory dysfunction in either the upper or lower extremities.[name]  He is now on aspirin and Plavix as well as statin therapy.  The patient has a history of abdominal aortic aneurysm repaired at [facility].  He is also undergone lower extremity stenting for PAD.  He is medically managed for hypertension.  He is a former smoker.  PAST MEDICAL HISTORY    Past Medical History:  Diagnosis Date  . AAA (abdominal aortic aneurysm) ([place])    Small noted during cardiac catheterization in 2011.  [name] Benign neoplasm of colon   . CHF (congestive heart failure) (HCC)    EF 45-50%  . Chronic airway obstruction, not elsewhere classified   . Coronary artery disease    Inferior MI in 1996 treated  with TPA. Cardiac cath in 2011 at [facility] showed chronic occlusion of the proximal RCA and proximal left circumflex without obstructive disease in the LAD. Non-ST elevation myocardial infarction in October 2013 while on vacation in [place]. Cardiac catheterization showed plaque rupture in the proximal ramus with thrombus. He had balloon angioplasty done. 01/28/21 cath   . Coronary atherosclerosis of native coronary artery   . Hepatitis A    teenager  . Hyperlipidemia    Intolerance to statins.  . Hypertension   . MI (myocardial infarction) ([place])    x 3 - last one 2013     FAMILY HISTORY   Family History  Problem Relation Age of Onset  . Cancer Mother 66       stomach  . Heart attack Father   . Hypertension Sister   . Hypertension Brother     SOCIAL HISTORY:   Social History   Socioeconomic History  . Marital status: Married    Spouse name: Not on file  . Number of children: 4  . Years of education: Not on file  . Highest education level: Not on file  Occupational History  . Not on file  Tobacco Use  . Smoking status: Former Smoker    Packs/day: 2.00    Years: 40.00    Pack years: 80.00    Types: Cigarettes    Quit date: 2004    Years since quitting: 18.3  . Smokeless tobacco: Former Systems developer    Types: Secondary school teacher  . Vaping  Use: Never used  Substance and Sexual Activity  . Alcohol use: Yes    Alcohol/week: 3.0 standard drinks    Types: 3 Cans of beer per week    Comment: occassionally  . Drug use: No  . Sexual activity: Yes  Other Topics Concern  . Not on file  Social History Narrative   Lives at home with wife, works   Investment banker, operational of Radio broadcast assistant Strain: Not on file  Food Insecurity: Not on file  Transportation Needs: Not on file  Physical Activity: Not on file  Stress: Not on file  Social Connections: Not on file  Intimate Partner Violence: Not on file    ALLERGIES:    Allergies  Allergen Reactions  . Crestor  [Rosuvastatin]     Cramps. (Hight dose of Crestor)  . Livalo [Pitavastatin]     myalgia  . Pravachol [Pravastatin Sodium]     Cramps  . Zocor [Simvastatin]     Cramps    CURRENT MEDICATIONS:    Current Facility-Administered Medications  Medication Dose Route Frequency Provider Last Rate Last Admin  .  stroke: mapping our early stages of recovery book   Does not apply Once Donnetta Simpers, MD      . 0.9 %  sodium chloride infusion  250 mL Intravenous PRN Mickle Plumb, Jacquelyn D, PA-C      . 0.9 %  sodium chloride infusion   Intravenous Continuous Donnetta Simpers, MD      . acetaminophen (TYLENOL) tablet 650 mg  650 mg Oral Q4H PRN Mickle Plumb, Jacquelyn D, PA-C      . amiodarone (PACERONE) tablet 400 mg  400 mg Oral BID Baldwin Jamaica, PA-C   400 mg at 02/01/21 0938  . aspirin EC tablet 81 mg  81 mg Oral Daily Garvin Fila, MD   81 mg at 02/01/21 1829  . carvedilol (COREG) tablet 12.5 mg  12.5 mg Oral BID WC Baldwin Jamaica, PA-C   12.5 mg at 02/01/21 9371  . Chlorhexidine Gluconate Cloth 2 % PADS 6 each  6 each Topical Daily [name], MD   6 each at 02/01/21 1022  . clevidipine (CLEVIPREX) infusion 0.5 mg/mL  0-21 mg/hr Intravenous Continuous Donnetta Simpers, MD   Stopped at 01/31/21 [id number]  . clopidogrel (PLAVIX) tablet 75 mg  75 mg Oral Daily Garvin Fila, MD   75 mg at 02/01/21 8938  . docusate sodium (COLACE) capsule 100 mg  100 mg Oral BID PRN Mickle Plumb, Jacquelyn D, PA-C      . hydrALAZINE (APRESOLINE) injection 10 mg  10 mg Intravenous Q20 Min PRN Mickle Plumb, Jacquelyn D, PA-C      . hydrochlorothiazide (MICROZIDE) capsule 12.5 mg  12.5 mg Oral Daily Mickle Plumb, Jacquelyn D, PA-C   12.5 mg at 02/01/21 0815  . labetalol (NORMODYNE) injection 10 mg  10 mg Intravenous Q10 min PRN Marrianne Mood D, PA-C   10 mg at 02/01/21 0410  . lisinopril (ZESTRIL) tablet 20 mg  20 mg Oral Daily Marrianne Mood D, PA-C   20 mg at 02/01/21 1017  . ondansetron (ZOFRAN) injection 4 mg  4  mg Intravenous Q6H PRN Mickle Plumb, Jacquelyn D, PA-C      . pantoprazole sodium (PROTONIX) 40 mg/20 mL oral suspension 40 mg  40 mg Oral QHS Garvin Fila, MD   40 mg at 01/31/21 2109  . rosuvastatin (CRESTOR) tablet 5 mg  5 mg Oral Daily Visser, Jacquelyn D, PA-C   5 mg  at 02/01/21 0814  . sodium chloride flush (NS) 0.9 % injection 3 mL  3 mL Intravenous Q12H Visser, Jacquelyn D, PA-C   3 mL at 02/01/21 1029  . sodium chloride flush (NS) 0.9 % injection 3 mL  3 mL Intravenous PRN Mickle Plumb, Jacquelyn D, PA-C        REVIEW OF SYSTEMS:   [X]  denotes positive finding, [ ]  denotes negative finding Cardiac  Comments:  Chest pain or chest pressure:    Shortness of breath upon exertion:    Short of breath when lying flat:    Irregular heart rhythm:        Vascular    Pain in calf, thigh, or hip brought on by ambulation:    Pain in feet at night that wakes you up from your sleep:     Blood clot in your veins:    Leg swelling:         Pulmonary    Oxygen at home:    Productive cough:     Wheezing:         Neurologic    Sudden weakness in arms or legs:     Sudden numbness in arms or legs:     Sudden onset of difficulty speaking or slurred speech: x   Temporary loss of vision in one eye:     Problems with dizziness:         Gastrointestinal    Blood in stool:      Vomited blood:         Genitourinary    Burning when urinating:     Blood in urine:        Psychiatric    Major depression:         Hematologic    Bleeding problems:    Problems with blood clotting too easily:        Skin    Rashes or ulcers:        Constitutional    Fever or chills:     PHYSICAL EXAM:   Vitals:   02/01/21 0439 02/01/21 0833 02/01/21 0946 02/01/21 1221  BP: 130/85 (!) 177/77 138/66 139/74  Pulse: 63 71 69 66  Resp:  18 20 18   Temp: 98.5 F (36.9 C) 97.8 F (36.6 C)  98 F (36.7 C)  TempSrc: Oral Oral  Oral  SpO2: 97% 97% 97% 97%  Weight:        GENERAL: The patient is a  well-nourished male, in no acute distress. The vital signs are documented above. CARDIAC: There is a regular rate and rhythm.  PULMONARY: Nonlabored respirations ABDOMEN: Soft and non-tender MUSCULOSKELETAL: There are no major deformities or cyanosis. NEUROLOGIC: Good grip strength in both upper extremities as well as both lower extremities.  He has expressive aphasia SKIN: There are no ulcers or rashes noted. PSYCHIATRIC: The patient has a normal affect.  STUDIES:   I have reviewed the following: CT angiogram neck: 1. Positive CTA for emergent large vessel occlusion, with abrupt occlusion of a proximal left M3 branch, superior division. 2. 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 3. 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 4. 4 mm left upper lobe nodule, indeterminate. No follow-up needed if patient is low-risk. Non-contrast chest CT can be considered in 12 months if patient is high-risk. This recommendation follows the consensus statement: Guidelines for Management of Incidental Pulmonary Nodules Detected on  CT Images: From the Fleischner Society 2017; Radiology 2017; [phone].  MRI brain Moderate-sized acute left MCA infarct. ASSESSMENT and PLAN   Left brain stroke: Had a lengthy discussion with the patient and his family who was at the bedside.  The etiology of his stroke is unknown.  This certainly could be cardioembolic, however with approximately 75% stenosis of the left carotid artery, this could be playing a role as well.  We discussed other treatment options which include continuing medical therapy versus carotid endarterectomy versus TCAR.  I discussed with the family that in the setting of his recent cardiac issues, I feel the most appropriate modality would be TCAR.  I discussed the risk of the procedure including the risk of stroke as well as  bleeding.  He will need to be maintained on his aspirin and Plavix as well as a statin.  They wish to proceed.  I am placing him on the schedule for Friday.  He can be discharged home with outpatient speech therapy and return for his surgery.  Because of his recent cardiac issues I would like to touch base with cardiology to make sure they are okay with proceeding with surgery which will be done under general anesthesia.   Leia Alf, MD, FACS [facility] [phone] Pager [phone]

## 2021-02-02 ENCOUNTER — Other Ambulatory Visit: Payer: Self-pay | Admitting: Medical

## 2021-02-02 DIAGNOSIS — I5022 Chronic systolic (congestive) heart failure: Secondary | ICD-10-CM

## 2021-02-02 DIAGNOSIS — I6330 Cerebral infarction due to thrombosis of unspecified cerebral artery: Secondary | ICD-10-CM

## 2021-02-02 LAB — CBC
HCT: 36 % — ABNORMAL LOW (ref 39.0–52.0)
Hemoglobin: 12.1 g/dL — ABNORMAL LOW (ref 13.0–17.0)
MCH: 33.2 pg (ref 26.0–34.0)
MCHC: 33.6 g/dL (ref 30.0–36.0)
MCV: 98.6 fL (ref 80.0–100.0)
Platelets: 157 10*3/uL (ref 150–400)
RBC: 3.65 MIL/uL — ABNORMAL LOW (ref 4.22–5.81)
RDW: 13 % (ref 11.5–15.5)
WBC: 9.7 10*3/uL (ref 4.0–10.5)
nRBC: 0 % (ref 0.0–0.2)

## 2021-02-02 LAB — BASIC METABOLIC PANEL
Anion gap: 10 (ref 5–15)
BUN: 18 mg/dL (ref 8–23)
CO2: 26 mmol/L (ref 22–32)
Calcium: 9.3 mg/dL (ref 8.9–10.3)
Chloride: 102 mmol/L (ref 98–111)
Creatinine, Ser: 1.03 mg/dL (ref 0.61–1.24)
GFR, Estimated: >60 mL/min (ref >60)
Glucose, Bld: 99 mg/dL (ref 70–99)
Potassium: 3.7 mmol/L (ref 3.5–5.1)
Sodium: 138 mmol/L (ref 135–145)

## 2021-02-02 MED ORDER — HYDROCHLOROTHIAZIDE 25 MG PO TABS: 25.0000 mg | ORAL_TABLET | Freq: Every day | ORAL | Status: DC

## 2021-02-02 MED ORDER — AMIODARONE HCL 400 MG PO TABS: 400.0000 mg | ORAL_TABLET | Freq: Two times a day (BID) | ORAL | 0 refills | Status: DC

## 2021-02-02 MED ORDER — AMIODARONE HCL 200 MG PO TABS: 200.0000 mg | ORAL_TABLET | Freq: Every day | ORAL | 0 refills | Status: DC

## 2021-02-02 MED ORDER — LISINOPRIL 20 MG PO TABS: 40.0000 mg | ORAL_TABLET | Freq: Every day | ORAL | Status: DC

## 2021-02-02 MED ORDER — AMIODARONE HCL 200 MG PO TABS: 200.0000 mg | ORAL_TABLET | Freq: Every day | ORAL | Status: DC

## 2021-02-02 MED ORDER — AMIODARONE HCL 200 MG PO TABS
400.0000 mg | ORAL_TABLET | Freq: Every day | ORAL | Status: DC
Start: 2021-02-12 — End: 2021-02-02

## 2021-02-02 MED ORDER — CLOPIDOGREL BISULFATE 75 MG PO TABS: 75.0000 mg | ORAL_TABLET | Freq: Every day | ORAL | 2 refills | Status: AC

## 2021-02-02 MED ORDER — HYDROCHLOROTHIAZIDE 25 MG PO TABS: 25.0000 mg | ORAL_TABLET | Freq: Every day | ORAL | 1 refills | Status: DC

## 2021-02-02 MED ORDER — LISINOPRIL 20 MG PO TABS: 40.0000 mg | ORAL_TABLET | Freq: Every day | ORAL | 1 refills | Status: DC

## 2021-02-02 MED ORDER — AMIODARONE HCL 400 MG PO TABS: 400.0000 mg | ORAL_TABLET | Freq: Every day | ORAL | 0 refills | Status: DC

## 2021-02-02 NOTE — Discharge Summary (Incomplete)
Physician Discharge Summary  Patient ID: Garrett Hunter MRN: [id number] DOB/AGE: [birth year] 75 y.o.  Admit date: 01/28/2021 Discharge date: 02/02/2021  Admission Diagnoses:  Discharge Diagnoses:  Principal Problem:   Sustained VT (ventricular tachycardia) ([place]) Active Problems:   Coronary artery disease   Hyperlipidemia   Chronic systolic heart failure (HCC)   Sustained ventricular tachycardia (HCC)   Cerebral thrombosis with cerebral infarction   Cerebral embolism with cerebral infarction   Discharged Condition: {condition:18240}  Hospital Course: ***  Consults: {consultation:18241}  Significant Diagnostic Studies: {diagnostics:18242}  Treatments: {Tx:18249}  Discharge Exam: Blood pressure (!) 152/64, pulse 63, temperature 98.4 F (36.9 C), temperature source Oral, resp. rate 20, weight 101.1 kg, SpO2 98 %. {physical XIPJ:8250539}  Disposition: Discharge disposition: 01-Home or Self Care       Discharge Instructions    Ambulatory referral to Neurology   Complete by: As directed    Dr. Leonie Man requests follow up appointment in 2 months.     Allergies as of 02/02/2021      Reactions   Crestor [rosuvastatin]    Cramps. (Hight dose of Crestor)   Livalo [pitavastatin]    myalgia   Pravachol [pravastatin Sodium]    Cramps   Zocor [simvastatin]    Cramps      Medication List    STOP taking these medications   lisinopril-hydrochlorothiazide 20-12.5 MG tablet Commonly known as: ZESTORETIC     TAKE these medications   amiodarone 400 MG tablet Commonly known as: PACERONE Take 1 tablet (400 mg total) by mouth 2 (two) times daily for 10 days.   amiodarone 400 MG tablet Commonly known as: PACERONE Take 1 tablet (400 mg total) by mouth daily for 14 days. Start taking on: Feb 12, 2021   amiodarone 200 MG tablet Commonly known as: PACERONE Take 1 tablet (200 mg total) by mouth daily. Start taking on: Feb 26, 2021   aspirin EC 81 MG tablet Take 81 mg by  mouth daily. Swallow whole.   calcium-vitamin D 500-200 MG-UNIT tablet Commonly known as: OSCAL WITH D Take 1 tablet by mouth daily with breakfast.   carvedilol 12.5 MG tablet Commonly known as: COREG Take 12.5 mg by mouth 2 (two) times daily with a meal.   clopidogrel 75 MG tablet Commonly known as: PLAVIX Take 1 tablet (75 mg total) by mouth daily. Start taking on: Feb 03, 2021   COQ10 PO Take by mouth.   hydrochlorothiazide 25 MG tablet Commonly known as: HYDRODIURIL Take 1 tablet (25 mg total) by mouth daily. Start taking on: Feb 03, 2021   lisinopril 20 MG tablet Commonly known as: ZESTRIL Take 2 tablets (40 mg total) by mouth daily. Start taking on: Feb 03, 2021   multivitamin with minerals Tabs tablet Take 1 tablet by mouth daily.   rosuvastatin 5 MG tablet Commonly known as: CRESTOR Take 5 mg by mouth daily.   vitamin C 500 MG tablet Commonly known as: ASCORBIC ACID Take 500 mg by mouth daily.       Follow-up Information    [name], MD Follow up.   Specialties: Cardiology, Radiology Why: 02/19/21 @ 3:40PM Contact information: [place] [address] [address] [phone]        Derinda Late, MD .   Specialty: Family Medicine Contact information: [address] [facility] [place] [address] [phone]        [name], MD. Go to.   Specialties:  Vascular Surgery, Cardiology Why: Friday May 6th as instructed Contact information: [place] [address] [phone]        Garvin Fila, MD Follow up in 6 week(s).   Specialties: Neurology, Radiology Why: The office will call you Contact information: [address] [place] [address] [phone]               Signed: Dwaine Gale 02/02/2021, 2:16 PM

## 2021-02-02 NOTE — Discharge Summary (Addendum)
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 AAA (abdominal aortic aneurysm) ([place])    Small noted during cardiac catheterization in 2011.  [name] Benign neoplasm of colon   . CHF (congestive heart failure) ([facility])    EF 45-50%  . Chronic airway obstruction, not elsewhere classified   . Coronary artery disease    Inferior MI in 1996 treated with TPA. Cardiac cath in 2011 at [facility] showed chronic occlusion of the proximal RCA and proximal left circumflex without obstructive disease in the LAD. Non-ST elevation myocardial infarction in October 2013 while on vacation in [place]. Cardiac catheterization showed plaque rupture in the proximal ramus with thrombus. He had balloon angioplasty done. 01/28/21 cath   . Coronary atherosclerosis of native coronary artery   . Hepatitis A    teenager  . Hyperlipidemia    Intolerance to statins.  . Hypertension   . MI (myocardial infarction) ([place])    x 3 - last one 2013   Past Surgical History:  Procedure Laterality Date  . ABDOMINAL AORTIC ANEURYSM REPAIR     11/16/2016  . ANGIOPLASTY    . CARDIAC CATHETERIZATION  2011  .  CARDIAC CATHETERIZATION  2/14   [facility]; 95% lesion in the ramus intermedius.   [name] CATARACT EXTRACTION W/PHACO Left 12/19/2018   Procedure: CATARACT EXTRACTION PHACO AND INTRAOCULAR LENS PLACEMENT ([place])  LEFT;  Surgeon: Eulogio Bear, MD;  Location: [place];  Service: Ophthalmology;  Laterality: Left;  . CATARACT EXTRACTION W/PHACO Right 03/06/2019   Procedure: CATARACT EXTRACTION PHACO AND INTRAOCULAR LENS PLACEMENT (IOC)RIGHT;  Surgeon: Eulogio Bear, MD;  Location: [place];  Service: Ophthalmology;  Laterality: Right;  . CORONARY ANGIOPLASTY  1996   [facility] x1  . CORONARY ANGIOPLASTY  2/14   Severe restenosis in the ostial ramus. Status post angioplasty and drug-eluting stent placement with a 2.5 x 18 mm Xience drug-eluting stent  . ESOPHAGOGASTRODUODENOSCOPY N/A 08/03/2018   Procedure: ESOPHAGOGASTRODUODENOSCOPY (EGD);  Surgeon: Lin Landsman, MD;  Location: [facility] END[facility]COPY;  Service: Gastroenterology;  Laterality: N/A;  . ESOPHAGOGASTRODUODENOSCOPY (EGD) WITH PROPOFOL N/A 11/03/2018   Procedure: ESOPHAGOGASTRODUODENOSCOPY (EGD) WITH PROPOFOL;  Surgeon: Lin Landsman, MD;  Location: [facility] ENDOSCOPY;  Service: Gastroenterology;  Laterality: N/A;  . ESOPHAGOGASTRODUODENOSCOPY (EGD) WITH PROPOFOL N/A 12/06/2018   Procedure: ESOPHAGOGASTRODUODENOSCOPY (EGD) WITH PROPOFOL;  Surgeon: Lucilla Lame, MD;  Location: [facility] ENDOSCOPY;  Service: Endoscopy;  Laterality: N/A;  . IR CT HEAD LTD  01/30/2021  . IR PERCUTANEOUS ART THROMBECTOMY/INFUSION INTRACRANIAL INC DIAG ANGIO  01/30/2021  . LEFT HEART CATH AND CORONARY ANGIOGRAPHY N/A 01/28/2021   Procedure: LEFT HEART CATH AND  CORONARY ANGIOGRAPHY;  Surgeon: Nelva Bush, MD;  Location: [place] CV LAB;  Service: Cardiovascular;  Laterality: N/A;  . RADIOLOGY WITH ANESTHESIA N/A 01/30/2021   Procedure: IR WITH ANESTHESIA;  Surgeon: Luanne Bras, MD;  Location: [place];  Service: Radiology;  Laterality: N/A;  .  TONSILLECTOMY AND ADENOIDECTOMY      Family History Family History  Problem Relation Age of Onset  . Cancer Mother 27       stomach  . Heart attack Father   . Hypertension Sister   . Hypertension Brother     Social History  reports that he quit smoking about 18 years ago. His smoking use included cigarettes. He has a 80.00 pack-year smoking history. He has quit using smokeless tobacco.  His smokeless tobacco use included chew. He reports current alcohol use of about 3.0 standard drinks of alcohol per week. He reports that he does not use drugs.  Allergies as of 02/02/2021      Reactions   Crestor [rosuvastatin]    Cramps. (Hight dose of Crestor)   Livalo [pitavastatin]    myalgia   Pravachol [pravastatin Sodium]    Cramps   Zocor [simvastatin]    Cramps      Medication List    STOP taking these medications   lisinopril-hydrochlorothiazide 20-12.5 MG tablet Commonly known as: ZESTORETIC     TAKE these medications   amiodarone 400 MG tablet Commonly known as: PACERONE Take 1 tablet (400 mg total) by mouth 2 (two) times daily for 10 days.   amiodarone 400 MG tablet Commonly known as: PACERONE Take 1 tablet (400 mg total) by mouth daily for 14 days. Start taking on: Feb 12, 2021   amiodarone 200 MG tablet Commonly known as: PACERONE Take 1 tablet (200 mg total) by mouth daily. Start taking on: Feb 26, 2021   aspirin EC 81 MG tablet Take 81 mg by mouth daily. Swallow whole.   calcium-vitamin D 500-200 MG-UNIT tablet Commonly known as: OSCAL WITH D Take 1 tablet by mouth daily with breakfast.   carvedilol 12.5 MG tablet Commonly known as: COREG Take 12.5 mg by mouth 2 (two) times daily with a meal.   clopidogrel 75 MG tablet Commonly known as: PLAVIX Take 1 tablet (75 mg total) by mouth daily. Start taking on: Feb 03, 2021   COQ10 PO Take by mouth.   hydrochlorothiazide 25 MG tablet Commonly known as: HYDRODIURIL Take 1 tablet (25 mg total) by mouth  daily. Start taking on: Feb 03, 2021   lisinopril 20 MG tablet Commonly known as: ZESTRIL Take 2 tablets (40 mg total) by mouth daily. Start taking on: Feb 03, 2021   multivitamin with minerals Tabs tablet Take 1 tablet by mouth daily.   rosuvastatin 5 MG tablet Commonly known as: CRESTOR Take 5 mg by mouth daily.   vitamin C 500 MG tablet Commonly known as: ASCORBIC ACID Take 500 mg by mouth daily.       HOME MEDICATIONS PRIOR TO ADMISSION Medications Prior to Admission  Medication Sig Dispense Refill  . aspirin EC 81 MG tablet Take 81 mg by mouth daily. Swallow whole.    . calcium-vitamin D (OSCAL WITH D) 500-200 MG-UNIT tablet Take 1 tablet by mouth daily with breakfast.    . carvedilol (COREG) 12.5 MG tablet Take 12.5 mg by mouth 2 (two) times daily with a meal.    . Coenzyme Q10 (COQ10 PO) Take by mouth.    [name] lisinopril-hydrochlorothiazide (ZESTORETIC) 20-12.5 MG tablet Take  2 tablets by mouth daily.    . Multiple Vitamin (MULTIVITAMIN WITH MINERALS) TABS tablet Take 1 tablet by mouth daily.    . rosuvastatin (CRESTOR) 5 MG tablet Take 5 mg by mouth daily.    . vitamin C (ASCORBIC ACID) 500 MG tablet Take 500 mg by mouth daily.       HOSPITAL MEDICATIONS .  stroke: mapping our early stages of recovery book   Does not apply Once  . [START ON 02/26/2021] amiodarone  200 mg Oral Daily  . amiodarone  400 mg Oral BID  . [START ON 02/12/2021] amiodarone  400 mg Oral Daily  . aspirin EC  81 mg Oral Daily  . carvedilol  12.5 mg Oral BID WC  . Chlorhexidine Gluconate Cloth  6 each Topical Daily  . clopidogrel  75 mg Oral Daily  . [START ON 02/03/2021] hydrochlorothiazide  25 mg Oral Daily  . [START ON 02/03/2021] lisinopril  40 mg Oral Daily  . pantoprazole sodium  40 mg Oral QHS  . rosuvastatin  5 mg Oral Daily  . sodium chloride flush  3 mL Intravenous Q12H    LABORATORY STUDIES CBC    Component Value Date/Time   WBC 9.7 02/02/2021 0213   RBC 3.65 (L) 02/02/2021 0213    HGB 12.1 (L) 02/02/2021 0213   HGB 12.1 (L) 09/12/2018 1425   HCT 36.0 (L) 02/02/2021 0213   HCT 36.0 (L) 09/12/2018 1425   PLT 157 02/02/2021 0213   PLT 296 09/12/2018 1425   MCV 98.6 02/02/2021 0213   MCV 96 09/12/2018 1425   MCH 33.2 02/02/2021 0213   MCHC 33.6 02/02/2021 0213   RDW 13.0 02/02/2021 0213   RDW 13.1 09/12/2018 1425   LYMPHSABS 2.2 11/21/2012 1633   EOSABS 0.2 11/21/2012 1633   BASOSABS 0.0 11/21/2012 1633   CMP    Component Value Date/Time   NA 138 02/02/2021 0213   NA 141 11/25/2012 0405   K 3.7 02/02/2021 0213   K 4.6 11/25/2012 0405   CL 102 02/02/2021 0213   CL 106 11/25/2012 0405   CO2 26 02/02/2021 0213   CO2 29 11/25/2012 0405   GLUCOSE 99 02/02/2021 0213   GLUCOSE 99 11/25/2012 0405   BUN 18 02/02/2021 0213   BUN 13 11/25/2012 0405   CREATININE 1.03 02/02/2021 0213   CREATININE 0.92 11/25/2012 0405   CALCIUM 9.3 02/02/2021 0213   CALCIUM 9.1 11/25/2012 0405   PROT 7.1 01/27/2021 1754   PROT 6.4 09/13/2012 0819   ALBUMIN 4.5 01/27/2021 1754   ALBUMIN 4.5 09/13/2012 0819   AST 32 01/27/2021 1754   ALT 32 01/27/2021 1754   ALKPHOS 76 01/27/2021 1754   BILITOT 0.7 01/27/2021 1754   GFRNONAA >60 02/02/2021 0213   GFRNONAA >60 11/25/2012 0405   GFRAA >60 08/03/2018 0442   GFRAA >60 11/25/2012 0405   COAGS Lab Results  Component Value Date   INR 1.0 01/27/2021   INR 1.00 08/02/2018   INR 1.0 11/21/2012   Lipid Panel    Component Value Date/Time   CHOL 119 01/30/2021 0530   CHOL 121 09/13/2012 0819   TRIG 85 01/30/2021 0530   HDL 38 (L) 01/30/2021 0530   HDL 37 (L) 09/13/2012 0819   CHOLHDL 3.1 01/30/2021 0530   VLDL 17 01/30/2021 0530   LDLCALC 64 01/30/2021 0530   LDLCALC 68 09/13/2012 0819   HgbA1C  Lab Results  Component Value Date   HGBA1C 6.0 (H) 01/30/2021   Urinalysis No  results found for: COLORURINE, APPEARANCEUR, LABSPEC, [place], GLUCOSEU, HGBUR, BILIRUBINUR, KETONESUR, PROTEINUR, UROBILINOGEN, NITRITE,  LEUKOCYTESUR Urine Drug Screen No results found for: LABOPIA, COCAINSCRNUR, LABBENZ, AMPHETMU, THCU, LABBARB  Alcohol Level No results found for: ETH   SIGNIFICANT DIAGNOSTIC STUDIES   CT HEAD WO CONTRAST  Addendum Date: 01/31/2021   ADDENDUM REPORT: 01/31/2021 09:54 ADDENDUM: On second review, loss of gray-white differentiation consistent with acute/subacute infarct is noted in the left insula extending into the anterior left parietal lobe with cortical infarct noted in the postcentral gyrus. Electronically Signed   By: Pedro Earls M.D.   On: 01/31/2021 09:54   Result Date: 01/31/2021 CLINICAL DATA:  Stroke, new onset headache following thrombolytic therapy EXAM: CT HEAD WITHOUT CONTRAST TECHNIQUE: Contiguous axial images were obtained from the base of the skull through the vertex without intravenous contrast. COMPARISON:  01/30/2021 at 3:46 a.m. FINDINGS: Brain: Normal anatomic configuration. Parenchymal volume loss is commensurate with the patient's age. Mild periventricular white matter changes are present likely reflecting the sequela of small vessel ischemia. Stable remote infarct within the right subinsular white matter. No abnormal intra or extra-axial mass lesion or fluid collection. No abnormal mass effect or midline shift. No evidence of acute intracranial hemorrhage or infarct. Ventricular size is normal. Cerebellum unremarkable. Vascular: No asymmetric hyperdense vasculature at the skull base. Advanced atherosclerotic calcification is seen within the carotid siphons bilaterally. Skull: Intact Sinuses/Orbits: Paranasal sinuses are clear. Orbits are unremarkable. Other: Mastoid air cells and middle ear cavities are clear. IMPRESSION: No acute intracranial hemorrhage or infarct. 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 Electronically Signed: By: Fidela Salisbury MD On: 01/31/2021 02:08   MR BRAIN WO CONTRAST  Result Date: 01/31/2021 CLINICAL DATA:  Acute stroke  secondary to left M3 occlusion with unsuccessful attempted revascularization. EXAM: MRI HEAD WITHOUT CONTRAST TECHNIQUE: Multiplanar, multiecho pulse sequences of the brain and surrounding structures were obtained without intravenous contrast. COMPARISON:  Head CT 01/31/2021 FINDINGS: Brain: There is a moderate-sized acute left MCA infarct involving the insula and portions of the frontal and parietal lobes including opercular and perirolandic regions with minimal petechial hemorrhage. Separate from the dominant infarct are 2 adjacent punctate acute cortical infarcts posteriorly in the left parieto-occipital region. Scattered small foci of T2 hyperintensity in the cerebral white matter bilaterally are nonspecific but compatible with minimal chronic small vessel ischemic disease, less than is often seen in patients of this age. No mass, midline shift, or extra-axial fluid collection is identified. Mild cerebral atrophy is within normal limits for age. Vascular: Major intracranial vascular flow voids are preserved. Skull and upper cervical spine: Unremarkable bone marrow signal. Sinuses/Orbits: Bilateral cataract extraction. Trace right mastoid fluid. Trace ethmoid sinus mucosal thickening. Other: None. IMPRESSION: Moderate-sized acute left MCA infarct. Electronically Signed   By: Logan Bores M.D.   On: 01/31/2021 11:56   CARDIAC CATHETERIZATION  Result Date: 01/28/2021 Conclusions: 1. 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  There are minimal luminal irregularities in the LAD without significant stenosis. 2. Widely patent ramus intermedius stent. 3. Moderately elevated left ventricular filling pressure. Recommendations: 1. Continue IV amiodarone and ICU monitoring in the setting of sustained ventricular tachycardia that is likely scar mediated. 2. Gentle diuresis. 3. Aggressive secondary prevention of coronary artery disease. 4. EP consultation for  further management of VT. Nelva Bush, MD [facility]   IR [place]  Result Date: 01/30/2021 INDICATION: 75 year old male presents with acute right-sided systems secondary  to left MCA branch occlusion. He is currently in-hospital for treatment of cardiac issues. EXAM: ULTRASOUND-GUIDED ACCESS LEFT COMMON FEMORAL ARTERY MECHANICAL THROMBECTOMY LEFT MCA BRANCHES DEPLOYMENT OF ANGIO-SEAL FOR HEMOSTASIS COMPARISON:  Same day CT imaging MEDICATIONS: 4 MG intra arterial Integrilin ANESTHESIA/SEDATION: The anesthesia team was present to provide general endotracheal tube anesthesia and for patient monitoring during the procedure. Intubation was performed in negative pressure [place] in neuro IR holding. Left radial arterial line was performed by the anesthesia team. Interventional neuro radiology nursing staff was also present. CONTRAST:  130 cc Omnipaque FLUOROSCOPY TIME:  Fluoroscopy Time: 69 minutes 6 seconds (2,406 mGy). COMPLICATIONS: None TECHNIQUE: Informed written consent was obtained from the patient's family after a thorough discussion of the procedural risks, benefits and alternatives. Specific risks discussed include: Bleeding, infection, contrast reaction, kidney injury/failure, need for further procedure/surgery, arterial injury or dissection, embolization to new territory, intracranial hemorrhage (10-15% risk), neurologic deterioration, cardiopulmonary collapse, death. All questions were addressed. Maximal Sterile Barrier Technique was utilized including during the procedure including caps, mask, sterile gowns, sterile gloves, sterile drape, hand hygiene and skin antiseptic. A timeout was performed prior to the initiation of the procedure. The anesthesia team was present to provide general endotracheal tube anesthesia and for patient monitoring during the procedure. Interventional neuro radiology nursing staff was also present. FINDINGS: Initial Findings: Left common carotid artery: Normal course  caliber and contour. Calcified plaque at the left carotid bifurcation. Left external carotid artery: Patent with antegrade flow. Left internal carotid artery: Normal course caliber and contour of the cervical portion. Vertical and petrous segment patent with normal course caliber contour. Cavernous segment patent. Clinoid segment patent. Antegrade flow of the ophthalmic artery, with adequate choroidal blush. . Terminus patent. Fetal left PCA origin with a patent inflow of the posterior circulation contributing to some slip stream artifact of the flow into the left PCA. Left MCA: M1 segment is patent without significant atherosclerotic change. There is a 90 degree down turn of the distal M1 before the M2 division point. The inferior division of the MCA contributes to parietal flow and is the non dominant. The superior division contributes to the frontal and the majority of the parietal region, and is the dominant of the 2 branches. The occluded artery is a branch division of the superior division operculum branch, distal M2/proximal M3 with a corresponding absence of late arterial perfusion of the perisylvian region on the initial images. Significant tortuosity of the inferior and posterior division Left ACA: A 1 segment patent. A 2 segment perfuses the left territory. There is some cross flow through patent anterior communicating artery to the right hemisphere. Intraoperative findings: The tortuosity of the occluded superior branch was an obstacle for microcatheter placement. The majority of the 69 minutes of fluoroscopy utilized was the attempt of accessing this occluded branch. A nearly 360 loop was observed, which required several catheter and wire combinations. One pass was performed with the stent retriever after a successful catheter placement, exceeding 1 hour fluoroscopy time. Deployment of the 3 mm solitaire device was into the distal cortical branches crossing a small occlusive thrombus of the affected  artery. Given the physiologic response to hypertension after a single thrombectomy pass and the difficulty in navigating to this location, we elected not to proceed with a second attempt. Completion Findings: Left MCA: TICI 0: No perfusion or antegrade flow beyond site of occlusion, of the affected vessel. PROCEDURE: The anesthesia team was present to provide general endotracheal tube anesthesia and for patient monitoring  during the procedure. Intubation was performed in negative pressure [place] in neuro IR holding. Interventional neuro radiology nursing staff was also present. Ultrasound survey of the left inguinal region was performed with images stored and sent to PACs. 11 blade scalpel was used to make a small incision. Blunt dissection was performed with US guidance. A micropuncture needle was used access the left common femoral artery under ultrasound. With excellent arterial blood flow returned, an .018 micro wire was passed through the needle, observed to enter the abdominal aorta under fluoroscopy. The needle was removed, and a micropuncture sheath was placed over the wire. The inner dilator and wire were removed, and an 035 wire was advanced under fluoroscopy into the abdominal aorta. The sheath was removed and a 25cm 3F straight vascular sheath was placed. The dilator was removed and the sheath was flushed. Sheath was attached to pressurized and heparinized saline bag for constant forward flow. Initial angiogram was performed of the endovascular stent and left iliac system to assure that there was flow beyond the sheath, which was confirmed. A coaxial system was then advanced over the 035 wire through the sheath. This included a 95cm 087 "Walrus" balloon guide with coaxial 125cm Berenstein diagnostic catheter. This was advanced to the proximal descending thoracic aorta. Wire was then removed. Double flush of the catheter was performed. Catheter was then used to select the left common carotid artery.  Angiogram was performed. Using roadmap technique, the catheter was advanced over a standard glide wire into left cervical ICA, with distal position achieved of the balloon guide. The diagnostic catheter and the wire were removed. Double flush was then performed of the balloon guide. Formal angiogram was performed. Multiple obliquity and magnification views were performed to identify the paths of the occluded vessel. Road map function was used once the occluded vessel was identified. Copious back flush was performed and the balloon catheter was attached to heparinized and pressurized saline bag for forward flow. Given the size of the perceived vessel and the extreme tortuosity, we elected first to attempt a direct aspiration first strategy. A zoom 35 aspiration catheter was advanced through the balloon catheter with a coaxial synchro soft catheter. The zoom catheter was navigated into the superior division, although would not pass easily beyond the first division point. Given the significant resistance to advancing the aspiration catheter, we elected to abandon the strategy within aspiration catheter and attempt a stent retriever strategy with passage of only the microcatheter. Zoom 35 catheter was removed. We first used a Trevo Provue18 microcatheter and a synchro soft wire to navigate to the occlusion. The tortuosity of the system proved to be in obstacle 2 select the affected branch. We changed to an Aristotle microwire with the Trevo catheter. This was unsuccessful. We withdrew the microcatheter and exchanged for an angled headway Duo catheter. The synchro soft and the Aristotle wire were both used with the angled microcatheter. A double angled headliner wire 012 was attempted without success. The majority of the fluoroscopy time was necessary for these attempts of attempted catheter position. Given our sense that we would not be able to reach the occlusion for treatment, we elected to infuse 2 mg of intra  arterial Integrilin into the superior division at this time. Adequate flush was then performed. We did make an attempt for a final catheter position after the Integrilin infusion. Ultimately, on our final attempt, the angled headway duo catheter and a J curve 012 headliner wire selected the affected branch with the J wire  advancing just beyond the occlusion site. With the microcatheter in position in the proximal branch, the J wire would not pass distally. The J wire was then exchanged for the Aristotle wire, which we navigated into a distal cortical branch location. The microcatheter was then passed beyond the occlusion site. Wire was removed and aspiration blood was performed to confirm a luminal position. We then elected to attempt stent retriever thrombectomy. A 3 mm x 20 mm solitaire device was selected. A rotating hemostatic valve was then attached to the back end of the microcatheter, and a pressurized and heparinized saline bag was attached to the catheter. The device was inserted into the rotating hemostatic valve. Back flush was achieved at the rotating hemostatic valve, and then the device was gently advanced through the microcatheter to the distal end. The retriever was then unsheathed by withdrawing the microcatheter under fluoroscopy. Once the retriever was completely unsheathed, the microcatheter was carefully stripped from the delivery device. Once the device was completely open, a control angiogram was performed from the balloon guide. This identified a small filling defect centered within the length of the solitaire device, with flow through the solitaire device into the distal cortical branches of the affected territory. A 3 minute time interval was observed before the device was withdrawn. After 3 minutes, the balloon at the balloon guide catheter was then inflated under fluoroscopy for proximal flow arrest. Constant aspiration was then performed at the balloon guide, as the retriever was gently  and slowly withdrawn with fluoroscopic observation. Aspiration continued until the device was removed from the system. The Touhy was removed from the back end of the balloon guide and the device was retrieved. Free aspiration was confirmed at the hub of the balloon guide catheter, with free blood return confirmed. The balloon was then deflated, and a control angiogram was performed. After this initial pass, the artery remained occluded. The patient's blood pressure spike to 188 systolic with withdrawal of the catheter. Given the physiologic response, the location of the clot and the difficulty navigating to this site after 1 hour fluoroscopy time, we elected to withdraw from the case, so as not to commit any complication. Balloon guide was then removed. The skin at the puncture site was then cleaned with Chlorhexidine. Given the absence of stock of a suitable sized Angio-Seal, a 7 French sheath was placed at the puncture site on the J wire. 2 minutes observation with manual pressure was observed. The 7 French sheath was then removed and an 53F angioseal was deployed. Flat panel CT was performed. Patient was extubated once the CT was reviewed. Patient tolerated the procedure well. No complications were encountered and no significant blood loss encountered. IMPRESSION: Status post ultrasound-guided access left common femoral artery for left-sided cervical and cerebral angiogram and attempt mechanical thrombectomy of left opercular branch (M3) of the dominant superior division, with persisting TICI 0 flow of the affected branch after 1 pass solitaire stentreiver. Angio-Seal for hemostasis. Signed, Jaime S. Reyne DumasWagner, DO, RPVI [facility]: The patient was extubated. ICU status Target systolic blood pressure of less than 180, status post tPA Left hip straight time 6 hours Frequent neurovascular checks Repeat neurologic imaging with CT and/MRI at the discretion  of neurology team Electronically Signed   By: Gilmer MorJaime  Wagner D.O.   On: 01/30/2021 10:10   DG Chest Port 1 View  Result Date: 01/27/2021 CLINICAL DATA:  Chest pain and tachycardia EXAM: PORTABLE CHEST 1 VIEW COMPARISON:  November 21, 2012. FINDINGS: The fibrillator leads overlie the left chest. The heart size and mediastinal contours are within normal limits. Both lungs are clear. The visualized skeletal structures are unremarkable. IMPRESSION: No active disease. Electronically Signed   By: Maudry MayhewJeffrey  Waltz MD   On: 01/27/2021 18:36   ECHOCARDIOGRAM COMPLETE  Result Date: 01/28/2021    ECHOCARDIOGRAM REPORT   [facility] Date of Exam: 01/28/2021 Medical Rec #:  [id number]          Height:       71.0 in Accession #:    [id number]         Weight:       228.4 lb Date of Birth:  Jan 30, 1946          BSA:          2.231 m Patient Age:    74 years           BP:           142/77 mmHg Patient Gender: M                  HR:           64 bpm. Exam Location:  [facility] Procedure: 2D Echo, Cardiac Doppler and Color Doppler Indications:     Ventricular Tachycardia I47.2  History:         Patient has prior history of Echocardiogram examinations, most                  recent 10/07/2016. CHF, Previous Myocardial Infarction; Risk                  Factors:Hypertension.  Sonographer:     Cristela BlueJerry Hege RDCS (AE) Referring Phys:  [id number] BRITTON L [place] Diagnosing Phys: Cristal Deerhristopher End MD  Sonographer Comments: Suboptimal parasternal window and Technically challenging study due to limited acoustic windows. IMPRESSIONS  1. Left ventricular ejection fraction, by estimation, is 45 to 50%. The left ventricle has mildly decreased function. The left ventricle demonstrates global hypokinesis. There is mild left ventricular hypertrophy. Left ventricular diastolic parameters are consistent with Grade II diastolic dysfunction (pseudonormalization). There is akinesis of the left ventricular, basal inferior segment.  2. Right  ventricular systolic function is normal. The right ventricular size is normal.  3. Left atrial size was mildly dilated.  4. The mitral valve is grossly normal. Trivial mitral valve regurgitation. No evidence of mitral stenosis.  5. The aortic valve was not well visualized. Aortic valve regurgitation is not visualized. No aortic stenosis is present.  6. Aortic dilatation noted. There is mild dilatation of the aortic root, measuring 39 mm. FINDINGS  Left Ventricle: Left ventricular ejection fraction, by estimation, is 45 to 50%. The left ventricle has mildly decreased function. The left ventricle demonstrates global hypokinesis. The left ventricular internal cavity size was normal in size. There is  mild left ventricular hypertrophy. Left ventricular diastolic parameters are consistent with Grade II diastolic dysfunction (pseudonormalization). Right Ventricle: The right ventricular size is normal. No increase in right ventricular wall thickness. Right ventricular systolic function is normal. Left Atrium: Left atrial size was mildly dilated. Right Atrium: Right atrial size was normal in size. Pericardium: The pericardium was not well visualized. Mitral Valve: The mitral valve is grossly normal. Mild mitral annular calcification. Trivial mitral valve regurgitation. No evidence of mitral valve stenosis. Tricuspid Valve: The tricuspid valve is not well visualized. Tricuspid valve regurgitation is trivial. Aortic Valve: The aortic valve was not  well visualized. Aortic valve regurgitation is not visualized. No aortic stenosis is present. Aortic valve mean gradient measures 1.0 mmHg. Aortic valve peak gradient measures 2.1 mmHg. Aortic valve area, by VTI measures 3.20 cm. Pulmonic Valve: The pulmonic valve was not well visualized. Aorta: Aortic dilatation noted. There is mild dilatation of the aortic root, measuring 39 mm. IAS/Shunts: The interatrial septum was not well visualized.  LEFT VENTRICLE PLAX 2D LVIDd:          4.44 cm  Diastology LVIDs:         3.22 cm  LV e' medial:    4.13 cm/s LV PW:         1.33 cm  LV E/e' medial:  20.4 LV IVS:        1.01 cm  LV e' lateral:   5.66 cm/s LVOT diam:     2.30 cm  LV E/e' lateral: 14.9 LV SV:         53 LV SV Index:   24 LVOT Area:     4.15 cm  RIGHT VENTRICLE RV Basal diam:  2.86 cm RV S prime:     10.65 cm/s LEFT ATRIUM             Index       RIGHT ATRIUM           Index LA diam:        3.90 cm 1.75 cm/m  RA Area:     16.70 cm LA Vol (A2C):   88.2 ml 39.53 ml/m RA Volume:   43.20 ml  19.36 ml/m LA Vol (A4C):   75.7 ml 33.93 ml/m LA Biplane Vol: 82.5 ml 36.98 ml/m  AORTIC VALVE AV Area (Vmax):    3.45 cm AV Area (Vmean):   2.95 cm AV Area (VTI):     3.20 cm AV Vmax:           73.20 cm/s AV Vmean:          56.100 cm/s AV VTI:            0.166 m AV Peak Grad:      2.1 mmHg AV Mean Grad:      1.0 mmHg LVOT Vmax:         60.70 cm/s LVOT Vmean:        39.900 cm/s LVOT VTI:          0.128 m LVOT/AV VTI ratio: 0.77  AORTA Ao Root diam: 3.83 cm MITRAL VALVE               TRICUSPID VALVE MV Area (PHT): 5.79 cm    TR Peak grad:   7.1 mmHg MV Decel Time: 131 msec    TR Vmax:        133.00 cm/s MV E velocity: 84.40 cm/s MV A velocity: 42.80 cm/s  SHUNTS MV E/A ratio:  1.97        Systemic VTI:  0.13 m                            Systemic Diam: 2.30 cm Nelva Bush MD Electronically signed by Nelva Bush MD Signature Date/Time: 01/28/2021/10:32:38 AM    Final    IR PERCUTANEOUS ART THROMBECTOMY/INFUSION INTRACRANIAL INC DIAG ANGIO  Result Date: 01/30/2021 INDICATION: 75 year old male presents with acute right-sided 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 He is currently in-hospital for treatment of cardiac issues. EXAM: ULTRASOUND-GUIDED ACCESS LEFT COMMON FEMORAL ARTERY MECHANICAL  THROMBECTOMY LEFT MCA BRANCHES DEPLOYMENT OF ANGIO-SEAL FOR HEMOSTASIS COMPARISON:  Same day CT imaging MEDICATIONS: 4 MG intra arterial Integrilin ANESTHESIA/SEDATION: The anesthesia team was  present to provide general endotracheal tube anesthesia and for patient monitoring during the procedure. Intubation was performed in negative pressure [place] in neuro IR holding. Left radial arterial line was performed by the anesthesia team. Interventional neuro radiology nursing staff was also present. CONTRAST:  130 cc Omnipaque FLUOROSCOPY TIME:  Fluoroscopy Time: 69 minutes 6 seconds (2,406 mGy). COMPLICATIONS: None TECHNIQUE: Informed written consent was obtained from the patient's family after a thorough discussion of the procedural risks, benefits and alternatives. Specific risks discussed include: Bleeding, infection, contrast reaction, kidney injury/failure, need for further procedure/surgery, arterial injury or dissection, embolization to new territory, intracranial hemorrhage (10-15% risk), neurologic deterioration, cardiopulmonary collapse, death. All questions were addressed. Maximal Sterile Barrier Technique was utilized including during the procedure including caps, mask, sterile gowns, sterile gloves, sterile drape, hand hygiene and skin antiseptic. A timeout was performed prior to the initiation of the procedure. The anesthesia team was present to provide general endotracheal tube anesthesia and for patient monitoring during the procedure. Interventional neuro radiology nursing staff was also present. FINDINGS: Initial Findings: Left common carotid artery: Normal course caliber and contour. Calcified plaque at the left carotid bifurcation. Left external carotid artery: Patent with antegrade flow. Left internal carotid artery: Normal course caliber and contour of the cervical portion. Vertical and petrous segment patent with normal course caliber contour. Cavernous segment patent. Clinoid segment patent. Antegrade flow of the ophthalmic artery, with adequate choroidal blush. . Terminus patent. Fetal left PCA origin with a patent inflow of the posterior circulation contributing to some slip stream  artifact of the flow into the left PCA. Left MCA: M1 segment is patent without significant atherosclerotic change. There is a 90 degree down turn of the distal M1 before the M2 division point. The inferior division of the MCA contributes to parietal flow and is the non dominant. The superior division contributes to the frontal and the majority of the parietal region, and is the dominant of the 2 branches. The occluded artery is a branch division of the superior division operculum branch, distal M2/proximal M3 with a corresponding absence of late arterial perfusion of the perisylvian region on the initial images. Significant tortuosity of the inferior and posterior division Left ACA: A 1 segment patent. A 2 segment perfuses the left territory. There is some cross flow through patent anterior communicating artery to the right hemisphere. Intraoperative findings: The tortuosity of the occluded superior branch was an obstacle for microcatheter placement. The majority of the 69 minutes of fluoroscopy utilized was the attempt of accessing this occluded branch. A nearly 360 loop was observed, which required several catheter and wire combinations. One pass was performed with the stent retriever after a successful catheter placement, exceeding 1 hour fluoroscopy time. Deployment of the 3 mm solitaire device was into the distal cortical branches crossing a small occlusive thrombus of the affected artery. Given the physiologic response to hypertension after a single thrombectomy pass and the difficulty in navigating to this location, we elected not to proceed with a second attempt. Completion Findings: Left MCA: TICI 0: No perfusion or antegrade flow beyond site of occlusion, of the affected vessel. PROCEDURE: The anesthesia team was present to provide general endotracheal tube anesthesia and for patient monitoring during the procedure. Intubation was performed in negative pressure Bay in neuro IR holding. Interventional neuro  radiology nursing staff was also  present. Ultrasound survey of the left inguinal region was performed with images stored and sent to PACs. 11 blade scalpel was used to make a small incision. Blunt dissection was performed with US guidance. A micropuncture needle was used access the left common femoral artery under ultrasound. With excellent arterial blood flow returned, an .018 micro wire was passed through the needle, observed to enter the abdominal aorta under fluoroscopy. The needle was removed, and a micropuncture sheath was placed over the wire. The inner dilator and wire were removed, and an 035 wire was advanced under fluoroscopy into the abdominal aorta. The sheath was removed and a 25cm 3F straight vascular sheath was placed. The dilator was removed and the sheath was flushed. Sheath was attached to pressurized and heparinized saline bag for constant forward flow. Initial angiogram was performed of the endovascular stent and left iliac system to assure that there was flow beyond the sheath, which was confirmed. A coaxial system was then advanced over the 035 wire through the sheath. This included a 95cm 087 "Walrus" balloon guide with coaxial 125cm Berenstein diagnostic catheter. This was advanced to the proximal descending thoracic aorta. Wire was then removed. Double flush of the catheter was performed. Catheter was then used to select the left common carotid artery. Angiogram was performed. Using roadmap technique, the catheter was advanced over a standard glide wire into left cervical ICA, with distal position achieved of the balloon guide. The diagnostic catheter and the wire were removed. Double flush was then performed of the balloon guide. Formal angiogram was performed. Multiple obliquity and magnification views were performed to identify the paths of the occluded vessel. Road map function was used once the occluded vessel was identified. Copious back flush was performed and the balloon catheter  was attached to heparinized and pressurized saline bag for forward flow. Given the size of the perceived vessel and the extreme tortuosity, we elected first to attempt a direct aspiration first strategy. A zoom 35 aspiration catheter was advanced through the balloon catheter with a coaxial synchro soft catheter. The zoom catheter was navigated into the superior division, although would not pass easily beyond the first division point. Given the significant resistance to advancing the aspiration catheter, we elected to abandon the strategy within aspiration catheter and attempt a stent retriever strategy with passage of only the microcatheter. Zoom 35 catheter was removed. We first used a Trevo Provue18 microcatheter and a synchro soft wire to navigate to the occlusion. The tortuosity of the system proved to be in obstacle 2 select the affected branch. We changed to an Aristotle microwire with the Trevo catheter. This was unsuccessful. We withdrew the microcatheter and exchanged for an angled headway Duo catheter. The synchro soft and the Aristotle wire were both used with the angled microcatheter. A double angled headliner wire 012 was attempted without success. The majority of the fluoroscopy time was necessary for these attempts of attempted catheter position. Given our sense that we would not be able to reach the occlusion for treatment, we elected to infuse 2 mg of intra arterial Integrilin into the superior division at this time. Adequate flush was then performed. We did make an attempt for a final catheter position after the Integrilin infusion. Ultimately, on our final attempt, the angled headway duo catheter and a J curve 012 headliner wire selected the affected branch with the J wire advancing just beyond the occlusion site. With the microcatheter in position in the proximal branch, the J wire would not pass distally.  The J wire was then exchanged for the Aristotle wire, which we navigated into a distal  cortical branch location. The microcatheter was then passed beyond the occlusion site. Wire was removed and aspiration blood was performed to confirm a luminal position. We then elected to attempt stent retriever thrombectomy. A 3 mm x 20 mm solitaire device was selected. A rotating hemostatic valve was then attached to the back end of the microcatheter, and a pressurized and heparinized saline bag was attached to the catheter. The device was inserted into the rotating hemostatic valve. Back flush was achieved at the rotating hemostatic valve, and then the device was gently advanced through the microcatheter to the distal end. The retriever was then unsheathed by withdrawing the microcatheter under fluoroscopy. Once the retriever was completely unsheathed, the microcatheter was carefully stripped from the delivery device. Once the device was completely open, a control angiogram was performed from the balloon guide. This identified a small filling defect centered within the length of the solitaire device, with flow through the solitaire device into the distal cortical branches of the affected territory. A 3 minute time interval was observed before the device was withdrawn. After 3 minutes, the balloon at the balloon guide catheter was then inflated under fluoroscopy for proximal flow arrest. Constant aspiration was then performed at the balloon guide, as the retriever was gently and slowly withdrawn with fluoroscopic observation. Aspiration continued until the device was removed from the system. The Touhy was removed from the back end of the balloon guide and the device was retrieved. Free aspiration was confirmed at the hub of the balloon guide catheter, with free blood return confirmed. The balloon was then deflated, and a control angiogram was performed. After this initial pass, the artery remained occluded. The patient's blood pressure spike to 0000000 systolic with withdrawal of the catheter. Given the physiologic  response, the location of the clot and the difficulty navigating to this site after 1 hour fluoroscopy time, we elected to withdraw from the case, so as not to commit any complication. Balloon guide was then removed. The skin at the puncture site was then cleaned with Chlorhexidine. Given the absence of stock of a suitable sized Angio-Seal, a 7 French sheath was placed at the puncture site on the J wire. 2 minutes observation with manual pressure was observed. The 7 French sheath was then removed and an 65F angioseal was deployed. Flat panel CT was performed. Patient was extubated once the CT was reviewed. Patient tolerated the procedure well. No complications were encountered and no significant blood loss encountered. IMPRESSION: Status post ultrasound-guided access left common femoral artery for left-sided cervical and cerebral angiogram and attempt mechanical thrombectomy of left opercular branch (M3) of the dominant superior division, with persisting TICI 0 flow of the affected branch after 1 pass solitaire stentreiver. Angio-Seal for hemostasis. Signed, Dulcy Fanny. Dellia Nims, RPVI [facility]ascular and [facility] Radiology PLAN: The patient was extubated. ICU status Target systolic blood pressure of less than 180, status post tPA Left hip straight time 6 hours Frequent neurovascular checks Repeat neurologic imaging with CT and/MRI at the discretion of neurology team Electronically Signed   By: Corrie Mckusick D.O.   On: 01/30/2021 10:10   CT HEAD CODE STROKE WO CONTRAST  Result Date: 01/30/2021 CLINICAL DATA:  Code stroke. Initial evaluation for acute aphasia, right-sided facial droop EXAM: CT HEAD WITHOUT CONTRAST TECHNIQUE: Contiguous axial images were obtained from the base of the skull through the vertex without intravenous contrast.  COMPARISON:  None available. FINDINGS: Brain: Age-related cerebral atrophy with mild chronic small vessel ischemic disease. No acute intracranial  hemorrhage. There is question of subtle hypodensity involving the left insula and overlying left frontal operculum, suspicious for possible early acute left MCA distribution infarct. No other visible acute large vessel territory infarct. No mass lesion, midline shift, or mass effect. No hydrocephalus or extra-axial fluid collection. Vascular: No visible hyperdense vessel. Calcified atherosclerosis present at skull base. Skull: Scalp soft tissues within normal limits.  Calvarium intact. Sinuses/Orbits: Globes and orbital soft tissues within normal limits. Patient status post bilateral ocular lens replacement. Paranasal sinuses are clear. No mastoid effusion. Other: None. ASPECTS [facility] Stroke Program Early CT Score) - Ganglionic level infarction (caudate, lentiform nuclei, internal capsule, insula, M1-M3 cortex): 6 - Supraganglionic infarction (M4-M6 cortex): 2 Total score (0-10 with 10 being normal): 8 IMPRESSION: 1. 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 No intracranial hemorrhage. 2. ASPECTS is 8. 3. Age-related cerebral atrophy with mild chronic small vessel ischemic disease. These results were communicated to Dr. Lorrin Goodell At 3:57 amon 4/28/2022by text page via the [facility] messaging system. Electronically Signed   By: Jeannine Boga M.D.   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 Risk Factors:      Hypertension,  hyperlipidemia, coronary artery disease. Comparison Study:  No prior study Performing Technologist: Maudry Mayhew MHA, RDMS, RVT, RDCS  Examination Guidelines: A complete evaluation includes B-mode imaging, spectral Doppler, color Doppler, and power Doppler as needed of all accessible portions of each vessel. Bilateral testing is considered an integral part of a complete examination. Limited examinations for reoccurring indications may be performed as noted.  Right Carotid Findings: +----------+--------+--------+--------+-------------------------+--------+           PSV cm/sEDV cm/sStenosisPlaque Description       Comments +----------+--------+--------+--------+-------------------------+--------+ CCA Prox  83      17                                                +----------+--------+--------+--------+-------------------------+--------+ CCA Distal73      14                                                +----------+--------+--------+--------+-------------------------+--------+ ICA Prox  80      19              smooth and heterogenous           +----------+--------+--------+--------+-------------------------+--------+ ICA Distal86      29                                                +----------+--------+--------+--------+-------------------------+--------+  ECA       172     12              heterogenous and calcific         +----------+--------+--------+--------+-------------------------+--------+ +----------+--------+-------+----------------+-------------------+           PSV cm/sEDV cmsDescribe        Arm Pressure (mmHG) +----------+--------+-------+----------------+-------------------+ Subclavian190            Multiphasic, WNL                    +----------+--------+-------+----------------+-------------------+ +---------+--------+--+--------+--+---------+ VertebralPSV cm/s56EDV cm/s18Antegrade +---------+--------+--+--------+--+---------+  Left  Carotid Findings: +----------+--------+-------+--------+--------------------------------+--------+           PSV cm/sEDV    StenosisPlaque Description              Comments                   cm/s                                                    +----------+--------+-------+--------+--------------------------------+--------+ CCA Prox  139     26                                                      +----------+--------+-------+--------+--------------------------------+--------+ CCA Distal81      19             smooth and heterogenous                  +----------+--------+-------+--------+--------------------------------+--------+ ICA Prox  148     50     40-59%  heterogenous and calcific                +----------+--------+-------+--------+--------------------------------+--------+ ICA Distal95      34                                                      +----------+--------+-------+--------+--------------------------------+--------+ ECA       136     11             smooth, heterogenous and                                                  calcific                                 +----------+--------+-------+--------+--------------------------------+--------+ +----------+--------+--------+----------------+-------------------+           PSV cm/sEDV cm/sDescribe        Arm Pressure (mmHG) +----------+--------+--------+----------------+-------------------+ [id number]             Multiphasic, WNL                    +----------+--------+--------+----------------+-------------------+ +---------+--------+---+--------+--+---------+ VertebralPSV cm/s111EDV cm/s42Antegrade +---------+--------+---+--------+--+---------+   Summary: Right Carotid: Velocities in the right ICA are consistent with a  1-39% stenosis. Left Carotid: Velocities in the left ICA are consistent with a 40-59% stenosis. Vertebrals:  Bilateral vertebral arteries demonstrate antegrade  flow. Subclavians: Normal flow hemodynamics were seen in bilateral subclavian              arteries. *See table(s) above for measurements and observations.  Electronically signed by Antony Contras MD on 01/31/2021 at 1:13:07 PM.    Final    CT ANGIO HEAD CODE STROKE  Result Date: 01/30/2021 CLINICAL DATA:  Initial evaluation for acute stroke, aphasia, right-sided facial droop. EXAM: CT ANGIOGRAPHY HEAD AND NECK TECHNIQUE: Multidetector CT imaging of the head and neck was performed using the standard protocol during bolus administration of intravenous contrast. Multiplanar CT image reconstructions and MIPs were obtained to evaluate the vascular anatomy. Carotid stenosis measurements (when applicable) are obtained utilizing NASCET criteria, using the distal internal carotid diameter as the denominator. CONTRAST:  47mL OMNIPAQUE IOHEXOL 350 MG/ML SOLN COMPARISON:  Prior CT from earlier the same day. FINDINGS: CTA NECK FINDINGS Aortic arch: Visualized aortic arch normal in caliber with normal branch pattern. Moderate atheromatous change about the visualized arch and origin of the great vessels without hemodynamically significant stenosis. Right carotid system: Right CCA patent from its origin to the bifurcation without stenosis. Scattered mixed plaque about the right carotid bulb/proximal right ICA without significant stenosis. Right ICA patent distally without stenosis, dissection or occlusion. Left carotid system: Left CCA patent from its origin to the bifurcation without stenosis. Bulky calcified plaque about the left carotid bulb/proximal left ICA. Associated stenosis of up to 75% by NASCET criteria. Left ICA patent distally without stenosis, dissection or occlusion. Vertebral arteries: Both vertebral arteries arise from the subclavian arteries. No proximal subclavian artery stenosis. Right vertebral artery slightly dominant. Atheromatous plaque at the origin of the right vertebral artery without high-grade  stenosis. Vertebral arteries otherwise widely patent within the neck without stenosis, dissection or occlusion. Skeleton: No acute osseous finding. No discrete or worrisome osseous lesions. Mild cervical spondylosis without significant stenosis. Other neck: No other acute soft tissue abnormality within the neck. No mass or adenopathy. 1.3 cm sebaceous cyst noted at the left posterior upper neck. Upper chest: Visualized upper chest demonstrates no acute finding. Centrilobular emphysematous changes noted. 4 mm left upper lobe nodule noted (series 5, image 171), indeterminate. Review of the MIP images confirms the above findings CTA HEAD FINDINGS Anterior circulation: Petrous segments widely patent. Advanced atheromatous change throughout the carotid siphons bilaterally, right worse than left. Associated moderate multifocal stenosis about the cavernous left ICA. More moderate to severe narrowing present at the cavernous right ICA. A1 segments patent. Normal anterior communicating artery complex. Anterior cerebral arteries patent to their distal aspects without stenosis. No M1 stenosis or occlusion. Normal MCA bifurcations. On the left, there is abrupt occlusion of a proximal left M3 branch, superior division (series 7, image 98). No other visible proximal left MCA branch occlusion. Right MCA branches well perfused. Posterior circulation: Atheromatous irregularity within the V4 segments bilaterally without significant stenosis. Both PICA origins patent and normal. Basilar patent to its distal aspect without stenosis. Superior cerebellar arteries patent bilaterally. Right PCA supplied via the basilar. Fetal type origin of the left PCA. Both PCAs perfused to their distal aspects without stenosis. Venous sinuses: Grossly patent allowing for timing the contrast bolus. Anatomic variants: Fetal type origin of the left PCA. No visible aneurysm. Review of the MIP images confirms the above findings IMPRESSION: 1. Positive CTA  for emergent large vessel occlusion, with abrupt  occlusion of a proximal left M3 branch, superior division. 2. 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 3. 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 4. 4 mm left upper lobe nodule, indeterminate. No follow-up needed if patient is low-risk. Non-contrast chest CT can be considered in 12 months if patient is high-risk. This recommendation follows the consensus statement: Guidelines for Management of Incidental Pulmonary Nodules Detected on CT Images: From the Fleischner Society 2017; Radiology 2017; 284:228-243. 5. Aortic Atherosclerosis (ICD10-I70.0) and Emphysema (ICD10-J43.9). Critical Value/emergent results were called by telephone at the time of interpretation on 01/30/2021 at 4:00 am to provider [facility] , who verbally acknowledged these results. Electronically Signed   By: Jeannine Boga M.D.   On: 01/30/2021 04:32   CT ANGIO NECK CODE STROKE  Result Date: 01/30/2021 CLINICAL DATA:  Initial evaluation for acute stroke, aphasia, right-sided facial droop. EXAM: CT ANGIOGRAPHY HEAD AND NECK TECHNIQUE: Multidetector CT imaging of the head and neck was performed using the standard protocol during bolus administration of intravenous contrast. Multiplanar CT image reconstructions and MIPs were obtained to evaluate the vascular anatomy. Carotid stenosis measurements (when applicable) are obtained utilizing NASCET criteria, using the distal internal carotid diameter as the denominator. CONTRAST:  23mL OMNIPAQUE IOHEXOL 350 MG/ML SOLN COMPARISON:  Prior CT from earlier the same day. FINDINGS: CTA NECK FINDINGS Aortic arch: Visualized aortic arch normal in caliber with normal branch pattern. Moderate atheromatous change about the visualized arch and origin of the great vessels without hemodynamically significant stenosis. Right  carotid system: Right CCA patent from its origin to the bifurcation without stenosis. Scattered mixed plaque about the right carotid bulb/proximal right ICA without significant stenosis. Right ICA patent distally without stenosis, dissection or occlusion. Left carotid system: Left CCA patent from its origin to the bifurcation without stenosis. Bulky calcified plaque about the left carotid bulb/proximal left ICA. Associated stenosis of up to 75% by NASCET criteria. Left ICA patent distally without stenosis, dissection or occlusion. Vertebral arteries: Both vertebral arteries arise from the subclavian arteries. No proximal subclavian artery stenosis. Right vertebral artery slightly dominant. Atheromatous plaque at the origin of the right vertebral artery without high-grade stenosis. Vertebral arteries otherwise widely patent within the neck without stenosis, dissection or occlusion. Skeleton: No acute osseous finding. No discrete or worrisome osseous lesions. Mild cervical spondylosis without significant stenosis. Other neck: No other acute soft tissue abnormality within the neck. No mass or adenopathy. 1.3 cm sebaceous cyst noted at the left posterior upper neck. Upper chest: Visualized upper chest demonstrates no acute finding. Centrilobular emphysematous changes noted. 4 mm left upper lobe nodule noted (series 5, image 171), indeterminate. Review of the MIP images confirms the above findings CTA HEAD FINDINGS Anterior circulation: Petrous segments widely patent. Advanced atheromatous change throughout the carotid siphons bilaterally, right worse than left. Associated moderate multifocal stenosis about the cavernous left ICA. More moderate to severe narrowing present at the cavernous right ICA. A1 segments patent. Normal anterior communicating artery complex. Anterior cerebral arteries patent to their distal aspects without stenosis. No M1 stenosis or occlusion. Normal MCA bifurcations. On the left, there is abrupt  occlusion of a proximal left M3 branch, superior division (series 7, image 98). No other visible proximal left MCA branch occlusion. Right MCA branches well perfused. Posterior circulation: Atheromatous irregularity within the V4 segments bilaterally without significant stenosis. Both PICA origins patent and normal. Basilar patent to its distal  aspect without stenosis. Superior cerebellar arteries patent bilaterally. Right PCA supplied via the basilar. Fetal type origin of the left PCA. Both PCAs perfused to their distal aspects without stenosis. Venous sinuses: Grossly patent allowing for timing the contrast bolus. Anatomic variants: Fetal type origin of the left PCA. No visible aneurysm. Review of the MIP images confirms the above findings IMPRESSION: 1. Positive CTA for emergent large vessel occlusion, with abrupt occlusion of a proximal left M3 branch, superior division. 2. 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 3. 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 4. 4 mm left upper lobe nodule, indeterminate. No follow-up needed if patient is low-risk. Non-contrast chest CT can be considered in 12 months if patient is high-risk. This recommendation follows the consensus statement: Guidelines for Management of Incidental Pulmonary Nodules Detected on CT Images: From the Fleischner Society 2017; Radiology 2017; 284:228-243. 5. Aortic Atherosclerosis (ICD10-I70.0) and Emphysema (ICD10-J43.9). Critical Value/emergent results were called by telephone at the time of interpretation on 01/30/2021 at 4:00 am to provider [facility] , who verbally acknowledged these results. Electronically Signed   By: Jeannine Boga M.D.   On: 01/30/2021 04:32      HISTORY OF PRESENT ILLNESS  (From H&P by  Nelva Bush, MD on 01/28/2021) Patient is a 75 year old man transferred from [facility] due to  sustained ventricular tachycardia. He has a history of coronary artery disease status post multiple MIs and known CTO's of the LCx and RCA.  He also underwent PCI to the ramus intermedius in 2014.  He was in his usual state of health until he had sudden onset of chest pain, palpitations, shortness of breath, and lightheadedness yesterday after having worked outside.  After about an hour, he called EMS and was found to be in a wide-complex tachycardia that did not respond to IV adenosine or amiodarone.  He ultimately required cardioversion due to sustained ventricular tachycardia.  He has maintained sinus rhythm with only a single brief episode of NSVT overnight.  He underwent cardiac catheterization in the setting of ventricular tachycardia and elevated troponin today, demonstrating stable appearance of the coronary arteries with widely patent LAD and ramus intermedius (including prior stent).  LCx and RCA remain chronically occluded with collaterals.  LVEDP was moderately elevated, prompting gentle diuresis prior to transfer.  Echocardiogram at [facility] showed mildly reduced LVEF with subtle global hypokinesis as well as basal inferior akinesis and possible aneurysm formation.  Mr. Watland is asymptomatic at this time.  Physical exam is notable for regular rate and rhythm without murmurs.  Lungs are clear.  There is no lower extremity edema. Plan is for patient to maintained on IV amiodarone in 2H-CVICU overnight with EP consultation to determine best antiarrhythmic strategy going forward as well as consideration for ICD placement for secondary prevention. Continue current medications for secondary prevention of CAD, as well as carvedilol that was added today for blood pressure control and additional suppression of VT.  (From Consult Note by Donnetta Simpers, MD on 01/30/2021) TEDDY HEINSOHN is a 75 y.o. male with PMH significant for HTN, HLD, prior MI, CAD, AAA, CHD, ICM who is currently admitted to CVICU  for sustained Vtach with cardioversion to NSR and EP evaluation. At 0200 on 01/30/21, he was seen at his baseline, talking. At 0315, he was noted to be aphasic with R facial droop. CTH w/o contrast with no ICH, ASPECTs of 8 with  concern for ischemic stroke in the L MCA territory. CT angio with L MCA M2 superior division occlusion. No MI in the last couple of months per wife, no hx of cranial or spinal surgery, no hx of aortic dissection, no recent GI or GU bleed, not on AC, platelet > 100k, INR < 1.7, APTT is normal. MRS: 0 NIHSS: 10 TPA: yes, given after discussion of 6% risk of ICH and 30% chance of improvement with his wife Whit Tones who consented on patient's behalf. Initial delay in tPA was due to elevated SBP to 190/110. Labetalol was given and repeat SBP was less than 170/89. tPA was started. Thrombectomy: yes, Dr. Earleen Newport discussed risks and benefits of thrombectomy with patient's wife over the phone and I witnessed the conversation.   HOSPITAL COURSE Mr.Jamaurion M McCormackis a 75 y.o.malewith history of HTN, HLD, prior MI, CAD, AAA, CHD, ICM who is currently admitted to CVICU for sustained Vtach with cardioversion to NSR and EP evaluationwho had a CODE STROKE called after sudden status change with aphasia and R sided facial droop noted by RN. CT Head confirmed that the patient had a small L MCA infarct involving the L insula and frontal operculum. Patient received tPA. CTA Head noted an acute occlusion of the L MCA at M3 and patient had unsuccesful thrombectomy. Patient continues to remain aphasic. Based on the timeline of patient stroke it appears that patient LHC my have instigated a embolus that led to the event. Have spoken with EP and recommend a 30 day heart monitor as well. Imaging confirms that patient has a moderate size L MCA infarct correlating with patient's presentation.  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 Left ICA high grade stenosis  Code Stroke:CT head subtle hypodensity involving the left insula and overlying left frontal operculum, suspicious for early acute left MCA distribution infarct.   CTA head & neckabrupt occlusion of a proximal left M3 branch, superior division. 75% atheromatous stenosis at the origin of the left ICA in the neck  IR confirmed M3 occlusion but unsuccessful one attempt of thrombectomy  CT head repeat consistent with acute/subacute infarct is noted in the left insula extending into the anterior left parietal lobe with cortical infarct noted in the postcentral gyrus.  MRI Moderate-sized acute left MCA infarct.  2D EchoLVEF 45-50% w/ LV golbal hypokinesis, mildly dilate LA  US Carotid:  R ICA 1-39% and L ICA 40-59%  Recommend 30 day monitor as outpt to rule out afib  LDL64  HgbA1c6.0  VTE prophylaxis -SCDs  No antithromboticprior to admission, now on ASA + Plavix 3 months then ASA alone give left M2/M3 occlusion.  Therapy recommendations:HH SLP  Disposition:Pending  Left carotid stenosis  CTA head & neckabrupt occlusion of a proximal left M3 branch, superior division. 75% atheromatous stenosis at the origin of the left ICA in the neck  US Carotid:  R ICA 1-39% and L ICA 40-59%  Vascular surgery Dr. Trula Slade consulted  Plan for TCAR next week with Dr. Trula Slade  Recent VTach  S/p cardioversion  Cardiology on board, appreciate help  Continue amiodarone po on discharge - 400 mg p.o. twice daily for the first 2 weeks followed by 400 mg daily for 2 weeks followed by 200 mg a day.    Hypertension  Home Carvedilol 12.5mg  BID, Lisinopril 40mg , HCTZ 25  Now on home meds with coreg and lisinopril with HCTZ  Stable   Long-term BP  goal normotensive  Hyperlipidemia  Continue home Crestor 5mg   LDL 64, goal < 70  Now on crestor 5  Continue statin at discharge  ? Oral thrash  Magic mouth wash  prescribed  Other Stroke Risk Factors  Advanced Age >/= 54   Obesity,Body mass index is 31.89 kg/m., BMI >/= 30 associated with increased stroke risk, recommend weight loss, diet and exercise as appropriate   Coronary artery disease / MI  Congestive heart failure  AAA  DISCHARGE EXAM Vitals:   02/02/21 0317 02/02/21 0438 02/02/21 0739 02/02/21 1142  BP: (!) 147/67  (!) 184/77 (!) 152/64  Pulse: 67  67 63  Resp: 18  20 20   Temp: 97.6 F (36.4 C)  98.8 F (37.1 C) 98.4 F (36.9 C)  TempSrc: Oral  Oral Oral  SpO2: 97%  98% 98%  Weight:  101.1 kg     General - Well nourished, well developed, in no apparent distress.  Ophthalmologic - fundi not visualized due to noncooperation.  Cardiovascular - Regular rhythm and rate.  Mental Status -  Level of arousal and not able to answer orientation questions due to expressive aphasia.  However, orientated to place and people and time with " yes" and " no" answers over choices. Expressive aphasia, very limited speech output, able to name 1/4, able to repeat 3 word sentences.  However not able to repeat 5 word sentences.  Follows simple commands  Cranial Nerves II - XII - II - Visual field intact OU. III, IV, VI - Extraocular movements intact. V - Facial sensation intact bilaterally. VII - Right nasolabial fold flattening. VIII - Hearing & vestibular intact bilaterally. X - Palate elevates symmetrically. XI - Chin turning & shoulder shrug intact bilaterally. XII - Tongue protrusion intact.  Motor Strength - The patient's strength was normal in all extremities and pronator drift was absent except slight weakness of right finger grip.  Bulk was normal and fasciculations were absent.   Motor Tone - Muscle tone was assessed at the neck and appendages and was normal.  Reflexes - The patient's reflexes were symmetrical in all extremities and he had no pathological reflexes.  Sensory - Light touch, temperature/pinprick were  assessed and were symmetrical.    Coordination - The patient had normal movements in the hands with no ataxia or dysmetria.  Tremor was absent.  Gait and Station - deferred.  DISCHARGE INSTRUCTIONS GIVEN TO THE PATIENT  Amiodarone 400 mg p.o. twice daily for the first 2 weeks followed by 400 mg daily for 2 weeks followed by 200 mg a day.   ASA 81 mg daily and Plavix 75 mg daily for 3 months then ASA 81 mg daily alone.  Recommend 30 day monitor as outpatient for possible abnormal heart rhythym - cardiology to arrange  Left carotid artery surgery Friday 02/07/2021 with Dr Trula Slade as instructed.  Chest CT scan to evaluate small lung nodule to be performed in one year. This can be arranged through your primary MD - Dr. Baldemar Lenis.    DISCHARGE DIET   Diet Order            DIET DYS 2 Room service appropriate? Yes with Assist; Fluid consistency: Thin  Diet effective now                liquids  DISCHARGE PLAN  Disposition: Discharge to home  ASA + Plavix 3 months then ASA alone for secondary stroke prevention.  Ongoing risk factor control by Primary Care Physician at time  of discharge  Follow-up Derinda Late, MD in 2 weeks.  Follow-up in [facility] in 4 weeks, office to schedule an appointment.   Follow-up Cardiology Lars Mage 02/19/21 at 3:40  45 minutes were spent preparing discharge.  Rosalin Hawking, MD PhD Stroke Neurology 02/02/2021 6:16 PM

## 2021-02-02 NOTE — Plan of Care (Signed)
  Problem: Health Behavior/Discharge Planning: Goal: Ability to manage health-related needs will improve Outcome: Adequate for Discharge   

## 2021-02-02 NOTE — Discharge Instructions (Signed)
DISCHARGE INSTRUCTIONS GIVEN TO THE PATIENT  Amiodarone 400 mg p.o. twice daily for the first 2 weeks followed by 400 mg daily for 2 weeks followed by 200 mg a day.   ASA 81 mg daily and Plavix 75 mg daily for 3 months then ASA 81 mg daily alone.  Recommend 30 day monitor as outpatient for possible abnormal heart rhythym - cardiology to arrange  Left carotid artery surgery Friday 02/07/2021 with Dr Trula Slade as instructed.  Chest CT scan to evaluate small lung nodule to be performed in one year. This can be arranged through your primary MD - Dr. Baldemar Lenis.  Nystatin solution - swish and spit twice daily for oral thrush. Do not swallow.  Ventricular Tachycardia  Ventricular tachycardia is a type of arrhythmia that begins in the lower chambers of the heart (ventricles). An arrhythmia is a type of abnormal heart rhythm. A normal heartbeat usually starts when an area in the heart called the sinoatrial (SA) node releases an electrical signal. With ventricular tachycardia, electrical signals in the ventricles fire abnormally and interfere with the electrical signals released by the SA node. A normal resting heart rate is 60-100 beats per minute. During an episode of ventricular tachycardia, the heart reaches 100 beats per minute or higher. This condition can be life-threatening and should be treated right away. What are the causes? This condition is caused by abnormal electrical activity in your ventricles. This may result from: Your heart not getting enough oxygen. Blood flow problems in your arteries may cause this. Cardiomyopathy, or diseases of your heart muscle. Medicines. Sarcoidosis, or a disease that causes inflammation and affects multiple areas of your body. Drug use, such as use of cocaine, methamphetamines, or anabolic steroids. What increases the risk? The following factors may make you more likely to develop this condition: A previous heart attack. Diseases or disorders of your heart,  such as: Structural abnormalities of the heart. These may be present at birth (congenital) or may develop over time. Narrowed or blocked arteries that lead to the heart (coronary artery disease). Abnormal heart tissue. Leaking or narrow valves in your heart. A family history of stopped heartbeat (cardiac arrest). A family history of coronary artery disease. Certain health conditions that can cause heart problems, such as: Diabetes. An infection that affects your heart. High blood pressure (hypertension). An overactive or underactive thyroid. Sleep apnea. This is paused breathing or shallow breathing during sleep. High cholesterol. Using products that contain tobacco. Heavy alcohol use. What are the signs or symptoms? Symptoms of this condition include: Fast or irregular heartbeats (palpitations). Shortness of breath. Anxiety. Dizziness or light-headedness. Fainting. Chest pain. Cardiac arrest caused by an arrhythmia. How is this diagnosed? This condition may be diagnosed based on: Electrocardiogram (ECG), which checks for problems with electrical activity in your heart. A physical exam. Your symptoms and medical history. Holter monitor or event monitor test, which involves wearing a portable device that monitors your heart rate over time. You may also have other tests, including: Blood tests. Chest X-ray. Echocardiogram. This test uses sound waves to create images of your heart. Angiogram. During this test, dye is injected into your bloodstream, and then X-rays are taken. The dye helps to show blood flow in your arteries. Exercise stress test. During this test, you will have an ECG while you exercise on a treadmill. Cardiac CT scan or cardiac MRI. How is this treated? This condition is a medical emergency that must be treated right away. If the condition is not treated  right away, it becomes life-threatening. Treatment for this condition depends on the cause. Treatment may  include: An electric shock, or cardioversion, to return your heartbeat to a normal rhythm. Medicines that slow your heart rate and return it to a normal rhythm (anti-arrhythmics). An electrophysiology study. This can help locate areas of heart tissue that are causing fast heartbeats. In this procedure, a long, thin tube (catheter) is inserted into a vein and moved to your heart to evaluate your heart's electrical activity. In some cases, the heart tissue that is causing problems may be burned out with a low-level energy source delivered through the catheter. This may help your heart keep a normal rhythm. An implantable cardioverter-defibrillator (ICD). This device is inserted under the skin in your chest to monitor your heartbeat. When the ICD senses an irregular heartbeat, it sends a shock to return the heartbeat to normal. A wearable cardioverter-defibrillator (WCD) to use while treatment options are being evaluated. Genetic counseling to check whether your family members are at risk for ventricular tachycardia. Surgery to improve blood flow to the heart. Follow these instructions at home: Eating and drinking Eat a healthy diet. This includes plenty of fruits and vegetables, whole grains, lean meats, and low-fat or fat-free dairy products. Avoid eating foods that are high in saturated fat, trans fat, sugar, or salt.   Lifestyle Do not drink alcohol if: Your health care provider tells you not to drink. You are pregnant, may be pregnant, or are planning to become pregnant. If you drink alcohol: Limit how much you use to: 0-1 drink a day for women. 0-2 drinks a day for men. Be aware of how much alcohol is in your drink. In the [place] one drink equals one 12 oz bottle of beer (355 mL), one 5 oz glass of wine (148 mL), or one 1 oz glass of hard liquor (44 mL). Do not use any products that contain nicotine or tobacco, such as cigarettes, e-cigarettes, and chewing tobacco. If you need help  quitting, ask your health care provider. Do not use drugs that excite, or stimulate, your system, such as cocaine or methamphetamines. Maintain a healthy weight. Manage stress through relaxation exercises, yoga, quiet time, or meditation. Try to get at least 7 hours of sleep each night.   General instructions Take over-the-counter and prescription medicines only as told by your health care provider. Exercise regularly. Ask what activities are safe for you. Aim for one or a combination of the following each week: 150 minutes of moderate-intensity exercise. 75 minutes of exercise that takes a lot of effort. Keep all follow-up visits as told by your health care provider. This is important. Where to find more information American [employer] Association: www.heart.org Contact a health care provider if: Your symptoms get worse. You develop new symptoms, such as new palpitations or shortness of breath. You feel depressed. Get help right away if you have: An episode of ventricular tachycardia that lasts more than a few seconds. Chest pain. Trouble breathing. Light-headedness or fainting. These symptoms may represent a serious problem that is an emergency. Do not wait to see if the symptoms will go away. Get medical help right away. Call your local emergency services (911 in the [place] Do not drive yourself to the hospital. Summary Ventricular tachycardia is a fast heartbeat that begins in the lower chambers of the heart (ventricles). This condition can be life-threatening and should be treated right away. Treatment may include electric shock, medicines, low-level energy therapy, a cardioverter-defibrillator, or surgery. Get  help right away if you have ventricular tachycardia symptoms lasting longer than a few seconds, chest pain, or trouble breathing. This information is not intended to replace advice given to you by your health care provider. Make sure you discuss any questions you have with your  health care provider. Document Revised: 07/26/2019 Document Reviewed: 07/26/2019 Elsevier Patient Education  2021 [place].     Femoral Site Care This sheet gives you information about how to care for yourself after your procedure. Your health care provider may also give you more specific instructions. If you have problems or questions, contact your health care provider. What can I expect after the procedure? After the procedure, it is common to have:  Bruising that usually fades within 1-2 weeks.  Tenderness at the site. Follow these instructions at home: Wound care 1. Follow instructions from your health care provider about how to take care of your insertion site. Make sure you: ? Wash your hands with soap and water before you change your bandage (dressing). If soap and water are not available, use hand sanitizer. ? Change your dressing as directed- pressure dressing removed 24 hours post-procedure (and switch for bandaid), bandaid removed 72 hours post-procedure 2. Do not take baths, swim, or use a hot tub for 7 days post-procedure. 3. You may shower 48 hours after the procedure or as told by your health care provider. ? Gently wash the site with plain soap and water. ? Pat the area dry with a clean towel. ? Do not rub the site. This may cause bleeding. 4. Check your site every day for signs of infection. Check for: ? Redness, swelling, or pain. ? Fluid or blood. ? Warmth. ? Pus or a bad smell. Activity  Do not stoop, bend, or lift anything that is heavier than 10 lb (4.5 kg) for 2 weeks post-procedure.  Do not drive self for 2 weeks post-procedure. Contact a health care provider if you have:  A fever or chills.  You have redness, swelling, or pain around your insertion site. Get help right away if:  The catheter insertion area swells very fast.  You pass out.  You suddenly start to sweat or your skin gets clammy.  The catheter insertion area is bleeding, and  the bleeding does not stop when you hold steady pressure on the area.  The area near or just beyond the catheter insertion site becomes pale, cool, tingly, or numb. These symptoms may represent a serious problem that is an emergency. Do not wait to see if the symptoms will go away. Get medical help right away. Call your local emergency services (911 in the [place] Do not drive yourself to the hospital.  This information is not intended to replace advice given to you by your health care provider. Make sure you discuss any questions you have with your health care provider. Document Revised: 10/04/2017 Document Reviewed: 10/04/2017 Elsevier Patient Education  2020 [employer].

## 2021-02-02 NOTE — Plan of Care (Signed)

## 2021-02-02 NOTE — Progress Notes (Signed)
Progress Note  Patient Name: Garrett Hunter Date of Encounter: 02/02/2021  [facility] Cardiologist: [name]  Subjective   Feeling well today.  Ready to return home.  Speech is improving.  Inpatient Medications    Scheduled Meds: .  stroke: mapping our early stages of recovery book   Does not apply Once  . amiodarone  400 mg Oral BID  . aspirin EC  81 mg Oral Daily  . carvedilol  12.5 mg Oral BID WC  . Chlorhexidine Gluconate Cloth  6 each Topical Daily  . clopidogrel  75 mg Oral Daily  . hydrochlorothiazide  12.5 mg Oral Daily  . lisinopril  20 mg Oral Daily  . pantoprazole sodium  40 mg Oral QHS  . rosuvastatin  5 mg Oral Daily  . sodium chloride flush  3 mL Intravenous Q12H   Continuous Infusions: . sodium chloride    . sodium chloride    . clevidipine Stopped (01/31/21 0921)   PRN Meds: sodium chloride, acetaminophen, docusate sodium, hydrALAZINE, labetalol, ondansetron (ZOFRAN) IV, sodium chloride flush   Vital Signs    Vitals:   02/02/21 0200 02/02/21 0317 02/02/21 0438 02/02/21 0739  BP: (!) 142/65 (!) 147/67  (!) 184/77  Pulse: 65 67  67  Resp:  18  20  Temp: 98.2 F (36.8 C) 97.6 F (36.4 C)  98.8 F (37.1 C)  TempSrc: Oral Oral  Oral  SpO2:  97%  98%  Weight:   101.1 kg     Intake/Output Summary (Last 24 hours) at 02/02/2021 0746 Last data filed at 02/02/2021 0650 Gross per 24 hour  Intake 450 ml  Output 800 ml  Net -350 ml   Last 3 Weights 02/02/2021 02/01/2021 01/29/2021  Weight (lbs) 222 lb 14.2 oz 222 lb 14.2 oz 228 lb 9.9 oz  Weight (kg) 101.1 kg 101.1 kg 103.7 kg  Some encounter information is confidential and restricted. Go to Review Flowsheets activity to see all data.      Telemetry    Sinus rhythm, rare PVCs, personally reviewed  ECG    None new  Physical Exam   GEN: Well nourished, well developed, in no acute distress  HEENT: normal  Neck: no JVD, carotid bruits, or masses Cardiac: RRR; no murmurs, rubs, or gallops,no  edema  Respiratory:  clear to auscultation bilaterally, normal work of breathing GI: soft, nontender, nondistended, + BS MS: no deformity or atrophy  Skin: warm and dry Neuro: Left facial droop, expressive aphasia Psych: euthymic mood, full affect   Labs    High Sensitivity Troponin:   Recent Labs  Lab 01/28/21 0031 01/28/21 0255 01/28/21 0431 01/28/21 0640 01/28/21 0921  TROPONINIHS 652* 1,272* 1,509* 1,844* 2,162*      Chemistry Recent Labs  Lab 01/27/21 1754 01/28/21 0431 01/31/21 0431 02/01/21 0318 02/02/21 0213  NA 138   < > 138 141 138  K 4.3   < > 4.0 3.9 3.7  CL 101   < > 102 102 102  CO2 25   < > 29 30 26   GLUCOSE 173*   < > 127* 109* 99  BUN 23   < > 14 19 18   CREATININE 1.39*   < > 0.95 1.15 1.03  CALCIUM 9.9   < > 8.8* 9.1 9.3  PROT 7.1  --   --   --   --   ALBUMIN 4.5  --   --   --   --   AST 32  --   --   --   --  ALT 32  --   --   --   --   ALKPHOS 76  --   --   --   --   BILITOT 0.7  --   --   --   --   GFRNONAA 54*   < > >60 >60 >60  ANIONGAP 12   < > 7 9 10    < > = values in this interval not displayed.     Hematology Recent Labs  Lab 01/30/21 0827 02/01/21 0318 02/02/21 0213  WBC 14.8* 11.1* 9.7  RBC 4.34 3.72* 3.65*  HGB 14.4 12.4* 12.1*  HCT 43.5 37.7* 36.0*  MCV 100.2* 101.3* 98.6  MCH 33.2 33.3 33.2  MCHC 33.1 32.9 33.6  RDW 13.2 13.2 13.0  PLT 192 143* 157    BNPNo results for input(s): BNP, PROBNP in the last 168 hours.   DDimer No results for input(s): DDIMER in the last 168 hours.   Radiology    CT HEAD CODE STROKE WO CONTRAST Result Date: 01/30/2021 CLINICAL DATA:  Code stroke. Initial evaluation for acute aphasia, right-sided facial droop EXAM: CT HEAD WITHOUT CONTRAST TECHNIQUE: Contiguous axial images were obtained from the base of the skull through the vertex without intravenous contrast. COMPARISON:  None available. FINDINGS: Brain: Age-related cerebral atrophy with mild chronic small vessel ischemic disease.  No acute intracranial hemorrhage. There is question of subtle hypodensity involving the left insula and overlying left frontal operculum, suspicious for possible early acute left MCA distribution infarct. No other visible acute large vessel territory infarct. No mass lesion, midline shift, or mass effect. No hydrocephalus or extra-axial fluid collection. Vascular: No visible hyperdense vessel. Calcified atherosclerosis present at skull base. Skull: Scalp soft tissues within normal limits.  Calvarium intact. Sinuses/Orbits: Globes and orbital soft tissues within normal limits. Patient status post bilateral ocular lens replacement. Paranasal sinuses are clear. No mastoid effusion. Other: None. ASPECTS [facility] Stroke Program Early CT Score) - Ganglionic level infarction (caudate, lentiform nuclei, internal capsule, insula, M1-M3 cortex): 6 - Supraganglionic infarction (M4-M6 cortex): 2 Total score (0-10 with 10 being normal): 8 IMPRESSION: 1. 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 No intracranial hemorrhage. 2. ASPECTS is 8. 3. Age-related cerebral atrophy with mild chronic small vessel ischemic disease. These results were communicated to Dr. Lorrin Goodell At 3:57 amon 4/28/2022by text page via the [facility] messaging system. Electronically Signed   By: Jeannine Boga M.D.   On: 01/30/2021 04:05    CT ANGIO HEAD CODE STROKE Result Date: 01/30/2021 CLINICAL DATA:  Initial evaluation for acute stroke, aphasia, right-sided facial droop. EXAM: CT ANGIOGRAPHY HEAD AND NECK TECHNIQUE: Multidetector CT imaging of the head and neck was performed using the standard protocol during bolus administration of intravenous contrast. Multiplanar CT image reconstructions and MIPs were obtained to evaluate the vascular anatomy. Carotid stenosis measurements (when applicable) are obtained utilizing NASCET criteria, using the distal  internal carotid diameter as the denominator. CONTRAST:  58mL OMNIPAQUE IOHEXOL 350 MG/ML SOLN COMPARISON:  Prior CT from earlier the same day. FINDINGS: CTA NECK FINDINGS Aortic arch: Visualized aortic arch normal in caliber with normal branch pattern. Moderate atheromatous change about the visualized arch and origin of the great vessels without hemodynamically significant stenosis. Right carotid system: Right CCA patent from its origin to the bifurcation without stenosis. Scattered mixed plaque about the right carotid bulb/proximal right ICA without significant stenosis. Right ICA patent distally without stenosis, dissection or occlusion.  Left carotid system: Left CCA patent from its origin to the bifurcation without stenosis. Bulky calcified plaque about the left carotid bulb/proximal left ICA. Associated stenosis of up to 75% by NASCET criteria. Left ICA patent distally without stenosis, dissection or occlusion. Vertebral arteries: Both vertebral arteries arise from the subclavian arteries. No proximal subclavian artery stenosis. Right vertebral artery slightly dominant. Atheromatous plaque at the origin of the right vertebral artery without high-grade stenosis. Vertebral arteries otherwise widely patent within the neck without stenosis, dissection or occlusion. Skeleton: No acute osseous finding. No discrete or worrisome osseous lesions. Mild cervical spondylosis without significant stenosis. Other neck: No other acute soft tissue abnormality within the neck. No mass or adenopathy. 1.3 cm sebaceous cyst noted at the left posterior upper neck. Upper chest: Visualized upper chest demonstrates no acute finding. Centrilobular emphysematous changes noted. 4 mm left upper lobe nodule noted (series 5, image 171), indeterminate. Review of the MIP images confirms the above findings CTA HEAD FINDINGS Anterior circulation: Petrous segments widely patent. Advanced atheromatous change throughout the carotid siphons  bilaterally, right worse than left. Associated moderate multifocal stenosis about the cavernous left ICA. More moderate to severe narrowing present at the cavernous right ICA. A1 segments patent. Normal anterior communicating artery complex. Anterior cerebral arteries patent to their distal aspects without stenosis. No M1 stenosis or occlusion. Normal MCA bifurcations. On the left, there is abrupt occlusion of a proximal left M3 branch, superior division (series 7, image 98). No other visible proximal left MCA branch occlusion. Right MCA branches well perfused. Posterior circulation: Atheromatous irregularity within the V4 segments bilaterally without significant stenosis. Both PICA origins patent and normal. Basilar patent to its distal aspect without stenosis. Superior cerebellar arteries patent bilaterally. Right PCA supplied via the basilar. Fetal type origin of the left PCA. Both PCAs perfused to their distal aspects without stenosis. Venous sinuses: Grossly patent allowing for timing the contrast bolus. Anatomic variants: Fetal type origin of the left PCA. No visible aneurysm. Review of the MIP images confirms the above findings IMPRESSION: 1. Positive CTA for emergent large vessel occlusion, with abrupt occlusion of a proximal left M3 branch, superior division. 2. 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 3. 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 4. 4 mm left upper lobe nodule, indeterminate. No follow-up needed if patient is low-risk. Non-contrast chest CT can be considered in 12 months if patient is high-risk. This recommendation follows the consensus statement: Guidelines for Management of Incidental Pulmonary Nodules Detected on CT Images: From the Fleischner Society 2017; Radiology 2017; 284:228-243. 5. Aortic Atherosclerosis (ICD10-I70.0) and Emphysema (ICD10-J43.9). Critical  Value/emergent results were called by telephone at the time of interpretation on 01/30/2021 at 4:00 am to provider [facility] , who verbally acknowledged these results. Electronically Signed   By: Jeannine Boga M.D.   On: 01/30/2021 04:32     Cardiac Studies    01/31/21: carotid US (prelim) Summary:  Right Carotid: Velocities in the right ICA are consistent with a 1-39%  stenosis.   Left Carotid: Velocities in the left ICA are consistent with a 40-59%  stenosis.   Vertebrals:  Bilateral vertebral arteries demonstrate antegrade flow.  Subclavians: Normal flow hemodynamics were seen in bilateral subclavian               arteries.    01/28/21: LHC Conclusions: 1. Severe two-vessel coronary artery disease with chronic total occlusions  of the proximal LCx and RCA.  There are minimal luminal irregularities in the LAD without significant stenosis. 2. Widely patent ramus intermedius stent. 3. Moderately elevated left ventricular filling pressure.   Recommendations: 1. Continue IV amiodarone and ICU monitoring in the setting of sustained ventricular tachycardia that is likely scar mediated. 2. Gentle diuresis. 3. Aggressive secondary prevention of coronary artery disease. 4. EP consultation for further management of VT.       01/28/21: TTE IMPRESSIONS   1. Left ventricular ejection fraction, by estimation, is 45 to 50%. The  left ventricle has mildly decreased function. The left ventricle  demonstrates global hypokinesis. There is mild left ventricular  hypertrophy. Left ventricular diastolic parameters  are consistent with Grade II diastolic dysfunction (pseudonormalization).  There is akinesis of the left ventricular, basal inferior segment.   2. Right ventricular systolic function is normal. The right ventricular  size is normal.   3. Left atrial size was mildly dilated.   4. The mitral valve is grossly normal. Trivial mitral valve  regurgitation. No evidence of  mitral stenosis.   5. The aortic valve was not well visualized. Aortic valve regurgitation  is not visualized. No aortic stenosis is present.   6. Aortic dilatation noted. There is mild dilatation of the aortic root,  measuring 39 mm.   Patient Profile     75 y.o. male with a hx of CAD (dating back to 1996, last PCI was 2014), AAA s/p endovascular repair in 2018, HTN, HLD, ICM  He was in his USOH day of his admission to [facility], he had worked in the yard and when getting ready to shower and sudden onset of palpitations and some degree of chest discomfort unlike his anginal symptoms historically. He showered and ultimately became more symptomatic, weak, and called 911, found in WCT, given adenosine and amio with rhythm change. He was seen in the ER by [name] and cardioverted to SR, started on heparin gtt and amiodarone gtt   He had echo with LVEF 45-50%, grade II DD, and cath 01/28/21 with CTA of RCA and LCx, patent ramus stent and minimal luminal irregularities of the LAD, mod elevated LVEDP planned for gentle diuresis and transfer to [facility] for further management and EP evaluation.  Arrived to [facility] 01/28/21  Assessment & Plan    1.ventricular tachycardia: Symptomatic with progressive weakness.  Underwent cardioversion in the emergency room.  Has been switched to p.o. amiodarone.  Okay for discharge today on 400 mg p.o. twice daily for the first 2 weeks followed by 400 mg daily for 2 weeks followed by 200 mg a day.     2.  Coronary artery disease: Severe but stable.  High-sensitivity troponin thought demand.   3.  Hypertension: Per neurology  4.  Ischemic cardiomyopathy: Not obviously volume overloaded.  5.  Acute stroke: Found to have carotid stenosis.  Has plans for carotid artery surgery this coming Friday.  At this point, his surgery seems somewhat urgent.  He would be intermediate to high risk for this intermediate to high risk procedure, though he is fairly optimized with his  medications, and the most recent catheterization without evidence of new or worsening coronary artery disease.  No further cardiology work-up is needed prior to surgery.  [facility] Gian Ybarra sign off.   Medication Recommendations: Amiodarone as above Other recommendations (labs, testing, etc): None Follow up as an outpatient: To be arranged in cardiology clinic  For questions or updates, please contact [place] Please consult [email or web address]  for contact info under        Signed, Jayci Ellefson Meredith Leeds, MD  02/02/2021, 7:46 AM

## 2021-02-02 NOTE — Progress Notes (Incomplete)
STROKE TEAM PROGRESS NOTE   INTERVAL HISTORY His wife and son are at the bedside.  Patient reclining in bed, moving all extremities, still has expressive aphasia.  MRI showed left MCA stroke.  CTA head and neck showed left ICA 75% stenosis, carotid Doppler left ICA 40 to 59% stenosis.  Vascular surgery consulted.  PT/OT no recs.   Vitals:   02/02/21 0122 02/02/21 0200 02/02/21 0317 02/02/21 0438  BP: (!) 151/71 (!) 142/65 (!) 147/67   Pulse: (!) 58 65 67   Resp:   18   Temp: 99.8 F (37.7 C) 98.2 F (36.8 C) 97.6 F (36.4 C)   TempSrc: Oral Oral Oral   SpO2:   97%   Weight:    101.1 kg   CBC:  Recent Labs  Lab 02/01/21 0318 02/02/21 0213  WBC 11.1* 9.7  HGB 12.4* 12.1*  HCT 37.7* 36.0*  MCV 101.3* 98.6  PLT 143* 790   Basic Metabolic Panel:  Recent Labs  Lab 01/28/21 0431 01/28/21 1643 01/30/21 0046 01/30/21 0827 02/01/21 0318 02/02/21 0213  NA 138   < > 138   < > 141 138  K 3.7   < > 4.2   < > 3.9 3.7  CL 100   < > 103   < > 102 102  CO2 26   < > 25   < > 30 26  GLUCOSE 120*   < > 87   < > 109* 99  BUN 26*   < > 16   < > 19 18  CREATININE 1.39*   < > 1.13   < > 1.15 1.03  CALCIUM 9.3   < > 9.2   < > 9.1 9.3  MG 2.2  --  1.9  --   --   --   PHOS 4.4  --   --   --   --   --    < > = values in this interval not displayed.   Lipid Panel:  Recent Labs  Lab 01/30/21 0530  CHOL 119  TRIG 85  HDL 38*  CHOLHDL 3.1  VLDL 17  LDLCALC 64   HgbA1c:  Recent Labs  Lab 01/30/21 0046  HGBA1C 6.0*   Urine Drug Screen: No results for input(s): LABOPIA, COCAINSCRNUR, LABBENZ, AMPHETMU, THCU, LABBARB in the last 168 hours.  Alcohol Level No results for input(s): ETH in the last 168 hours.  IMAGING past 24 hours No results found.  PHYSICAL EXAM  Temp:  [97.6 F (36.4 C)-99.8 F (37.7 C)] 97.6 F (36.4 C) (05/01 0317) Pulse Rate:  [58-71] 67 (05/01 0317) Resp:  [17-20] 18 (05/01 0317) BP: (138-177)/(65-84) 147/67 (05/01 0317) SpO2:  [95 %-99 %] 97 % (05/01  0317) Weight:  [101.1 kg] 101.1 kg (05/01 0438)  General - Well nourished, well developed, in no apparent distress.  Ophthalmologic - fundi not visualized due to noncooperation.  Cardiovascular - Regular rhythm and rate.  Mental Status -  Level of arousal and not able to answer orientation questions due to expressive aphasia.  However, orientated to place and people and time with " yes" and " no" answers over choices. Expressive aphasia, very limited speech output, able to name 1/4, able to repeat 3 word sentences.  However not able to repeat 5 word sentences.  Follows simple commands  Cranial Nerves II - XII - II - Visual field intact OU. III, IV, VI - Extraocular movements intact. V - Facial sensation intact bilaterally. VII - Right  nasolabial fold flattening. VIII - Hearing & vestibular intact bilaterally. X - Palate elevates symmetrically. XI - Chin turning & shoulder shrug intact bilaterally. XII - Tongue protrusion intact.  Motor Strength - The patient's strength was normal in all extremities and pronator drift was absent except slight weakness of right finger grip.  Bulk was normal and fasciculations were absent.   Motor Tone - Muscle tone was assessed at the neck and appendages and was normal.  Reflexes - The patient's reflexes were symmetrical in all extremities and he had no pathological reflexes.  Sensory - Light touch, temperature/pinprick were assessed and were symmetrical.    Coordination - The patient had normal movements in the hands with no ataxia or dysmetria.  Tremor was absent.  Gait and Station - deferred.   ASSESSMENT/PLAN Mr. TANMAY HALTEMAN is a 75 y.o. male with history of HTN, HLD, prior MI, CAD, AAA, CHD, ICM who is currently admitted to CVICU for sustained Vtach with cardioversion to NSR and EP evaluation who had a CODE STROKE called after sudden status change with aphasia and R sided facial droop noted by RN. CT Head confirmed that the patient had a  small L MCA infarct involving the L insula and frontal operculum. Patient received tPA. CTA Head noted an acute occlusion of the L MCA at M3 and patient had unsuccesful thrombectomy. Patient continues to remain aphasic. Based on the timeline of patient stroke it appears that patient LHC my have instigated a embolus that led to the event. Have spoken with EP and recommend a 30 day heart monitor as well. Imaging confirms that patient has a moderate size L MCA infarct correlating with patient's presentation.  Stroke: Moderate L MCA infarct involving the L insula and frontal operculum, slightly cardioembolic with recent LHC vs. Left ICA high grade stenosis  Code Stroke : CT head subtle hypodensity involving the left insula and overlying left frontal operculum, suspicious for early acute left MCA distribution infarct.   CTA head & neck abrupt occlusion of a proximal left M3 branch, superior division. 75% atheromatous stenosis at the origin of the left ICA in the neck  IR confirmed M3 occlusion but unsuccessful one attempt of thrombectomy  CT head repeat consistent with acute/subacute infarct is noted in the left insula extending into the anterior left parietal lobe with cortical infarct noted in the postcentral gyrus.  MRI Moderate-sized acute left MCA infarct.  2D Echo LVEF 45-50% w/ LV golbal hypokinesis, mildly dilate LA  US Carotid:  R ICA 1-39% and L ICA 40-59%  Recommend 30 day monitor as outpt to rule out afib  LDL 64  HgbA1c 6.0  VTE prophylaxis - SCDs  No antithrombotic prior to admission, now on ASA + Plavix 3 months then ASA alone give left M2/M3 occlusion.  Therapy recommendations: HH SLP  Disposition:  Pending  Left carotid stenosis  CTA head & neck abrupt occlusion of a proximal left M3 branch, superior division. 75% atheromatous stenosis at the origin of the left ICA in the neck  US Carotid:  R ICA 1-39% and L ICA 40-59%  Vascular surgery Dr. Trula Slade  consulted  May consider TCAR next week  Need cardiology for clearance in am  Recent VTach  S/p cardioversion  Cardiology on board  Continue amiodarone 400mg  BID PO  Hypertension  Home Carvedilol 12.5mg  BID, Lisinopril 40mg  , HCTZ 25  Now on coreg and half dose of lisinopril with HCTZ  gradually normalize BP in 3-5 days  Long-term BP goal  normotensive  Hyperlipidemia  Continue home Crestor 5mg   LDL 64, goal < 70  Now on crestor 5  Continue statin at discharge  Other Stroke Risk Factors  Advanced Age >/= 80   Obesity, Body mass index is 31.89 kg/m., BMI >/= 30 associated with increased stroke risk, recommend weight loss, diet and exercise as appropriate   Coronary artery disease / MI  Congestive heart failure  AAA   Hospital day # 5   To contact Stroke Continuity provider, please refer to [email or web address] After hours, contact General Neurology

## 2021-02-02 NOTE — Plan of Care (Signed)
Problem: Education: Goal: Ability to demonstrate management of disease process will improve Outcome: Adequate for Discharge Goal: Ability to verbalize understanding of medication therapies will improve Outcome: Adequate for Discharge Goal: Individualized Educational Video(s) Outcome: Adequate for Discharge   Problem: Activity: Goal: Capacity to carry out activities will improve Outcome: Adequate for Discharge   Problem: Cardiac: Goal: Ability to achieve and maintain adequate cardiopulmonary perfusion will improve Outcome: Adequate for Discharge   Problem: Education: Goal: Knowledge of General Education information will improve Description: Including pain rating scale, medication(s)/side effects and non-pharmacologic comfort measures Outcome: Adequate for Discharge   Problem: Health Behavior/Discharge Planning: Goal: Ability to manage health-related needs will improve Outcome: Adequate for Discharge   Problem: Clinical Measurements: Goal: Ability to maintain clinical measurements within normal limits will improve Outcome: Adequate for Discharge Goal: Will remain free from infection Outcome: Adequate for Discharge Goal: Diagnostic test results will improve Outcome: Adequate for Discharge Goal: Respiratory complications will improve Outcome: Adequate for Discharge Goal: Cardiovascular complication will be avoided Outcome: Adequate for Discharge   Problem: Activity: Goal: Risk for activity intolerance will decrease Outcome: Adequate for Discharge   Problem: Nutrition: Goal: Adequate nutrition will be maintained Outcome: Adequate for Discharge   Problem: Coping: Goal: Level of anxiety will decrease Outcome: Adequate for Discharge   Problem: Elimination: Goal: Will not experience complications related to bowel motility Outcome: Adequate for Discharge Goal: Will not experience complications related to urinary retention Outcome: Adequate for Discharge   Problem: Pain  Managment: Goal: General experience of comfort will improve Outcome: Adequate for Discharge   Problem: Safety: Goal: Ability to remain free from injury will improve Outcome: Adequate for Discharge   Problem: Skin Integrity: Goal: Risk for impaired skin integrity will decrease Outcome: Adequate for Discharge   Problem: Education: Goal: Knowledge of disease or condition will improve Outcome: Adequate for Discharge Goal: Knowledge of secondary prevention will improve Outcome: Adequate for Discharge Goal: Knowledge of patient specific risk factors addressed and post discharge goals established will improve Outcome: Adequate for Discharge Goal: Individualized Educational Video(s) Outcome: Adequate for Discharge   Problem: Coping: Goal: Will verbalize positive feelings about self Outcome: Adequate for Discharge Goal: Will identify appropriate support needs Outcome: Adequate for Discharge   Problem: Health Behavior/Discharge Planning: Goal: Ability to manage health-related needs will improve Outcome: Adequate for Discharge   Problem: Self-Care: Goal: Ability to participate in self-care as condition permits will improve Outcome: Adequate for Discharge Goal: Verbalization of feelings and concerns over difficulty with self-care will improve Outcome: Adequate for Discharge Goal: Ability to communicate needs accurately will improve Outcome: Adequate for Discharge   Problem: Nutrition: Goal: Risk of aspiration will decrease Outcome: Adequate for Discharge Goal: Dietary intake will improve Outcome: Adequate for Discharge   Problem: Ischemic Stroke/TIA Tissue Perfusion: Goal: Complications of ischemic stroke/TIA will be minimized Outcome: Adequate for Discharge   Problem: Education: Goal: Ability to verbalize understanding of medication therapies will improve Outcome: Adequate for Discharge   Problem: Education: Goal: Individualized Educational Video(s) Outcome: Adequate for  Discharge   Problem: Activity: Goal: Capacity to carry out activities will improve Outcome: Adequate for Discharge   Problem: Cardiac: Goal: Ability to achieve and maintain adequate cardiopulmonary perfusion will improve Outcome: Adequate for Discharge   Problem: Education: Goal: Knowledge of General Education information will improve Description: Including pain rating scale, medication(s)/side effects and non-pharmacologic comfort measures Outcome: Adequate for Discharge   Problem: Health Behavior/Discharge Planning: Goal: Ability to manage health-related needs will improve Outcome: Adequate for Discharge  Problem: Clinical Measurements: Goal: Ability to maintain clinical measurements within normal limits will improve Outcome: Adequate for Discharge   Problem: Clinical Measurements: Goal: Diagnostic test results will improve Outcome: Adequate for Discharge   Problem: Clinical Measurements: Goal: Respiratory complications will improve Outcome: Adequate for Discharge   Problem: Clinical Measurements: Goal: Cardiovascular complication will be avoided Outcome: Adequate for Discharge   Problem: Activity: Goal: Risk for activity intolerance will decrease Outcome: Adequate for Discharge   Problem: Nutrition: Goal: Adequate nutrition will be maintained Outcome: Adequate for Discharge   Problem: Coping: Goal: Level of anxiety will decrease Outcome: Adequate for Discharge

## 2021-02-03 ENCOUNTER — Other Ambulatory Visit: Payer: Self-pay

## 2021-02-06 ENCOUNTER — Encounter (HOSPITAL_COMMUNITY): Payer: Self-pay | Admitting: Surgery

## 2021-02-06 ENCOUNTER — Other Ambulatory Visit
Admission: RE | Admit: 2021-02-06 | Discharge: 2021-02-06 | Disposition: A | Discharge status: Home / Self Care | Payer: Medicare Other | Source: Ambulatory Visit | Attending: Surgery | Admitting: Surgery

## 2021-02-06 ENCOUNTER — Other Ambulatory Visit: Payer: Self-pay

## 2021-02-06 DIAGNOSIS — Z20822 Contact with and (suspected) exposure to covid-19: Secondary | ICD-10-CM | POA: Insufficient documentation

## 2021-02-06 DIAGNOSIS — Z01812 Encounter for preprocedural laboratory examination: Secondary | ICD-10-CM | POA: Insufficient documentation

## 2021-02-06 LAB — SARS CORONAVIRUS 2 (TAT 6-24 HRS): SARS Coronavirus 2: NEGATIVE

## 2021-02-06 NOTE — Anesthesia Preprocedure Evaluation (Addendum)
Anesthesia Evaluation  Patient identified by MRN, date of birth, ID band Patient awake    Reviewed: Allergy & Precautions, NPO status , Patient's Chart, lab work & pertinent test results  Airway Mallampati: II  TM Distance: >3 FB Neck ROM: Full    Dental  (+) Teeth Intact, Dental Advisory Given   Pulmonary former smoker,    breath sounds clear to auscultation       Cardiovascular hypertension,  Rhythm:Regular Rate:Normal  Cardiac cath 01/28/21: Conclusions: 1.  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  There are minimal luminal irregularities in the LAD without significant stenosis. 2.  Widely patent ramus intermedius stent. 3.  Moderately elevated left ventricular filling pressure. Recommendations: 1.  Continue IV amiodarone and ICU monitoring in the setting of sustained ventricular tachycardia that is likely scar mediated. 2.  Gentle diuresis. 3.  Aggressive secondary prevention of coronary artery disease. 4.  EP consultation for further management of VT.   Echo 01/28/21: IMPRESSIONS   1. Left ventricular ejection fraction, by estimation, is 45 to 50%. The  left ventricle has mildly decreased function. The left ventricle  demonstrates global hypokinesis. There is mild left ventricular  hypertrophy. Left ventricular diastolic parameters  are consistent with Grade II diastolic dysfunction (pseudonormalization).  There is akinesis of the left ventricular, basal inferior segment.  2. Right ventricular systolic function is normal. The right ventricular  size is normal.  3. Left atrial size was mildly dilated.  4. The mitral valve is grossly normal. Trivial mitral valve  regurgitation. No evidence of mitral stenosis.  5. The aortic valve was not well visualized. Aortic valve regurgitation  is not visualized. No aortic stenosis is present.  6. Aortic dilatation  noted. There is mild dilatation of the aortic root,  measuring 39 mm.     Neuro/Psych    GI/Hepatic   Endo/Other    Renal/GU      Musculoskeletal   Abdominal   Peds  Hematology   Anesthesia Other Findings Patient with moderate expressive aphasia  Reproductive/Obstetrics                            Anesthesia Physical Anesthesia Plan  ASA: III  Anesthesia Plan: General   Post-op Pain Management:    Induction: Intravenous  PONV Risk Score and Plan: Ondansetron and Dexamethasone  Airway Management Planned: Oral ETT  Additional Equipment: Arterial line  Intra-op Plan:   Post-operative Plan: Extubation in OR  Informed Consent: I have reviewed the patients History and Physical, chart, labs and discussed the procedure including the risks, benefits and alternatives for the proposed anesthesia with the patient or authorized representative who has indicated his/her understanding and acceptance.     Dental advisory given  Plan Discussed with: CRNA and Anesthesiologist  Anesthesia Plan Comments: (PAT note written 02/06/2021 by Myra Gianotti, PA-C.  Recent admission for VT, s/p cardioversion 01/27/21. Acute CVA 01/30/21. On 02/02/21 EP Dr. Curt Bears wrote, "Acute stroke: Found to have carotid stenosis. Has plans for carotid artery surgery this coming Friday. At this point, his surgery seems somewhat urgent. He would be intermediate to high risk for this intermediate to high risk procedure, though he is fairly optimized with his medications, and the most recent catheterization without evidence of new or worsening coronary artery disease. No further cardiology work-up is needed prior to surgery." )       Anesthesia Quick Evaluation

## 2021-02-06 NOTE — Progress Notes (Signed)
Anesthesia Chart Review: Garrett Hunter   Case: [id number] Date/Time: 02/07/21 1008   Procedure: TRANSCAROTID ARTERY REVASCULARIZATION LEFT (Left )   Anesthesia type: General   Pre-op diagnosis: carotid stenosis   Location: [facility] OR ROOM 16 / [facility]   Surgeons: [name], MD      DISCUSSION: Patient is a 76 year old male scheduled for the above procedure.   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 LUL lung nodule on 01/30/21 CT with one year follow-up recommended.   - Admission [facility] 01/27/21-02/02/21 [facility] 01/27/21-01/28/21 with transfer to [facility] 01/28/21-02/02/21) for chest pain and dizziness after completing yard work. He took 4 ASA and called 911. EMS found him in [facility] that was refractory to 2 doses of adenosine and amiodarone. EKG in ED consistent with ventricular tachycardia. Cardiology recommended another bolus of amiodarone followed by an amiodarone gtt. WCT persisted, and he developed hypotension, so decision made to perform urgent cardioversion, which was successful, per [name]. EF 45-50% by echo. Cardiology suspected VT was scar mediated from his inferior wall (known inferior STEMI with occluded RCA and LCX); however, cardiac cath recommended to look for ischemic etiology .01/28/21 LHC showed severe 2V CAD with chronic occlusion of proximal LCX and RCA with minimal irregularities in the LAD, patent ramus stent. Continued medical therapy for CAD  recommended with EP consult for evaluation of VT. He was transferred to [facility] for EP consult with Dr. Curt Bears. Patient had not had any further VT on amiodarone, so recommendation to transition to from IV to oral amiodarone. He added that "Per the AVID trial, with an ejection fraction greater than 35%, he would likely not benefit that much from an ICD over amiodarone."  While still hospitalized, was found to be aphasic with right facial droop in the early hours of 01/30/21. CT showed "loss of gray-white differentiation consistent with acute/subacute infarct is noted in the left insula extending into the anterior left parietal lobe with cortical infarct noted in the postcentral gyrus." CTA showed left M3 branch occlusion, 42% LICA stenosis in the neck, 4 mm LUL lung nodule. TPA initiated, and he underwent emergent cerebral angiogram with attempted mechanical thrombectomy of left opercular branch (M3) after first pass (only one pass given "complicating features of his [tourtous] vascularature to avoid injury"). Given 35% LICA stenosis, vascular surgery consulted as carotid stenosis could also be contributing to acute stroke, although unclear. Medical therapy versus CEA or TCAR discussed. Given recent cardiac events, TCAR felt most appropriate. ASA and Plavix to be continued. Okay to discharge prior to surgery. Out-patient ST and 30 day cardiac monitor recommended. Chest CT in one year also recommended (through PCP) to evaluate incidental finding of LUL lung nodule.   Prior to discharge, he was re-evaluated by EP Dr. Curt Bears on 02/02/21 who wrote, "Acute stroke: Found to have carotid stenosis.  Has plans for carotid artery surgery this coming Friday.  At this point, his surgery seems somewhat urgent.  He would be intermediate to high risk for this intermediate to high risk  procedure, though he is fairly optimized with his medications, and the most recent catheterization without evidence of new or worsening coronary artery  disease.  No further cardiology work-up is needed prior to surgery."  Patient to continue Plavix and ASA for this procedure.  02/06/21 preoperative COVID-19 test is in process. Anesthesia team to evaluate on the day of surgery.    VS:  BP Readings from Last 3 Encounters:  02/02/21 (!) 152/64  01/28/21 130/69  07/16/20 (!) 144/72   Pulse Readings from Last 3 Encounters:  02/02/21 63  01/28/21 68  07/16/20 73    PROVIDERS: Derinda Late, MD is PCP  - Kathlyn Sacramento, MD is primary cardiologist - Lars Mage, MD will be EP cardiologist in [place]. Seen by Allegra Lai, MD during 01/2021 admission. - Rosalin Hawking, MD / Antony Contras, MD are neurologists  - Vinnie Level, MD is vascular surgeon (thoracoabdominal aneurysm repair). Last visti 12/26/20. He is a participant in Dr. Eddie Dibbles Ps-IDE study. One year follow-up with CT, renal duplex planned.   LABS: Labs as of 02/02/21 include: Lab Results  Component Value Date   WBC 9.7 02/02/2021   HGB 12.1 (L) 02/02/2021   HCT 36.0 (L) 02/02/2021   PLT 157 02/02/2021   GLUCOSE 99 02/02/2021   CHOL 119 01/30/2021   TRIG 85 01/30/2021   HDL 38 (L) 01/30/2021   LDLCALC 64 01/30/2021   ALT 32 01/27/2021   AST 32 01/27/2021   NA 138 02/02/2021   K 3.7 02/02/2021   CL 102 02/02/2021   CREATININE 1.03 02/02/2021   BUN 18 02/02/2021   CO2 26 02/02/2021   INR 1.0 01/27/2021   HGBA1C 6.0 (H) 01/30/2021     IMAGES: MRI Brain 01/31/21: IMPRESSION: Moderate-sized acute left MCA infarct.   CTA head/neck 01/30/21: IMPRESSION: 1. Positive CTA for emergent large vessel occlusion, with abrupt occlusion of a proximal left M3 branch, superior division. 2. 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 3. 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 4. 4 mm left upper lobe nodule, indeterminate. No follow-up  needed if patient is low-risk. Non-contrast chest CT can be considered in 12 months if patient is high-risk. This recommendation follows the consensus statement: Guidelines for Management of Incidental Pulmonary Nodules Detected on CT Images: From the Fleischner Society 2017; Radiology 2017; 284:228-243. 5. Aortic Atherosclerosis (ICD10-I70.0) and Emphysema (ICD10-J43.9).   US Renal 12/26/20 [facility]): Final Interpretation  Right: No evidence of renal artery stenosis. Kidney size and RI are WNL. There is evidence of flow in the right renal vein.  Left: No evidence of renal artery stenosis. Kidney size and RI are WNL. There is evidence of flow in the left renal vein.  Mesenteric: Normal SMA findings. 70 to 99% stenosis in the celiac artery.    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 The excluded aneurysm sac has decreased in size now measuring 4 cm. No endoleak.  - Unchanged size of 2.7 cm left adrenal adenoma.    EKG: 01/28/21: Sinus or ectopic atrial rhythm IVCD, consider atypical LBBB 12 Lead; Mason-Likar Confirmed by Neoma Laming [phone]) on 01/28/2021 4:36:41 PM    CV: Cardiac cath 01/28/21: Conclusions: 1.  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  There are minimal luminal irregularities in  the LAD without significant stenosis. 2.  Widely patent ramus intermedius stent. 3.  Moderately elevated left ventricular filling pressure. Recommendations: 1.  Continue IV amiodarone and ICU monitoring in the setting of sustained ventricular tachycardia that is likely scar mediated. 2.  Gentle diuresis. 3.  Aggressive secondary prevention of coronary artery disease. 4.  EP consultation for further management of VT.   Echo 01/28/21: IMPRESSIONS   1. Left ventricular ejection fraction, by  estimation, is 45 to 50%. The  left ventricle has mildly decreased function. The left ventricle  demonstrates global hypokinesis. There is mild left ventricular  hypertrophy. Left ventricular diastolic parameters  are consistent with Grade II diastolic dysfunction (pseudonormalization).  There is akinesis of the left ventricular, basal inferior segment.  2. Right ventricular systolic function is normal. The right ventricular  size is normal.  3. Left atrial size was mildly dilated.  4. The mitral valve is grossly normal. Trivial mitral valve  regurgitation. No evidence of mitral stenosis.  5. The aortic valve was not well visualized. Aortic valve regurgitation  is not visualized. No aortic stenosis is present.  6. Aortic dilatation noted. There is mild dilatation of the aortic root,  measuring 39 mm.    Past Medical History:  Diagnosis Date  . AAA (abdominal aortic aneurysm) (HCC)    thoracoabdominal aortic aneursym, s/p right ilio-femoral bypass 10/12/16; 3V FEVAR with SMA, RRA, and LRA stenting 11/16/16 by Dr. Sammuel Hines [facility])  . Benign neoplasm of colon    patient not sure  . CHF (congestive heart failure) (HCC)    EF 45-50%  . Chronic airway obstruction, not elsewhere classified    pt denies  . Coronary artery disease    Inferior MI in 1996 treated with TPA. Cardiac cath in 2011 at [facility] showed chronic occlusion of the proximal RCA and proximal left circumflex without obstructive disease in the LAD. Non-ST elevation myocardial infarction in October 2013 while on vacation in [place]. Cardiac catheterization showed plaque rupture in the proximal ramus with thrombus. He had balloon angioplasty done. 01/28/21 cath   . Coronary atherosclerosis of native coronary artery   . Hepatitis A    teenager, no longer a problem  . Hyperlipidemia    Intolerance to statins.  . Hypertension   . MI (myocardial infarction) ([place])    x 3 - last one 2013  . Renal artery stenosis (HCC)    s/p bilateral  renal artery angioplasty 07/26/18  . Stroke ([place]) 01/2021  . Ventricular tachycardia ([place])    s/p cardioverson 01/27/21    Past Surgical History:  Procedure Laterality Date  . ABDOMINAL AORTIC ANEURYSM REPAIR     11/16/2016  . ANGIOPLASTY    . CARDIAC CATHETERIZATION  2011  . CARDIAC CATHETERIZATION  2/14   [facility]; 95% lesion in the ramus intermedius.   [name] CATARACT EXTRACTION W/PHACO Left 12/19/2018   Procedure: CATARACT EXTRACTION PHACO AND INTRAOCULAR LENS PLACEMENT ([place])  LEFT;  Surgeon: Eulogio Bear, MD;  Location: [place];  Service: Ophthalmology;  Laterality: Left;  . CATARACT EXTRACTION W/PHACO Right 03/06/2019   Procedure: CATARACT EXTRACTION PHACO AND INTRAOCULAR LENS PLACEMENT (IOC)RIGHT;  Surgeon: Eulogio Bear, MD;  Location: [place];  Service: Ophthalmology;  Laterality: Right;  . CORONARY ANGIOPLASTY  1996   [facility] x1  . CORONARY ANGIOPLASTY  2/14   Severe restenosis in the ostial ramus. Status post angioplasty and drug-eluting stent placement with a 2.5 x 18 mm Xience drug-eluting stent  . ESOPHAGOGASTRODUODENOSCOPY N/A 08/03/2018  Procedure: ESOPHAGOGASTRODUODENOSCOPY (EGD);  Surgeon: Lin Landsman, MD;  Location: [facility] ENDOSCOPY;  Service: Gastroenterology;  Laterality: N/A;  . ESOPHAGOGASTRODUODENOSCOPY (EGD) WITH PROPOFOL N/A 11/03/2018   Procedure: ESOPHAGOGASTRODUODENOSCOPY (EGD) WITH PROPOFOL;  Surgeon: Lin Landsman, MD;  Location: [facility] ENDOSCOPY;  Service: Gastroenterology;  Laterality: N/A;  . ESOPHAGOGASTRODUODENOSCOPY (EGD) WITH PROPOFOL N/A 12/06/2018   Procedure: ESOPHAGOGASTRODUODENOSCOPY (EGD) WITH PROPOFOL;  Surgeon: Lucilla Lame, MD;  Location: [facility] ENDOSCOPY;  Service: Endoscopy;  Laterality: N/A;  . IR CT HEAD LTD  01/30/2021  . IR PERCUTANEOUS ART THROMBECTOMY/INFUSION INTRACRANIAL INC DIAG ANGIO  01/30/2021  . LEFT HEART CATH AND CORONARY ANGIOGRAPHY N/A 01/28/2021   Procedure: LEFT HEART CATH AND CORONARY ANGIOGRAPHY;   Surgeon: Nelva Bush, MD;  Location: [place] CV LAB;  Service: Cardiovascular;  Laterality: N/A;  . RADIOLOGY WITH ANESTHESIA N/A 01/30/2021   Procedure: IR WITH ANESTHESIA;  Surgeon: Luanne Bras, MD;  Location: [place];  Service: Radiology;  Laterality: N/A;  . TONSILLECTOMY AND ADENOIDECTOMY      MEDICATIONS: No current facility-administered medications for this encounter.   Derrill Memo ON 02/26/2021] amiodarone (PACERONE) 200 MG tablet  . [START ON 02/12/2021] amiodarone (PACERONE) 400 MG tablet  . amiodarone (PACERONE) 400 MG tablet  . aspirin EC 81 MG tablet  . calcium-vitamin D (OSCAL WITH D) 500-200 MG-UNIT tablet  . carvedilol (COREG) 12.5 MG tablet  . clopidogrel (PLAVIX) 75 MG tablet  . Coenzyme Q10 (COQ10 PO)  . hydrochlorothiazide (HYDRODIURIL) 25 MG tablet  . lisinopril (ZESTRIL) 20 MG tablet  . Multiple Vitamin (MULTIVITAMIN WITH MINERALS) TABS tablet  . rosuvastatin (CRESTOR) 5 MG tablet  . vitamin C (ASCORBIC ACID) 500 MG tablet    Myra Gianotti, PA-C Surgical Short Stay/Anesthesiology [facility] Phone [phone] [facility] Phone [phone] 02/06/2021 2:46 PM

## 2021-02-06 NOTE — Progress Notes (Signed)
Patient was given pre-op instructions over the phone. The opportunity was given for the patient to ask questions. No further questions asked. Patient verbalized understanding of instructions given.   Nothing to eat or drink after midnight.  7 days prior to surgery STOP taking any Aspirin (unless otherwise instructed by your surgeon), Aleve, Naproxen, Ibuprofen, Motrin, Advil, Goody's, BC's, all herbal medications, fish oil, and all vitamins.   Anesthesia Review Needed: yes, CAD, MI  PCP - [facility] Cardiologist - [place]  Chest x-ray - not needed EKG - 01/28/21 Stress Test -  ECHO - 01/28/21 Cardiac Cath - 01/28/21  Blood Thinner Instructions: Plavix continue Aspirin Instructions: continue   COVID TEST- pending result  Patient denies shortness of breath, fever, cough and chest pain at PAT appointment   All instructions explained to the patient, with a verbal understanding of the material. Patient agrees to go over the instructions while at home for a better understanding. Patient also instructed to self quarantine after being tested for COVID-19. The opportunity to ask questions was provided.

## 2021-02-07 ENCOUNTER — Inpatient Hospital Stay (HOSPITAL_COMMUNITY): Payer: Medicare Other | Admitting: Vascular Surgery

## 2021-02-07 ENCOUNTER — Encounter (HOSPITAL_COMMUNITY)
Admission: RE | Disposition: A | Discharge status: Home / Self Care | Payer: Self-pay | Source: Home / Self Care | Attending: Surgery

## 2021-02-07 ENCOUNTER — Inpatient Hospital Stay (HOSPITAL_COMMUNITY): Payer: Medicare Other

## 2021-02-07 ENCOUNTER — Encounter (HOSPITAL_COMMUNITY): Payer: Self-pay | Admitting: Surgery

## 2021-02-07 ENCOUNTER — Inpatient Hospital Stay (HOSPITAL_COMMUNITY)
Admission: RE | Admit: 2021-02-07 | Discharge: 2021-02-08 | DRG: 035 | Disposition: A | Discharge status: Home / Self Care | Payer: Medicare Other | Attending: Surgery | Admitting: Surgery

## 2021-02-07 DIAGNOSIS — Z8711 Personal history of peptic ulcer disease: Secondary | ICD-10-CM | POA: Diagnosis not present

## 2021-02-07 DIAGNOSIS — I2510 Atherosclerotic heart disease of native coronary artery without angina pectoris: Secondary | ICD-10-CM | POA: Diagnosis present

## 2021-02-07 DIAGNOSIS — I252 Old myocardial infarction: Secondary | ICD-10-CM

## 2021-02-07 DIAGNOSIS — I5022 Chronic systolic (congestive) heart failure: Secondary | ICD-10-CM | POA: Diagnosis present

## 2021-02-07 DIAGNOSIS — I739 Peripheral vascular disease, unspecified: Secondary | ICD-10-CM | POA: Diagnosis present

## 2021-02-07 DIAGNOSIS — Z9861 Coronary angioplasty status: Secondary | ICD-10-CM

## 2021-02-07 DIAGNOSIS — Z8673 Personal history of transient ischemic attack (TIA), and cerebral infarction without residual deficits: Secondary | ICD-10-CM

## 2021-02-07 DIAGNOSIS — Z8249 Family history of ischemic heart disease and other diseases of the circulatory system: Secondary | ICD-10-CM

## 2021-02-07 DIAGNOSIS — Z95820 Peripheral vascular angioplasty status with implants and grafts: Secondary | ICD-10-CM

## 2021-02-07 DIAGNOSIS — I472 Ventricular tachycardia: Secondary | ICD-10-CM | POA: Diagnosis present

## 2021-02-07 DIAGNOSIS — I110 Hypertensive heart disease with heart failure: Secondary | ICD-10-CM | POA: Diagnosis present

## 2021-02-07 DIAGNOSIS — R1310 Dysphagia, unspecified: Secondary | ICD-10-CM | POA: Diagnosis present

## 2021-02-07 DIAGNOSIS — E119 Type 2 diabetes mellitus without complications: Secondary | ICD-10-CM | POA: Diagnosis present

## 2021-02-07 DIAGNOSIS — R4701 Aphasia: Secondary | ICD-10-CM | POA: Diagnosis present

## 2021-02-07 DIAGNOSIS — Z8679 Personal history of other diseases of the circulatory system: Secondary | ICD-10-CM

## 2021-02-07 DIAGNOSIS — Z800 Family history of malignant neoplasm of digestive organs: Secondary | ICD-10-CM

## 2021-02-07 DIAGNOSIS — I714 Abdominal aortic aneurysm, without rupture: Secondary | ICD-10-CM | POA: Diagnosis present

## 2021-02-07 DIAGNOSIS — E785 Hyperlipidemia, unspecified: Secondary | ICD-10-CM | POA: Diagnosis present

## 2021-02-07 DIAGNOSIS — I6529 Occlusion and stenosis of unspecified carotid artery: Secondary | ICD-10-CM | POA: Diagnosis present

## 2021-02-07 DIAGNOSIS — Z87891 Personal history of nicotine dependence: Secondary | ICD-10-CM

## 2021-02-07 DIAGNOSIS — Z20822 Contact with and (suspected) exposure to covid-19: Secondary | ICD-10-CM | POA: Diagnosis present

## 2021-02-07 DIAGNOSIS — I6522 Occlusion and stenosis of left carotid artery: Secondary | ICD-10-CM

## 2021-02-07 HISTORY — DX: Ventricular tachycardia: I47.2

## 2021-02-07 HISTORY — DX: Ventricular tachycardia, unspecified: I47.20

## 2021-02-07 HISTORY — PX: TRANSCAROTID ARTERY REVASCULARIZATIONÂ: SHX6778

## 2021-02-07 HISTORY — DX: Atherosclerosis of renal artery: I70.1

## 2021-02-07 LAB — CBC
HCT: 38.8 % — ABNORMAL LOW (ref 39.0–52.0)
Hemoglobin: 13 g/dL (ref 13.0–17.0)
MCH: 32.9 pg (ref 26.0–34.0)
MCHC: 33.5 g/dL (ref 30.0–36.0)
MCV: 98.2 fL (ref 80.0–100.0)
Platelets: 287 10*3/uL (ref 150–400)
RBC: 3.95 MIL/uL — ABNORMAL LOW (ref 4.22–5.81)
RDW: 12.5 % (ref 11.5–15.5)
WBC: 8.7 10*3/uL (ref 4.0–10.5)
nRBC: 0 % (ref 0.0–0.2)

## 2021-02-07 LAB — COMPREHENSIVE METABOLIC PANEL
ALT: 34 U/L (ref 0–44)
AST: 24 U/L (ref 15–41)
Albumin: 4 g/dL (ref 3.5–5.0)
Alkaline Phosphatase: 85 U/L (ref 38–126)
Anion gap: 9 (ref 5–15)
BUN: 33 mg/dL — ABNORMAL HIGH (ref 8–23)
CO2: 26 mmol/L (ref 22–32)
Calcium: 9.4 mg/dL (ref 8.9–10.3)
Chloride: 100 mmol/L (ref 98–111)
Creatinine, Ser: 1.6 mg/dL — ABNORMAL HIGH (ref 0.61–1.24)
GFR, Estimated: 45 mL/min — ABNORMAL LOW (ref >60)
Glucose, Bld: 126 mg/dL — ABNORMAL HIGH (ref 70–99)
Potassium: 3.4 mmol/L — ABNORMAL LOW (ref 3.5–5.1)
Sodium: 135 mmol/L (ref 135–145)
Total Bilirubin: 0.8 mg/dL (ref 0.3–1.2)
Total Protein: 6.9 g/dL (ref 6.5–8.1)

## 2021-02-07 LAB — SURGICAL PCR SCREEN
MRSA, PCR: NEGATIVE
Staphylococcus aureus: NEGATIVE

## 2021-02-07 LAB — BLOOD GAS, ARTERIAL
Acid-Base Excess: 2.1 mmol/L — ABNORMAL HIGH (ref 0.0–2.0)
Bicarbonate: 26.1 mmol/L (ref 20.0–28.0)
Drawn by: 35458
FIO2: 21
O2 Saturation: 95.6 %
Patient temperature: 37
pCO2 arterial: 40.5 mmHg (ref 32.0–48.0)
pH, Arterial: 7.426 (ref 7.350–7.450)
pO2, Arterial: 80.9 mmHg — ABNORMAL LOW (ref 83.0–108.0)

## 2021-02-07 LAB — URINALYSIS, ROUTINE W REFLEX MICROSCOPIC
Bilirubin Urine: NEGATIVE
Glucose, UA: NEGATIVE mg/dL
Hgb urine dipstick: NEGATIVE
Ketones, ur: NEGATIVE mg/dL
Leukocytes,Ua: NEGATIVE
Nitrite: NEGATIVE
Protein, ur: NEGATIVE mg/dL
Specific Gravity, Urine: 1.019 (ref 1.005–1.030)
pH: 6 (ref 5.0–8.0)

## 2021-02-07 LAB — APTT: aPTT: 28 seconds (ref 24–36)

## 2021-02-07 LAB — PROTIME-INR
INR: 1 (ref 0.8–1.2)
Prothrombin Time: 13.6 seconds (ref 11.4–15.2)

## 2021-02-07 LAB — TYPE AND SCREEN
ABO/RH(D): O NEG
Antibody Screen: NEGATIVE

## 2021-02-07 SURGERY — TRANSCAROTID ARTERY REVASCULARIZATION (TCAR)
Anesthesia: General | Site: Neck | Laterality: Left

## 2021-02-07 MED ORDER — GLYCOPYRROLATE PF 0.2 MG/ML IJ SOSY
PREFILLED_SYRINGE | INTRAMUSCULAR | Status: DC | PRN
  Administered 2021-02-07 (×2): .2 mg via INTRAVENOUS

## 2021-02-07 MED ORDER — EPHEDRINE SULFATE-NACL 50-0.9 MG/10ML-% IV SOSY
PREFILLED_SYRINGE | INTRAVENOUS | Status: DC | PRN
  Administered 2021-02-07: 10 mg via INTRAVENOUS
  Administered 2021-02-07: 20 mg via INTRAVENOUS
  Administered 2021-02-07 (×2): 10 mg via INTRAVENOUS

## 2021-02-07 MED ORDER — LACTATED RINGERS IV SOLN: INTRAVENOUS | Status: DC | PRN

## 2021-02-07 MED ORDER — ACETAMINOPHEN 325 MG RE SUPP
325.0000 mg | RECTAL | Status: DC | PRN
  Filled 2021-02-07: qty 2

## 2021-02-07 MED ORDER — IODIXANOL 320 MG/ML IV SOLN
INTRAVENOUS | Status: DC | PRN
  Administered 2021-02-07: 18 mL

## 2021-02-07 MED ORDER — SUGAMMADEX SODIUM 200 MG/2ML IV SOLN
INTRAVENOUS | Status: DC | PRN
  Administered 2021-02-07: 180 mg via INTRAVENOUS

## 2021-02-07 MED ORDER — HEPARIN SODIUM (PORCINE) 1000 UNIT/ML IJ SOLN
INTRAMUSCULAR | Status: DC | PRN
  Administered 2021-02-07: 10000 [IU] via INTRAVENOUS

## 2021-02-07 MED ORDER — SODIUM CHLORIDE 0.9 % IV SOLN: INTRAVENOUS | Status: DC

## 2021-02-07 MED ORDER — SODIUM CHLORIDE 0.9 % IV SOLN
INTRAVENOUS | Status: AC
  Filled 2021-02-07: qty 1.2

## 2021-02-07 MED ORDER — CHLORHEXIDINE GLUCONATE 0.12 % MT SOLN
15.0000 mL | Freq: Once | OROMUCOSAL | Status: AC
  Administered 2021-02-07: 15 mL via OROMUCOSAL
  Filled 2021-02-07: qty 15

## 2021-02-07 MED ORDER — DEXAMETHASONE SODIUM PHOSPHATE 10 MG/ML IJ SOLN
INTRAMUSCULAR | Status: DC | PRN
  Administered 2021-02-07: 10 mg via INTRAVENOUS

## 2021-02-07 MED ORDER — LIDOCAINE 2% (20 MG/ML) 5 ML SYRINGE
INTRAMUSCULAR | Status: DC | PRN
  Administered 2021-02-07: 50 mg via INTRAVENOUS

## 2021-02-07 MED ORDER — ASPIRIN EC 81 MG PO TBEC: 81.0000 mg | DELAYED_RELEASE_TABLET | Freq: Every day | ORAL | Status: DC

## 2021-02-07 MED ORDER — HYDRALAZINE HCL 20 MG/ML IJ SOLN: 5.0000 mg | INTRAMUSCULAR | Status: DC | PRN

## 2021-02-07 MED ORDER — ORAL CARE MOUTH RINSE: 15.0000 mL | Freq: Once | OROMUCOSAL | Status: AC

## 2021-02-07 MED ORDER — CEFAZOLIN SODIUM-DEXTROSE 2-4 GM/100ML-% IV SOLN
2.0000 g | INTRAVENOUS | Status: AC
  Administered 2021-02-07: 2 g via INTRAVENOUS
  Filled 2021-02-07: qty 100

## 2021-02-07 MED ORDER — MAGNESIUM SULFATE 2 GM/50ML IV SOLN: 2.0000 g | Freq: Every day | INTRAVENOUS | Status: DC | PRN

## 2021-02-07 MED ORDER — CEFAZOLIN SODIUM-DEXTROSE 2-4 GM/100ML-% IV SOLN
2.0000 g | Freq: Three times a day (TID) | INTRAVENOUS | Status: AC
  Administered 2021-02-07 (×2): 2 g via INTRAVENOUS
  Filled 2021-02-07: qty 100

## 2021-02-07 MED ORDER — ONDANSETRON HCL 4 MG/2ML IJ SOLN: 4.0000 mg | Freq: Once | INTRAMUSCULAR | Status: DC | PRN

## 2021-02-07 MED ORDER — PHENOL 1.4 % MT LIQD: 1.0000 | OROMUCOSAL | Status: DC | PRN

## 2021-02-07 MED ORDER — METOPROLOL TARTRATE 5 MG/5ML IV SOLN: 2.0000 mg | INTRAVENOUS | Status: DC | PRN

## 2021-02-07 MED ORDER — OXYCODONE HCL 5 MG PO TABS: 5.0000 mg | ORAL_TABLET | Freq: Once | ORAL | Status: DC | PRN

## 2021-02-07 MED ORDER — SODIUM CHLORIDE 0.9 % IV SOLN: 500.0000 mL | Freq: Once | INTRAVENOUS | Status: DC | PRN

## 2021-02-07 MED ORDER — ACETAMINOPHEN 325 MG PO TABS
325.0000 mg | ORAL_TABLET | ORAL | Status: DC | PRN
Start: 2021-02-07 — End: 2021-02-08

## 2021-02-07 MED ORDER — LACTATED RINGERS IV SOLN: INTRAVENOUS | Status: DC

## 2021-02-07 MED ORDER — CHLORHEXIDINE GLUCONATE CLOTH 2 % EX PADS: 6.0000 | MEDICATED_PAD | Freq: Once | CUTANEOUS | Status: DC

## 2021-02-07 MED ORDER — PROPOFOL 10 MG/ML IV BOLUS
INTRAVENOUS | Status: AC
  Filled 2021-02-07: qty 20

## 2021-02-07 MED ORDER — PANTOPRAZOLE SODIUM 40 MG PO TBEC
40.0000 mg | DELAYED_RELEASE_TABLET | Freq: Every day | ORAL | Status: DC
  Administered 2021-02-07 – 2021-02-08 (×2): 40 mg via ORAL
  Filled 2021-02-07 (×3): qty 1

## 2021-02-07 MED ORDER — AMIODARONE HCL 200 MG PO TABS
400.0000 mg | ORAL_TABLET | Freq: Two times a day (BID) | ORAL | Status: DC
  Administered 2021-02-07 – 2021-02-08 (×2): 400 mg via ORAL
  Filled 2021-02-07 (×2): qty 2

## 2021-02-07 MED ORDER — LABETALOL HCL 5 MG/ML IV SOLN: 10.0000 mg | INTRAVENOUS | Status: DC | PRN

## 2021-02-07 MED ORDER — ROSUVASTATIN CALCIUM 5 MG PO TABS
5.0000 mg | ORAL_TABLET | Freq: Every day | ORAL | Status: DC
  Administered 2021-02-08: 5 mg via ORAL
  Filled 2021-02-07: qty 1

## 2021-02-07 MED ORDER — OXYCODONE-ACETAMINOPHEN 5-325 MG PO TABS: 1.0000 | ORAL_TABLET | ORAL | Status: DC | PRN

## 2021-02-07 MED ORDER — CARVEDILOL 12.5 MG PO TABS
12.5000 mg | ORAL_TABLET | Freq: Two times a day (BID) | ORAL | Status: DC
  Administered 2021-02-07 – 2021-02-08 (×2): 12.5 mg via ORAL
  Filled 2021-02-07 (×2): qty 1

## 2021-02-07 MED ORDER — ONDANSETRON HCL 4 MG/2ML IJ SOLN
INTRAMUSCULAR | Status: DC | PRN
  Administered 2021-02-07: 4 mg via INTRAVENOUS

## 2021-02-07 MED ORDER — 0.9 % SODIUM CHLORIDE (POUR BTL) OPTIME
TOPICAL | Status: DC | PRN
  Administered 2021-02-07: 1000 mL

## 2021-02-07 MED ORDER — MORPHINE SULFATE (PF) 2 MG/ML IV SOLN: 2.0000 mg | INTRAVENOUS | Status: DC | PRN

## 2021-02-07 MED ORDER — GUAIFENESIN-DM 100-10 MG/5ML PO SYRP: 15.0000 mL | ORAL_SOLUTION | ORAL | Status: DC | PRN

## 2021-02-07 MED ORDER — FENTANYL CITRATE (PF) 250 MCG/5ML IJ SOLN
INTRAMUSCULAR | Status: AC
  Filled 2021-02-07: qty 5

## 2021-02-07 MED ORDER — ROCURONIUM BROMIDE 10 MG/ML (PF) SYRINGE
PREFILLED_SYRINGE | INTRAVENOUS | Status: DC | PRN
  Administered 2021-02-07: 70 mg via INTRAVENOUS

## 2021-02-07 MED ORDER — POTASSIUM CHLORIDE CRYS ER 20 MEQ PO TBCR: 20.0000 meq | EXTENDED_RELEASE_TABLET | Freq: Every day | ORAL | Status: DC | PRN

## 2021-02-07 MED ORDER — FENTANYL CITRATE (PF) 250 MCG/5ML IJ SOLN
INTRAMUSCULAR | Status: DC | PRN
  Administered 2021-02-07: 100 ug via INTRAVENOUS

## 2021-02-07 MED ORDER — HEMOSTATIC AGENTS (NO CHARGE) OPTIME
TOPICAL | Status: DC | PRN
  Administered 2021-02-07: 1 via TOPICAL

## 2021-02-07 MED ORDER — PROTAMINE SULFATE 10 MG/ML IV SOLN
INTRAVENOUS | Status: DC | PRN
  Administered 2021-02-07: 50 mg via INTRAVENOUS

## 2021-02-07 MED ORDER — SODIUM CHLORIDE 0.9 % IV SOLN
INTRAVENOUS | Status: DC | PRN
  Administered 2021-02-07: 500 mL

## 2021-02-07 MED ORDER — HYDROCHLOROTHIAZIDE 25 MG PO TABS
25.0000 mg | ORAL_TABLET | Freq: Every day | ORAL | Status: DC
  Administered 2021-02-07 – 2021-02-08 (×2): 25 mg via ORAL
  Filled 2021-02-07 (×2): qty 1

## 2021-02-07 MED ORDER — NYSTATIN 100000 UNIT/ML MT SUSP: 5.0000 mL | Freq: Four times a day (QID) | OROMUCOSAL | 0 refills | Status: DC

## 2021-02-07 MED ORDER — DOCUSATE SODIUM 100 MG PO CAPS
100.0000 mg | ORAL_CAPSULE | Freq: Every day | ORAL | Status: DC
  Administered 2021-02-08: 100 mg via ORAL
  Filled 2021-02-07: qty 1

## 2021-02-07 MED ORDER — CLOPIDOGREL BISULFATE 75 MG PO TABS
75.0000 mg | ORAL_TABLET | Freq: Every day | ORAL | Status: DC
  Administered 2021-02-08: 75 mg via ORAL
  Filled 2021-02-07: qty 1

## 2021-02-07 MED ORDER — PHENYLEPHRINE 40 MCG/ML (10ML) SYRINGE FOR IV PUSH (FOR BLOOD PRESSURE SUPPORT)
PREFILLED_SYRINGE | INTRAVENOUS | Status: DC | PRN
  Administered 2021-02-07: 120 ug via INTRAVENOUS
  Administered 2021-02-07: 160 ug via INTRAVENOUS

## 2021-02-07 MED ORDER — ONDANSETRON HCL 4 MG/2ML IJ SOLN: 4.0000 mg | Freq: Four times a day (QID) | INTRAMUSCULAR | Status: DC | PRN

## 2021-02-07 MED ORDER — FENTANYL CITRATE (PF) 100 MCG/2ML IJ SOLN: 25.0000 ug | INTRAMUSCULAR | Status: DC | PRN

## 2021-02-07 MED ORDER — OXYCODONE HCL 5 MG/5ML PO SOLN: 5.0000 mg | Freq: Once | ORAL | Status: DC | PRN

## 2021-02-07 MED ORDER — ALUM & MAG HYDROXIDE-SIMETH 200-200-20 MG/5ML PO SUSP
15.0000 mL | ORAL | Status: DC | PRN
Start: 2021-02-07 — End: 2021-02-08

## 2021-02-07 MED ORDER — ASPIRIN EC 81 MG PO TBEC
81.0000 mg | DELAYED_RELEASE_TABLET | Freq: Every day | ORAL | Status: DC
  Administered 2021-02-08: 81 mg via ORAL
  Filled 2021-02-07 (×2): qty 1

## 2021-02-07 MED ORDER — ALBUMIN HUMAN 5 % IV SOLN: INTRAVENOUS | Status: DC | PRN

## 2021-02-07 MED ORDER — PROPOFOL 10 MG/ML IV BOLUS
INTRAVENOUS | Status: DC | PRN
  Administered 2021-02-07: 130 mg via INTRAVENOUS

## 2021-02-07 MED ORDER — CALCIUM CHLORIDE 10 % IV SOLN
INTRAVENOUS | Status: DC | PRN
  Administered 2021-02-07: 500 mg via INTRAVENOUS

## 2021-02-07 MED ORDER — PHENYLEPHRINE HCL-NACL 10-0.9 MG/250ML-% IV SOLN
INTRAVENOUS | Status: DC | PRN
  Administered 2021-02-07: 20 ug/min via INTRAVENOUS

## 2021-02-07 MED ORDER — CEFAZOLIN SODIUM-DEXTROSE 2-4 GM/100ML-% IV SOLN
INTRAVENOUS | Status: AC
  Filled 2021-02-07: qty 100

## 2021-02-07 SURGICAL SUPPLY — 50 items
BAG BANDED W/RUBBER/TAPE 36X54 (MISCELLANEOUS) ×2 IMPLANT
BALLN STERLING RX 6X30X80 (BALLOONS) ×2
BALLOON STERLING RX 6X30X80 (BALLOONS) ×1 IMPLANT
CANISTER SUCT 3000ML PPV (MISCELLANEOUS) ×2 IMPLANT
CATH SUCT 10FR WHISTLE TIP (CATHETERS) ×2 IMPLANT
CLIP VESOCCLUDE MED 6/CT (CLIP) ×2 IMPLANT
CLIP VESOCCLUDE SM WIDE 6/CT (CLIP) ×2 IMPLANT
COVER DOME SNAP 22 D (MISCELLANEOUS) ×2 IMPLANT
COVER PROBE W GEL 5X96 (DRAPES) ×2 IMPLANT
COVER WAND RF STERILE (DRAPES) ×2 IMPLANT
DERMABOND ADVANCED (GAUZE/BANDAGES/DRESSINGS) ×2
DERMABOND ADVANCED .7 DNX12 (GAUZE/BANDAGES/DRESSINGS) ×2 IMPLANT
DRAPE FEMORAL ANGIO 80X135IN (DRAPES) ×2 IMPLANT
ELECT REM PT RETURN 9FT ADLT (ELECTROSURGICAL) ×2
ELECTRODE REM PT RTRN 9FT ADLT (ELECTROSURGICAL) ×1 IMPLANT
GLOVE BIOGEL PI IND STRL 7.5 (GLOVE) ×1 IMPLANT
GLOVE BIOGEL PI INDICATOR 7.5 (GLOVE) ×1
GLOVE SURG SS PI 7.5 STRL IVOR (GLOVE) ×2 IMPLANT
GOWN STRL REUS W/ TWL LRG LVL3 (GOWN DISPOSABLE) ×2 IMPLANT
GOWN STRL REUS W/ TWL XL LVL3 (GOWN DISPOSABLE) ×1 IMPLANT
GOWN STRL REUS W/TWL LRG LVL3 (GOWN DISPOSABLE) ×2
GOWN STRL REUS W/TWL XL LVL3 (GOWN DISPOSABLE) ×1
GUIDEWIRE ENROUTE 0.014 (WIRE) ×2 IMPLANT
HEMOSTAT SNOW SURGICEL 2X4 (HEMOSTASIS) IMPLANT
INTRODUCER KIT GALT 7CM (INTRODUCER) ×2
KIT BASIN OR (CUSTOM PROCEDURE TRAY) ×2 IMPLANT
KIT ENCORE 26 ADVANTAGE (KITS) ×2 IMPLANT
KIT INTRODUCER GALT 7 (INTRODUCER) ×2 IMPLANT
KIT TURNOVER KIT B (KITS) ×2 IMPLANT
NEEDLE HYPO 25GX1X1/2 BEV (NEEDLE) IMPLANT
PACK CAROTID (CUSTOM PROCEDURE TRAY) ×2 IMPLANT
POSITIONER HEAD DONUT 9IN (MISCELLANEOUS) ×2 IMPLANT
SET MICROPUNCTURE 5F STIFF (MISCELLANEOUS) ×4 IMPLANT
STENT TRANSCAROTID SYSTEM 9X40 (Permanent Stent) ×2 IMPLANT
SURGIFLO W/THROMBIN 8M KIT (HEMOSTASIS) ×2 IMPLANT
SUT PROLENE 5 0 C 1 24 (SUTURE) ×4 IMPLANT
SUT PROLENE 6 0 BV (SUTURE) IMPLANT
SUT SILK 2 0 PERMA HAND 18 BK (SUTURE) ×2 IMPLANT
SUT SILK 2 0 SH (SUTURE) ×4 IMPLANT
SUT VIC AB 3-0 SH 27 (SUTURE) ×2
SUT VIC AB 3-0 SH 27X BRD (SUTURE) ×2 IMPLANT
SUT VIC AB 4-0 PS2 27 (SUTURE) ×2 IMPLANT
SYR 10ML LL (SYRINGE) ×6 IMPLANT
SYR 20ML LL LF (SYRINGE) ×2 IMPLANT
SYR CONTROL 10ML LL (SYRINGE) IMPLANT
SYSTEM TRANSCAROTID NEUROPRTCT (MISCELLANEOUS) ×1 IMPLANT
TOWEL GREEN STERILE (TOWEL DISPOSABLE) ×2 IMPLANT
TRANSCAROTID NEUROPROTECT SYS (MISCELLANEOUS) ×2
WATER STERILE IRR 1000ML POUR (IV SOLUTION) ×2 IMPLANT
WIRE BENTSON .035X145CM (WIRE) ×2 IMPLANT

## 2021-02-07 NOTE — Anesthesia Procedure Notes (Signed)
Procedure Name: Intubation Date/Time: 02/07/2021 11:11 AM Performed by: Thelma Comp, CRNA Pre-anesthesia Checklist: Patient identified, Emergency Drugs available, Suction available and Patient being monitored Patient Re-evaluated:Patient Re-evaluated prior to induction Oxygen Delivery Method: Circle System Utilized Preoxygenation: Pre-oxygenation with 100% oxygen Induction Type: IV induction Ventilation: Mask ventilation without difficulty Laryngoscope Size: Mac and 4 Grade View: Grade II Tube type: Oral Tube size: 7.5 mm Number of attempts: 1 Airway Equipment and Method: Stylet and Oral airway Placement Confirmation: ETT inserted through vocal cords under direct vision,  positive ETCO2 and breath sounds checked- equal and bilateral Secured at: 23 cm Tube secured with: Tape Dental Injury: Teeth and Oropharynx as per pre-operative assessment

## 2021-02-07 NOTE — Transfer of Care (Signed)
Immediate Anesthesia Transfer of Care Note  Patient: Garrett Hunter  Procedure(s) Performed: Dorthula Perfect ARTERY REVASCULARIZATION LEFT (Left Neck)  Patient Location: PACU  Anesthesia Type:General  Level of Consciousness: awake, alert  and patient cooperative  Airway & Oxygen Therapy: Patient Spontanous Breathing and Patient connected to face mask oxygen  Post-op Assessment: Report given to RN, Post -op Vital signs reviewed and stable, Patient moving all extremities, Patient moving all extremities X 4 and Patient able to stick tongue midline  Post vital signs: Reviewed and stable  Last Vitals:  Vitals Value Taken Time  BP 128/65 02/07/21 1244  Temp    Pulse 72 02/07/21 1248  Resp 17 02/07/21 1248  SpO2 100 % 02/07/21 1248  Vitals shown include unvalidated device data.  Last Pain:  Vitals:   02/07/21 0821  TempSrc:   PainSc: 0-No pain      Patients Stated Pain Goal: 2 (62/44/69 5072)  Complications: No complications documented.

## 2021-02-07 NOTE — Anesthesia Postprocedure Evaluation (Signed)
Anesthesia Post Note  Patient: Garrett Hunter  Procedure(s) Performed: Dorthula Perfect ARTERY REVASCULARIZATION LEFT (Left Neck)     Patient location during evaluation: PACU Anesthesia Type: General Level of consciousness: awake and alert Pain management: pain level controlled Vital Signs Assessment: post-procedure vital signs reviewed and stable Respiratory status: spontaneous breathing, nonlabored ventilation, respiratory function stable and patient connected to nasal cannula oxygen Cardiovascular status: blood pressure returned to baseline and stable Postop Assessment: no apparent nausea or vomiting Anesthetic complications: no   No complications documented.  Last Vitals:  Vitals:   02/07/21 1950 02/07/21 2000  BP: 118/69   Pulse: 70 71  Resp: 18   Temp: 36.7 C   SpO2: 98% 93%    Last Pain:  Vitals:   02/07/21 1950  TempSrc: Oral  PainSc:                  Kamisha Ell COKER

## 2021-02-07 NOTE — Interval H&P Note (Signed)
History and Physical Interval Note:  02/07/2021 10:25 AM  Garrett Hunter  has presented today for surgery, with the diagnosis of carotid stenosis.  The various methods of treatment have been discussed with the patient and family. After consideration of risks, benefits and other options for treatment, the patient has consented to  Procedure(s): TRANSCAROTID ARTERY REVASCULARIZATION LEFT (Left) as a surgical intervention.  The patient's history has been reviewed, patient examined, no change in status, stable for surgery.  I have reviewed the patient's chart and labs.  Questions were answered to the patient's satisfaction.     Annamarie Major

## 2021-02-07 NOTE — Op Note (Signed)
Patient name: Garrett Hunter MRN: [id number] DOB: 06-13-1946 Sex: male  02/07/2021 Pre-operative Diagnosis: Symptomatic left carotid stenosis Post-operative diagnosis:  Same Surgeon:  Annamarie Major Assistants: Arlee Muslim Procedure:   #1: Left carotid stent (TCAR   #2: Flow reversal neuro protection   #3: Ultrasound guided cannulation of the right common femoral vein Anesthesia: General Blood Loss: Minimal Specimens: None  Findings: 65% stenosis, resolved after stenting Stent:ENROUTE 9x40 Predilation balloon: 6 x 30 Procedure time: 45 minutes Contrast: 18 cc Dose area: 119.2 Fluoroscopy time: 2.5 minutes  Indications: This is a 75 year old gentleman who suffered a left brain stroke.  His symptoms are expressive aphasia and dysphagia.  CT scan showed a approximate 60% left carotid stenosis which was potentially felt to be the etiology of his stroke.  We discussed treatment options and decided to proceed with transcarotid artery revascularization with stenting.  Procedure:  The patient was identified in the holding area and taken to [facility]  The patient was then placed supine on the table. general anesthesia was administered.  The patient was prepped and draped in the usual sterile fashion.  A time out was called and antibiotics were administered.  A PA was necessary to expedite the procedure and assist with technical details.  Ultrasound was used to evaluate the right common femoral vein which was widely patent.  It was cannulated under ultrasound guidance with a micropuncture needle.  A 018 wire was inserted followed by placement of a micropuncture sheath.  An 035 wire was inserted and the TCAR venous sheath was placed.  Ultrasound was used to evaluate the location of the left common carotid artery in the supraclavicular space.  A transverse incision was made anterior to the carotid artery.  Cautery was used divide subcutaneous tissue.  The platysma muscle was divided with  cautery.  I then identified the avascular plane between the 2 heads of the sternocleidomastoid.  Through this plane identify the common carotid artery.  It was dissected out and then encircled with a umbilical tape and a Potts blue vessel loop.  The patient was fully heparinized.  Next, a 50U stitch Prolene was placed in the adventitia of the common carotid artery.  A micropuncture needle was then inserted followed by placement of an 018 wire up to the mark on the wire.  A micropuncture sheath was then inserted for 3 cm.  A contrast injection was performed which located the carotid bifurcation.  A carotid Amplatz wire was then inserted up to the carotid bifurcation and the TCAR sheath was placed.  The sheath was sutured into position with 2-0 silk suture x2.  The flow reversal tubing was then connected.  Passive flow reversal was confirmed with a saline flush.  Next, a TCAR timeout was performed.  The ACT was 290.  Active flow reversal was then initiated and confirmed with a saline flush.  A 018 wire loaded onto a 6 x 30 balloon was then inserted.  The wire was then navigated across the lesion into the internal carotid artery.  I then performed predilation using a 6 x 30 balloon.  There was no hemodynamic response.  Next, a 9 x 40 stent was inserted and then deployed across the lesion.  I waited 2 minutes and then performed completion imaging in different obliques.  The stent was widely patent.  There was no evidence of dissection.  The wire was then removed.  Active flow reversal was discontinued.  Blood from the tubing was returned  to the venous sheath and the flow reversal tubing was removed.  I then removed the TCAR sheath from the carotid artery and secured the arteriotomy closed with the previously placed 5-0 Prolene.  Doppler was used to evaluate the signal in the common carotid artery which was triphasic.  50 mg of protamine was then administered.  The sheath in the groin was removed and manual pressure was  held for hemostasis.  The neck wound was then irrigated.  Hemostasis was achieved.  The platysma muscle was reapproximated with 3-0 Vicryl and the skin was closed with 3-0 Vicryl followed by Dermabond.  The patient was successfully extubated, found to be at his neurologic baseline and taken to recovery in stable condition.   Disposition: To PACU stable.   Theotis Burrow, M.D., [facility] [place] Office: [phone] Pager:  [phone]

## 2021-02-08 LAB — BASIC METABOLIC PANEL
Anion gap: 10 (ref 5–15)
BUN: 23 mg/dL (ref 8–23)
CO2: 27 mmol/L (ref 22–32)
Calcium: 9.6 mg/dL (ref 8.9–10.3)
Chloride: 99 mmol/L (ref 98–111)
Creatinine, Ser: 1.22 mg/dL (ref 0.61–1.24)
GFR, Estimated: >60 mL/min (ref >60)
Glucose, Bld: 138 mg/dL — ABNORMAL HIGH (ref 70–99)
Potassium: 3.6 mmol/L (ref 3.5–5.1)
Sodium: 136 mmol/L (ref 135–145)

## 2021-02-08 LAB — CBC
HCT: 34.2 % — ABNORMAL LOW (ref 39.0–52.0)
Hemoglobin: 11.6 g/dL — ABNORMAL LOW (ref 13.0–17.0)
MCH: 33 pg (ref 26.0–34.0)
MCHC: 33.9 g/dL (ref 30.0–36.0)
MCV: 97.2 fL (ref 80.0–100.0)
Platelets: 281 10*3/uL (ref 150–400)
RBC: 3.52 MIL/uL — ABNORMAL LOW (ref 4.22–5.81)
RDW: 12.8 % (ref 11.5–15.5)
WBC: 14.5 10*3/uL — ABNORMAL HIGH (ref 4.0–10.5)
nRBC: 0 % (ref 0.0–0.2)

## 2021-02-08 LAB — POCT ACTIVATED CLOTTING TIME: Activated Clotting Time: 291 seconds

## 2021-02-08 MED ORDER — OXYCODONE-ACETAMINOPHEN 5-325 MG PO TABS: 1.0000 | ORAL_TABLET | Freq: Four times a day (QID) | ORAL | 0 refills | Status: DC | PRN

## 2021-02-08 NOTE — Progress Notes (Addendum)
  Progress Note    02/08/2021 7:14 AM 1 Day Post-Op  Subjective:  No complaints; swallowing ok.  Has not ambulated.  Has voided.  Not having much pain.  Afebrile HR 60's-70's  768'G-881'J systolic 03% RA  Gtts:  None  Vitals:   02/07/21 2330 02/08/21 0416  BP: 123/60 (!) 109/59  Pulse: 69 62  Resp: 16 18  Temp: 98.6 F (37 C) 98.6 F (37 C)  SpO2: 95% 97%     Physical Exam: Neuro:  Moving all extremities equally; tongue is midline.  Pt still with some expressive aphasia Lungs:  Non labored Incision:  Left neck incision is clean and dry without hematoma; right groin is soft without hematoma  CBC    Component Value Date/Time   WBC 14.5 (H) 02/08/2021 0500   RBC 3.52 (L) 02/08/2021 0500   HGB 11.6 (L) 02/08/2021 0500   HGB 12.1 (L) 09/12/2018 1425   HCT 34.2 (L) 02/08/2021 0500   HCT 36.0 (L) 09/12/2018 1425   PLT 281 02/08/2021 0500   PLT 296 09/12/2018 1425   MCV 97.2 02/08/2021 0500   MCV 96 09/12/2018 1425   MCH 33.0 02/08/2021 0500   MCHC 33.9 02/08/2021 0500   RDW 12.8 02/08/2021 0500   RDW 13.1 09/12/2018 1425   LYMPHSABS 2.2 11/21/2012 1633   EOSABS 0.2 11/21/2012 1633   BASOSABS 0.0 11/21/2012 1633    BMET    Component Value Date/Time   NA 136 02/08/2021 0500   NA 141 11/25/2012 0405   K 3.6 02/08/2021 0500   K 4.6 11/25/2012 0405   CL 99 02/08/2021 0500   CL 106 11/25/2012 0405   CO2 27 02/08/2021 0500   CO2 29 11/25/2012 0405   GLUCOSE 138 (H) 02/08/2021 0500   GLUCOSE 99 11/25/2012 0405   BUN 23 02/08/2021 0500   BUN 13 11/25/2012 0405   CREATININE 1.22 02/08/2021 0500   CREATININE 0.92 11/25/2012 0405   CALCIUM 9.6 02/08/2021 0500   CALCIUM 9.1 11/25/2012 0405   GFRNONAA >60 02/08/2021 0500   GFRNONAA >60 11/25/2012 0405   GFRAA >60 08/03/2018 0442   GFRAA >60 11/25/2012 0405     Intake/Output Summary (Last 24 hours) at 02/08/2021 0714 Last data filed at 02/08/2021 0308 Gross per 24 hour  Intake 1050 ml  Output 1120 ml  Net -70  ml     Assessment/Plan:  This is a 75 y.o. male who is s/p left TCAR 1 Day Post-Op  -pt is doing well this am. -pt neuro exam is in tact -creatinine improved from yesterday to 1.22 from 1.6 -pt has not ambulated-pt needs to walk -pt has voided -f/u with [facility] in 4 weeks with duplex -continue asa/statin/plavix -PDMP reviewed   Leontine Locket, PA-C [facility] [phone]   Agree with above.  Still with some aphasia maybe be slightly worse per pt but fairly similar.  No hematoma neck. UE LE 5/5 motor  D/c home  Ruta Hinds, MD [facility] [place] Office: [phone]

## 2021-02-08 NOTE — Progress Notes (Signed)
Discharge orders provided to patient. All medications, follow up appointments, and discharge instructions provided. IV out. Monitor off CCMD notified. Discharging home with wife.  Era Bumpers, RN

## 2021-02-08 NOTE — Discharge Instructions (Signed)
   [facility]  Discharge Instructions   Carotid Surgery  Please refer to the following instructions for your post-procedure care. Your surgeon or physician assistant will discuss any changes with you.  Activity  You are encouraged to walk as much as you can. You can slowly return to normal activities but must avoid strenuous activity and heavy lifting until your doctor tell you it's OK. Avoid activities such as vacuuming or swinging a golf club. You can drive after one week if you are comfortable and you are no longer taking prescription pain medications. It is normal to feel tired for serval weeks after your surgery. It is also normal to have difficulty with sleep habits, eating, and bowel movements after surgery. These will go away with time.  Bathing/Showering  You may shower after you come home. Do not soak in a bathtub, hot tub, or swim until the incision heals completely.  Incision Care  Shower every day. Clean your incision with mild soap and water. Pat the area dry with a clean towel. You do not need a bandage unless otherwise instructed. Do not apply any ointments or creams to your incision. You may have skin glue on your incision. Do not peel it off. It will come off on its own in about one week. Your incision may feel thickened and raised for several weeks after your surgery. This is normal and the skin will soften over time. For Men Only: It's OK to shave around the incision but do not shave the incision itself for 2 weeks. It is common to have numbness under your chin that could last for several months.  Diet  Resume your normal diet. There are no special food restrictions following this procedure. A low fat/low cholesterol diet is recommended for all patients with vascular disease. In order to heal from your surgery, it is CRITICAL to get adequate nutrition. Your body requires vitamins, minerals, and protein. Vegetables are the best source of  vitamins and minerals. Vegetables also provide the perfect balance of protein. Processed food has little nutritional value, so try to avoid this.        Medications  Resume taking all of your medications unless your doctor or physician assistant tells you not to. If your incision is causing pain, you may take over-the- counter pain relievers such as acetaminophen (Tylenol). If you were prescribed a stronger pain medication, please be aware these medications can cause nausea and constipation. Prevent nausea by taking the medication with a snack or meal. Avoid constipation by drinking plenty of fluids and eating foods with a high amount of fiber, such as fruits, vegetables, and grains. Do not take Tylenol if you are taking prescription pain medications.  Follow Up  Our office will schedule a follow up appointment 2-3 weeks following discharge.  Please call us immediately for any of the following conditions  Increased pain, redness, drainage (pus) from your incision site. Fever of 101 degrees or higher. If you should develop stroke (slurred speech, difficulty swallowing, weakness on one side of your body, loss of vision) you should call 911 and go to the nearest emergency room.  Reduce your risk of vascular disease:  Stop smoking. If you would like help call [facility] at 1-800-QUIT-NOW 4153816972) or [place] at [phone]. Manage your cholesterol Maintain a desired weight Control your diabetes Keep your blood pressure down  If you have any questions, please call the office at [phone].

## 2021-02-08 NOTE — Discharge Summary (Signed)
Discharge Summary     Garrett Hunter May 28, 1946 75 y.o. male  [id number]  Admission Date: 02/07/2021  Discharge Date: 02/08/2021  Physician: [name], MD  Admission Diagnosis: Carotid stenosis [I65.29]   HPI:   This is a 75 y.o. male who presented to the emergency department on 01/27/2021 with chest pain.  He has a history of 3 prior MIs.  When he arrived he was in wide-complex tachycardia.  He was started on amiodarone.  He underwent urgent cardioversion with restoration of sinus rhythm.  This procedure was complicated by transient respiratory suppression.  He had elevated troponins and underwent cardiac catheterization on 01/28/2021.  On 01/30/2021, he was found to be aphasic with a right facial droop.  CT angio showed left MCA M2 occlusion.  He was given tPA.  He underwent attempted mechanical thrombectomy by neuroradiology which was unsuccessful.  Further stroke work-up with CT angiogram showed a 75% stenosis of the origin of the left ICA.  Vascular surgery consultation was requested.  The patient is still having difficulty with expressive aphasia and some difficulty with swallowing.  He does not endorse amaurosis fugax or motor or sensory dysfunction in either the upper or lower extremities.[name]  He is now on aspirin and Plavix as well as statin therapy.  The patient has a history of abdominal aortic aneurysm repaired at [facility].  He is also undergone lower extremity stenting for PAD.  He is medically managed for hypertension.  He is a former smoker.  Hospital Course:  The patient was admitted to the hospital and taken to the operating room on 02/07/2021 and underwent Left TCAR for symptomatic carotid artery stenosis.    Findings: 65% stenosis, resolved after stenting Stent:ENROUTE 9x40 Predilation balloon: 6 x 30 Procedure time: 45 minutes Contrast: 18 cc Dose area: 119.2 Fluoroscopy time: 2.5 minutes  The pt tolerated the procedure well and was transported to the  PACU in good condition.   By POD 1, the pt still has expressive aphasia but is moving all extremities equally, his tongue is midline, he is able to void and swallow.   TOC consulted, face to face completed and HH speech therapy resumed.    Recent Labs    02/07/21 0756 02/08/21 0500  NA 135 136  K 3.4* 3.6  CL 100 99  CO2 26 27  GLUCOSE 126* 138*  BUN 33* 23  CALCIUM 9.4 9.6   Recent Labs    02/07/21 0756 02/08/21 0500  WBC 8.7 14.5*  HGB 13.0 11.6*  HCT 38.8* 34.2*  PLT 287 281   Recent Labs    02/07/21 0756  INR 1.0     Discharge Instructions    Discharge patient   Complete by: As directed    Discharge home once dr fields has seen pt and HH needs arranged.  I have spoken to [facility].   Discharge disposition: 01-Home or Self Care   Discharge patient date: 02/08/2021     Discharge Diagnosis:  Carotid stenosis [I65.29]  Secondary Diagnosis: Patient Active Problem List   Diagnosis Date Noted  . Carotid stenosis 02/07/2021  . Cerebral thrombosis with cerebral infarction 01/30/2021  . Cerebral embolism with cerebral infarction 01/30/2021  . Sustained ventricular tachycardia ([place]) 01/28/2021  . AKI (acute kidney injury) ([place])   . Sustained VT (ventricular tachycardia) ([place]) 01/27/2021  . Peptic ulcer disease   . Duodenal ulcer disease   . GIB (gastrointestinal bleeding) 08/02/2018  . Diabetes mellitus without complication ([place]) 35/36/1443  . Erectile dysfunction  07/01/2015  . Primary hypertension 08/09/2014  . Pure hypercholesterolemia 08/09/2014  . Chronic systolic heart failure ([place]) 08/04/2012  . Coronary artery disease   . Hyperlipidemia   . AAA (abdominal aortic aneurysm) ([place])   . NSTEMI (non-ST elevated myocardial infarction) ([place]) 07/09/2012   Past Medical History:  Diagnosis Date  . AAA (abdominal aortic aneurysm) (HCC)    thoracoabdominal aortic aneursym, s/p right ilio-femoral bypass 10/12/16; 3V FEVAR with SMA, RRA, and LRA stenting 11/16/16 by Dr.  Sammuel Hines [facility])  . Benign neoplasm of colon    patient not sure  . CHF (congestive heart failure) (HCC)    EF 45-50%  . Chronic airway obstruction, not elsewhere classified    pt denies  . Coronary artery disease    Inferior MI in 1996 treated with TPA. Cardiac cath in 2011 at [facility] showed chronic occlusion of the proximal RCA and proximal left circumflex without obstructive disease in the LAD. Non-ST elevation myocardial infarction in October 2013 while on vacation in [place]. Cardiac catheterization showed plaque rupture in the proximal ramus with thrombus. He had balloon angioplasty done. 01/28/21 cath   . Coronary atherosclerosis of native coronary artery   . Hepatitis A    teenager, no longer a problem  . Hyperlipidemia    Intolerance to statins.  . Hypertension   . MI (myocardial infarction) ([place])    x 3 - last one 2013  . Renal artery stenosis (HCC)    s/p bilateral renal artery angioplasty 07/26/18  . Stroke ([place]) 01/2021  . Ventricular tachycardia ([place])    s/p cardioverson 01/27/21    Allergies as of 02/08/2021      Reactions   Crestor [rosuvastatin]    Cramps. (Hight dose of Crestor)   Livalo [pitavastatin]    myalgia   Pravachol [pravastatin Sodium]    Cramps   Zocor [simvastatin]    Cramps      Medication List    TAKE these medications   amiodarone 400 MG tablet Commonly known as: PACERONE Take 1 tablet (400 mg total) by mouth 2 (two) times daily for 10 days.   amiodarone 400 MG tablet Commonly known as: PACERONE Take 1 tablet (400 mg total) by mouth daily for 14 days. Start taking on: Feb 12, 2021   amiodarone 200 MG tablet Commonly known as: PACERONE Take 1 tablet (200 mg total) by mouth daily. Start taking on: Feb 26, 2021   aspirin EC 81 MG tablet Take 81 mg by mouth daily. Swallow whole.   calcium-vitamin D 500-200 MG-UNIT tablet Commonly known as: OSCAL WITH D Take 1 tablet by mouth daily with breakfast.   carvedilol 12.5 MG tablet Commonly known  as: COREG Take 12.5 mg by mouth 2 (two) times daily with a meal.   clopidogrel 75 MG tablet Commonly known as: PLAVIX Take 1 tablet (75 mg total) by mouth daily.   COQ10 PO Take by mouth.   hydrochlorothiazide 25 MG tablet Commonly known as: HYDRODIURIL Take 1 tablet (25 mg total) by mouth daily.   lisinopril 20 MG tablet Commonly known as: ZESTRIL Take 2 tablets (40 mg total) by mouth daily.   multivitamin with minerals Tabs tablet Take 1 tablet by mouth daily.   nystatin 100000 UNIT/ML suspension Commonly known as: MYCOSTATIN Use as directed 5 mLs (500,000 Units total) in the mouth or throat 4 (four) times daily. Swish and spit   oxyCODONE-acetaminophen 5-325 MG tablet Commonly known as: Percocet Take 1 tablet by mouth every 6 (six) hours as needed  for severe pain.   rosuvastatin 5 MG tablet Commonly known as: CRESTOR Take 5 mg by mouth daily.   vitamin C 500 MG tablet Commonly known as: ASCORBIC ACID Take 500 mg by mouth daily.        [facility] Discharge Instructions Carotid Endarterectomy (CEA)  Please refer to the following instructions for your post-procedure care. Your surgeon or physician assistant will discuss any changes with you.  Activity  You are encouraged to walk as much as you can. You can slowly return to normal activities but must avoid strenuous activity and heavy lifting until your doctor tell you it's OK. Avoid activities such as vacuuming or swinging a golf club. You can drive after one week if you are comfortable and you are no longer taking prescription pain medications. It is normal to feel tired for serval weeks after your surgery. It is also normal to have difficulty with sleep habits, eating, and bowel movements after surgery. These will go away with time.  Bathing/Showering  You may shower after you come home. Do not soak in a bathtub, hot tub, or swim until the incision heals completely.  Incision  Care  Shower every day. Clean your incision with mild soap and water. Pat the area dry with a clean towel. You do not need a bandage unless otherwise instructed. Do not apply any ointments or creams to your incision. You may have skin glue on your incision. Do not peel it off. It will come off on its own in about one week. Your incision may feel thickened and raised for several weeks after your surgery. This is normal and the skin will soften over time. For Men Only: It's OK to shave around the incision but do not shave the incision itself for 2 weeks. It is common to have numbness under your chin that could last for several months.  Diet  Resume your normal diet. There are no special food restrictions following this procedure. A low fat/low cholesterol diet is recommended for all patients with vascular disease. In order to heal from your surgery, it is CRITICAL to get adequate nutrition. Your body requires vitamins, minerals, and protein. Vegetables are the best source of vitamins and minerals. Vegetables also provide the perfect balance of protein. Processed food has little nutritional value, so try to avoid this.  Medications  Resume taking all of your medications unless your doctor or physician assistant tells you not to.  If your incision is causing pain, you may take over-the- counter pain relievers such as acetaminophen (Tylenol). If you were prescribed a stronger pain medication, please be aware these medications can cause nausea and constipation.  Prevent nausea by taking the medication with a snack or meal. Avoid constipation by drinking plenty of fluids and eating foods with a high amount of fiber, such as fruits, vegetables, and grains.  Do not take Tylenol if you are taking prescription pain medications.  Follow Up  Our office will schedule a follow up appointment 2-3 weeks following discharge.  Please call us immediately for any of the following conditions  . Increased pain, redness,  drainage (pus) from your incision site. . Fever of 101 degrees or higher. . If you should develop stroke (slurred speech, difficulty swallowing, weakness on one side of your body, loss of vision) you should call 911 and go to the nearest emergency room. .  Reduce your risk of vascular disease:  . Stop smoking. If you would like help call [facility]  at 1-800-QUIT-NOW 361-412-9021) or [place] at [phone]. . Manage your cholesterol . Maintain a desired weight . Control your diabetes . Keep your blood pressure down .  If you have any questions, please call the office at [phone].  Prescriptions given: 1.   Roxicet #8 No Refill 2.  Nystatin swish and swallow  Disposition: home  Patient's condition: is Good  Follow up: 1. VVS in 4 weeks with duplex   Leontine Locket, PA-C [facility] [phone]   --- For [facility] use ---   Modified Rankin score at D/C (0-6): 2  IV medication needed for:  1. Hypertension: No 2. Hypotension: No  Post-op Complications: No  1. Post-op CVA or TIA: No  If yes: Event classification (right eye, left eye, right cortical, left cortical, verterobasilar, other): n/a  If yes: Timing of event (intra-op, <6 hrs post-op, >=6 hrs post-op, unknown): n/a  2. CN injury: No  If yes: CN n/a injuried   3. Myocardial infarction: No  If yes: Dx by (EKG or clinical, Troponin): n/a  4.  CHF: No  5.  Dysrhythmia (new): No  6. Wound infection: No  7. Reperfusion symptoms: No  8. Return to OR: No  If yes: return to OR for (bleeding, neurologic, other CEA incision, other): n/a  Discharge medications: Statin use:  Yes ASA use:  Yes   Beta blocker use:  Yes ACE-Inhibitor use:  Yes  ARB use:  No CCB use: No P2Y12 Antagonist use: Yes, [x ] Plavix, [ ]  Plasugrel, [ ]  Ticlopinine, [ ]  Ticagrelor, [ ]  Other, [ ]  No for medical reason, [ ]  Non-compliant, [ ]  Not-indicated Anti-coagulant use:  No, [ ]  Warfarin, [ ]   Rivaroxaban, [ ]  Dabigatran,

## 2021-02-08 NOTE — TOC Transition Note (Signed)
Transition of Care [facility]) - CM/SW Discharge Note   Patient Details  Name: Garrett Hunter MRN: [id number] Date of Birth: 11-03-1945  [facility] CM/SW Contact:  Maebelle Munroe, RN Phone Number: 02/08/2021, 1:50 PM   Clinical Narrative:  [facility] team for discharge planning. Spoke to spouse- [name] regarding discharge plan. She requests [place] speech therapy. She voices he is currently going outpatient. Explained to spouse that if there is no other skilled need in the home the speech therapist is not a covered benefit. She expresses understanding and will keep the current scheduled appointment for 02/25/2021. Call to [place] to notify her that patient will continue outpatient speech services.     Final next level of care: Home/Self Care Barriers to Discharge: No Barriers Identified   Patient Goals and CMS Choice        Discharge Placement                       Discharge Plan and Services                                     Social Determinants of Health (SDOH) Interventions     Readmission Risk Interventions No flowsheet data found.

## 2021-02-10 ENCOUNTER — Telehealth: Payer: Self-pay | Admitting: Cardiovascular Disease

## 2021-02-10 ENCOUNTER — Telehealth: Payer: Self-pay

## 2021-02-10 ENCOUNTER — Encounter (HOSPITAL_COMMUNITY): Payer: Self-pay | Admitting: Surgery

## 2021-02-10 NOTE — Telephone Encounter (Signed)
Spoke with patients wife per release form. She states they are going out of town to [employer] and he wanted to wait until he returns to wear monitor. So they want to wait until 5/22 or 5/23 to place monitor. Advised that I would update provider and to call us if any further questions. She verbalized understanding with no further questions at this time.

## 2021-02-10 NOTE — Telephone Encounter (Signed)
Spoke with patient and he requested that I call his wife.  

## 2021-02-10 NOTE — Telephone Encounter (Signed)
  1. Is this related to a heart monitor you are wearing?  (If the patient says no, please ask     if they are caling about ICD/pacemaker.)  YES  2. What is your issue??  Patient dc from hospital with orders to have a monitor .  Per wife patient is going to the beach and wants to start wearing on 5/22 when he returns.  Patient has received monitor in the mail.  Please call to advise if ok to wait to apply.        (If the patient is calling for results of the heart monitor this     message should be sent to nurse.)   Please route to covering RN/CMA/RMA for results. Route to monitor technicians or your monitor tech representative for your site for any technical concerns

## 2021-02-10 NOTE — Telephone Encounter (Signed)
Gave verbal permission to Davy Pique from [facility] to get speech therapy for patient to help with aphasia.

## 2021-02-11 ENCOUNTER — Other Ambulatory Visit: Payer: Self-pay

## 2021-02-11 DIAGNOSIS — I6529 Occlusion and stenosis of unspecified carotid artery: Secondary | ICD-10-CM

## 2021-02-12 NOTE — Telephone Encounter (Signed)
Ok that's fine

## 2021-02-12 NOTE — Telephone Encounter (Signed)
Spoke with patients wife per release form and reviewed that provider was fine with him holding monitor placement until he returns home. She was appreciative for the call with no further questions at this time.

## 2021-02-13 ENCOUNTER — Encounter: Payer: Medicare Other | Admitting: Speech Pathology

## 2021-02-19 ENCOUNTER — Ambulatory Visit: Payer: Medicare Other | Admitting: Cardiology

## 2021-02-23 ENCOUNTER — Other Ambulatory Visit: Payer: Self-pay | Admitting: Cardiovascular Disease

## 2021-02-25 ENCOUNTER — Ambulatory Visit: Payer: Medicare Other | Admitting: Speech Pathology

## 2021-02-26 ENCOUNTER — Ambulatory Visit (INDEPENDENT_AMBULATORY_CARE_PROVIDER_SITE_OTHER): Payer: Medicare Other

## 2021-02-26 ENCOUNTER — Encounter: Payer: Self-pay | Admitting: Cardiology

## 2021-02-26 DIAGNOSIS — R002 Palpitations: Secondary | ICD-10-CM

## 2021-02-26 DIAGNOSIS — I472 Ventricular tachycardia, unspecified: Secondary | ICD-10-CM

## 2021-02-26 DIAGNOSIS — I6330 Cerebral infarction due to thrombosis of unspecified cerebral artery: Secondary | ICD-10-CM | POA: Diagnosis not present

## 2021-02-28 ENCOUNTER — Ambulatory Visit: Payer: Medicare Other | Admitting: Speech Pathology

## 2021-03-10 ENCOUNTER — Ambulatory Visit (INDEPENDENT_AMBULATORY_CARE_PROVIDER_SITE_OTHER): Payer: Medicare Other | Admitting: Physician Assistant

## 2021-03-10 ENCOUNTER — Other Ambulatory Visit: Payer: Self-pay

## 2021-03-10 ENCOUNTER — Ambulatory Visit (HOSPITAL_COMMUNITY)
Admission: RE | Admit: 2021-03-10 | Discharge: 2021-03-10 | Disposition: A | Discharge status: Home / Self Care | Payer: Medicare Other | Source: Ambulatory Visit | Attending: Surgery | Admitting: Surgery

## 2021-03-10 VITALS — BP 127/68 | HR 57 | Temp 97.4°F | Resp 18 | Ht 71.0 in | Wt 209.0 lb

## 2021-03-10 DIAGNOSIS — I6529 Occlusion and stenosis of unspecified carotid artery: Secondary | ICD-10-CM

## 2021-03-10 NOTE — Progress Notes (Signed)
Carotid Artery Follow-Up   VASCULAR SURGERY ASSESSMENT & PLAN:   Garrett Hunter is a 75 y.o. male who is status post left carotid stent for symptomatic left carotid stenosis by Dr. Trula Slade on Feb 07, 2021.  He presented with left brain stroke exhibited by expressive aphasia and dysphagia.    Bilateral carotid artery stenosis: The patient has no acute symptoms referable to carotid artery stenosis.  Duplex examination today reveals patent stent and right internal carotid artery gnosis estimated at 1 to 39%.  We reviewed the signs and symptoms of stroke/TIA and advised the patient to call EMS should these occur.   Continue optimal medical management of diabetes, hypertension and follow-up with primary care physician. Encouraged continuance of complete smoking cessation. Continue the following medications: Aspirin Plavix and statin Follow-up in 9 months with carotid duplex ultrasound.  SUBJECTIVE:   His wife accompanies him today.  He has residual expressive aphasia and mild dysphagia and has continued with speech therapy.  His wife notes improvement in the symptoms.  He denies monocular blindness, extremity weakness or numbness.  PHYSICAL EXAM:   Vitals:   03/10/21 1510 03/10/21 1514  BP: 139/73 127/68  Pulse: (!) 59 (!) 57  Resp: 18   Temp: (!) 97.4 F (36.3 C)   TempSrc: Temporal   SpO2: 100%   Weight: 209 lb (94.8 kg)   Height: 5\' 11"  (1.803 m)     General appearance: Well-developed, well-nourished in no apparent distress Neurologic: Alert and oriented x4, tongue is midline, face symmetric,5 out of 5 bilateral upper extremity grip strength, triceps and biceps strength. + Expressive aphasia Cardiovascular: Heart rate and rhythm are regular.  No carotid bruits. Respirations: Nonlabored Extremities: Both feet are warm with intact sensation and motor function.  He has 2+ brachial and radial pulses.  NON-INVASIVE VASCULAR STUDIES     PROBLEM LIST:    The patient's past  medical history, past surgical history, family history, social history, allergy list and medication list are reviewed.   CURRENT MEDS:    Current Outpatient Medications:  .  amiodarone (PACERONE) 200 MG tablet, Take 1 tablet (200 mg total) by mouth daily., Disp: 30 tablet, Rfl: 0 .  aspirin EC 81 MG tablet, Take 81 mg by mouth daily. Swallow whole., Disp: , Rfl:  .  calcium-vitamin D (OSCAL WITH D) 500-200 MG-UNIT tablet, Take 1 tablet by mouth daily with breakfast., Disp: , Rfl:  .  carvedilol (COREG) 12.5 MG tablet, TAKE 1 TABLET TWICE A DAY, Disp: 180 tablet, Rfl: 1 .  clopidogrel (PLAVIX) 75 MG tablet, Take 1 tablet (75 mg total) by mouth daily., Disp: 30 tablet, Rfl: 2 .  Coenzyme Q10 (COQ10 PO), Take by mouth., Disp: , Rfl:  .  hydrochlorothiazide (HYDRODIURIL) 25 MG tablet, Take 1 tablet (25 mg total) by mouth daily., Disp: 30 tablet, Rfl: 1 .  lisinopril (ZESTRIL) 20 MG tablet, Take 2 tablets (40 mg total) by mouth daily., Disp: 30 tablet, Rfl: 1 .  Multiple Vitamin (MULTIVITAMIN WITH MINERALS) TABS tablet, Take 1 tablet by mouth daily., Disp: , Rfl:  .  nystatin (MYCOSTATIN) 100000 UNIT/ML suspension, Use as directed 5 mLs (500,000 Units total) in the mouth or throat 4 (four) times daily. Swish and spit, Disp: 60 mL, Rfl: 0 .  rosuvastatin (CRESTOR) 5 MG tablet, TAKE 1 TABLET DAILY, Disp: 90 tablet, Rfl: 1 .  vitamin C (ASCORBIC ACID) 500 MG tablet, Take 500 mg by mouth daily., Disp: , Rfl:  .  amiodarone (PACERONE) 400  MG tablet, Take 1 tablet (400 mg total) by mouth daily for 14 days., Disp: 14 tablet, Rfl: 0 .  amiodarone (PACERONE) 400 MG tablet, Take 1 tablet (400 mg total) by mouth 2 (two) times daily for 10 days., Disp: 20 tablet, Rfl: 0 .  oxyCODONE-acetaminophen (PERCOCET) 5-325 MG tablet, Take 1 tablet by mouth every 6 (six) hours as needed for severe pain., Disp: 8 tablet, Rfl: 0   REVIEW OF SYSTEMS:   [X]  denotes positive finding, [ ]  denotes negative finding Cardiac   Comments:  Chest pain or chest pressure:    Shortness of breath upon exertion:    Short of breath when lying flat:    Irregular heart rhythm:        Vascular    Pain in calf, thigh, or hip brought on by ambulation:    Pain in feet at night that wakes you up from your sleep:     Blood clot in your veins:    Leg swelling:         Pulmonary    Oxygen at home:    Productive cough:     Wheezing:         Neurologic    Sudden weakness in arms or legs:     Sudden numbness in arms or legs:     Sudden onset of difficulty speaking or slurred speech: x  see HPI  Temporary loss of vision in one eye:     Problems with dizziness:         Gastrointestinal    Blood in stool:     Vomited blood:         Genitourinary    Burning when urinating:     Blood in urine:        Psychiatric    Major depression:         Hematologic    Bleeding problems:    Problems with blood clotting too easily:        Skin    Rashes or ulcers:        Constitutional    Fever or chills:     Barbie Banner, PA-C  Office: [phone] 03/10/2021   Clinic MD: Dr. Stanford Breed on call

## 2021-03-11 ENCOUNTER — Encounter: Payer: Medicare Other | Admitting: Speech Pathology

## 2021-03-11 ENCOUNTER — Other Ambulatory Visit: Payer: Self-pay

## 2021-03-11 DIAGNOSIS — I6529 Occlusion and stenosis of unspecified carotid artery: Secondary | ICD-10-CM

## 2021-03-12 ENCOUNTER — Ambulatory Visit: Payer: Medicare Other | Admitting: Cardiology

## 2021-03-13 ENCOUNTER — Encounter: Payer: Medicare Other | Admitting: Speech Pathology

## 2021-03-15 ENCOUNTER — Telehealth: Payer: Self-pay | Admitting: Internal Medicine

## 2021-03-16 ENCOUNTER — Inpatient Hospital Stay
Admission: EM | Admit: 2021-03-16 | Discharge: 2021-03-20 | DRG: 378 | Disposition: A | Discharge status: Home / Self Care | Payer: Medicare Other | Attending: Internal Medicine | Admitting: Internal Medicine

## 2021-03-16 ENCOUNTER — Other Ambulatory Visit: Payer: Self-pay

## 2021-03-16 DIAGNOSIS — K921 Melena: Secondary | ICD-10-CM | POA: Diagnosis not present

## 2021-03-16 DIAGNOSIS — E876 Hypokalemia: Secondary | ICD-10-CM | POA: Diagnosis present

## 2021-03-16 DIAGNOSIS — E1122 Type 2 diabetes mellitus with diabetic chronic kidney disease: Secondary | ICD-10-CM | POA: Diagnosis present

## 2021-03-16 DIAGNOSIS — Z87891 Personal history of nicotine dependence: Secondary | ICD-10-CM

## 2021-03-16 DIAGNOSIS — N529 Male erectile dysfunction, unspecified: Secondary | ICD-10-CM | POA: Diagnosis present

## 2021-03-16 DIAGNOSIS — K319 Disease of stomach and duodenum, unspecified: Secondary | ICD-10-CM | POA: Diagnosis present

## 2021-03-16 DIAGNOSIS — I6522 Occlusion and stenosis of left carotid artery: Secondary | ICD-10-CM | POA: Diagnosis present

## 2021-03-16 DIAGNOSIS — R42 Dizziness and giddiness: Secondary | ICD-10-CM | POA: Diagnosis not present

## 2021-03-16 DIAGNOSIS — I472 Ventricular tachycardia, unspecified: Secondary | ICD-10-CM

## 2021-03-16 DIAGNOSIS — I5022 Chronic systolic (congestive) heart failure: Secondary | ICD-10-CM | POA: Diagnosis present

## 2021-03-16 DIAGNOSIS — I2510 Atherosclerotic heart disease of native coronary artery without angina pectoris: Secondary | ICD-10-CM | POA: Diagnosis present

## 2021-03-16 DIAGNOSIS — K279 Peptic ulcer, site unspecified, unspecified as acute or chronic, without hemorrhage or perforation: Secondary | ICD-10-CM | POA: Diagnosis present

## 2021-03-16 DIAGNOSIS — K269 Duodenal ulcer, unspecified as acute or chronic, without hemorrhage or perforation: Secondary | ICD-10-CM | POA: Diagnosis present

## 2021-03-16 DIAGNOSIS — D62 Acute posthemorrhagic anemia: Secondary | ICD-10-CM | POA: Diagnosis present

## 2021-03-16 DIAGNOSIS — K5730 Diverticulosis of large intestine without perforation or abscess without bleeding: Secondary | ICD-10-CM | POA: Diagnosis present

## 2021-03-16 DIAGNOSIS — Z28310 Unvaccinated for covid-19: Secondary | ICD-10-CM

## 2021-03-16 DIAGNOSIS — N183 Chronic kidney disease, stage 3 unspecified: Secondary | ICD-10-CM

## 2021-03-16 DIAGNOSIS — I4729 Other ventricular tachycardia: Secondary | ICD-10-CM

## 2021-03-16 DIAGNOSIS — N179 Acute kidney failure, unspecified: Secondary | ICD-10-CM | POA: Diagnosis present

## 2021-03-16 DIAGNOSIS — K644 Residual hemorrhoidal skin tags: Secondary | ICD-10-CM | POA: Diagnosis present

## 2021-03-16 DIAGNOSIS — Z79899 Other long term (current) drug therapy: Secondary | ICD-10-CM

## 2021-03-16 DIAGNOSIS — K635 Polyp of colon: Secondary | ICD-10-CM | POA: Diagnosis present

## 2021-03-16 DIAGNOSIS — I6529 Occlusion and stenosis of unspecified carotid artery: Secondary | ICD-10-CM | POA: Diagnosis present

## 2021-03-16 DIAGNOSIS — I5042 Chronic combined systolic (congestive) and diastolic (congestive) heart failure: Secondary | ICD-10-CM | POA: Diagnosis present

## 2021-03-16 DIAGNOSIS — I714 Abdominal aortic aneurysm, without rupture, unspecified: Secondary | ICD-10-CM | POA: Diagnosis present

## 2021-03-16 DIAGNOSIS — I252 Old myocardial infarction: Secondary | ICD-10-CM

## 2021-03-16 DIAGNOSIS — K922 Gastrointestinal hemorrhage, unspecified: Secondary | ICD-10-CM | POA: Diagnosis not present

## 2021-03-16 DIAGNOSIS — Z955 Presence of coronary angioplasty implant and graft: Secondary | ICD-10-CM

## 2021-03-16 DIAGNOSIS — E7800 Pure hypercholesterolemia, unspecified: Secondary | ICD-10-CM | POA: Diagnosis present

## 2021-03-16 DIAGNOSIS — I10 Essential (primary) hypertension: Secondary | ICD-10-CM | POA: Diagnosis present

## 2021-03-16 DIAGNOSIS — Z8616 Personal history of COVID-19: Secondary | ICD-10-CM

## 2021-03-16 DIAGNOSIS — I130 Hypertensive heart and chronic kidney disease with heart failure and stage 1 through stage 4 chronic kidney disease, or unspecified chronic kidney disease: Secondary | ICD-10-CM | POA: Diagnosis present

## 2021-03-16 DIAGNOSIS — E785 Hyperlipidemia, unspecified: Secondary | ICD-10-CM | POA: Diagnosis present

## 2021-03-16 DIAGNOSIS — I69320 Aphasia following cerebral infarction: Secondary | ICD-10-CM

## 2021-03-16 DIAGNOSIS — Z7982 Long term (current) use of aspirin: Secondary | ICD-10-CM

## 2021-03-16 DIAGNOSIS — Z7902 Long term (current) use of antithrombotics/antiplatelets: Secondary | ICD-10-CM

## 2021-03-16 DIAGNOSIS — K257 Chronic gastric ulcer without hemorrhage or perforation: Secondary | ICD-10-CM | POA: Diagnosis present

## 2021-03-16 LAB — CBC WITH DIFFERENTIAL/PLATELET
Abs Immature Granulocytes: 0.04 K/uL (ref 0.00–0.07)
Basophils Absolute: 0 K/uL (ref 0.0–0.1)
Basophils Relative: 0 %
Eosinophils Absolute: 0.1 K/uL (ref 0.0–0.5)
Eosinophils Relative: 1 %
HCT: 27.7 % — ABNORMAL LOW (ref 39.0–52.0)
Hemoglobin: 9.4 g/dL — ABNORMAL LOW (ref 13.0–17.0)
Immature Granulocytes: 1 %
Lymphocytes Relative: 18 %
Lymphs Abs: 1.2 K/uL (ref 0.7–4.0)
MCH: 33.5 pg (ref 26.0–34.0)
MCHC: 33.9 g/dL (ref 30.0–36.0)
MCV: 98.6 fL (ref 80.0–100.0)
Monocytes Absolute: 0.4 K/uL (ref 0.1–1.0)
Monocytes Relative: 6 %
Neutro Abs: 5.1 K/uL (ref 1.7–7.7)
Neutrophils Relative %: 74 %
Platelets: 242 K/uL (ref 150–400)
RBC: 2.81 MIL/uL — ABNORMAL LOW (ref 4.22–5.81)
RDW: 14.5 % (ref 11.5–15.5)
WBC: 6.9 K/uL (ref 4.0–10.5)
nRBC: 0 % (ref 0.0–0.2)

## 2021-03-16 LAB — TYPE AND SCREEN
ABO/RH(D): O NEG
Antibody Screen: NEGATIVE

## 2021-03-16 LAB — COMPREHENSIVE METABOLIC PANEL
ALT: 16 U/L (ref 0–44)
AST: 23 U/L (ref 15–41)
Albumin: 4 g/dL (ref 3.5–5.0)
Alkaline Phosphatase: 76 U/L (ref 38–126)
Anion gap: 7 (ref 5–15)
BUN: 40 mg/dL — ABNORMAL HIGH (ref 8–23)
CO2: 27 mmol/L (ref 22–32)
Calcium: 9.1 mg/dL (ref 8.9–10.3)
Chloride: 104 mmol/L (ref 98–111)
Creatinine, Ser: 1.46 mg/dL — ABNORMAL HIGH (ref 0.61–1.24)
GFR, Estimated: 51 mL/min — ABNORMAL LOW (ref >60)
Glucose, Bld: 149 mg/dL — ABNORMAL HIGH (ref 70–99)
Potassium: 3.3 mmol/L — ABNORMAL LOW (ref 3.5–5.1)
Sodium: 138 mmol/L (ref 135–145)
Total Bilirubin: 0.6 mg/dL (ref 0.3–1.2)
Total Protein: 6.3 g/dL — ABNORMAL LOW (ref 6.5–8.1)

## 2021-03-16 LAB — RESP PANEL BY RT-PCR (FLU A&B, COVID) ARPGX2
Influenza A by PCR: NEGATIVE
Influenza B by PCR: NEGATIVE
SARS Coronavirus 2 by RT PCR: NEGATIVE

## 2021-03-16 LAB — APTT: aPTT: 27 seconds (ref 24–36)

## 2021-03-16 LAB — PROTIME-INR
INR: 1 (ref 0.8–1.2)
Prothrombin Time: 13.6 seconds (ref 11.4–15.2)

## 2021-03-16 LAB — LIPASE, BLOOD: Lipase: 25 U/L (ref 11–51)

## 2021-03-16 LAB — MAGNESIUM: Magnesium: 2.1 mg/dL (ref 1.7–2.4)

## 2021-03-16 MED ORDER — CARVEDILOL 12.5 MG PO TABS
12.5000 mg | ORAL_TABLET | Freq: Two times a day (BID) | ORAL | Status: DC
  Administered 2021-03-16 – 2021-03-20 (×8): 12.5 mg via ORAL
  Filled 2021-03-16 (×8): qty 1

## 2021-03-16 MED ORDER — NYSTATIN 100000 UNIT/ML MT SUSP
5.0000 mL | Freq: Four times a day (QID) | OROMUCOSAL | Status: DC
  Administered 2021-03-16 (×3): 500000 [IU] via OROMUCOSAL
  Filled 2021-03-16 (×7): qty 5

## 2021-03-16 MED ORDER — SODIUM CHLORIDE 0.9 % IV BOLUS
1000.0000 mL | Freq: Once | INTRAVENOUS | Status: AC
  Administered 2021-03-16: 1000 mL via INTRAVENOUS

## 2021-03-16 MED ORDER — POTASSIUM CHLORIDE CRYS ER 20 MEQ PO TBCR
40.0000 meq | EXTENDED_RELEASE_TABLET | Freq: Once | ORAL | Status: AC
  Administered 2021-03-16: 40 meq via ORAL
  Filled 2021-03-16: qty 2

## 2021-03-16 MED ORDER — AMIODARONE HCL 200 MG PO TABS
200.0000 mg | ORAL_TABLET | Freq: Every day | ORAL | Status: DC
  Administered 2021-03-16 – 2021-03-19 (×4): 200 mg via ORAL
  Filled 2021-03-16 (×5): qty 1

## 2021-03-16 MED ORDER — PANTOPRAZOLE SODIUM 40 MG IV SOLR
40.0000 mg | Freq: Two times a day (BID) | INTRAVENOUS | Status: DC
  Administered 2021-03-19 – 2021-03-20 (×2): 40 mg via INTRAVENOUS
  Filled 2021-03-16 (×2): qty 40

## 2021-03-16 MED ORDER — ROSUVASTATIN CALCIUM 10 MG PO TABS
5.0000 mg | ORAL_TABLET | Freq: Every day | ORAL | Status: DC
  Administered 2021-03-16 – 2021-03-19 (×4): 5 mg via ORAL
  Filled 2021-03-16 (×5): qty 1

## 2021-03-16 MED ORDER — ONDANSETRON HCL 4 MG PO TABS: 4.0000 mg | ORAL_TABLET | Freq: Four times a day (QID) | ORAL | Status: DC | PRN

## 2021-03-16 MED ORDER — ACETAMINOPHEN 325 MG PO TABS: 650.0000 mg | ORAL_TABLET | Freq: Four times a day (QID) | ORAL | Status: AC | PRN

## 2021-03-16 MED ORDER — ACETAMINOPHEN 650 MG RE SUPP: 650.0000 mg | Freq: Four times a day (QID) | RECTAL | Status: AC | PRN

## 2021-03-16 MED ORDER — ONDANSETRON HCL 4 MG/2ML IJ SOLN: 4.0000 mg | Freq: Four times a day (QID) | INTRAMUSCULAR | Status: DC | PRN

## 2021-03-16 MED ORDER — LACTATED RINGERS IV SOLN: INTRAVENOUS | Status: AC

## 2021-03-16 MED ORDER — SODIUM CHLORIDE 0.9 % IV SOLN
80.0000 mg | Freq: Once | INTRAVENOUS | Status: AC
  Administered 2021-03-16: 80 mg via INTRAVENOUS
  Filled 2021-03-16: qty 80

## 2021-03-16 MED ORDER — SODIUM CHLORIDE 0.9 % IV SOLN
8.0000 mg/h | INTRAVENOUS | Status: AC
  Administered 2021-03-16 – 2021-03-19 (×6): 8 mg/h via INTRAVENOUS
  Filled 2021-03-16 (×7): qty 80

## 2021-03-16 NOTE — Consult Note (Signed)
Lucilla Lame, MD [facility]  [address] [place],  [place] [address] Phone: [phone] Fax : [phone]  Consultation  Referring Provider:     No ref. provider found Primary Care Physician:  Derinda Late, MD Primary Gastroenterologist:  Dr. Marius Ditch          Reason for Consultation:     GI bleed  Date of Admission:  03/16/2021 Date of Consultation:  03/16/2021         HPI:   Garrett Hunter is a 75 y.o. male who has a history of having been put on Plavix and aspirin for a stroke and has had 3 days of black stools.  The patient came in with hypotension that quickly corrected with IV fluids. The patient has a history of peptic ulcer disease with an ulcer near the ampulla that was biopsied and only showed inflammation. The patient recently had a thrombectomy and received TPA with a left carotid stenosis status post stent placement.  It was reported that the patient had been told by neurologist on Plavix and aspirin for 3 months.  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 The patient denies any abdominal pain fevers chills nausea or vomiting.  He also reports that he took Plavix the morning of coming in to the ER today. He reported that he had been feeling more weak today and his skin was more [place].  His wife states that his hands were pale and cold.  This is what made them decided to come to the emergency room.  Past Medical History:  Diagnosis Date   AAA (abdominal aortic aneurysm) (HCC)    thoracoabdominal aortic aneursym, s/p right ilio-femoral bypass 10/12/16; 3V FEVAR with SMA, RRA, and LRA stenting 11/16/16 by Dr. Sammuel Hines [facility])   Benign neoplasm of colon    patient not sure   CHF (congestive heart failure) (HCC)    EF 45-50%   Chronic airway obstruction, not elsewhere classified    pt denies   Coronary artery disease    Inferior MI in 1996 treated with TPA. Cardiac cath in 2011 at [facility] showed chronic occlusion of the proximal RCA and proximal left circumflex  without obstructive disease in the LAD. Non-ST elevation myocardial infarction in October 2013 while on vacation in [place]. Cardiac catheterization showed plaque rupture in the proximal ramus with thrombus. He had balloon angioplasty done. 01/28/21 cath    Coronary atherosclerosis of native coronary artery    Hepatitis A    teenager, no longer a problem   Hyperlipidemia    Intolerance to statins.   Hypertension    MI (myocardial infarction) ([place])    x 3 - last one 2013   Renal artery stenosis (HCC)    s/p bilateral renal artery angioplasty 07/26/18   Stroke ([place]) 01/2021   Ventricular tachycardia ([place])    s/p cardioverson 01/27/21    Past Surgical History:  Procedure Laterality Date   ABDOMINAL AORTIC ANEURYSM REPAIR     11/16/2016   ANGIOPLASTY     CARDIAC CATHETERIZATION  2011   CARDIAC CATHETERIZATION  2/14   [facility]; 95% lesion in the ramus intermedius.    CATARACT EXTRACTION W/PHACO Left 12/19/2018   Procedure: CATARACT EXTRACTION PHACO AND INTRAOCULAR LENS PLACEMENT ([place])  LEFT;  Surgeon: Eulogio Bear, MD;  Location: [place];  Service: Ophthalmology;  Laterality: Left;   CATARACT EXTRACTION W/PHACO Right 03/06/2019   Procedure: CATARACT EXTRACTION PHACO AND INTRAOCULAR LENS PLACEMENT (IOC)RIGHT;  Surgeon: Eulogio Bear, MD;  Location: [place];  Service: Ophthalmology;  Laterality: Right;   CORONARY ANGIOPLASTY  1996   [facility] x1   CORONARY ANGIOPLASTY  2/14   Severe restenosis in the ostial ramus. Status post angioplasty and drug-eluting stent placement with a 2.5 x 18 mm Xience drug-eluting stent   ESOPHAGOGASTRODUODENOSCOPY N/A 08/03/2018   Procedure: ESOPHAGOGASTRODUODENOSCOPY (EGD);  Surgeon: Lin Landsman, MD;  Location: [facility] ENDOSCOPY;  Service: Gastroenterology;  Laterality: N/A;   ESOPHAGOGASTRODUODENOSCOPY (EGD) WITH PROPOFOL N/A 11/03/2018   Procedure: ESOPHAGOGASTRODUODENOSCOPY (EGD) WITH PROPOFOL;  Surgeon: Lin Landsman, MD;   Location: [facility] ENDOSCOPY;  Service: Gastroenterology;  Laterality: N/A;   ESOPHAGOGASTRODUODENOSCOPY (EGD) WITH PROPOFOL N/A 12/06/2018   Procedure: ESOPHAGOGASTRODUODENOSCOPY (EGD) WITH PROPOFOL;  Surgeon: Lucilla Lame, MD;  Location: [facility] ENDOSCOPY;  Service: Endoscopy;  Laterality: N/A;   IR CT HEAD LTD  01/30/2021   IR PERCUTANEOUS ART THROMBECTOMY/INFUSION INTRACRANIAL INC DIAG ANGIO  01/30/2021   LEFT HEART CATH AND CORONARY ANGIOGRAPHY N/A 01/28/2021   Procedure: LEFT HEART CATH AND CORONARY ANGIOGRAPHY;  Surgeon: Nelva Bush, MD;  Location: [place] CV LAB;  Service: Cardiovascular;  Laterality: N/A;   RADIOLOGY WITH ANESTHESIA N/A 01/30/2021   Procedure: IR WITH ANESTHESIA;  Surgeon: Luanne Bras, MD;  Location: [place];  Service: Radiology;  Laterality: N/A;   TONSILLECTOMY AND ADENOIDECTOMY     TRANSCAROTID ARTERY REVASCULARIZATION  Left 02/07/2021   Procedure: TRANSCAROTID ARTERY REVASCULARIZATION LEFT;  Surgeon: [name], MD;  Location[facility] OR;  Service: Vascular;  Laterality: Left;    Prior to Admission medications   Medication Sig Start Date End Date Taking? Authorizing Provider  amiodarone (PACERONE) 200 MG tablet Take 1 tablet (200 mg total) by mouth daily. Patient taking differently: Take 200 mg by mouth at bedtime. 02/26/21  Yes Rinehuls, Early Chars, PA-C  aspirin EC 81 MG tablet Take 81 mg by mouth daily. Swallow whole.   Yes [provider]  carvedilol (COREG) 12.5 MG tablet TAKE 1 TABLET TWICE A DAY Patient taking differently: Take 12.5 mg by mouth 2 (two) times daily. 02/24/21  Yes [place] Hampshire, MD  clopidogrel (PLAVIX) 75 MG tablet Take 1 tablet (75 mg total) by mouth daily. 02/03/21 05/04/21 Yes Rinehuls, Early Chars, PA-C  Coenzyme Q10 (COQ10) 100 MG CAPS Take 1 capsule by mouth every evening.   Yes [provider]  lisinopril-hydrochlorothiazide (ZESTORETIC) 20-12.5 MG tablet Take 2 tablets by mouth daily.   Yes [provider]   Multiple Vitamin (MULTIVITAMIN WITH MINERALS) TABS tablet Take 1 tablet by mouth daily.   Yes [provider]  rosuvastatin (CRESTOR) 5 MG tablet TAKE 1 TABLET DAILY Patient taking differently: Take 5 mg by mouth at bedtime. 02/24/21  Yes [place] Hampshire, MD  vitamin C (ASCORBIC ACID) 500 MG tablet Take 500 mg by mouth daily.   Yes [provider]  hydrochlorothiazide (HYDRODIURIL) 25 MG tablet Take 1 tablet (25 mg total) by mouth daily. Patient not taking: No sig reported 02/03/21   Rinehuls, David L, PA-C  lisinopril (ZESTRIL) 20 MG tablet Take 2 tablets (40 mg total) by mouth daily. Patient not taking: No sig reported 02/03/21   Rinehuls, Early Chars, PA-C    Family History  Problem Relation Age of Onset   Cancer Mother 71       stomach   Heart attack Father    Hypertension Sister    Hypertension Brother      Social History   Tobacco Use   Smoking status: Former  Packs/day: 2.00    Years: 40.00    Pack years: 80.00    Types: Cigarettes    Quit date: 2004    Years since quitting: 18.4   Smokeless tobacco: Former    Types: Nurse, children's Use: Never used  Substance Use Topics   Alcohol use: Not Currently    Alcohol/week: 3.0 standard drinks    Types: 3 Cans of beer per week    Comment: occassionally   Drug use: No    Allergies as of 03/16/2021 - Review Complete 03/16/2021  Allergen Reaction Noted   Crestor [rosuvastatin]  07/14/2012   Livalo [pitavastatin]  07/14/2012   Pravachol [pravastatin sodium]  07/14/2012   Zocor [simvastatin]  07/14/2012    Review of Systems:    All systems reviewed and negative except where noted in HPI.   Physical Exam:  Vital signs in last 24 hours: Temp:  [97.8 F (36.6 C)-97.9 F (36.6 C)] 97.8 F (36.6 C) (06/12 1459) Pulse Rate:  [58-67] 60 (06/12 1459) Resp:  [14-20] 16 (06/12 1459) BP: (70-136)/(47-87) 136/66 (06/12 1459) SpO2:  [97 %-100 %] 100 % (06/12 1459) Weight:  [93 kg] 93 kg (06/12  1115)   General:   Pleasant, cooperative in NAD Head:  Normocephalic and atraumatic. Eyes:   No icterus.   Conjunctiva pink. PERRLA. Ears:  Normal auditory acuity. Neck:  Supple; no masses or thyroidomegaly Lungs: Respirations even and unlabored. Lungs clear to auscultation bilaterally.   No wheezes, crackles, or rhonchi.  Heart:  Regular rate and rhythm;  Without murmur, clicks, rubs or gallops Abdomen:  Soft, nondistended, nontender. Normal bowel sounds. No appreciable masses or hepatomegaly.  No rebound or guarding.  Rectal:  Not performed. Msk:  Symmetrical without gross deformities.   Extremities:  Without edema, cyanosis or clubbing. Neurologic:  Alert and oriented x3;  Expressive aphasia. Skin:  Intact without significant lesions or rashes. Cervical Nodes:  No significant cervical adenopathy. Psych:  Alert and cooperative. Normal affect.  LAB RESULTS: Recent Labs    03/16/21 1135  WBC 6.9  HGB 9.4*  HCT 27.7*  PLT 242   BMET Recent Labs    03/16/21 1135  NA 138  K 3.3*  CL 104  CO2 27  GLUCOSE 149*  BUN 40*  CREATININE 1.46*  CALCIUM 9.1   LFT Recent Labs    03/16/21 1135  PROT 6.3*  ALBUMIN 4.0  AST 23  ALT 16  ALKPHOS 76  BILITOT 0.6   PT/INR Recent Labs    03/16/21 1135  LABPROT 13.6  INR 1.0    STUDIES: No results found.    Impression / Plan:   Assessment: Principal Problem:   Upper GI bleeding Active Problems:   Coronary artery disease   Hyperlipidemia   AAA (abdominal aortic aneurysm) (HCC)   Chronic systolic heart failure (HCC)   Erectile dysfunction   GIB (gastrointestinal bleeding)   Primary hypertension   Pure hypercholesterolemia   Peptic ulcer disease   Duodenal ulcer disease   Sustained VT (ventricular tachycardia) (HCC)   AKI (acute kidney injury) (HCC)   Carotid stenosis   CKD (chronic kidney disease) stage 3, GFR 30-59 ml/min (HCC)   Hypokalemia   Garrett Hunter is a 75 y.o. y/o male with A history of  peptic ulcer disease who was put on Plavix and aspirin recently and has had 3 days of melena. The patient's hemoglobin had been 13 on 56 of this year and then went down  to 11.6 the next day.  He came in today with a hemoglobin of 9.4.  Plan:  The patient's hemoglobin will be monitored.  He does not appear to be having significant further bleeding.  The patient is on Plavix and it needs to be found out whether he can hold his Plavix long enough to undergo an upper endoscopy.  If he has a GI bleed from an upper GI source and he is taking Plavix it will be hard to control it therefore being off of the Plavix would be desirable. If the patient should have a significant change in his hemoglobin or signs of bleeding then an urgent or emergent procedure may be needed.  Dr. Marius Ditch will be taking over the service and following up with him tomorrow.  PPI IV twice daily  Continue serial CBCs and transfuse PRN Avoid NSAIDs Maintain 2 large-bore IV lines Please page GI with any acute hemodynamic changes, or signs of active GI bleeding   Thank you for involving me in the care of this patient.      LOS: 0 days   Lucilla Lame, MD, [facility] 03/16/2021, 6:56 PM,  Pager [phone] 7am-5pm  Check AMION for 5pm -7am coverage and on weekends   Note: This dictation was prepared with Dragon dictation along with smaller phrase technology. Any transcriptional errors that result from this process are unintentional.

## 2021-03-16 NOTE — Plan of Care (Signed)
  Problem: Clinical Measurements: Goal: Ability to maintain clinical measurements within normal limits will improve Outcome: Progressing Goal: Will remain free from infection Outcome: Progressing Goal: Diagnostic test results will improve Outcome: Progressing Goal: Cardiovascular complication will be avoided Outcome: Progressing   

## 2021-03-16 NOTE — Consult Note (Signed)
Reason for Consult:GI bleed w/hx of left TCAR Referring Physician: Dr. Janyce Llanos Garrett Hunter is an 75 y.o. male.  HPI: Come with 3 day history of melonotic stools on Plavix and ASA.  Past Medical History:  Diagnosis Date   AAA (abdominal aortic aneurysm) (HCC)    thoracoabdominal aortic aneursym, s/p right ilio-femoral bypass 10/12/16; 3V FEVAR with SMA, RRA, and LRA stenting 11/16/16 by Dr. Sammuel Hines [facility])   Benign neoplasm of colon    patient not sure   CHF (congestive heart failure) (HCC)    EF 45-50%   Chronic airway obstruction, not elsewhere classified    pt denies   Coronary artery disease    Inferior MI in 1996 treated with TPA. Cardiac cath in 2011 at [facility] showed chronic occlusion of the proximal RCA and proximal left circumflex without obstructive disease in the LAD. Non-ST elevation myocardial infarction in October 2013 while on vacation in [place]. Cardiac catheterization showed plaque rupture in the proximal ramus with thrombus. He had balloon angioplasty done. 01/28/21 cath    Coronary atherosclerosis of native coronary artery    Hepatitis A    teenager, no longer a problem   Hyperlipidemia    Intolerance to statins.   Hypertension    MI (myocardial infarction) ([place])    x 3 - last one 2013   Renal artery stenosis (HCC)    s/p bilateral renal artery angioplasty 07/26/18   Stroke ([place]) 01/2021   Ventricular tachycardia ([place])    s/p cardioverson 01/27/21    Past Surgical History:  Procedure Laterality Date   ABDOMINAL AORTIC ANEURYSM REPAIR     11/16/2016   ANGIOPLASTY     CARDIAC CATHETERIZATION  2011   CARDIAC CATHETERIZATION  2/14   [facility]; 95% lesion in the ramus intermedius.    CATARACT EXTRACTION W/PHACO Left 12/19/2018   Procedure: CATARACT EXTRACTION PHACO AND INTRAOCULAR LENS PLACEMENT ([place])  LEFT;  Surgeon: Eulogio Bear, MD;  Location: [place];  Service: Ophthalmology;  Laterality: Left;   CATARACT EXTRACTION W/PHACO Right 03/06/2019   Procedure:  CATARACT EXTRACTION PHACO AND INTRAOCULAR LENS PLACEMENT (IOC)RIGHT;  Surgeon: Eulogio Bear, MD;  Location: [place];  Service: Ophthalmology;  Laterality: Right;   CORONARY ANGIOPLASTY  1996   [facility] x1   CORONARY ANGIOPLASTY  2/14   Severe restenosis in the ostial ramus. Status post angioplasty and drug-eluting stent placement with a 2.5 x 18 mm Xience drug-eluting stent   ESOPHAGOGASTRODUODENOSCOPY N/A 08/03/2018   Procedure: ESOPHAGOGASTRODUODENOSCOPY (EGD);  Surgeon: Lin Landsman, MD;  Location: [facility] ENDOSCOPY;  Service: Gastroenterology;  Laterality: N/A;   ESOPHAGOGASTRODUODENOSCOPY (EGD) WITH PROPOFOL N/A 11/03/2018   Procedure: ESOPHAGOGASTRODUODENOSCOPY (EGD) WITH PROPOFOL;  Surgeon: Lin Landsman, MD;  Location: [facility] ENDOSCOPY;  Service: Gastroenterology;  Laterality: N/A;   ESOPHAGOGASTRODUODENOSCOPY (EGD) WITH PROPOFOL N/A 12/06/2018   Procedure: ESOPHAGOGASTRODUODENOSCOPY (EGD) WITH PROPOFOL;  Surgeon: Lucilla Lame, MD;  Location: [facility] ENDOSCOPY;  Service: Endoscopy;  Laterality: N/A;   IR CT HEAD LTD  01/30/2021   IR PERCUTANEOUS ART THROMBECTOMY/INFUSION INTRACRANIAL INC DIAG ANGIO  01/30/2021   LEFT HEART CATH AND CORONARY ANGIOGRAPHY N/A 01/28/2021   Procedure: LEFT HEART CATH AND CORONARY ANGIOGRAPHY;  Surgeon: Nelva Bush, MD;  Location: [place] CV LAB;  Service: Cardiovascular;  Laterality: N/A;   RADIOLOGY WITH ANESTHESIA N/A 01/30/2021   Procedure: IR WITH ANESTHESIA;  Surgeon: Luanne Bras, MD;  Location: [place];  Service: Radiology;  Laterality: N/A;   TONSILLECTOMY AND ADENOIDECTOMY  TRANSCAROTID ARTERY REVASCULARIZATION  Left 02/07/2021   Procedure: TRANSCAROTID ARTERY REVASCULARIZATION LEFT;  Surgeon: [name], MD;  Location: [facility] OR;  Service: Vascular;  Laterality: Left;    Family History  Problem Relation Age of Onset   Cancer Mother 18       stomach   Heart attack Father    Hypertension Sister    Hypertension  Brother     Social History:  reports that he quit smoking about 18 years ago. His smoking use included cigarettes. He has a 80.00 pack-year smoking history. He has quit using smokeless tobacco.  His smokeless tobacco use included chew. He reports previous alcohol use of about 3.0 standard drinks of alcohol per week. He reports that he does not use drugs.  Allergies:  Allergies  Allergen Reactions   Crestor [Rosuvastatin]     Cramps. (Hight dose of Crestor)   Livalo [Pitavastatin]     myalgia   Pravachol [Pravastatin Sodium]     Cramps   Zocor [Simvastatin]     Cramps    Medications: I have reviewed the patient's current medications.  Results for orders placed or performed during the hospital encounter of 03/16/21 (from the past 48 hour(s))  Type and screen [place]     Status: None   Collection Time: 03/16/21 11:29 AM  Result Value Ref Range   ABO/RH(D) O NEG    Antibody Screen NEG    Sample Expiration      03/19/2021,2359 Performed at [facility], [address] [place], [place] [address]   CBC with Differential     Status: Abnormal   Collection Time: 03/16/21 11:35 AM  Result Value Ref Range   WBC 6.9 4.0 - 10.5 K/uL   RBC 2.81 (L) 4.22 - 5.81 MIL/uL   Hemoglobin 9.4 (L) 13.0 - 17.0 g/dL   HCT 27.7 (L) 39.0 - 52.0 %   MCV 98.6 80.0 - 100.0 fL   MCH 33.5 26.0 - 34.0 pg   MCHC 33.9 30.0 - 36.0 g/dL   RDW 14.5 11.5 - 15.5 %   Platelets 242 150 - 400 K/uL   nRBC 0.0 0.0 - 0.2 %   Neutrophils Relative % 74 %   Neutro Abs 5.1 1.7 - 7.7 K/uL   Lymphocytes Relative 18 %   Lymphs Abs 1.2 0.7 - 4.0 K/uL   Monocytes Relative 6 %   Monocytes Absolute 0.4 0.1 - 1.0 K/uL   Eosinophils Relative 1 %   Eosinophils Absolute 0.1 0.0 - 0.5 K/uL   Basophils Relative 0 %   Basophils Absolute 0.0 0.0 - 0.1 K/uL   Immature Granulocytes 1 %   Abs Immature Granulocytes 0.04 0.00 - 0.07 K/uL    Comment: Performed at [facility], [address] [place], [place] [address]  Comprehensive metabolic panel     Status: Abnormal   Collection Time: 03/16/21 11:35 AM  Result Value Ref Range   Sodium 138 135 - 145 mmol/L   Potassium 3.3 (L) 3.5 - 5.1 mmol/L   Chloride 104 98 - 111 mmol/L   CO2 27 22 - 32 mmol/L   Glucose, Bld 149 (H) 70 - 99 mg/dL    Comment: Glucose reference range applies only to samples taken after fasting for at least 8 hours.   BUN 40 (H) 8 - 23 mg/dL   Creatinine, Ser 1.46 (H) 0.61 - 1.24 mg/dL   Calcium 9.1 8.9 - 10.3 mg/dL   Total Protein 6.3 (L)  6.5 - 8.1 g/dL   Albumin 4.0 3.5 - 5.0 g/dL   AST 23 15 - 41 U/L   ALT 16 0 - 44 U/L   Alkaline Phosphatase 76 38 - 126 U/L   Total Bilirubin 0.6 0.3 - 1.2 mg/dL   GFR, Estimated 51 (L) >60 mL/min    Comment: (NOTE) Calculated using the CKD-EPI Creatinine Equation (2021)    Anion gap 7 5 - 15    Comment: Performed at [facility], [address] [place], [place] [address]  Lipase, blood     Status: None   Collection Time: 03/16/21 11:35 AM  Result Value Ref Range   Lipase 25 11 - 51 U/L    Comment: Performed at [facility], [address] [place], [place] [address]  Protime-INR     Status: None   Collection Time: 03/16/21 11:35 AM  Result Value Ref Range   Prothrombin Time 13.6 11.4 - 15.2 seconds   INR 1.0 0.8 - 1.2    Comment: (NOTE) INR goal varies based on device and disease states. Performed at [facility], [address] [place], [place] [address]   APTT     Status: None   Collection Time: 03/16/21 11:35 AM  Result Value Ref Range   aPTT 27 24 - 36 seconds    Comment: Performed at [facility], [address] [place], [place] [address]  Resp Panel by RT-PCR (Flu A&B, Covid) Nasopharyngeal Swab     Status: None   Collection Time: 03/16/21  1:24 PM   Specimen: Nasopharyngeal Swab; Nasopharyngeal(NP) swabs in vial transport medium  Result Value Ref Range   SARS Coronavirus 2 by RT PCR NEGATIVE  NEGATIVE    Comment: (NOTE) SARS-CoV-2 target nucleic acids are NOT DETECTED.  The SARS-CoV-2 RNA is generally detectable in upper respiratory specimens during the acute phase of infection. The lowest concentration of SARS-CoV-2 viral copies this assay can detect is 138 copies/mL. A negative result does not preclude SARS-Cov-2 infection and should not be used as the sole basis for treatment or other patient management decisions. A negative result may occur with  improper specimen collection/handling, submission of specimen other than nasopharyngeal swab, presence of viral mutation(s) within the areas targeted by this assay, and inadequate number of viral copies(<138 copies/mL). A negative result must be combined with clinical observations, patient history, and epidemiological information. The expected result is Negative.  Fact Sheet for Patients:  EntrepreneurPulse.com.au  Fact Sheet for Healthcare Providers:  IncredibleEmployment.be  This test is no t yet approved or cleared by the [place] FDA and  has been authorized for detection and/or diagnosis of SARS-CoV-2 by FDA under an Emergency Use Authorization (EUA). This EUA will remain  in effect (meaning this test can be used) for the duration of the COVID-19 declaration under Section 564(b)(1) of the Act, 21 U.S.C.section 360bbb-3(b)(1), unless the authorization is terminated  or revoked sooner.       Influenza A by PCR NEGATIVE NEGATIVE   Influenza B by PCR NEGATIVE NEGATIVE    Comment: (NOTE) The Xpert Xpress SARS-CoV-2/FLU/RSV plus assay is intended as an aid in the diagnosis of influenza from Nasopharyngeal swab specimens and should not be used as a sole basis for treatment. Nasal washings and aspirates are unacceptable for Xpert Xpress SARS-CoV-2/FLU/RSV testing.  Fact Sheet for Patients: EntrepreneurPulse.com.au  Fact Sheet for Healthcare  Providers: IncredibleEmployment.be  This test is not yet approved or cleared by the [place] FDA and has been  authorized for detection and/or diagnosis of SARS-CoV-2 by FDA under an Emergency Use Authorization (EUA). This EUA will remain in effect (meaning this test can be used) for the duration of the COVID-19 declaration under Section 564(b)(1) of the Act, 21 U.S.C. section 360bbb-3(b)(1), unless the authorization is terminated or revoked.  Performed at [facility], [address] [place], [place] [address]     No results found.  Review of Systems  All other systems reviewed and are negative. Blood pressure 136/66, pulse 60, temperature 97.8 F (36.6 C), temperature source Oral, resp. rate 16, height 5\' 11"  (1.803 m), weight 93 kg, SpO2 100 %. Physical Exam Vitals and nursing note reviewed.  Constitutional:      Appearance: Normal appearance. He is obese.  HENT:     Head: Normocephalic.     Nose: Nose normal.  Eyes:     Pupils: Pupils are equal, round, and reactive to light.  Cardiovascular:     Rate and Rhythm: Normal rate and regular rhythm.     Pulses: Normal pulses.  Pulmonary:     Effort: Pulmonary effort is normal.  Abdominal:     General: Bowel sounds are normal.  Musculoskeletal:        General: Normal range of motion.     Cervical back: Normal range of motion and neck supple.  Skin:    General: Skin is warm.  Neurological:     General: No focal deficit present.     Mental Status: He is alert and oriented to person, place, and time. Mental status is at baseline.     Comments: Residual dysphasia  Psychiatric:        Mood and Affect: Mood normal.        Behavior: Behavior normal.        Thought Content: Thought content normal.        Judgment: Judgment normal.    Assessment/Plan: Okay to hold ASA&Plavix for now until hemoglobin stable and origin of bleeding addressed.  He will need EGD & Colonoscopy sooner than later.    He will need to return to his DAPT as soon as possible. Elmore Guise 03/16/2021, 3:19 PM

## 2021-03-16 NOTE — Progress Notes (Signed)
Patient admitted to room 204 from ED d/t GI bleed.  A+Ox4, residual dysphasia.  VSS.  On Telemetry and patient has own telemetry device in place.  No complaints of pain.  NPO after midnight.  Assessment completed.  Call bell and needs within reach.  Will continue to monitor and assess with plan of care.

## 2021-03-16 NOTE — ED Provider Notes (Signed)
[facility] Emergency Department Provider Note  ____________________________________________   Event Date/Time   First MD Initiated Contact with Patient 03/16/21 1119     (approximate)  I have reviewed the triage vital signs and the nursing notes.   HISTORY  Chief Complaint Rectal Bleeding and Dizziness    HPI Garrett Hunter is a 75 y.o. male with known aortic aneurysm, CHF, coronary disease who comes in for rectal bleeding.  Patient had recent admission with left MCA occlusion status post tPA on 01/30/2021 which is left him with some expressive aphasia.  He is on aspirin and Plavix.  Patient reports 3 days of dark stools.  Does report a history of an ulcer years ago that had to have cauterization.  States that today he felt more weak and they noticed that his skin was more pale, constant, nothing makes it better, nothing makes it worse.  Therefore they sent in due to concern for GI bleed.  Denies any history of liver dysfunction or history of alcohol use or abuse            Past Medical History:  Diagnosis Date   AAA (abdominal aortic aneurysm) (HCC)    thoracoabdominal aortic aneursym, s/p right ilio-femoral bypass 10/12/16; 3V FEVAR with SMA, RRA, and LRA stenting 11/16/16 by Dr. Sammuel Hines [facility])   Benign neoplasm of colon    patient not sure   CHF (congestive heart failure) (HCC)    EF 45-50%   Chronic airway obstruction, not elsewhere classified    pt denies   Coronary artery disease    Inferior MI in 1996 treated with TPA. Cardiac cath in 2011 at [facility] showed chronic occlusion of the proximal RCA and proximal left circumflex without obstructive disease in the LAD. Non-ST elevation myocardial infarction in October 2013 while on vacation in [place]. Cardiac catheterization showed plaque rupture in the proximal ramus with thrombus. He had balloon angioplasty done. 01/28/21 cath    Coronary atherosclerosis of native coronary artery    Hepatitis A     teenager, no longer a problem   Hyperlipidemia    Intolerance to statins.   Hypertension    MI (myocardial infarction) ([place])    x 3 - last one 2013   Renal artery stenosis (HCC)    s/p bilateral renal artery angioplasty 07/26/18   Stroke ([employer]) 01/2021   Ventricular tachycardia ([place])    s/p cardioverson 01/27/21    Patient Active Problem List   Diagnosis Date Noted   Carotid stenosis 02/07/2021   Cerebral thrombosis with cerebral infarction 01/30/2021   Cerebral embolism with cerebral infarction 01/30/2021   Sustained ventricular tachycardia ([place]) 01/28/2021   AKI (acute kidney injury) ([place])    Sustained VT (ventricular tachycardia) ([place]) 01/27/2021   Peptic ulcer disease    Duodenal ulcer disease    GIB (gastrointestinal bleeding) 08/02/2018   Diabetes mellitus without complication ([place]) 95/62/1308   Erectile dysfunction 07/01/2015   Primary hypertension 08/09/2014   Pure hypercholesterolemia 65/78/4696   Chronic systolic heart failure ([place]) 08/04/2012   Coronary artery disease    Hyperlipidemia    AAA (abdominal aortic aneurysm) (HCC)    NSTEMI (non-ST elevated myocardial infarction) ([place]) 07/09/2012    Past Surgical History:  Procedure Laterality Date   ABDOMINAL AORTIC ANEURYSM REPAIR     11/16/2016   ANGIOPLASTY     CARDIAC CATHETERIZATION  2011   CARDIAC CATHETERIZATION  2/14   [facility]; 95% lesion in the ramus intermedius.    CATARACT EXTRACTION W/PHACO  Left 12/19/2018   Procedure: CATARACT EXTRACTION PHACO AND INTRAOCULAR LENS PLACEMENT (IOC)  LEFT;  Surgeon: Eulogio Bear, MD;  Location: [place];  Service: Ophthalmology;  Laterality: Left;   CATARACT EXTRACTION W/PHACO Right 03/06/2019   Procedure: CATARACT EXTRACTION PHACO AND INTRAOCULAR LENS PLACEMENT (IOC)RIGHT;  Surgeon: Eulogio Bear, MD;  Location: [place];  Service: Ophthalmology;  Laterality: Right;   CORONARY ANGIOPLASTY  1996   [facility] x1   CORONARY ANGIOPLASTY  2/14   Severe  restenosis in the ostial ramus. Status post angioplasty and drug-eluting stent placement with a 2.5 x 18 mm Xience drug-eluting stent   ESOPHAGOGASTRODUODENOSCOPY N/A 08/03/2018   Procedure: ESOPHAGOGASTRODUODENOSCOPY (EGD);  Surgeon: Lin Landsman, MD;  Location[facility] ENDOSCOPY;  Service: Gastroenterology;  Laterality: N/A;   ESOPHAGOGASTRODUODENOSCOPY (EGD) WITH PROPOFOL N/A 11/03/2018   Procedure: ESOPHAGOGASTRODUODENOSCOPY (EGD) WITH PROPOFOL;  Surgeon: Lin Landsman, MD;  Location: [facility] ENDOSCOPY;  Service: Gastroenterology;  Laterality: N/A;   ESOPHAGOGASTRODUODENOSCOPY (EGD) WITH PROPOFOL N/A 12/06/2018   Procedure: ESOPHAGOGASTRODUODENOSCOPY (EGD) WITH PROPOFOL;  Surgeon: Lucilla Lame, MD;  Location: [facility] ENDOSCOPY;  Service: Endoscopy;  Laterality: N/A;   IR CT HEAD LTD  01/30/2021   IR PERCUTANEOUS ART THROMBECTOMY/INFUSION INTRACRANIAL INC DIAG ANGIO  01/30/2021   LEFT HEART CATH AND CORONARY ANGIOGRAPHY N/A 01/28/2021   Procedure: LEFT HEART CATH AND CORONARY ANGIOGRAPHY;  Surgeon: Nelva Bush, MD;  Location: [place] CV LAB;  Service: Cardiovascular;  Laterality: N/A;   RADIOLOGY WITH ANESTHESIA N/A 01/30/2021   Procedure: IR WITH ANESTHESIA;  Surgeon: Luanne Bras, MD;  Location: [place];  Service: Radiology;  Laterality: N/A;   TONSILLECTOMY AND ADENOIDECTOMY     TRANSCAROTID ARTERY REVASCULARIZATION  Left 02/07/2021   Procedure: TRANSCAROTID ARTERY REVASCULARIZATION LEFT;  Surgeon: [name], MD;  Location: [facility] OR;  Service: Vascular;  Laterality: Left;    Prior to Admission medications   Medication Sig Start Date End Date Taking? Authorizing Provider  amiodarone (PACERONE) 200 MG tablet Take 1 tablet (200 mg total) by mouth daily. 02/26/21   Rinehuls, Early Chars, PA-C  amiodarone (PACERONE) 400 MG tablet Take 1 tablet (400 mg total) by mouth daily for 14 days. 02/12/21 02/26/21  Rinehuls, Early Chars, PA-C  amiodarone (PACERONE) 400 MG tablet Take 1 tablet (400 mg  total) by mouth 2 (two) times daily for 10 days. 02/02/21 02/12/21  Rinehuls, Early Chars, PA-C  aspirin EC 81 MG tablet Take 81 mg by mouth daily. Swallow whole.    [provider]  calcium-vitamin D (OSCAL WITH D) 500-200 MG-UNIT tablet Take 1 tablet by mouth daily with breakfast.    [provider]  carvedilol (COREG) 12.5 MG tablet TAKE 1 TABLET TWICE A DAY 02/24/21   Wellington Hampshire, MD  clopidogrel (PLAVIX) 75 MG tablet Take 1 tablet (75 mg total) by mouth daily. 02/03/21 05/04/21  Rinehuls, Early Chars, PA-C  Coenzyme Q10 (COQ10 PO) Take by mouth.    [provider]  hydrochlorothiazide (HYDRODIURIL) 25 MG tablet Take 1 tablet (25 mg total) by mouth daily. 02/03/21   Rinehuls, David L, PA-C  lisinopril (ZESTRIL) 20 MG tablet Take 2 tablets (40 mg total) by mouth daily. 02/03/21   Rinehuls, Early Chars, PA-C  Multiple Vitamin (MULTIVITAMIN WITH MINERALS) TABS tablet Take 1 tablet by mouth daily.    [provider]  nystatin (MYCOSTATIN) 100000 UNIT/ML suspension Use as directed 5 mLs (500,000 Units total) in the mouth or throat 4 (four) times daily. Swish and spit 02/07/21  Dagoberto Ligas, PA-C  rosuvastatin (CRESTOR) 5 MG tablet TAKE 1 TABLET DAILY 02/24/21   Wellington Hampshire, MD  vitamin C (ASCORBIC ACID) 500 MG tablet Take 500 mg by mouth daily.    [provider]    Allergies Crestor [rosuvastatin], Livalo [pitavastatin], Pravachol [pravastatin sodium], and Zocor [simvastatin]  Family History  Problem Relation Age of Onset   Cancer Mother 67       stomach   Heart attack Father    Hypertension Sister    Hypertension Brother     Social History Social History   Tobacco Use   Smoking status: Former    Packs/day: 2.00    Years: 40.00    Pack years: 80.00    Types: Cigarettes    Quit date: 2004    Years since quitting: 18.4   Smokeless tobacco: Former    Types: Nurse, children's Use: Never used  Substance Use Topics   Alcohol use: Not  Currently    Alcohol/week: 3.0 standard drinks    Types: 3 Cans of beer per week    Comment: occassionally   Drug use: No      Review of Systems Constitutional: No fever/chills Eyes: No visual changes. ENT: No sore throat. Cardiovascular: Denies chest pain. Respiratory: Denies shortness of breath. Gastrointestinal: No abdominal pain.  No nausea, no vomiting.  No diarrhea.  No constipation.Positive melena Genitourinary: Negative for dysuria.   Musculoskeletal: Negative for back pain. Skin: Negative for rash. Neurological: Negative for headaches, focal weakness or numbness. All other ROS negative ____________________________________________   PHYSICAL EXAM:  VITAL SIGNS: ED Triage Vitals  Enc Vitals Group     BP 03/16/21 1113 (!) 70/47     Pulse Rate 03/16/21 1113 67     Resp 03/16/21 1113 20     Temp 03/16/21 1113 97.9 F (36.6 C)     Temp src --      SpO2 03/16/21 1113 97 %     Weight 03/16/21 1115 205 lb (93 kg)     Height 03/16/21 1115 5\' 11"  (1.803 m)     Head Circumference --      Peak Flow --      Pain Score 03/16/21 1115 0     Pain Loc --      Pain Edu? --      Excl. in [place]? --     Constitutional: Alert and oriented. Well appearing and in no acute distress. Eyes: Conjunctivae are normal. EOMI. Head: Atraumatic. Nose: No congestion/rhinnorhea. Mouth/Throat: Mucous membranes are moist.   Neck: No stridor. Trachea Midline. FROM Cardiovascular: Normal rate, regular rhythm. Grossly normal heart sounds.  Good peripheral circulation. Respiratory: Normal respiratory effort.  No retractions. Lungs CTAB. Gastrointestinal: Soft and nontender. No distention. No abdominal bruits.  Musculoskeletal: No lower extremity tenderness nor edema.  No joint effusions. Neurologic:  Normal speech and language. No gross focal neurologic deficits are appreciated.  Skin:  Skin is warm, dry and intact. No rash noted. Psychiatric: Mood and affect are normal. Speech and behavior are  normal. GU: Dark stool, Hemoccult positive ____________________________________________   LABS (all labs ordered are listed, but only abnormal results are displayed)  Labs Reviewed  CBC WITH DIFFERENTIAL/PLATELET - Abnormal; Notable for the following components:      Result Value   RBC 2.81 (*)    Hemoglobin 9.4 (*)    HCT 27.7 (*)    All other components within normal limits  COMPREHENSIVE METABOLIC PANEL - Abnormal;  Notable for the following components:   Potassium 3.3 (*)    Glucose, Bld 149 (*)    BUN 40 (*)    Creatinine, Ser 1.46 (*)    Total Protein 6.3 (*)    GFR, Estimated 51 (*)    All other components within normal limits  LIPASE, BLOOD  PROTIME-INR  APTT  TYPE AND SCREEN   ____________________________________________   ED ECG REPORT I, Vanessa Catawba, the attending physician, personally viewed and interpreted this ECG.  Normal sinus rate of 66, no ST elevation, no T wave inversions, slight elevated QRS ____________________________________________    PROCEDURES  Procedure(s) performed (including Critical Care):  .1-3 Lead EKG Interpretation  Date/Time: 03/16/2021 12:18 PM Performed by: Vanessa Milan, MD Authorized by: Vanessa Socorro, MD     Interpretation: normal     ECG rate:  60s   ECG rate assessment: normal     Rhythm: sinus rhythm     Ectopy: none     Conduction: normal     ____________________________________________   INITIAL IMPRESSION / ASSESSMENT AND PLAN / ED COURSE  Garrett Hunter was evaluated in Emergency Department on 03/16/2021 for the symptoms described in the history of present illness. He was evaluated in the context of the global COVID-19 pandemic, which necessitated consideration that the patient might be at risk for infection with the SARS-CoV-2 virus that causes COVID-19. Institutional protocols and algorithms that pertain to the evaluation of patients at risk for COVID-19 are in a state of rapid change based on information  released by regulatory bodies including the CDC and federal and state organizations. These policies and algorithms were followed during the patient's care in the ED.    .  75 year old on aspirin Plavix who comes in for melena with a history of ulcer.  I suspect this is most likely from an upper GI bleed.  Lower suspicion for variceal bleed.  Lower suspicion for lower GI bleed.  Patient was initially hypertensive but came up after fluids.  Labs ordered to evaluate for anemia and hemoglobin is low at 9.4 and downtrending from his prior checks.  His rectal exam is dark stool, Hemoccult positive.  I did let Dr. Allen Norris from GI no about patient.  Will admit patient       ____________________________________________   FINAL CLINICAL IMPRESSION(S) / ED DIAGNOSES   Final diagnoses:  Gastrointestinal hemorrhage, unspecified gastrointestinal hemorrhage type  Dizziness      MEDICATIONS GIVEN DURING THIS VISIT:  Medications  pantoprazole (PROTONIX) 80 mg in sodium chloride 0.9 % 100 mL IVPB (has no administration in time range)  pantoprazole (PROTONIX) 80 mg in sodium chloride 0.9 % 100 mL (0.8 mg/mL) infusion (has no administration in time range)  pantoprazole (PROTONIX) injection 40 mg (has no administration in time range)  sodium chloride 0.9 % bolus 1,000 mL (has no administration in time range)     ED Discharge Orders     None        Note:  This document was prepared using Dragon voice recognition software and may include unintentional dictation errors.    Vanessa [place], MD 03/16/21 [phone]

## 2021-03-16 NOTE — ED Triage Notes (Signed)
Pt via POV from home. Pt has been having dark tarry stool and weakness for the past couple of days. Pt also has been weak and dizzy, pt is on blood thinners. Pt has a hx recent hx of a stroke and cardiac stent placement. Pt has hx of expressive dysphagia from his stroke. Pt is A&Ox4 and NAD.

## 2021-03-16 NOTE — H&P (Addendum)
History and Physical   Garrett Hunter YKD:[id number] DOB: 05/20/1946 DOA: 03/16/2021  PCP: Derinda Late, MD  Outpatient Specialists: Dr. Corky Sox Patient coming from: home  I have personally briefly reviewed patient's old medical records in [place].  Chief Concern: melena stool  HPI: Garrett Hunter is a 75 y.o. male with medical history significant for gastric ulcer s/p cauterization, cad s/p pci, left MCA 01/30/2021 status post unsuccessful thrombectomy and received tPA, left carotid stenosis status post left TCAR, was recommended by neurology on 02/02/2021 that he remain on aspirin and Plavix for 3 months, history of wide-complex ventricular tachycardia, on amiodarone, hypertension, hyperlipidemia, presents to the emergency department for chief concerns of melena stool.  He reports the melena has been ongoing for 3 days.  He does not take iron tablets.  He denies bright red blood per rectum.  He denies chest pain, shortness of breath, abdominal pain, diarrhea, dysphagia, dysuria, hematuria. He denies overt changes to weakness.  He endorses that he has taken his Plavix this a.m. prior to ED presentation.  He has not taken his aspirin, or nightly amiodarone 200 mg dosing.  Social history: lives at home with spouse, Garrett Hunter. He formerly smoked 2 ppd, quit 18 years. Occassinally drinks etoh, last drink was 2-3 days ago and it was one beer. 12 oz beers. He denies recreational drug use. He is retired, formerly a Press photographer associated at [employer]  Vaccinations: He is not vaccinated for covid 19  ROS: Constitutional: no weight change, no fever ENT/Mouth: no sore throat, no rhinorrhea Eyes: no eye pain, no vision changes Cardiovascular: no chest pain, no dyspnea,  no edema, no palpitations Respiratory: no cough, no sputum, no wheezing Gastrointestinal: no nausea, no vomiting, no diarrhea, no constipation,+ melena stool Genitourinary: no urinary incontinence, no dysuria, no  hematuria Musculoskeletal: no arthralgias, no myalgias Skin: no skin lesions, no pruritus, Neuro: + weakness, no loss of consciousness, no syncope Psych: no anxiety, no depression, + decrease appetite Heme/Lymph: no bruising, + melena stool   ED Course: Discussed with ED provider, patient requiring hospitalization for melena stool with decreased hemoglobin.  Vitals in the emergency department was remarkable for temperature of 97.9, respiration rate of 15, heart rate 61, blood pressure 100/87, SPO2 of 100% on room air.  Labs in the emergency department was remarkable for hemoglobin 9.4, hematocrit 27.7, platelets 242. Serum sodium 138, potassium 3.3, chloride 104, bicarb 27, BUN 40, serum creatinine of 1.  146, nonfasting blood glucose 149, EGFR 51.  EDP ordered Protonix gtt. and loading dose.  EDP consulted Dr. Allen Norris.  Assessment/Plan  Principal Problem:   Upper GI bleeding Active Problems:   Coronary artery disease   Hyperlipidemia   AAA (abdominal aortic aneurysm) (HCC)   Chronic systolic heart failure (HCC)   Erectile dysfunction   GIB (gastrointestinal bleeding)   Primary hypertension   Pure hypercholesterolemia   Peptic ulcer disease   Duodenal ulcer disease   Sustained VT (ventricular tachycardia) (HCC)   AKI (acute kidney injury) (HCC)   Carotid stenosis   CKD (chronic kidney disease) stage 3, GFR 30-59 ml/min (HCC)   Hypokalemia   # Melena stool-suspect upper GI bleed - GI Dr. Allen Norris has been consulted by ED provider and we look forward to his recommendations - Status post Protonix loading and GTT - We will continue Protonix - Neurology, Dr. Curly Shores has been consulted via secure chat - Vascular, Dr. Feliberto Gottron has been consulted via secure chat - Took Plavix a.m. prior to presentation  to the ED, has not taken his aspirin yet - I have held Plavix and aspirin at this time pending GI and vascular evaluation - Patient has been typed and screened - CBC in the a.m., n.p.o.  after midnight  # Mild acute kidney injury-suspect secondary to blood loss in setting of upper GI bleed - Status post normal saline 1 L bolus - LR 100 mL/h, for 10 hours, to complete 1 L bag - BMP in a.m.  # Moderate anemia secondary to blood loss in setting of upper GI bleed - Does not meet criteria for blood transfusion at this time - Patient has been typed and screened - CBC in the a.m.  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 Bhagat has been consulted via secure chat and is okay with holding aspirin and Plavix for now and recommends hospitalist team to consult vascular  # Left carotid stenosis-status post TCAR on 02/07/2021 - Vascular, Dr. Antezana has been consulted for recommendations Plavix and aspirin and agrees that plavix and asa should be held at this time  # Hypokalemia-suspect secondary to hydrochlorothiazide use - replaced with potassium chloride 40 mill equivalent p.o., once - Check magnesium level as an add-on lab - BMP in the a.m.  # Left upper lobe nodule that is 4 mm-outpatient follow-up  # History of wide-complex ventricular tachycardia-status post cardioversion, resumed amiodarone 200 mg p.o. daily - 12.5 mg twice daily resumed  # Hypertension -patient takes hydrochlorothiazide 25 mg daily, lisinopril 20 mg daily, carvedilol 12.5 mg twice daily at home - I have held hydrochlorothiazide and lisinopril at this time as patient is low normotensive and acute kidney injury - Carvedilol 12.5 mg BID has been resumed   # Hyperlipidemia-rosuvastatin 5 mg nightly resumed  # History of duodenal and gastric ulcer-GI has been consulted, recommend follow-up with GI outpatient as  # History of  COVID-19 infection-December 2021  # Pharmacologic DVT prophylaxis has not been started by myself due to patient has anemia secondary to upper GI bleed - A.m. team to initiate pharmacologic DVT prophylaxis when appropriate and safe  # COVID PCR/influenza A/influenza B result-negative  # Pending medication reconciliation completion  A.m. labs ordered: CBC, BMP, magnesium  Chart reviewed.   01/28/2021 Complete echo without imaging enhancing agent-left ventricular ejection fraction was estimated at 45 to 50%, left ventricle has mildly decreased function.  Left ventricle demonstrated global hypokinesis.  Mild left ventricular hypertrophy.  Left ventricular diastolic parameters are consistent with grade 2 diastolic dysfunction.  There is akinesis of the left ventricular, basal inferior segment.  Right ventricular systolic function is normal.  DVT prophylaxis: TED hose Code Status: Full code  Diet: Heart healthy, n.p.o. at midnight Family Communication: Updated spouse at bedside, Carol Disposition Plan: pending clinical course Consults called: GI, vascular, neurology Admission status: Observation, MedSurg, with telemetry for 24 hours  Past Medical History:  Diagnosis Date   AAA (abdominal aortic aneurysm) (HCC)    thoracoabdominal aortic aneursym, s/p right ilio-femoral bypass 10/12/16; 3V FEVAR with SMA, RRA, and LRA stenting 11/16/16 by Dr. Farber ([facility])   Benign neoplasm of colon    patient not sure   CHF (congestive heart failure) (HCC)    EF 45-50%   Chronic airway obstruction, not   elsewhere classified    pt denies   Coronary artery disease    Inferior MI in 1996 treated with TPA. Cardiac cath in 2011 at [facility] showed chronic occlusion of the proximal RCA and proximal left circumflex without obstructive disease in the LAD. Non-ST elevation myocardial infarction in October 2013 while on vacation in [place]. Cardiac catheterization showed plaque rupture in the proximal ramus with thrombus. He  had balloon angioplasty done. 01/28/21 cath    Coronary atherosclerosis of native coronary artery    Hepatitis A    teenager, no longer a problem   Hyperlipidemia    Intolerance to statins.   Hypertension    MI (myocardial infarction) ([place])    x 3 - last one 2013   Renal artery stenosis (HCC)    s/p bilateral renal artery angioplasty 07/26/18   Stroke ([place]) 01/2021   Ventricular tachycardia ([place])    s/p cardioverson 01/27/21   Past Surgical History:  Procedure Laterality Date   ABDOMINAL AORTIC ANEURYSM REPAIR     11/16/2016   ANGIOPLASTY     CARDIAC CATHETERIZATION  2011   CARDIAC CATHETERIZATION  2/14   [facility]; 95% lesion in the ramus intermedius.    CATARACT EXTRACTION W/PHACO Left 12/19/2018   Procedure: CATARACT EXTRACTION PHACO AND INTRAOCULAR LENS PLACEMENT ([place])  LEFT;  Surgeon: Eulogio Bear, MD;  Location: [place];  Service: Ophthalmology;  Laterality: Left;   CATARACT EXTRACTION W/PHACO Right 03/06/2019   Procedure: CATARACT EXTRACTION PHACO AND INTRAOCULAR LENS PLACEMENT (I[facility])RIGHT;  Surgeon: Eulogio Bear, MD;  Location: [place];  Service: Ophthalmology;  Laterality: Right;   CORONARY ANGIOPLASTY  1996   [facility] x1   CORONARY ANGIOPLASTY  2/14   Severe restenosis in the ostial ramus. Status post angioplasty and drug-eluting stent placement with a 2.5 x 18 mm Xience drug-eluting stent   ESOPHAGOGASTRODUODENOSCOPY N/A 08/03/2018   Procedure: ESOPHAGOGASTRODUODENOSCOPY (EGD);  Surgeon: Lin Landsman, MD;  Location: [facility] ENDOSCOPY;  Service: Gastroenterology;  Laterality: N/A;   ESOPHAGOGASTRODUODENOSCOPY (EGD) WITH PROPOFOL N/A 11/03/2018   Procedure: ESOPHAGOGASTRODUODENOSCOPY (EGD) WITH PROPOFOL;  Surgeon: Lin Landsman, MD;  [facility]COPY;  Service: Gastroenterology;  Laterality: N/A;   ESOPHAGOGASTRODUODENOSCOPY (EGD) WITH PROPOFOL N/A 12/06/2018   Procedure: ESOPHAGOGASTRODUODENOSCOPY (EGD) WITH PROPOFOL;  Surgeon: Lucilla Lame, MD;  Location: [facility] ENDOSCOPY;  Service: Endoscopy;  Laterality: N/A;   IR CT HEAD LTD  01/30/2021   IR PERCUTANEOUS ART THROMBECTOMY/INFUSION INTRACRANIAL INC DIAG ANGIO  01/30/2021   LEFT HEART CATH AND CORONARY ANGIOGRAPHY N/A 01/28/2021   Procedure: LEFT HEART CATH AND CORONARY ANGIOGRAPHY;  Surgeon: Nelva Bush, MD;  Location: [place] CV LAB;  Service: Cardiovascular;  Laterality: N/A;   RADIOLOGY WITH ANESTHESIA N/A 01/30/2021   Procedure: IR WITH ANESTHESIA;  Surgeon: Luanne Bras, MD;  Location: [place];  Service: Radiology;  Laterality: N/A;   TONSILLECTOMY AND ADENOIDECTOMY     TRANSCAROTID ARTERY REVASCULARIZATION  Left 02/07/2021   Procedure: TRANSCAROTID ARTERY REVASCULARIZATION LEFT;  Surgeon: [name], MD;  Location: [facility] OR;  Service: Vascular;  Laterality: Left;   Social History:  reports that he quit smoking about 18 years ago. His smoking use included cigarettes. He has a 80.00 pack-year smoking history. He has quit using smokeless tobacco.  His smokeless tobacco use included chew. He reports previous alcohol use of about 3.0 standard drinks of alcohol per week. He reports that he does not use drugs.  Allergies  Allergen Reactions   Crestor [Rosuvastatin]  Cramps. (Hight dose of Crestor)   Livalo [Pitavastatin]     myalgia   Pravachol [Pravastatin Sodium]     Cramps   Zocor [Simvastatin]     Cramps   Family History  Problem Relation Age of Onset   Cancer Mother 77       stomach   Heart attack Father    Hypertension Sister    Hypertension Brother    Family history: Family history reviewed and not pertinent  Prior to Admission medications   Medication Sig Start Date End Date Taking? Authorizing Provider  amiodarone (PACERONE) 200 MG tablet Take 1 tablet (200 mg total) by mouth daily. 02/26/21   Rinehuls, David L, PA-C  amiodarone (PACERONE) 400 MG tablet Take 1 tablet (400 mg total) by mouth daily for 14 days. 02/12/21 02/26/21  Rinehuls,  David L, PA-C  amiodarone (PACERONE) 400 MG tablet Take 1 tablet (400 mg total) by mouth 2 (two) times daily for 10 days. 02/02/21 02/12/21  Rinehuls, David L, PA-C  aspirin EC 81 MG tablet Take 81 mg by mouth daily. Swallow whole.    [provider]  calcium-vitamin D (OSCAL WITH D) 500-200 MG-UNIT tablet Take 1 tablet by mouth daily with breakfast.    [provider]  carvedilol (COREG) 12.5 MG tablet TAKE 1 TABLET TWICE A DAY 02/24/21   Arida, Muhammad A, MD  clopidogrel (PLAVIX) 75 MG tablet Take 1 tablet (75 mg total) by mouth daily. 02/03/21 05/04/21  Rinehuls, David L, PA-C  Coenzyme Q10 (COQ10 PO) Take by mouth.    [provider]  hydrochlorothiazide (HYDRODIURIL) 25 MG tablet Take 1 tablet (25 mg total) by mouth daily. 02/03/21   Rinehuls, David L, PA-C  lisinopril (ZESTRIL) 20 MG tablet Take 2 tablets (40 mg total) by mouth daily. 02/03/21   Rinehuls, David L, PA-C  Multiple Vitamin (MULTIVITAMIN WITH MINERALS) TABS tablet Take 1 tablet by mouth daily.    [provider]  nystatin (MYCOSTATIN) 100000 UNIT/ML suspension Use as directed 5 mLs (500,000 Units total) in the mouth or throat 4 (four) times daily. Swish and spit 02/07/21   Eveland, Matthew, PA-C  rosuvastatin (CRESTOR) 5 MG tablet TAKE 1 TABLET DAILY 02/24/21   Arida, Muhammad A, MD  vitamin C (ASCORBIC ACID) 500 MG tablet Take 500 mg by mouth daily.    [provider]   Physical Exam: Vitals:   03/16/21 1115 03/16/21 1145 03/16/21 1200 03/16/21 1230  BP:   (!) 107/55 100/87  Pulse:  63 62 (!) 58  Resp:  15 14 14  Temp:      SpO2:  99% 99% 99%  Weight: 93 kg     Height: 5' 11" (1.803 m)      Constitutional: appears age appropriate, NAD, calm, comfortable Eyes: PERRL, lids and conjunctivae normal ENMT: Mucous membranes are moist. Posterior pharynx clear of any exudate or lesions. Age-appropriate dentition. Hearing appropriate Neck: normal, supple, no masses, no thyromegaly Respiratory:  clear to auscultation bilaterally, no wheezing, no crackles. Normal respiratory effort. No accessory muscle use.  Cardiovascular: Regular rate and rhythm, no murmurs / rubs / gallops. No extremity edema. 2+ pedal pulses. No carotid bruits.  Abdomen: no tenderness, no masses palpated, no hepatosplenomegaly. Bowel sounds positive.  Musculoskeletal: no clubbing / cyanosis. No joint deformity upper and lower extremities. Good ROM, no contractures, no atrophy. Normal muscle tone.  Skin: no rashes, lesions, ulcers. No induration, bilateral lower extremity dry skin Neurologic: Sensation intact. Strength 5/5 in all 4.    Psychiatric: Normal judgment and insight. Alert and oriented x 3. Normal mood.   EKG: independently reviewed, showing sinus rhythm with a rate of 66, QTc 493  Chest x-ray on Admission: not indicated at this time  Labs on Admission: I have personally reviewed following labs  CBC: Recent Labs  Lab 03/16/21 1135  WBC 6.9  NEUTROABS 5.1  HGB 9.4*  HCT 27.7*  MCV 98.6  PLT 308   Basic Metabolic Panel: Recent Labs  Lab 03/16/21 1135  NA 138  K 3.3*  CL 104  CO2 27  GLUCOSE 149*  BUN 40*  CREATININE 1.46*  CALCIUM 9.1   GFR: Estimated Creatinine Clearance: 53.3 mL/min (A) (by C-G formula based on SCr of 1.46 mg/dL (H)).  Liver Function Tests: Recent Labs  Lab 03/16/21 1135  AST 23  ALT 16  ALKPHOS 76  BILITOT 0.6  PROT 6.3*  ALBUMIN 4.0   Recent Labs  Lab 03/16/21 1135  LIPASE 25   Coagulation Profile: Recent Labs  Lab 03/16/21 1135  INR 1.0   Urine analysis:    Component Value Date/Time   COLORURINE YELLOW 02/07/2021 0757   APPEARANCEUR HAZY (A) 02/07/2021 0757   LABSPEC 1.019 02/07/2021 0757   PHURINE 6.0 02/07/2021 0757   GLUCOSEU NEGATIVE 02/07/2021 0757   HGBUR NEGATIVE 02/07/2021 0757   BILIRUBINUR NEGATIVE 02/07/2021 0757   KETONESUR NEGATIVE 02/07/2021 0757   PROTEINUR NEGATIVE 02/07/2021 0757   NITRITE NEGATIVE 02/07/2021 0757    LEUKOCYTESUR NEGATIVE 02/07/2021 0757   Alassane Kalafut N Naseem Varden D.O. [facility]  If 7PM-7AM, please contact overnight-coverage provider If 7AM-7PM, please contact day coverage provider [email or web address]  03/16/2021, 2:38 PM

## 2021-03-17 ENCOUNTER — Telehealth: Payer: Self-pay

## 2021-03-17 DIAGNOSIS — I6389 Other cerebral infarction: Secondary | ICD-10-CM | POA: Diagnosis not present

## 2021-03-17 DIAGNOSIS — K922 Gastrointestinal hemorrhage, unspecified: Secondary | ICD-10-CM

## 2021-03-17 DIAGNOSIS — Z8616 Personal history of COVID-19: Secondary | ICD-10-CM | POA: Diagnosis not present

## 2021-03-17 DIAGNOSIS — I6522 Occlusion and stenosis of left carotid artery: Secondary | ICD-10-CM | POA: Diagnosis present

## 2021-03-17 DIAGNOSIS — Z01810 Encounter for preprocedural cardiovascular examination: Secondary | ICD-10-CM

## 2021-03-17 DIAGNOSIS — K269 Duodenal ulcer, unspecified as acute or chronic, without hemorrhage or perforation: Secondary | ICD-10-CM | POA: Diagnosis not present

## 2021-03-17 DIAGNOSIS — K635 Polyp of colon: Secondary | ICD-10-CM | POA: Diagnosis not present

## 2021-03-17 DIAGNOSIS — I714 Abdominal aortic aneurysm, without rupture: Secondary | ICD-10-CM

## 2021-03-17 DIAGNOSIS — I472 Ventricular tachycardia: Secondary | ICD-10-CM

## 2021-03-17 DIAGNOSIS — E1122 Type 2 diabetes mellitus with diabetic chronic kidney disease: Secondary | ICD-10-CM | POA: Diagnosis present

## 2021-03-17 DIAGNOSIS — I5022 Chronic systolic (congestive) heart failure: Secondary | ICD-10-CM | POA: Diagnosis not present

## 2021-03-17 DIAGNOSIS — E7800 Pure hypercholesterolemia, unspecified: Secondary | ICD-10-CM | POA: Diagnosis present

## 2021-03-17 DIAGNOSIS — I252 Old myocardial infarction: Secondary | ICD-10-CM | POA: Diagnosis not present

## 2021-03-17 DIAGNOSIS — I2510 Atherosclerotic heart disease of native coronary artery without angina pectoris: Secondary | ICD-10-CM

## 2021-03-17 DIAGNOSIS — Z955 Presence of coronary angioplasty implant and graft: Secondary | ICD-10-CM | POA: Diagnosis not present

## 2021-03-17 DIAGNOSIS — I69320 Aphasia following cerebral infarction: Secondary | ICD-10-CM | POA: Diagnosis not present

## 2021-03-17 DIAGNOSIS — N183 Chronic kidney disease, stage 3 unspecified: Secondary | ICD-10-CM | POA: Diagnosis present

## 2021-03-17 DIAGNOSIS — Z28310 Unvaccinated for covid-19: Secondary | ICD-10-CM | POA: Diagnosis not present

## 2021-03-17 DIAGNOSIS — D62 Acute posthemorrhagic anemia: Secondary | ICD-10-CM | POA: Diagnosis present

## 2021-03-17 DIAGNOSIS — I6529 Occlusion and stenosis of unspecified carotid artery: Secondary | ICD-10-CM | POA: Diagnosis not present

## 2021-03-17 DIAGNOSIS — I5042 Chronic combined systolic (congestive) and diastolic (congestive) heart failure: Secondary | ICD-10-CM | POA: Diagnosis present

## 2021-03-17 DIAGNOSIS — E876 Hypokalemia: Secondary | ICD-10-CM | POA: Diagnosis present

## 2021-03-17 DIAGNOSIS — N179 Acute kidney failure, unspecified: Secondary | ICD-10-CM | POA: Diagnosis present

## 2021-03-17 DIAGNOSIS — E785 Hyperlipidemia, unspecified: Secondary | ICD-10-CM | POA: Diagnosis present

## 2021-03-17 DIAGNOSIS — Z7902 Long term (current) use of antithrombotics/antiplatelets: Secondary | ICD-10-CM | POA: Diagnosis not present

## 2021-03-17 DIAGNOSIS — K921 Melena: Secondary | ICD-10-CM | POA: Diagnosis present

## 2021-03-17 DIAGNOSIS — R42 Dizziness and giddiness: Secondary | ICD-10-CM | POA: Diagnosis present

## 2021-03-17 DIAGNOSIS — Z7982 Long term (current) use of aspirin: Secondary | ICD-10-CM | POA: Diagnosis not present

## 2021-03-17 DIAGNOSIS — I130 Hypertensive heart and chronic kidney disease with heart failure and stage 1 through stage 4 chronic kidney disease, or unspecified chronic kidney disease: Secondary | ICD-10-CM | POA: Diagnosis present

## 2021-03-17 DIAGNOSIS — K257 Chronic gastric ulcer without hemorrhage or perforation: Secondary | ICD-10-CM | POA: Diagnosis present

## 2021-03-17 DIAGNOSIS — Z79899 Other long term (current) drug therapy: Secondary | ICD-10-CM | POA: Diagnosis not present

## 2021-03-17 DIAGNOSIS — I25118 Atherosclerotic heart disease of native coronary artery with other forms of angina pectoris: Secondary | ICD-10-CM | POA: Diagnosis not present

## 2021-03-17 DIAGNOSIS — Z87891 Personal history of nicotine dependence: Secondary | ICD-10-CM | POA: Diagnosis not present

## 2021-03-17 LAB — CBC
HCT: 26.3 % — ABNORMAL LOW (ref 39.0–52.0)
Hemoglobin: 8.8 g/dL — ABNORMAL LOW (ref 13.0–17.0)
MCH: 32.8 pg (ref 26.0–34.0)
MCHC: 33.5 g/dL (ref 30.0–36.0)
MCV: 98.1 fL (ref 80.0–100.0)
Platelets: 210 10*3/uL (ref 150–400)
RBC: 2.68 MIL/uL — ABNORMAL LOW (ref 4.22–5.81)
RDW: 14.6 % (ref 11.5–15.5)
WBC: 6.5 10*3/uL (ref 4.0–10.5)
nRBC: 0 % (ref 0.0–0.2)

## 2021-03-17 LAB — BASIC METABOLIC PANEL
Anion gap: 4 — ABNORMAL LOW (ref 5–15)
BUN: 23 mg/dL (ref 8–23)
CO2: 29 mmol/L (ref 22–32)
Calcium: 8.7 mg/dL — ABNORMAL LOW (ref 8.9–10.3)
Chloride: 108 mmol/L (ref 98–111)
Creatinine, Ser: 1.14 mg/dL (ref 0.61–1.24)
GFR, Estimated: >60 mL/min (ref >60)
Glucose, Bld: 100 mg/dL — ABNORMAL HIGH (ref 70–99)
Potassium: 3.3 mmol/L — ABNORMAL LOW (ref 3.5–5.1)
Sodium: 141 mmol/L (ref 135–145)

## 2021-03-17 LAB — IRON AND TIBC
Iron: 51 ug/dL (ref 45–182)
Saturation Ratios: 21 % (ref 17.9–39.5)
TIBC: 248 ug/dL — ABNORMAL LOW (ref 250–450)
UIBC: 197 ug/dL

## 2021-03-17 LAB — FERRITIN: Ferritin: 140 ng/mL (ref 24–336)

## 2021-03-17 LAB — MAGNESIUM: Magnesium: 2 mg/dL (ref 1.7–2.4)

## 2021-03-17 LAB — FOLATE: Folate: 25 ng/mL (ref >5.9)

## 2021-03-17 MED ORDER — POTASSIUM CHLORIDE CRYS ER 20 MEQ PO TBCR
40.0000 meq | EXTENDED_RELEASE_TABLET | Freq: Once | ORAL | Status: AC
  Administered 2021-03-17: 40 meq via ORAL
  Filled 2021-03-17: qty 2

## 2021-03-17 NOTE — Telephone Encounter (Signed)
Monitor report received showing sinus rhythm with 1st degree AV block and 11 beat run of V-tach on 03/15/21 at 5:50pm CST. Patient is currently hospitalized.

## 2021-03-17 NOTE — Progress Notes (Signed)
Initial Nutrition Assessment  DOCUMENTATION CODES:   Not applicable  INTERVENTION:   -Boost Breeze po TID, each supplement provides 250 kcal and 9 grams of protein  -30 ml Prosource Plus TID, each supplement provides 100 kcals and 15 grams protein -MVI with minerals daily  NUTRITION DIAGNOSIS:   Inadequate oral intake related to altered GI function as evidenced by NPO status (clear liquid diet).  GOAL:   Patient will meet greater than or equal to 90% of their needs  MONITOR:   PO intake, Supplement acceptance, Diet advancement, Labs, Weight trends, Skin, I & O's  REASON FOR ASSESSMENT:   Malnutrition Screening Tool    ASSESSMENT:   Garrett Hunter is a 75 y.o. male with medical history significant for gastric ulcer s/p cauterization, cad s/p pci, left MCA 01/30/2021 status post unsuccessful thrombectomy and received tPA, left carotid stenosis status post left TCAR, was recommended by neurology on 02/02/2021 that he remain on aspirin and Plavix for 3 months, history of wide-complex ventricular tachycardia, on amiodarone, hypertension, hyperlipidemia, presents to the emergency department for chief concerns of melena stool.  Pt admitted with melena and suspected GIB.   Reviewed I/O's: -494 ml x 24 hours  UOP: 1.5 L x 24 hours   Pt unavailable at time of visit. Attempted to speak with pt via call to hospital room phone, however, unable to reach. RD unable to obtain further nutrition-related history or complete nutrition-focused physical exam at this time.    Pt just advanced to a clear liquid diet; no meal completion data currently available to assess at this time.   Reviewed wt hx; pt has experienced a 10% wt loss over the past month, which is significant for time frame.   Pt is at high risk for malnutrition, however, unable to identify at this time. Pt would greatly benefit from addition of oral nutrition supplements.   Medications reviewed.   Labs reviewed: K: 3.3.     Diet Order:   Diet Order             Diet clear liquid Room service appropriate? Yes; Fluid consistency: Thin  Diet effective now                   EDUCATION NEEDS:   No education needs have been identified at this time  Skin:  Skin Assessment: Reviewed RN Assessment  Last BM:  Unknown  Height:   Ht Readings from Last 1 Encounters:  03/16/21 5\' 11"  (1.803 m)    Weight:   Wt Readings from Last 1 Encounters:  03/16/21 93 kg    Ideal Body Weight:  78.2 kg  BMI:  Body mass index is 28.59 kg/m.  Estimated Nutritional Needs:   Kcal:  2050-2250  Protein:  95-110 grams  Fluid:  > 2 L    Loistine Chance, RD, LDN, [place] Registered Dietitian II Certified Diabetes Care and Education Specialist Please refer to [facility] for RD and/or RD on-call/weekend/after hours pager

## 2021-03-17 NOTE — Progress Notes (Signed)
Garrett Darby, MD [address]  [place]  [place], [place] [address]  Main: [phone]  Fax: [phone] Pager: [phone]   Subjective: Patient did not have any further episodes of melena today.  He is tolerating clear liquids well.  His hemoglobin is 8.8 today.  Plavix has been held.   Objective: Vital signs in last 24 hours: Vitals:   03/17/21 0419 03/17/21 0940 03/17/21 1107 03/17/21 1553  BP: 136/67 127/74 135/72 (!) 144/80  Pulse: 60 60 62 62  Resp: 16 18 16 16   Temp: 97.9 F (36.6 C) (!) 97.4 F (36.3 C) 98.6 F (37 C) 97.8 F (36.6 C)  TempSrc: Oral  Oral Oral  SpO2: 100% 99% 99% 100%  Weight:      Height:       Weight change:   Intake/Output Summary (Last 24 hours) at 03/17/2021 1853 Last data filed at 03/17/2021 1841 Gross per 24 hour  Intake 2603.52 ml  Output 2100 ml  Net 503.52 ml     Exam: Heart:: Regular rate and rhythm, S1S2 present, or without murmur or extra heart sounds Lungs: clear to auscultation Abdomen: soft, nontender, normal bowel sounds   Lab Results: CBC Latest Ref Rng & Units 03/17/2021 03/16/2021 02/08/2021  WBC 4.0 - 10.5 K/uL 6.5 6.9 14.5(H)  Hemoglobin 13.0 - 17.0 g/dL 8.8(L) 9.4(L) 11.6(L)  Hematocrit 39.0 - 52.0 % 26.3(L) 27.7(L) 34.2(L)  Platelets 150 - 400 K/uL 210 242 281   CMP Latest Ref Rng & Units 03/17/2021 03/16/2021 02/08/2021  Glucose 70 - 99 mg/dL 100(H) 149(H) 138(H)  BUN 8 - 23 mg/dL 23 40(H) 23  Creatinine 0.61 - 1.24 mg/dL 1.14 1.46(H) 1.22  Sodium 135 - 145 mmol/L 141 138 136  Potassium 3.5 - 5.1 mmol/L 3.3(L) 3.3(L) 3.6  Chloride 98 - 111 mmol/L 108 104 99  CO2 22 - 32 mmol/L 29 27 27   Calcium 8.9 - 10.3 mg/dL 8.7(L) 9.1 9.6  Total Protein 6.5 - 8.1 g/dL - 6.3(L) -  Total Bilirubin 0.3 - 1.2 mg/dL - 0.6 -  Alkaline Phos 38 - 126 U/L - 76 -  AST 15 - 41 U/L - 23 -  ALT 0 - 44 U/L - 16 -    Micro Results: Recent Results (from the past 240 hour(s))  Resp Panel by RT-PCR (Flu A&B, Covid)  Nasopharyngeal Swab     Status: None   Collection Time: 03/16/21  1:24 PM   Specimen: Nasopharyngeal Swab; Nasopharyngeal(NP) swabs in vial transport medium  Result Value Ref Range Status   SARS Coronavirus 2 by RT PCR NEGATIVE NEGATIVE Final    Comment: (NOTE) SARS-CoV-2 target nucleic acids are NOT DETECTED.  The SARS-CoV-2 RNA is generally detectable in upper respiratory specimens during the acute phase of infection. The lowest concentration of SARS-CoV-2 viral copies this assay can detect is 138 copies/mL. A negative result does not preclude SARS-Cov-2 infection and should not be used as the sole basis for treatment or other patient management decisions. A negative result may occur with  improper specimen collection/handling, submission of specimen other than nasopharyngeal swab, presence of viral mutation(s) within the areas targeted by this assay, and inadequate number of viral copies(<138 copies/mL). A negative result must be combined with clinical observations, patient history, and epidemiological information. The expected result is Negative.  Fact Sheet for Patients:  EntrepreneurPulse.com.au  Fact Sheet for Healthcare Providers:  IncredibleEmployment.be  This test is no t yet approved or cleared by the [place] and  has been authorized for detection and/or diagnosis of SARS-CoV-2 by FDA under an Emergency Use Authorization (EUA). This EUA will remain  in effect (meaning this test can be used) for the duration of the COVID-19 declaration under Section 564(b)(1) of the Act, 21 U.S.C.section 360bbb-3(b)(1), unless the authorization is terminated  or revoked sooner.       Influenza A by PCR NEGATIVE NEGATIVE Final   Influenza B by PCR NEGATIVE NEGATIVE Final    Comment: (NOTE) The Xpert Xpress SARS-CoV-2/FLU/RSV plus assay is intended as an aid in the diagnosis of influenza from Nasopharyngeal swab specimens and should not be  used as a sole basis for treatment. Nasal washings and aspirates are unacceptable for Xpert Xpress SARS-CoV-2/FLU/RSV testing.  Fact Sheet for Patients: EntrepreneurPulse.com.au  Fact Sheet for Healthcare Providers: IncredibleEmployment.be  This test is not yet approved or cleared by the [place] FDA and has been authorized for detection and/or diagnosis of SARS-CoV-2 by FDA under an Emergency Use Authorization (EUA). This EUA will remain in effect (meaning this test can be used) for the duration of the COVID-19 declaration under Section 564(b)(1) of the Act, 21 U.S.C. section 360bbb-3(b)(1), unless the authorization is terminated or revoked.  Performed at [facility], [address] [place], [place] [address]    Studies/Results: No results found. Medications: I have reviewed the patient's current medications. Prior to Admission:  Medications Prior to Admission  Medication Sig Dispense Refill Last Dose   amiodarone (PACERONE) 200 MG tablet Take 1 tablet (200 mg total) by mouth daily. (Patient taking differently: Take 200 mg by mouth at bedtime.) 30 tablet 0 03/15/2021 at 2100   aspirin EC 81 MG tablet Take 81 mg by mouth daily. Swallow whole.      carvedilol (COREG) 12.5 MG tablet TAKE 1 TABLET TWICE A DAY (Patient taking differently: Take 12.5 mg by mouth 2 (two) times daily.) 180 tablet 1 03/16/2021 at 0700   clopidogrel (PLAVIX) 75 MG tablet Take 1 tablet (75 mg total) by mouth daily. 30 tablet 2 03/16/2021 at 0700   Coenzyme Q10 (COQ10) 100 MG CAPS Take 1 capsule by mouth every evening.      lisinopril-hydrochlorothiazide (ZESTORETIC) 20-12.5 MG tablet Take 2 tablets by mouth daily.   03/16/2021 at 0700   Multiple Vitamin (MULTIVITAMIN WITH MINERALS) TABS tablet Take 1 tablet by mouth daily.      rosuvastatin (CRESTOR) 5 MG tablet TAKE 1 TABLET DAILY (Patient taking differently: Take 5 mg by mouth at bedtime.) 90 tablet 1  03/15/2021 at 2100   vitamin C (ASCORBIC ACID) 500 MG tablet Take 500 mg by mouth daily.      hydrochlorothiazide (HYDRODIURIL) 25 MG tablet Take 1 tablet (25 mg total) by mouth daily. (Patient not taking: No sig reported) 30 tablet 1 Not Taking   lisinopril (ZESTRIL) 20 MG tablet Take 2 tablets (40 mg total) by mouth daily. (Patient not taking: No sig reported) 30 tablet 1 Not Taking   Scheduled:  amiodarone  200 mg Oral QHS   carvedilol  12.5 mg Oral BID   [START ON 03/19/2021] pantoprazole  40 mg Intravenous Q12H   rosuvastatin  5 mg Oral QHS   Continuous:  pantoprozole (PROTONIX) infusion 8 mg/hr (03/17/21 1018)   GUY:[id number] **OR** acetaminophen, ondansetron **OR** ondansetron (ZOFRAN) IV Anti-infectives (From admission, onward)    None      Scheduled Meds:  amiodarone  200 mg Oral QHS   carvedilol  12.5 mg Oral BID   [START ON 03/19/2021] pantoprazole  40 mg Intravenous Q12H   rosuvastatin  5 mg Oral QHS   Continuous Infusions:  pantoprozole (PROTONIX) infusion 8 mg/hr (03/17/21 1018)   PRN Meds:.acetaminophen **OR** acetaminophen, ondansetron **OR** ondansetron (ZOFRAN) IV   Assessment: Principal Problem:   Upper GI bleeding Active Problems:   Coronary artery disease   Hyperlipidemia   AAA (abdominal aortic aneurysm) (HCC)   Chronic systolic heart failure (HCC)   Erectile dysfunction   GIB (gastrointestinal bleeding)   Primary hypertension   Pure hypercholesterolemia   Peptic ulcer disease   Duodenal ulcer disease   Sustained VT (ventricular tachycardia) (HCC)   AKI (acute kidney injury) (HCC)   Carotid stenosis   CKD (chronic kidney disease) stage 3, GFR 30-59 ml/min (HCC)   Hypokalemia   Melena  Acute blood loss anemia, secondary to melena, elevated BUN/creatinine  Plan: Continue pantoprazole drip BUN and creatinine have normalized, no active bleeding at this time Okay with full liquid diet Tentative plan for upper endoscopy on 6/15, last dose  of Plavix was on 6/12 AM Also, I have discussed with patient that if upper endoscopy is negative, I recommend colonoscopy +/- VCE and patient is agreeable Monitor CBC daily, check iron studies, B12 and folate levels Maintain hemoglobin above 8 OOB to chair and ambulate as tolerated  I have discussed alternative options, risks & benefits,  which include, but are not limited to, bleeding, infection, perforation,respiratory complication & drug reaction.  The patient agrees with this plan & written consent will be obtained.       LOS: 0 days   Garrett Hunter 03/17/2021, 6:53 PM

## 2021-03-17 NOTE — Progress Notes (Addendum)
[facility] NAME: Garrett Hunter    MR#:  [id number]  DATE OF BIRTH:  [birth year]  SUBJECTIVE:  CHIEF COMPLAINT:   Chief Complaint  Patient presents with   Rectal Bleeding   Dizziness  Denies any further bowel movement while in the hospital.  Would like to have some diet as he is hungry REVIEW OF SYSTEMS:  Review of Systems  Constitutional:  Negative for diaphoresis, fever, malaise/fatigue and weight loss.  HENT:  Negative for ear discharge, ear pain, hearing loss, nosebleeds, sore throat and tinnitus.   Eyes:  Negative for blurred vision and pain.  Respiratory:  Negative for cough, hemoptysis, shortness of breath and wheezing.   Cardiovascular:  Negative for chest pain, palpitations, orthopnea and leg swelling.  Gastrointestinal:  Positive for melena. Negative for abdominal pain, blood in stool, constipation, diarrhea, heartburn, nausea and vomiting.  Genitourinary:  Negative for dysuria, frequency and urgency.  Musculoskeletal:  Negative for back pain and myalgias.  Skin:  Negative for itching and rash.  Neurological:  Positive for dizziness. Negative for tingling, tremors, focal weakness, seizures, weakness and headaches.  Psychiatric/Behavioral:  Negative for depression. The patient is not nervous/anxious.   DRUG ALLERGIES:   Allergies  Allergen Reactions   Crestor [Rosuvastatin]     Cramps. (Hight dose of Crestor)   Livalo [Pitavastatin]     myalgia   Pravachol [Pravastatin Sodium]     Cramps   Zocor [Simvastatin]     Cramps   VITALS:  Blood pressure 135/72, pulse 62, temperature 98.6 F (37 C), temperature source Oral, resp. rate 16, height 5\' 11"  (1.803 m), weight 93 kg, SpO2 99 %. PHYSICAL EXAMINATION:  Physical Exam 75 year old male lying in the bed comfortably without any acute distress Lungs clear to auscultation bilaterally, no wheezing rales rhonchi crepitation Cardiovascular S1-S2 normal, no murmur rales or  gallop Abdomen soft, benign Neuro alert and oriented, nonfocal exam Skin no rash or lesion Psych normal mood and affect LABORATORY PANEL:  Male CBC Recent Labs  Lab 03/17/21 0404  WBC 6.5  HGB 8.8*  HCT 26.3*  PLT 210   ------------------------------------------------------------------------------------------------------------------ Chemistries  Recent Labs  Lab 03/16/21 1135 03/17/21 0404  NA 138 141  K 3.3* 3.3*  CL 104 108  CO2 27 29  GLUCOSE 149* 100*  BUN 40* 23  CREATININE 1.46* 1.14  CALCIUM 9.1 8.7*  MG 2.1 2.0  AST 23  --   ALT 16  --   ALKPHOS 76  --   BILITOT 0.6  --    RADIOLOGY:  No results found. ASSESSMENT AND PLAN:  75 y.o. male with medical history significant for gastric ulcer s/p cauterization, cad s/p pci, left MCA 01/30/2021 status post unsuccessful thrombectomy and received tPA, left carotid stenosis status post left TCAR, was recommended by neurology on 02/02/2021 that he remain on aspirin and Plavix for 3 months, history of wide-complex ventricular tachycardia, on amiodarone, hypertension, hyperlipidemia admitted for melena  # Melena stool-suspect upper GI bleed - GI Dr. Marius Ditch aware.  May consider EGD tomorrow once off Plavix for 48 hours.  Last dose of Plavix 6/12 morning at home - Continue Protonix drip -Hold Plavix and aspirin for now - Clear liquid diet for today.  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 Curly Shores has been consulted via secure chat and is okay with holding aspirin and Plavix for now and recommended hospitalist team to consult vascular   # Left carotid stenosis-status post TCAR on 02/07/2021 - Vascular, Dr. Feliberto Gottron has been consulted for recommendations Plavix and aspirin and agrees that plavix and asa should be held at this time   # Hypokalemia-suspect secondary to hydrochlorothiazide use - replaced with potassium chloride 40 mill equivalent p.o., once - Check magnesium level as an add-on lab - BMP in the a.m.   # Left upper lobe nodule that is 4 mm-outpatient follow-up   # History of wide-complex ventricular tachycardia-status post cardioversion, resumed amiodarone 200 mg p.o. daily - Continue Coreg 12.5 mg twice daily and Crestor -CST reported sinus rhythm with first-degree AV block and 11 beats run of V. tach on 03/15/2021 at 5:50 PM -Consult cardiology   #Essential hypertension-patient takes hydrochlorothiazide 25 mg daily, lisinopril 20 mg daily, carvedilol 12.5 mg twice daily at home - Hold hydrochlorothiazide and lisinopril at this time as patient is low normotensive and acute kidney injury - Continue carvedilol 12.5 mg BID     # Hyperlipidemia-rosuvastatin 5 mg nightly resumed   # History of duodenal and gastric ulcer-GI would like to keep him on clear liquid today, possible EGD tomorrow   # History of COVID-19 infection-December 2021   # COVID PCR/influenza A/influenza B result-negative  Body mass index is 28.59 kg/m.  Net IO Since Admission: -406.48 mL [03/17/21 1334]      Status is: Observation  The patient remains OBS appropriate and will d/c before 2 midnights.  Dispo: The patient is from: Home              Anticipated d/c is to: Home              Patient currently is not medically stable to d/c.   Difficult to place patient No       DVT prophylaxis:       Place TED hose Start: 03/16/21 1251     Family  Communication:  Tried calling wife and daughter at listed # but no answer on 6/13   All the records are reviewed and case discussed with Care Management/Social Worker. Management plans discussed with the patient, nursing and they are in agreement.  CODE STATUS: Full Code Level of care: Med-Surg  TOTAL TIME TAKING CARE OF THIS PATIENT: 25 minutes.   More than 50% of the time was spent in counseling/coordination of care: YES  POSSIBLE D/C IN 2-3 DAYS, DEPENDING ON CLINICAL CONDITION.  And GI eval   Max Sane M.D on 03/17/2021 at 1:34 PM  [facility]   CC: Primary care physician; Derinda Late, MD  Note: This dictation was prepared with Dragon dictation along with smaller phrase technology. Any transcriptional errors that result from this process are unintentional.

## 2021-03-17 NOTE — Progress Notes (Signed)
   03/17/21 1215  Clinical Encounter Type  Visited With Patient  Visit Type Initial;Spiritual support;Social support  Referral From Chaplain  Consult/Referral To [place] had an initial visit with PT while doing rounds. Chaplain gave space for PT to express his emotions. PT spoke of various health challenges since he had a stroke, approximately 4 weeks ago. PT stated it has affected his speech and memory. He is now in the hospital because of his stroke medication, which has triggered an ulcer. Chaplain ministered with presence, and reflective listening. Chaplain made him aware of Chaplaincy services.

## 2021-03-17 NOTE — Consult Note (Signed)
Cardiology Consultation:   Patient ID: Garrett Hunter; [id number]; April 13, 1946   Admit date: 03/16/2021 Date of Consult: 03/17/2021  Primary Care Provider: Derinda Late, MD Primary Cardiologist: Fletcher Anon Primary Electrophysiologist:  Curt Bears   Patient Profile:   Garrett Hunter is a 75 y.o. 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 Manuella Ghazi.  History of Present Illness:   Mr. Blackshire was recently admitted in 01/2021 with sustained VT requiring emergent shock in the ED complicated by transient respiratory suppression that was treated with flumazenil. He underwent LHC in 01/28/2021 that showed severe 2-vessel CAD with known CTO of the LCx and RCA with a patent ramus stent and minimal luminal irregularities of the LAD. It was felt his VT was scar-mediated. Echo showed an EF of 45-50%, global HK, AK of the basal inferior segment, Gr2DD, normal RVSF and ventricular cavity size, mildly dilated left atrium, trivial MR, and a mildly dilated aortic root. He was transferred to [facility], and evaluated by EP with recommendation to manage medically with amiodarone and Coreg. Outpatient cardiac monitor was recommended. While he was admitted, he suffered a left MCA CVA and received tPA. CT angio showed left MCA M2 occlusion. Thrombectomy was unsuccessful.  Further stroke work-up with CT angiogram showed a 75% stenosis of the origin of the left ICA. He underwent left TCAR by vascular surgery. It was recommended he take DAPT with ASA and Plavix.  He presented to [facility] on  6/12 with melena x 3 days with associated increased fatigue, weakness and blurry vision. No chest pain, dyspnea, or palpitations. Last dose of Plavix on the morning of 6/12. Vitals in the ED showed a BP of 100/87, HR 61 bpm, and oxygen saturation of 100% on room air. Labs showed a HGB of 9.4 trending to 8.8 (prior 11.6 with a baseline ~ 12-14), PLT 242. He was started on a Protonix gtt and GI was consulted.   Cardiology asked to evaluate the patient given outpatient cardiac monitoring showed a 11 beat run of NSVT on 03/15/2021. He was asymptomatic.     Past Medical History:  Diagnosis Date   AAA (abdominal aortic aneurysm) (HCC)    thoracoabdominal aortic aneursym, s/p right ilio-femoral bypass 10/12/16; 3V FEVAR with SMA, RRA, and LRA stenting 11/16/16 by Dr. Sammuel Hines [facility])   Benign neoplasm of colon    patient not sure   CHF (congestive heart failure) (HCC)    EF 45-50%   Chronic airway obstruction, not elsewhere classified    pt denies   Coronary artery disease    Inferior MI in 1996 treated with TPA. Cardiac cath in 2011 at [facility] showed chronic occlusion of the proximal RCA and proximal left circumflex without obstructive disease in the LAD. Non-ST elevation myocardial infarction in October 2013 while on vacation in [place]. Cardiac catheterization showed plaque rupture in the proximal ramus with thrombus. He had balloon angioplasty done. 01/28/21 cath    Coronary atherosclerosis of native coronary artery    Hepatitis A    teenager, no longer a problem  Hyperlipidemia    Intolerance to statins.   Hypertension    MI (myocardial infarction) ([place])    x 3 - last one 2013   Renal artery stenosis (HCC)    s/p bilateral renal artery angioplasty 07/26/18   Stroke ([place]) 01/2021   Ventricular tachycardia ([place])    s/p cardioverson 01/27/21    Past Surgical History:  Procedure Laterality Date   ABDOMINAL AORTIC ANEURYSM REPAIR     11/16/2016   ANGIOPLASTY     CARDIAC CATHETERIZATION  2011    CARDIAC CATHETERIZATION  2/14   [facility]; 95% lesion in the ramus intermedius.    CATARACT EXTRACTION W/PHACO Left 12/19/2018   Procedure: CATARACT EXTRACTION PHACO AND INTRAOCULAR LENS PLACEMENT ([place])  LEFT;  Surgeon: Eulogio Bear, MD;  Location: [place];  Service: Ophthalmology;  Laterality: Left;   CATARACT EXTRACTION W/PHACO Right 03/06/2019   Procedure: CATARACT EXTRACTION PHACO AND INTRAOCULAR LENS PLACEMENT (I[facility])RIGHT;  Surgeon: Eulogio Bear, MD;  Location: [place];  Service: Ophthalmology;  Laterality: Right;   CORONARY ANGIOPLASTY  1996   [facility] x1   CORONARY ANGIOPLASTY  2/14   Severe restenosis in the ostial ramus. Status post angioplasty and drug-eluting stent placement with a 2.5 x 18 mm Xience drug-eluting stent   ESOPHAGOGASTRODUODENOSCOPY N/A 08/03/2018   Procedure: ESOPHAGOGASTRODUODENOSCOPY (EGD);  Surgeon: Lin Landsman, MD;  Location: [facility] ENDOSCOPY;  Service: Gastroenterology;  Laterality: N/A;   ESOPHAGOGASTRODUODENOSCOPY (EGD) WITH PROPOFOL N/A 11/03/2018   Procedure: ESOPHAGOGASTRODUODENOSCOPY (EGD) WITH PROPOFOL;  Surgeon: Lin Landsman, MD;  Location: [facility] ENDOSCOPY;  Service: Gastroenterology;  Laterality: N/A;   ESOPHAGOGASTRODUODENOSCOPY (EGD) WITH PROPOFOL N/A 12/06/2018   Procedure: ESOPHAGOGASTRODUODENOSCOPY (EGD) WITH PROPOFOL;  Surgeon: Lucilla Lame, MD;  Location: [facility] ENDOSCOPY;  Service: Endoscopy;  Laterality: N/A;   IR CT HEAD LTD  01/30/2021   IR PERCUTANEOUS ART THROMBECTOMY/INFUSION INTRACRANIAL INC DIAG ANGIO  01/30/2021   LEFT HEART CATH AND CORONARY ANGIOGRAPHY N/A 01/28/2021   Procedure: LEFT HEART CATH AND CORONARY ANGIOGRAPHY;  Surgeon: Nelva Bush, MD;  Location: [place] CV LAB;  Service: Cardiovascular;  Laterality: N/A;   RADIOLOGY WITH ANESTHESIA N/A 01/30/2021   Procedure: IR WITH ANESTHESIA;  Surgeon: Luanne Bras, MD;  Location: [place];  Service: Radiology;  Laterality: N/A;   TONSILLECTOMY  AND ADENOIDECTOMY     TRANSCAROTID ARTERY REVASCULARIZATION  Left 02/07/2021   Procedure: TRANSCAROTID ARTERY REVASCULARIZATION LEFT;  Surgeon: [name], MD;  Location: [facility] OR;  Service: Vascular;  Laterality: Left;     Home Meds: Prior to Admission medications   Medication Sig Start Date End Date Taking? Authorizing Provider  amiodarone (PACERONE) 200 MG tablet Take 1 tablet (200 mg total) by mouth daily. Patient taking differently: Take 200 mg by mouth at bedtime. 02/26/21  Yes Rinehuls, Early Chars, PA-C  aspirin EC 81 MG tablet Take 81 mg by mouth daily. Swallow whole.   Yes [provider]  carvedilol (COREG) 12.5 MG tablet TAKE 1 TABLET TWICE A DAY Patient taking differently: Take 12.5 mg by mouth 2 (two) times daily. 02/24/21  Yes Wellington Hampshire, MD  clopidogrel (PLAVIX) 75 MG tablet Take 1 tablet (75 mg total) by mouth daily. 02/03/21 05/04/21 Yes Rinehuls, Early Chars, PA-C  Coenzyme Q10 (COQ10) 100 MG CAPS Take 1 capsule by mouth every evening.   Yes [provider]  lisinopril-hydrochlorothiazide (ZESTORETIC) 20-12.5 MG tablet Take 2 tablets by mouth daily.   Yes [provider]  Multiple Vitamin (MULTIVITAMIN WITH MINERALS) TABS tablet  Take 1 tablet by mouth daily.   Yes [provider]  rosuvastatin (CRESTOR) 5 MG tablet TAKE 1 TABLET DAILY Patient taking differently: Take 5 mg by mouth at bedtime. 02/24/21  Yes Wellington Hampshire, MD  vitamin C (ASCORBIC ACID) 500 MG tablet Take 500 mg by mouth daily.   Yes [provider]  hydrochlorothiazide (HYDRODIURIL) 25 MG tablet Take 1 tablet (25 mg total) by mouth daily. Patient not taking: No sig reported 02/03/21   Rinehuls, David L, PA-C  lisinopril (ZESTRIL) 20 MG tablet Take 2 tablets (40 mg total) by mouth daily. Patient not taking: No sig reported 02/03/21   Rinehuls, Early Chars, PA-C    Inpatient Medications: Scheduled Meds:  amiodarone  200 mg Oral QHS   carvedilol  12.5 mg Oral BID    nystatin  5 mL Mouth/Throat QID   [START ON 03/19/2021] pantoprazole  40 mg Intravenous Q12H   rosuvastatin  5 mg Oral QHS   Continuous Infusions:  pantoprozole (PROTONIX) infusion 8 mg/hr (03/17/21 1018)   PRN Meds: acetaminophen **OR** acetaminophen, ondansetron **OR** ondansetron (ZOFRAN) IV  Allergies:   Allergies  Allergen Reactions   Crestor [Rosuvastatin]     Cramps. (Hight dose of Crestor)   Livalo [Pitavastatin]     myalgia   Pravachol [Pravastatin Sodium]     Cramps   Zocor [Simvastatin]     Cramps    Social History:   Social History   Socioeconomic History   Marital status: Married    Spouse name: Not on file   Number of children: 4   Years of education: Not on file   Highest education level: Not on file  Occupational History   Not on file  Tobacco Use   Smoking status: Former    Packs/day: 2.00    Years: 40.00    Pack years: 80.00    Types: Cigarettes    Quit date: 2004    Years since quitting: 18.4   Smokeless tobacco: Former    Types: Nurse, children's Use: Never used  Substance and Sexual Activity   Alcohol use: Not Currently    Alcohol/week: 3.0 standard drinks    Types: 3 Cans of beer per week    Comment: occassionally   Drug use: No   Sexual activity: Yes  Other Topics Concern   Not on file  Social History Narrative   Lives at home with wife, works   Investment banker, operational of Radio broadcast assistant Strain: Not on file  Food Insecurity: Not on file  Transportation Needs: Not on file  Physical Activity: Not on file  Stress: Not on file  Social Connections: Not on file  Intimate Partner Violence: Not on file     Family History:  Family History  Problem Relation Age of Onset   Cancer Mother 56       stomach   Heart attack Father    Hypertension Sister    Hypertension Brother     ROS:  Review of Systems  Constitutional:  Positive for malaise/fatigue. Negative for chills, diaphoresis, fever and weight loss.  HENT:   Negative for congestion.   Eyes:  Positive for blurred vision. Negative for discharge and redness.  Respiratory:  Negative for cough, hemoptysis, sputum production, shortness of breath and wheezing.   Cardiovascular:  Negative for chest pain, palpitations, orthopnea, claudication, leg swelling and PND.  Gastrointestinal:  Positive for melena. Negative for abdominal pain, blood in stool, constipation, diarrhea, heartburn, nausea  and vomiting.  Genitourinary:  Negative for hematuria.  Musculoskeletal:  Negative for falls and myalgias.  Skin:  Negative for rash.  Neurological:  Positive for weakness. Negative for dizziness, tingling, tremors, sensory change, speech change, focal weakness and loss of consciousness.  Endo/Heme/Allergies:  Does not bruise/bleed easily.  Psychiatric/Behavioral:  Negative for substance abuse. The patient is not nervous/anxious.   All other systems reviewed and are negative.    Physical Exam/Data:   Vitals:   03/17/21 0007 03/17/21 0419 03/17/21 0940 03/17/21 1107  BP: 130/67 136/67 127/74 135/72  Pulse: 63 60 60 62  Resp: 13 16 18 16   Temp: 98.1 F (36.7 C) 97.9 F (36.6 C) (!) 97.4 F (36.3 C) 98.6 F (37 C)  TempSrc: Oral Oral  Oral  SpO2: 99% 100% 99% 99%  Weight:      Height:        Intake/Output Summary (Last 24 hours) at 03/17/2021 1333 Last data filed at 03/17/2021 1018 Gross per 24 hour  Intake 1043.52 ml  Output 1450 ml  Net -406.48 ml   Filed Weights   03/16/21 1115  Weight: 93 kg   Body mass index is 28.59 kg/m.   Physical Exam: General: Well developed, well nourished, in no acute distress. Head: Normocephalic, atraumatic, sclera non-icteric, no xanthomas, nares without discharge.  Neck: Negative for carotid bruits. JVD not elevated. Lungs: Clear bilaterally to auscultation without wheezes, rales, or rhonchi. Breathing is unlabored. Heart: RRR with S1 S2. No murmurs, rubs, or gallops appreciated. Abdomen: Soft, non-tender,  non-distended with normoactive bowel sounds. No hepatomegaly. No rebound/guarding. No obvious abdominal masses. Msk:  Strength and tone appear normal for age. Extremities: No clubbing or cyanosis. No edema. Distal pedal pulses are 2+ and equal bilaterally. Neuro: Alert and oriented X 3. No facial asymmetry. No focal deficit. Moves all extremities spontaneously. Dysarthria.  Psych:  Responds to questions appropriately with a normal affect.   EKG:  The EKG was personally reviewed and demonstrates: NSR, 66 bpm, nonspecific IVCD, no acute st/t changes Telemetry:  Telemetry was personally reviewed and demonstrates: SR  Weights: [employer]   03/16/21 1115  Weight: 93 kg    Relevant CV Studies:  LHC 01/28/2021: Conclusions: Severe two-vessel coronary artery disease with chronic total occlusions of the proximal LCx and RCA.  There are minimal luminal irregularities in the LAD without significant stenosis. Widely patent ramus intermedius stent. Moderately elevated left ventricular filling pressure.   Recommendations: Continue IV amiodarone and ICU monitoring in the setting of sustained ventricular tachycardia that is likely scar mediated. Gentle diuresis. Aggressive secondary prevention of coronary artery disease. EP consultation for further management of VT. __________  2D echo 01/28/2021: 1. Left ventricular ejection fraction, by estimation, is 45 to 50%. The  left ventricle has mildly decreased function. The left ventricle  demonstrates global hypokinesis. There is mild left ventricular  hypertrophy. Left ventricular diastolic parameters  are consistent with Grade II diastolic dysfunction (pseudonormalization).  There is akinesis of the left ventricular, basal inferior segment.   2. Right ventricular systolic function is normal. The right ventricular  size is normal.   3. Left atrial size was mildly dilated.   4. The mitral valve is grossly normal. Trivial mitral valve   regurgitation. No evidence of mitral stenosis.   5. The aortic valve was not well visualized. Aortic valve regurgitation  is not visualized. No aortic stenosis is present.   6. Aortic dilatation noted. There is mild dilatation of the aortic root,  measuring 39  mm.   Laboratory Data:  Chemistry Recent Labs  Lab 03/16/21 1135 03/17/21 0404  NA 138 141  K 3.3* 3.3*  CL 104 108  CO2 27 29  GLUCOSE 149* 100*  BUN 40* 23  CREATININE 1.46* 1.14  CALCIUM 9.1 8.7*  GFRNONAA 51* >60  ANIONGAP 7 4*    Recent Labs  Lab 03/16/21 1135  PROT 6.3*  ALBUMIN 4.0  AST 23  ALT 16  ALKPHOS 76  BILITOT 0.6   Hematology Recent Labs  Lab 03/16/21 1135 03/17/21 0404  WBC 6.9 6.5  RBC 2.81* 2.68*  HGB 9.4* 8.8*  HCT 27.7* 26.3*  MCV 98.6 98.1  MCH 33.5 32.8  MCHC 33.9 33.5  RDW 14.5 14.6  PLT 242 210   Cardiac EnzymesNo results for input(s): TROPONINI in the last 168 hours. No results for input(s): TROPIPOC in the last 168 hours.  BNPNo results for input(s): BNP, PROBNP in the last 168 hours.  DDimer No results for input(s): DDIMER in the last 168 hours.  Radiology/Studies:  No results found.  Assessment and Plan:   1. NSVT: -Patient with known VT which has required shock in 01/2021 with LHC at that time demonstrating 2-vessel CAD with CTO  of the LCx and RCA with medical management advised with VR felt to be scar mediated at that time -He was evaluated by EP with plans for outpatient cardiac monitoring and medical management with amiodarone loading and Coreg -Outpatient cardiac monitoring showed 11 beat run of NSVT on 03/15/2021 -Recommend repletion of potassium to goal 4.0 -Magnesium at goal -Continue amiodarone and Coreg -No further cardiac intervention is needed at this time  2. Preprocedure cardiac risk stratification: -Patient admitted with what appears to be a 2nd GI bleed -DAPT on hold -GI planning for EGD -He is high risk for noncardiac procedure per Revised  Cardiac Risk Index, with an estimated rate of adverse cardiopulmonary event of > 11% -Per [facility] Activity Status Index, he can achieve > 4 METs without cardiac limitation  -No further cardiac testing or intervention will reduce his preoperative risk and he may proceed with noncardiac procedure at an overall high risk   3. Recent left MCA CVA with left ICA stenosis s/p TCAR: -ASA and Plavix on hold per IM and neurology -Vascular surgery has been consulted for further recommendations on antiplatelet therapy   4. CAD involving the native coronary arteries without angina: -No symptoms of angina -Continue medical therapy with resumption of antiplatelet therapy when able -No plans for inpatient ischemic evaluation at this time  5. Chronic combined systolic and diastolic CHF secondary to ICM: -Euvolemic -Coreg -Hypotension precludes escalation of GDMT at this time, escalate as able  6. GI bleed/anemia: -DAPT on hold, consider placing him on Plavix monotherapy prior to discharge, await vascular surgery recommendations  -IV PPI -Monitor HGB -Per GI  7. Hypokalemia: -Replete to goal 4.0   For questions or updates, please contact [place] Please consult [email or web address] for contact info under Cardiology/STEMI.   Signed, Christell Faith, PA-C [facility] Pager: [phone] 03/17/2021, 1:33 PM

## 2021-03-18 ENCOUNTER — Telehealth: Payer: Self-pay

## 2021-03-18 ENCOUNTER — Encounter: Payer: Self-pay | Admitting: Internal Medicine

## 2021-03-18 ENCOUNTER — Encounter: Payer: Medicare Other | Admitting: Speech Pathology

## 2021-03-18 DIAGNOSIS — I6529 Occlusion and stenosis of unspecified carotid artery: Secondary | ICD-10-CM

## 2021-03-18 DIAGNOSIS — K269 Duodenal ulcer, unspecified as acute or chronic, without hemorrhage or perforation: Secondary | ICD-10-CM

## 2021-03-18 DIAGNOSIS — K921 Melena: Principal | ICD-10-CM

## 2021-03-18 DIAGNOSIS — I25118 Atherosclerotic heart disease of native coronary artery with other forms of angina pectoris: Secondary | ICD-10-CM

## 2021-03-18 LAB — BASIC METABOLIC PANEL
Anion gap: 6 (ref 5–15)
BUN: 12 mg/dL (ref 8–23)
CO2: 30 mmol/L (ref 22–32)
Calcium: 9.1 mg/dL (ref 8.9–10.3)
Chloride: 106 mmol/L (ref 98–111)
Creatinine, Ser: 1 mg/dL (ref 0.61–1.24)
GFR, Estimated: >60 mL/min (ref >60)
Glucose, Bld: 107 mg/dL — ABNORMAL HIGH (ref 70–99)
Potassium: 3.9 mmol/L (ref 3.5–5.1)
Sodium: 142 mmol/L (ref 135–145)

## 2021-03-18 LAB — CBC
HCT: 27.2 % — ABNORMAL LOW (ref 39.0–52.0)
Hemoglobin: 9 g/dL — ABNORMAL LOW (ref 13.0–17.0)
MCH: 33.5 pg (ref 26.0–34.0)
MCHC: 33.1 g/dL (ref 30.0–36.0)
MCV: 101.1 fL — ABNORMAL HIGH (ref 80.0–100.0)
Platelets: 209 10*3/uL (ref 150–400)
RBC: 2.69 MIL/uL — ABNORMAL LOW (ref 4.22–5.81)
RDW: 14.9 % (ref 11.5–15.5)
WBC: 6.2 10*3/uL (ref 4.0–10.5)
nRBC: 0 % (ref 0.0–0.2)

## 2021-03-18 LAB — VITAMIN B12: Vitamin B-12: 301 pg/mL (ref 180–914)

## 2021-03-18 MED ORDER — POTASSIUM CHLORIDE CRYS ER 10 MEQ PO TBCR
10.0000 meq | EXTENDED_RELEASE_TABLET | Freq: Once | ORAL | Status: AC
  Administered 2021-03-18: 10 meq via ORAL
  Filled 2021-03-18: qty 1

## 2021-03-18 MED ORDER — ENSURE ENLIVE PO LIQD
237.0000 mL | Freq: Two times a day (BID) | ORAL | Status: DC
  Administered 2021-03-18 – 2021-03-19 (×3): 237 mL via ORAL

## 2021-03-18 NOTE — Telephone Encounter (Signed)
Received call from speech therapy that patient is currently admitted at [facility]. While patient is hospitalized, he will not be receiving that therapy.

## 2021-03-18 NOTE — Progress Notes (Signed)
Progress Note  Patient Name: Garrett Hunter Date of Encounter: 03/18/2021  Primary Cardiologist: Fletcher Anon  Subjective   No chest pain, dyspnea, or palpitations. No ventricular ectopy on telemetry. HGB low, though stable. Potassium improved to 3.9.   Inpatient Medications    Scheduled Meds:  amiodarone  200 mg Oral QHS   carvedilol  12.5 mg Oral BID   feeding supplement  237 mL Oral BID BM   [START ON 03/19/2021] pantoprazole  40 mg Intravenous Q12H   rosuvastatin  5 mg Oral QHS   Continuous Infusions:  pantoprozole (PROTONIX) infusion 8 mg/hr (03/18/21 0606)   PRN Meds: acetaminophen **OR** acetaminophen, ondansetron **OR** ondansetron (ZOFRAN) IV   Vital Signs    Vitals:   03/17/21 1553 03/17/21 1954 03/18/21 0430 03/18/21 0727  BP: (!) 144/80 (!) 152/70 140/76 (!) 157/91  Pulse: 62 67 63 61  Resp: 16 16 18 18   Temp: 97.8 F (36.6 C) (!) 97.5 F (36.4 C) 97.9 F (36.6 C) 98.2 F (36.8 C)  TempSrc: Oral Oral Oral Oral  SpO2: 100% 100% 99% 99%  Weight:      Height:        Intake/Output Summary (Last 24 hours) at 03/18/2021 1036 Last data filed at 03/18/2021 0900 Gross per 24 hour  Intake 1920 ml  Output 1425 ml  Net 495 ml   Filed Weights   03/16/21 1115  Weight: 93 kg    Telemetry    SR without ventricular ectopy - Personally Reviewed  ECG    No new tracings - Personally Reviewed  Physical Exam   GEN: No acute distress.   Neck: No JVD. Cardiac: RRR, no murmurs, rubs, or gallops.  Respiratory: Clear to auscultation bilaterally.  GI: Soft, nontender, non-distended.   MS: No edema; No deformity. Neuro:  Alert and oriented x 3; Nonfocal.  Psych: Normal affect.  Labs    Chemistry Recent Labs  Lab 03/16/21 1135 03/17/21 0404 03/18/21 0616  NA 138 141 142  K 3.3* 3.3* 3.9  CL 104 108 106  CO2 27 29 30   GLUCOSE 149* 100* 107*  BUN 40* 23 12  CREATININE 1.46* 1.14 1.00  CALCIUM 9.1 8.7* 9.1  PROT 6.3*  --   --   ALBUMIN 4.0  --    --   AST 23  --   --   ALT 16  --   --   ALKPHOS 76  --   --   BILITOT 0.6  --   --   GFRNONAA 51* >60 >60  ANIONGAP 7 4* 6     Hematology Recent Labs  Lab 03/16/21 1135 03/17/21 0404 03/18/21 0616  WBC 6.9 6.5 6.2  RBC 2.81* 2.68* 2.69*  HGB 9.4* 8.8* 9.0*  HCT 27.7* 26.3* 27.2*  MCV 98.6 98.1 101.1*  MCH 33.5 32.8 33.5  MCHC 33.9 33.5 33.1  RDW 14.5 14.6 14.9  PLT 242 210 209    Cardiac EnzymesNo results for input(s): TROPONINI in the last 168 hours. No results for input(s): TROPIPOC in the last 168 hours.   BNPNo results for input(s): BNP, PROBNP in the last 168 hours.   DDimer No results for input(s): DDIMER in the last 168 hours.   Radiology    No results found.  Cardiac Studies   LHC 01/28/2021: Conclusions: Severe two-vessel coronary artery disease with chronic total occlusions of the proximal LCx and RCA.  There are minimal luminal irregularities in the LAD without significant stenosis. Widely patent ramus intermedius  stent. Moderately elevated left ventricular filling pressure.   Recommendations: Continue IV amiodarone and ICU monitoring in the setting of sustained ventricular tachycardia that is likely scar mediated. Gentle diuresis. Aggressive secondary prevention of coronary artery disease. EP consultation for further management of VT. __________   2D echo 01/28/2021: 1. Left ventricular ejection fraction, by estimation, is 45 to 50%. The  left ventricle has mildly decreased function. The left ventricle  demonstrates global hypokinesis. There is mild left ventricular  hypertrophy. Left ventricular diastolic parameters  are consistent with Grade II diastolic dysfunction (pseudonormalization).  There is akinesis of the left ventricular, basal inferior segment.   2. Right ventricular systolic function is normal. The right ventricular  size is normal.   3. Left atrial size was mildly dilated.   4. The mitral valve is grossly normal. Trivial mitral  valve  regurgitation. No evidence of mitral stenosis.   5. The aortic valve was not well visualized. Aortic valve regurgitation  is not visualized. No aortic stenosis is present.   6. Aortic dilatation noted. There is mild dilatation of the aortic root,  measuring 39 mm.  Patient Profile     75 y.o. 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 Manuella Ghazi.  Assessment & Plan    1. NSVT: -Patient with known VT which has required shock in 01/2021 with LHC at that time demonstrating 2-vessel CAD with CTO  of the LCx and RCA with medical management advised with VT felt to be scar mediated at that time -He was evaluated by EP with plans for outpatient cardiac monitoring and medical management with amiodarone loading and Coreg -Outpatient cardiac monitoring showed 11 beat run of NSVT on 03/15/2021 -No ventricular ectopy noted on telemetry since admission  -Recommend repletion of potassium to goal 4.0 -Magnesium at goal -Continue amiodarone and Coreg, if needed, could titrate amiodarone to 400 mg daily -No further cardiac intervention is needed at this time   2. Preprocedure cardiac risk stratification: -Patient admitted with what appears to be a 2nd GI bleed -DAPT on hold -GI planning for EGD on 6/15, with possible colonoscopy/video capsule endoscopy if EGD is unrevealing  -He is high risk for noncardiac procedure per Revised Cardiac Risk Index, with an estimated rate of adverse cardiopulmonary event of > 11% -Per [facility] Activity Status Index, he can achieve > 4 METs  without cardiac limitation -No further cardiac testing or intervention will reduce his preoperative risk and he may proceed with noncardiac procedure at an overall high risk    3. Recent left MCA CVA with left ICA stenosis s/p TCAR: -ASA and Plavix on hold per IM, neurology, and vascular surgery    4. CAD involving the native coronary arteries without angina: -No symptoms of angina -Continue medical therapy with resumption of antiplatelet therapy when able -No plans for inpatient ischemic evaluation at this time   5. 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 GI bleed/anemia: -DAPT on hold -Vascular  has recommended returning to DAPT when possible, defer possibly monotherapy with Plavix, pending GI findings/recommendations, to vascular surgery  -IV PPI -Monitor HGB, stable -Per GI   7. Hypokalemia: -Improved -Replete to goal 4.0   For questions or updates, please contact [place] Please consult [email or web address] for contact info under Cardiology/STEMI.    Signed, Christell Faith, PA-C [facility] Pager: [phone] 03/18/2021, 10:36 AM

## 2021-03-18 NOTE — Progress Notes (Signed)
PROGRESS NOTE    Garrett Hunter  UDJ:[id number] DOB: [birth year] DOA: 03/16/2021 PCP: Derinda Late, MD    Brief Narrative: (Start on day 1 of progress note - keep it brief and live) 75 y.o. male with medical history significant for gastric ulcer s/p cauterization, cad s/p pci, left MCA 01/30/2021 status post unsuccessful thrombectomy and received tPA, left carotid stenosis status post left TCAR, was recommended by neurology on 02/02/2021 that he remain on aspirin and Plavix for 3 months, history of wide-complex ventricular tachycardia, on amiodarone, hypertension, hyperlipidemia admitted for melena  Hemoglobin is remained stable.  Tentative plan for EGD on 6/15   Assessment & Plan:   Principal Problem:   Upper GI bleeding Active Problems:   Coronary artery disease   Hyperlipidemia   AAA (abdominal aortic aneurysm) (HCC)   Chronic systolic heart failure (HCC)   Erectile dysfunction   GIB (gastrointestinal bleeding)   Primary hypertension   Pure hypercholesterolemia   Peptic ulcer disease   Duodenal ulcer disease   Sustained VT (ventricular tachycardia) (HCC)   AKI (acute kidney injury) (HCC)   Carotid stenosis   CKD (chronic kidney disease) stage 3, GFR 30-59 ml/min (HCC)   Hypokalemia   Melena  Melena stool-suspect upper GI bleed History of duodenal and gastric ulcers GI consulted Hemoglobin is been stable Okay for for liquid diet Plan: Continue for liquid diet for today N.p.o. after midnight Anticipate EGD tomorrow Continue holding DAPT Continue Protonix gtt.   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 Curly Shores has been consulted via secure chat and is okay with holding aspirin and Plavix for now and recommended hospitalist team to consult vascular   Left carotid stenosis-status post TCAR on 02/07/2021 - Vascular, Dr. Feliberto Gottron has been consulted for recommendations Plavix and aspirin and agrees that plavix and asa should be held at this time   Hypokalemia-suspect secondary to hydrochlorothiazide use - Monitor and replace as necessary Maintain K greater than 4, Mg greater than 2   Left upper lobe nodule that is 4 mm-outpatient follow-up   History of wide-complex ventricular tachycardia-status post cardioversion resumed amiodarone 200 mg p.o. daily - Continue Coreg 12.5 mg twice daily and Crestor -CST reported sinus rhythm with first-degree AV block and 11 beats run of V. tach on 03/15/2021 at 5:50 PM -Consulted cardiology   #Essential hypertension-patient takes hydrochlorothiazide 25 mg daily, lisinopril 20 mg daily, carvedilol 12.5 mg twice daily at home - Hold hydrochlorothiazide and lisinopril at this time as patient is low normotensive and acute kidney injury - Continue carvedilol 12.5 mg BID     # Hyperlipidemia-rosuvastatin 5 mg nightly resumed   # History of duodenal and gastric ulcer-GI would like to keep him on clear liquid today, possible EGD tomorrow   DVT prophylaxis: SCD Code Status: Full Family Communication: None today.  Offered to call patient declined disposition Plan: Status is: Inpatient  Remains inpatient appropriate because:Inpatient level of care appropriate due to severity of illness  Dispo: The patient is from: Home  Anticipated d/c is to: Home              Patient currently is not medically stable to d/c.   Difficult to place patient No  Tentative plan for EGD on 6/15     Level of care:  Med-Surg  Consultants:  GI Cardiology Vascular surgery  Procedures:  None  Antimicrobials:  None   Subjective: Seen and examined.  Resting comfortably in bed.  No visible distress.  No pain complaints.  Objective: Vitals:   03/16/21 1115 03/18/21 0430 03/18/21 0727 03/18/21 1247  BP:  140/76 (!) 157/91 (!) 116/45  Pulse:  63 61 68  Resp:  18 18 16   Temp:  97.9 F (36.6 C) 98.2 F (36.8 C) (!) 97.5 F (36.4 C)  TempSrc:  Oral Oral Oral  SpO2:  99% 99% 98%  Weight: 93 kg     Height: 5\' 11"  (1.803 m)       Intake/Output Summary (Last 24 hours) at 03/18/2021 1504 Last data filed at 03/18/2021 1413 Gross per 24 hour  Intake 1645.94 ml  Output 1425 ml  Net 220.94 ml   Filed Weights   03/16/21 1115  Weight: 93 kg    Examination:  General exam: Appears calm and comfortable  Respiratory system: Clear to auscultation. Respiratory effort normal. Cardiovascular system: S1 & S2 heard, RRR. No JVD, murmurs, rubs, gallops or clicks. No pedal edema. Gastrointestinal system: Abdomen is nondistended, soft and nontender. No organomegaly or masses felt. Normal bowel sounds heard. Central nervous system: Alert and oriented. No focal neurological deficits. Extremities: Symmetric 5 x 5 power. Skin: No rashes, lesions or ulcers Psychiatry: Judgement and insight appear normal. Mood & affect appropriate.     Data Reviewed: I have personally reviewed following labs and imaging studies  CBC: Recent Labs  Lab 03/16/21 1135 03/17/21 0404 03/18/21 0616  WBC 6.9 6.5 6.2  NEUTROABS 5.1  --   --   HGB 9.4* 8.8* 9.0*  HCT 27.7* 26.3* 27.2*  MCV 98.6 98.1 101.1*  PLT 242 210 626   Basic Metabolic Panel: Recent Labs  Lab 03/16/21 1135 03/17/21 0404 03/18/21 0616  NA 138 141 142  K 3.3* 3.3* 3.9  CL 104 108 106  CO2 27 29 30   GLUCOSE 149* 100* 107*  BUN 40* 23 12  CREATININE 1.46* 1.14 1.00  CALCIUM 9.1 8.7* 9.1  MG 2.1 2.0  --    GFR: Estimated Creatinine Clearance:  77.8 mL/min (by C-G formula based on SCr of 1 mg/dL). Liver Function Tests: Recent Labs  Lab 03/16/21 1135  AST 23  ALT 16  ALKPHOS 76  BILITOT 0.6  PROT 6.3*  ALBUMIN 4.0   Recent Labs  Lab 03/16/21 1135  LIPASE 25   No results for input(s): AMMONIA in the last 168 hours. Coagulation Profile: Recent Labs  Lab 03/16/21 1135  INR 1.0   Cardiac Enzymes: No results for input(s): CKTOTAL, CKMB, CKMBINDEX, TROPONINI in the last 168 hours. BNP (last 3 results) No results for input(s): PROBNP in the last 8760 hours. HbA1C: No results for input(s): HGBA1C in the last 72 hours. CBG: No results for input(s): GLUCAP in the last 168 hours. Lipid Profile: No results for input(s): CHOL, HDL, LDLCALC, TRIG, CHOLHDL, LDLDIRECT in the last 72 hours. Thyroid Function Tests: No results for input(s): TSH, T4TOTAL, FREET4, T3FREE, THYROIDAB in the last 72 hours. Anemia Panel: Recent Labs    03/17/21 0404  FOLATE 25.0  FERRITIN 140  TIBC 248*  IRON  51   Sepsis Labs: No results for input(s): PROCALCITON, LATICACIDVEN in the last 168 hours.  Recent Results (from the past 240 hour(s))  Resp Panel by RT-PCR (Flu A&B, Covid) Nasopharyngeal Swab     Status: None   Collection Time: 03/16/21  1:24 PM   Specimen: Nasopharyngeal Swab; Nasopharyngeal(NP) swabs in vial transport medium  Result Value Ref Range Status   SARS Coronavirus 2 by RT PCR NEGATIVE NEGATIVE Final    Comment: (NOTE) SARS-CoV-2 target nucleic acids are NOT DETECTED.  The SARS-CoV-2 RNA is generally detectable in upper respiratory specimens during the acute phase of infection. The lowest concentration of SARS-CoV-2 viral copies this assay can detect is 138 copies/mL. A negative result does not preclude SARS-Cov-2 infection and should not be used as the sole basis for treatment or other patient management decisions. A negative result may occur with  improper specimen collection/handling, submission of specimen  other than nasopharyngeal swab, presence of viral mutation(s) within the areas targeted by this assay, and inadequate number of viral copies(<138 copies/mL). A negative result must be combined with clinical observations, patient history, and epidemiological information. The expected result is Negative.  Fact Sheet for Patients:  EntrepreneurPulse.com.au  Fact Sheet for Healthcare Providers:  IncredibleEmployment.be  This test is no t yet approved or cleared by the [place] FDA and  has been authorized for detection and/or diagnosis of SARS-CoV-2 by FDA under an Emergency Use Authorization (EUA). This EUA will remain  in effect (meaning this test can be used) for the duration of the COVID-19 declaration under Section 564(b)(1) of the Act, 21 U.S.C.section 360bbb-3(b)(1), unless the authorization is terminated  or revoked sooner.       Influenza A by PCR NEGATIVE NEGATIVE Final   Influenza B by PCR NEGATIVE NEGATIVE Final    Comment: (NOTE) The Xpert Xpress SARS-CoV-2/FLU/RSV plus assay is intended as an aid in the diagnosis of influenza from Nasopharyngeal swab specimens and should not be used as a sole basis for treatment. Nasal washings and aspirates are unacceptable for Xpert Xpress SARS-CoV-2/FLU/RSV testing.  Fact Sheet for Patients: EntrepreneurPulse.com.au  Fact Sheet for Healthcare Providers: IncredibleEmployment.be  This test is not yet approved or cleared by the [place] FDA and has been authorized for detection and/or diagnosis of SARS-CoV-2 by FDA under an Emergency Use Authorization (EUA). This EUA will remain in effect (meaning this test can be used) for the duration of the COVID-19 declaration under Section 564(b)(1) of the Act, 21 U.S.C. section 360bbb-3(b)(1), unless the authorization is terminated or revoked.  Performed at [facility], [address],  [place], [place] [address]          Radiology Studies: No results found.      Scheduled Meds:  amiodarone  200 mg Oral QHS   carvedilol  12.5 mg Oral BID   feeding supplement  237 mL Oral BID BM   [START ON 03/19/2021] pantoprazole  40 mg Intravenous Q12H   rosuvastatin  5 mg Oral QHS   Continuous Infusions:  pantoprozole (PROTONIX) infusion 8 mg/hr (03/18/21 1413)     LOS: 1 day    Time spent: 15 minutes    Sidney Ace, MD [facility] Pager [phone]  If 7PM-7AM, please contact night-coverage  03/18/2021, 3:04 PM

## 2021-03-19 ENCOUNTER — Inpatient Hospital Stay: Payer: Medicare Other | Admitting: Certified Registered"

## 2021-03-19 ENCOUNTER — Encounter
Admission: EM | Disposition: A | Discharge status: Home / Self Care | Payer: Self-pay | Source: Home / Self Care | Attending: Internal Medicine

## 2021-03-19 ENCOUNTER — Encounter: Payer: Self-pay | Admitting: Internal Medicine

## 2021-03-19 DIAGNOSIS — I4729 Other ventricular tachycardia: Secondary | ICD-10-CM

## 2021-03-19 DIAGNOSIS — I472 Ventricular tachycardia: Secondary | ICD-10-CM

## 2021-03-19 DIAGNOSIS — K921 Melena: Secondary | ICD-10-CM | POA: Diagnosis not present

## 2021-03-19 HISTORY — PX: ESOPHAGOGASTRODUODENOSCOPY (EGD) WITH PROPOFOL: SHX5813

## 2021-03-19 LAB — CBC WITH DIFFERENTIAL/PLATELET
Abs Immature Granulocytes: 0.06 10*3/uL (ref 0.00–0.07)
Basophils Absolute: 0 10*3/uL (ref 0.0–0.1)
Basophils Relative: 1 %
Eosinophils Absolute: 0.2 10*3/uL (ref 0.0–0.5)
Eosinophils Relative: 3 %
HCT: 26.4 % — ABNORMAL LOW (ref 39.0–52.0)
Hemoglobin: 8.9 g/dL — ABNORMAL LOW (ref 13.0–17.0)
Immature Granulocytes: 1 %
Lymphocytes Relative: 24 %
Lymphs Abs: 1.5 10*3/uL (ref 0.7–4.0)
MCH: 34.2 pg — ABNORMAL HIGH (ref 26.0–34.0)
MCHC: 33.7 g/dL (ref 30.0–36.0)
MCV: 101.5 fL — ABNORMAL HIGH (ref 80.0–100.0)
Monocytes Absolute: 0.5 10*3/uL (ref 0.1–1.0)
Monocytes Relative: 8 %
Neutro Abs: 4 10*3/uL (ref 1.7–7.7)
Neutrophils Relative %: 63 %
Platelets: 207 10*3/uL (ref 150–400)
RBC: 2.6 MIL/uL — ABNORMAL LOW (ref 4.22–5.81)
RDW: 15 % (ref 11.5–15.5)
WBC: 6.3 10*3/uL (ref 4.0–10.5)
nRBC: 0 % (ref 0.0–0.2)

## 2021-03-19 LAB — BASIC METABOLIC PANEL
Anion gap: 9 (ref 5–15)
BUN: 11 mg/dL (ref 8–23)
CO2: 29 mmol/L (ref 22–32)
Calcium: 8.9 mg/dL (ref 8.9–10.3)
Chloride: 103 mmol/L (ref 98–111)
Creatinine, Ser: 1.1 mg/dL (ref 0.61–1.24)
GFR, Estimated: >60 mL/min (ref >60)
Glucose, Bld: 99 mg/dL (ref 70–99)
Potassium: 3.8 mmol/L (ref 3.5–5.1)
Sodium: 141 mmol/L (ref 135–145)

## 2021-03-19 LAB — VITAMIN B12: Vitamin B-12: 303 pg/mL (ref 180–914)

## 2021-03-19 SURGERY — ESOPHAGOGASTRODUODENOSCOPY (EGD) WITH PROPOFOL
Anesthesia: General

## 2021-03-19 MED ORDER — VITAMIN B-12 1000 MCG PO TABS
1000.0000 ug | ORAL_TABLET | Freq: Every day | ORAL | Status: DC
  Filled 2021-03-19 (×2): qty 1

## 2021-03-19 MED ORDER — CYANOCOBALAMIN 1000 MCG/ML IJ SOLN
1000.0000 ug | Freq: Once | INTRAMUSCULAR | Status: AC
  Administered 2021-03-19: 1000 ug via INTRAMUSCULAR
  Filled 2021-03-19 (×2): qty 1

## 2021-03-19 MED ORDER — SODIUM CHLORIDE 0.9 % IV SOLN: INTRAVENOUS | Status: DC | PRN

## 2021-03-19 MED ORDER — POLYETHYLENE GLYCOL 3350 17 GM/SCOOP PO POWD
1.0000 | Freq: Once | ORAL | Status: AC
  Administered 2021-03-19: 255 g via ORAL
  Filled 2021-03-19: qty 255

## 2021-03-19 MED ORDER — MAGNESIUM CITRATE PO SOLN
1.0000 | Freq: Once | ORAL | Status: AC
  Administered 2021-03-19: 1 via ORAL
  Filled 2021-03-19: qty 296

## 2021-03-19 MED ORDER — PROPOFOL 500 MG/50ML IV EMUL
INTRAVENOUS | Status: DC | PRN
  Administered 2021-03-19: 120 ug/kg/min via INTRAVENOUS

## 2021-03-19 MED ORDER — PROPOFOL 10 MG/ML IV BOLUS
INTRAVENOUS | Status: DC | PRN
  Administered 2021-03-19 (×2): 50 mg via INTRAVENOUS

## 2021-03-19 MED ORDER — LIDOCAINE 2% (20 MG/ML) 5 ML SYRINGE
INTRAMUSCULAR | Status: DC | PRN
  Administered 2021-03-19: 25 mg via INTRAVENOUS

## 2021-03-19 MED ORDER — SODIUM CHLORIDE 0.9 % IV SOLN: INTRAVENOUS | Status: DC

## 2021-03-19 NOTE — Anesthesia Postprocedure Evaluation (Signed)
Anesthesia Post Note  Patient: Garrett Hunter  Procedure(s) Performed: ESOPHAGOGASTRODUODENOSCOPY (EGD) WITH PROPOFOL  Patient location during evaluation: Phase II Anesthesia Type: General Level of consciousness: awake and alert, awake and oriented Pain management: pain level controlled Vital Signs Assessment: post-procedure vital signs reviewed and stable Respiratory status: spontaneous breathing, nonlabored ventilation and respiratory function stable Cardiovascular status: blood pressure returned to baseline and stable Postop Assessment: no apparent nausea or vomiting Anesthetic complications: no   No notable events documented.   Last Vitals:  Vitals:   03/19/21 1129 03/19/21 1153  BP: 117/72 (!) 152/71  Pulse: 72 64  Resp: (!) 23 18  Temp:  36.4 C  SpO2: 99% 100%    Last Pain:  Vitals:   03/19/21 1153  TempSrc: Oral  PainSc:                  Phill Mutter

## 2021-03-19 NOTE — Transfer of Care (Signed)
Immediate Anesthesia Transfer of Care Note  Patient: Garrett Hunter  Procedure(s) Performed: ESOPHAGOGASTRODUODENOSCOPY (EGD) WITH PROPOFOL  Patient Location: Endoscopy Unit  Anesthesia Type:General  Level of Consciousness: drowsy  Airway & Oxygen Therapy: Patient Spontanous Breathing  Post-op Assessment: Report given to RN and Post -op Vital signs reviewed and stable  Post vital signs: Reviewed  Last Vitals:  Vitals Value Taken Time  BP    Temp    Pulse 67 03/19/21 1109  Resp 12 03/19/21 1109  SpO2 96 % 03/19/21 1109  Vitals shown include unvalidated device data.  Last Pain:  Vitals:   03/19/21 0958  TempSrc:   PainSc: 0-No pain         Complications: No notable events documented.

## 2021-03-19 NOTE — Anesthesia Preprocedure Evaluation (Signed)
Anesthesia Evaluation  Patient identified by MRN, date of birth, ID band Patient awake    Reviewed: Allergy & Precautions, NPO status , Patient's Chart, lab work & pertinent test results, reviewed documented beta blocker date and time   Airway Mallampati: II  TM Distance: >3 FB Neck ROM: Full    Dental  (+) Teeth Intact, Dental Advisory Given   Pulmonary COPD, former smoker,    breath sounds clear to auscultation       Cardiovascular hypertension, Pt. on medications and Pt. on home beta blockers + CAD, + Past MI, + Cardiac Stents, + Peripheral Vascular Disease and +CHF   Rhythm:Regular Rate:Normal  Cardiac cath 01/28/21: Conclusions: 1.  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  There are minimal luminal irregularities in the LAD without significant stenosis. 2.  Widely patent ramus intermedius stent. 3.  Moderately elevated left ventricular filling pressure. Recommendations: 1.  Continue IV amiodarone and ICU monitoring in the setting of sustained ventricular tachycardia that is likely scar mediated. 2.  Gentle diuresis. 3.  Aggressive secondary prevention of coronary artery disease. 4.  EP consultation for further management of VT.   Echo 01/28/21: IMPRESSIONS   1. Left ventricular ejection fraction, by estimation, is 45 to 50%. The  left ventricle has mildly decreased function. The left ventricle  demonstrates global hypokinesis. There is mild left ventricular  hypertrophy. Left ventricular diastolic parameters  are consistent with Grade II diastolic dysfunction (pseudonormalization).  There is akinesis of the left ventricular, basal inferior segment.  2. Right ventricular systolic function is normal. The right ventricular  size is normal.  3. Left atrial size was mildly dilated.  4. The mitral valve is grossly normal. Trivial mitral valve  regurgitation. No  evidence of mitral stenosis.  5. The aortic valve was not well visualized. Aortic valve regurgitation  is not visualized. No aortic stenosis is present.  6. Aortic dilatation noted. There is mild dilatation of the aortic root,  measuring 39 mm.     Neuro/Psych CVA, Residual Symptoms    GI/Hepatic PUD, (+) Hepatitis -, Unspecified  Endo/Other  diabetes, Well Controlled  Renal/GU Renal InsufficiencyRenal disease     Musculoskeletal   Abdominal   Peds  Hematology   Anesthesia Other Findings Patient with moderate expressive aphasia  Coronary artery disease   Hyperlipidemia   AAA (abdominal aortic aneurysm) (HCC)   Chronic systolic heart failure (HCC)   Erectile dysfunction   GIB (gastrointestinal bleeding)   Primary hypertension   Pure hypercholesterolemia   Peptic ulcer disease   Duodenal ulcer disease   Sustained VT (ventricular tachycardia) (HCC)   AKI (acute kidney injury) (HCC)   Carotid stenosis   CKD (chronic kidney disease) stage 3, GFR 30-59 ml/min (HCC)   Hypokalemia  Left ventricular ejection fraction, by estimation, is 45 to 50%. The  left ventricle has mildly decreased function. The left ventricle  demonstrates global hypokinesis. There is mild left ventricular  hypertrophy. Left ventricular diastolic parameters  are consistent with Grade II diastolic dysfunction (pseudonormalization).  There is akinesis of the left ventricular, basal inferior segment.   He had balloon angioplasty done. 01/28/21 cath   Reproductive/Obstetrics                            Anesthesia Physical  Anesthesia Plan  ASA: 4  Anesthesia Plan: General   Post-op Pain Management:    Induction: Intravenous  PONV  Risk Score and Plan: 2 and TIVA and Propofol infusion  Airway Management Planned: Natural Airway and Nasal Cannula  Additional Equipment:   Intra-op Plan:   Post-operative Plan: Extubation in OR  Informed Consent: I have reviewed the  patients History and Physical, chart, labs and discussed the procedure including the risks, benefits and alternatives for the proposed anesthesia with the patient or authorized representative who has indicated his/her understanding and acceptance.     Dental advisory given  Plan Discussed with: CRNA, Anesthesiologist and Surgeon  Anesthesia Plan Comments: (PAT note written 02/06/2021 by Myra Gianotti, PA-C.  Recent admission for VT, s/p cardioversion 01/27/21. Acute CVA 01/30/21. On 02/02/21 EP Dr. Curt Bears wrote, "Acute stroke: Found to have carotid stenosis. Has plans for carotid artery surgery this coming Friday. At this point, his surgery seems somewhat urgent. He would be intermediate to high risk for this intermediate to high risk procedure, though he is fairly optimized with his medications, and the most recent catheterization without evidence of new or worsening coronary artery disease. No further cardiology work-up is needed prior to surgery." )       Anesthesia Quick Evaluation

## 2021-03-19 NOTE — Progress Notes (Signed)
Progress Note  Patient Name: Garrett Hunter Date of Encounter: 03/19/2021  Primary Cardiologist: Fletcher Anon  Subjective   No chest pain, dyspnea, or palpitations. Isolated PVCs. HGB low, though stable. Potassium 3.8.   Inpatient Medications    Scheduled Meds:  amiodarone  200 mg Oral QHS   carvedilol  12.5 mg Oral BID   feeding supplement  237 mL Oral BID BM   pantoprazole  40 mg Intravenous Q12H   rosuvastatin  5 mg Oral QHS   Continuous Infusions:  pantoprozole (PROTONIX) infusion 8 mg/hr (03/19/21 0303)   PRN Meds: acetaminophen **OR** acetaminophen, ondansetron **OR** ondansetron (ZOFRAN) IV   Vital Signs    Vitals:   03/18/21 2000 03/18/21 2243 03/19/21 0421 03/19/21 0844  BP: 134/69 139/69 135/77 (!) 166/83  Pulse: 65 64 62 64  Resp: 16 18 16 18   Temp: 98.6 F (37 C) 98.2 F (36.8 C) 98 F (36.7 C) 98 F (36.7 C)  TempSrc: Oral Oral Oral Oral  SpO2: 98% 96% 99% 99%  Weight:      Height:        Intake/Output Summary (Last 24 hours) at 03/19/2021 0945 Last data filed at 03/19/2021 0846 Gross per 24 hour  Intake 745.94 ml  Output 1175 ml  Net -429.06 ml    Filed Weights   03/16/21 1115  Weight: 93 kg    Telemetry    SR with rare isolated PVCs - Personally Reviewed  ECG    No new tracings - Personally Reviewed  Physical Exam   GEN: No acute distress.   Neck: No JVD. Cardiac: RRR, no murmurs, rubs, or gallops.  Respiratory: Clear to auscultation bilaterally.  GI: Soft, nontender, non-distended.   MS: No edema; No deformity. Neuro:  Alert and oriented x 3; Nonfocal.  Psych: Normal affect.  Labs    Chemistry Recent Labs  Lab 03/16/21 1135 03/17/21 0404 03/18/21 0616 03/19/21 0626  NA 138 141 142 141  K 3.3* 3.3* 3.9 3.8  CL 104 108 106 103  CO2 27 29 30 29   GLUCOSE 149* 100* 107* 99  BUN 40* 23 12 11   CREATININE 1.46* 1.14 1.00 1.10  CALCIUM 9.1 8.7* 9.1 8.9  PROT 6.3*  --   --   --   ALBUMIN 4.0  --   --   --   AST 23   --   --   --   ALT 16  --   --   --   ALKPHOS 76  --   --   --   BILITOT 0.6  --   --   --   GFRNONAA 51* >60 >60 >60  ANIONGAP 7 4* 6 9      Hematology Recent Labs  Lab 03/17/21 0404 03/18/21 0616 03/19/21 0626  WBC 6.5 6.2 6.3  RBC 2.68* 2.69* 2.60*  HGB 8.8* 9.0* 8.9*  HCT 26.3* 27.2* 26.4*  MCV 98.1 101.1* 101.5*  MCH 32.8 33.5 34.2*  MCHC 33.5 33.1 33.7  RDW 14.6 14.9 15.0  PLT 210 209 207     Cardiac EnzymesNo results for input(s): TROPONINI in the last 168 hours. No results for input(s): TROPIPOC in the last 168 hours.   BNPNo results for input(s): BNP, PROBNP in the last 168 hours.   DDimer No results for input(s): DDIMER in the last 168 hours.   Radiology    No results found.  Cardiac Studies   LHC 01/28/2021: Conclusions: Severe two-vessel coronary artery disease with chronic total  occlusions of the proximal LCx and RCA.  There are minimal luminal irregularities in the LAD without significant stenosis. Widely patent ramus intermedius stent. Moderately elevated left ventricular filling pressure.   Recommendations: Continue IV amiodarone and ICU monitoring in the setting of sustained ventricular tachycardia that is likely scar mediated. Gentle diuresis. Aggressive secondary prevention of coronary artery disease. EP consultation for further management of VT. __________   2D echo 01/28/2021: 1. Left ventricular ejection fraction, by estimation, is 45 to 50%. The  left ventricle has mildly decreased function. The left ventricle  demonstrates global hypokinesis. There is mild left ventricular  hypertrophy. Left ventricular diastolic parameters  are consistent with Grade II diastolic dysfunction (pseudonormalization).  There is akinesis of the left ventricular, basal inferior segment.   2. Right ventricular systolic function is normal. The right ventricular  size is normal.   3. Left atrial size was mildly dilated.   4. The mitral valve is grossly  normal. Trivial mitral valve  regurgitation. No evidence of mitral stenosis.   5. The aortic valve was not well visualized. Aortic valve regurgitation  is not visualized. No aortic stenosis is present.   6. Aortic dilatation noted. There is mild dilatation of the aortic root,  measuring 39 mm.  Patient Profile     75 y.o. 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 Manuella Ghazi.  Assessment & Plan    1. NSVT: -Patient with known VT which has required shock in 01/2021 with LHC at that time demonstrating 2-vessel CAD with CTO  of the LCx and RCA with medical management advised with VT felt to be scar mediated at that time -He was evaluated by EP with plans for outpatient cardiac monitoring and medical management with amiodarone loading and Coreg -Outpatient cardiac monitoring showed 11 beat run of NSVT on 03/15/2021 -No ventricular ectopy noted on telemetry since admission  -Recommend repletion of potassium to goal 4.0, post EGD recommend giving KCl 20 mEq if possible -Magnesium at goal -Continue amiodarone and Coreg, if needed, could titrate amiodarone to 400 mg daily -No further cardiac intervention is needed at this time   2. Preprocedure cardiac risk stratification: -Patient admitted with what appears to be a 2nd GI bleed -DAPT on hold -GI planning for EGD today, with possible colonoscopy/video capsule endoscopy if EGD is unrevealing  -He is high risk for noncardiac procedure per Revised Cardiac Risk Index, with an estimated rate of adverse cardiopulmonary event  of > 11% -Per [facility] Activity Status Index, he can achieve > 4 METs without cardiac limitation -No further cardiac testing or intervention will reduce his preoperative risk and he may proceed with noncardiac procedure at an overall high risk    3. Recent left MCA CVA with left ICA stenosis s/p TCAR: -ASA and Plavix on hold per IM, neurology, and vascular surgery    4. CAD involving the native coronary arteries without angina: -No symptoms of angina -Continue medical therapy with resumption of antiplatelet therapy when able -No plans for inpatient ischemic evaluation at this time   5.  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 GI bleed/anemia: -DAPT on hold -EGD today -Vascular has recommended returning to DAPT when possible, defer possibly monotherapy with Plavix, pending GI findings/recommendations, to vascular surgery  -IV PPI -Monitor HGB, stable -Per GI   7. Hypokalemia: -Improved -Replete to goal 4.0 -Recommend KCl 20 mEq post EGD   For questions or updates, please contact [place] Please consult [email or web address] for contact info under Cardiology/STEMI.    Signed, Christell Faith, PA-C [facility] Pager: [phone] 03/19/2021, 9:45 AM

## 2021-03-19 NOTE — Op Note (Signed)
[facility] Gastroenterology Patient Name: Garrett Hunter Procedure Date: 03/19/2021 10:43 AM MRN: [id number] Account #: [id number] Date of Birth: 1946-05-10 Admit Type: Inpatient Age: 75 Room: [facility] ENDO ROOM 3 Gender: Male Note Status: Finalized Procedure:             Upper GI endoscopy Indications:           Melena Providers:             Lin Landsman MD, MD Referring MD:          Caprice Renshaw MD (Referring MD) Medicines:             General Anesthesia Complications:         No immediate complications. Estimated blood loss: None. Procedure:             Pre-Anesthesia Assessment:                        - Prior to the procedure, a History and Physical was                         performed, and patient medications and allergies were                         reviewed. The patient is competent. The risks and                         benefits of the procedure and the sedation options and                         risks were discussed with the patient. All questions                         were answered and informed consent was obtained.                         Patient identification and proposed procedure were                         verified by the physician, the nurse, the                         anesthesiologist, the anesthetist and the technician                         in the pre-procedure area in the procedure room in the                         endoscopy suite. Mental Status Examination: alert and                         oriented. Airway Examination: normal oropharyngeal                         airway and neck mobility. Respiratory Examination:                         clear to auscultation. CV Examination: normal.  Prophylactic Antibiotics: The patient does not require                         prophylactic antibiotics. Prior Anticoagulants: The                         patient has taken Plavix (clopidogrel), last dose was                          3 days prior to procedure. ASA Grade Assessment: III -                         A patient with severe systemic disease. After                         reviewing the risks and benefits, the patient was                         deemed in satisfactory condition to undergo the                         procedure. The anesthesia plan was to use general                         anesthesia. Immediately prior to administration of                         medications, the patient was re-assessed for adequacy                         to receive sedatives. The heart rate, respiratory                         rate, oxygen saturations, blood pressure, adequacy of                         pulmonary ventilation, and response to care were                         monitored throughout the procedure. The physical                         status of the patient was re-assessed after the                         procedure.                        After obtaining informed consent, the endoscope was                         passed under direct vision. Throughout the procedure,                         the patient's blood pressure, pulse, and oxygen                         saturations were monitored continuously. The Endoscope  was introduced through the mouth, and advanced to the                         second part of duodenum. The upper GI endoscopy was                         accomplished without difficulty. The patient tolerated                         the procedure well. Findings:      The duodenal bulb, second portion of the duodenum and third portion of       the duodenum were normal.      One non-bleeding superficial gastric ulcer with a clean ulcer base       (Forrest Class III) was found at the incisura. The lesion was 3 mm in       largest dimension.      A few localized small erosions with no bleeding and no stigmata of       recent bleeding were found at the incisura. Biopsies  were taken with a       cold forceps for Helicobacter pylori testing.      Localized moderately erythematous mucosa without bleeding was found in       the gastric body. Biopsies were taken with a cold forceps for       Helicobacter pylori testing.      The gastric antrum was normal. Biopsies were taken with a cold forceps       for Helicobacter pylori testing.      The cardia and gastric fundus were normal on retroflexion.      The gastroesophageal junction and examined esophagus were normal.      Esophagogastric landmarks were identified: the gastroesophageal junction       was found at 44 cm from the incisors. Impression:            - Normal duodenal bulb, second portion of the duodenum                         and third portion of the duodenum.                        - Non-bleeding gastric ulcer with a clean ulcer base                         (Forrest Class III).                        - Erosive gastropathy with no bleeding and no stigmata                         of recent bleeding. Biopsied.                        - Erythematous mucosa in the gastric body. Biopsied.                        - Normal antrum. Biopsied.                        - Normal gastroesophageal junction and esophagus.                        -  Esophagogastric landmarks identified. Recommendation:        - Return patient to hospital ward for ongoing care.                        - Clear liquid diet today.                        - Perform a colonoscopy tomorrow.                        - Await pathology results. Procedure Code(s):     --- Professional ---                        [phone], Esophagogastroduodenoscopy, flexible,                         transoral; with biopsy, single or multiple Diagnosis Code(s):     --- Professional ---                        K25.9, Gastric ulcer, unspecified as acute or chronic,                         without hemorrhage or perforation                        K31.89, Other diseases of  stomach and duodenum                        K92.1, Melena (includes Hematochezia) CPT copyright 2019 American Medical Association. All rights reserved. The codes documented in this report are preliminary and upon coder review may  be revised to meet current compliance requirements. Dr. Ulyess Mort Lin Landsman MD, MD 03/19/2021 11:09:43 AM This report has been signed electronically. Number of Addenda: 0 Note Initiated On: 03/19/2021 10:43 AM Estimated Blood Loss:  Estimated blood loss: none.      [facility]

## 2021-03-19 NOTE — Progress Notes (Signed)
PROGRESS NOTE    Garrett Hunter  CZY:[id number] DOB: 1945-12-20 DOA: 03/16/2021 PCP: Derinda Late, MD    Brief Narrative: (Start on day 1 of progress note - keep it brief and live) 75 y.o. 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  Antiplatelet agents been on hold.  Status post EGD on 6/15.  1 small ulcer, unlikely to be a bleeding source.   Per GI plan for colonoscopy on 6/16.   Assessment & Plan:   Principal Problem:   Upper GI bleeding Active Problems:   Coronary artery disease   Hyperlipidemia   AAA (abdominal aortic aneurysm) (HCC)   Chronic systolic heart failure (HCC)   Erectile dysfunction   GIB (gastrointestinal bleeding)   Primary hypertension   Pure hypercholesterolemia   Peptic ulcer disease   Duodenal ulcer disease   Sustained VT (ventricular tachycardia) (HCC)   AKI (acute kidney injury) (HCC)   Carotid stenosis   CKD (chronic kidney disease) stage 3, GFR 30-59 ml/min (HCC)   Hypokalemia   Melena   NSVT (nonsustained ventricular tachycardia) (HCC)  Melena stool-suspect upper GI bleed History of duodenal and gastric ulcers GI consulted Hemoglobin is been stable EGD 6/15, 1 small nonbleeding ulcer Plan: Clear liquids for today Colonoscopy tomorrow 6/16 Continue holding DAPT Continue PPI Daily hemoglobin   Mild acute kidney injury suspect secondary to blood loss in setting of upper GI bleed - Improved with hydration   Moderate anemia secondary to blood loss in setting of upper GI bleed No indication for transfusion   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 Curly Shores has been consulted via secure chat and is okay with holding aspirin and Plavix for now and recommended hospitalist team to consult vascular   Left carotid stenosis-status post TCAR on 02/07/2021 - Vascular, Dr. Feliberto Gottron has been consulted for recommendations Plavix and aspirin and agrees that plavix and asa should be held at this time   Hypokalemia-suspect secondary to hydrochlorothiazide use - Monitor and replace as necessary Maintain K greater than 4, Mg greater than 2   Left upper lobe nodule that is 4 mm-outpatient follow-up   History of wide-complex ventricular tachycardia-status post cardioversion resumed amiodarone 200 mg p.o. daily - Continue Coreg 12.5 mg twice daily and Crestor -CST reported sinus rhythm with first-degree AV block and 11 beats run of V. tach on 03/15/2021 at 5:50 PM -Consulted cardiology   #Essential hypertension-patient takes hydrochlorothiazide 25 mg daily, lisinopril 20 mg daily, carvedilol 12.5 mg twice daily at home - Hold hydrochlorothiazide and lisinopril at this time as patient is low normotensive and acute kidney injury - Continue carvedilol 12.5 mg BID     # Hyperlipidemia-rosuvastatin 5 mg nightly resumed   # History of duodenal and gastric ulcer-GI would like to keep him on clear liquid today, possible EGD tomorrow   DVT prophylaxis: SCD Code Status: Full Family Communication: None today.  Offered to call  patient declined  disposition Plan: Status is: Inpatient  Remains inpatient appropriate because:Inpatient level of care appropriate due to severity of illness  Dispo: The patient is from: Home              Anticipated d/c is to: Home               Patient currently is not medically stable to d/c.   Difficult to place patient No  EGD negative for active bleeding.  Plan for colonoscopy 6/16     Level of care: Med-Surg  Consultants:  GI Cardiology Vascular surgery  Procedures:  None  Antimicrobials:  None   Subjective: Seen and examined.  Sitting in bed.  No visible distress.  No complaints  Objective: Vitals:   03/19/21 1109 03/19/21 1119 03/19/21 1129 03/19/21 1153  BP: (!) 94/46 103/76 117/72 (!) 152/71  Pulse: 67 66 72 64  Resp: 18 15 (!) 23 18  Temp:    97.6 F (36.4 C)  TempSrc:    Oral  SpO2: 95% 97% 99% 100%  Weight:      Height:        Intake/Output Summary (Last 24 hours) at 03/19/2021 1304 Last data filed at 03/19/2021 1106 Gross per 24 hour  Intake 705.94 ml  Output 1175 ml  Net -469.06 ml   Filed Weights   03/16/21 1115 03/19/21 0958  Weight: 93 kg 94.8 kg    Examination:  General exam: Appears calm and comfortable  Respiratory system: Clear to auscultation. Respiratory effort normal. Cardiovascular system: S1 & S2 heard, RRR. No JVD, murmurs, rubs, gallops or clicks. No pedal edema. Gastrointestinal system: Abdomen is nondistended, soft and nontender. No organomegaly or masses felt. Normal bowel sounds heard. Central nervous system: Alert and oriented. No focal neurological deficits. Extremities: Symmetric 5 x 5 power. Skin: No rashes, lesions or ulcers Psychiatry: Judgement and insight appear normal. Mood & affect appropriate.     Data Reviewed: I have personally reviewed following labs and imaging studies  CBC: Recent Labs  Lab 03/16/21 1135 03/17/21 0404 03/18/21 0616 03/19/21 0626  WBC 6.9 6.5 6.2 6.3  NEUTROABS 5.1  --   --  4.0  HGB 9.4* 8.8* 9.0* 8.9*  HCT 27.7* 26.3* 27.2* 26.4*  MCV 98.6 98.1 101.1* 101.5*  PLT 242 210 209 921   Basic Metabolic Panel: Recent Labs  Lab 03/16/21 1135 03/17/21 0404 03/18/21 0616 03/19/21 0626  NA 138 141 142 141  K  3.3* 3.3* 3.9 3.8  CL 104 108 106 103  CO2 27 29 30 29   GLUCOSE 149* 100* 107* 99  BUN 40* 23 12 11   CREATININE 1.46* 1.14 1.00 1.10  CALCIUM 9.1 8.7* 9.1 8.9  MG 2.1 2.0  --   --    GFR: Estimated Creatinine Clearance: 71.3 mL/min (by C-G formula based on SCr of 1.1 mg/dL). Liver Function Tests: Recent Labs  Lab 03/16/21 1135  AST 23  ALT 16  ALKPHOS 76  BILITOT 0.6  PROT 6.3*  ALBUMIN 4.0   Recent Labs  Lab 03/16/21 1135  LIPASE 25   No results for input(s): AMMONIA in the last 168 hours. Coagulation Profile: Recent Labs  Lab 03/16/21 1135  INR 1.0   Cardiac Enzymes: No results for input(s): CKTOTAL, CKMB, CKMBINDEX, TROPONINI in the last 168 hours. BNP (last 3 results) No results for input(s): PROBNP in the last 8760 hours. HbA1C: No results for input(s): HGBA1C in the last 72 hours. CBG: No results for input(s): GLUCAP  in the last 168 hours. Lipid Profile: No results for input(s): CHOL, HDL, LDLCALC, TRIG, CHOLHDL, LDLDIRECT in the last 72 hours. Thyroid Function Tests: No results for input(s): TSH, T4TOTAL, FREET4, T3FREE, THYROIDAB in the last 72 hours. Anemia Panel: Recent Labs    03/17/21 0404 03/18/21 0616 03/19/21 0626  VITAMINB12  --  301 303  FOLATE 25.0  --   --   FERRITIN 140  --   --   TIBC 248*  --   --   IRON 51  --   --    Sepsis Labs: No results for input(s): PROCALCITON, LATICACIDVEN in the last 168 hours.  Recent Results (from the past 240 hour(s))  Resp Panel by RT-PCR (Flu A&B, Covid) Nasopharyngeal Swab     Status: None   Collection Time: 03/16/21  1:24 PM   Specimen: Nasopharyngeal Swab; Nasopharyngeal(NP) swabs in vial transport medium  Result Value Ref Range Status   SARS Coronavirus 2 by RT PCR NEGATIVE NEGATIVE Final    Comment: (NOTE) SARS-CoV-2 target nucleic acids are NOT DETECTED.  The SARS-CoV-2 RNA is generally detectable in upper respiratory specimens during the acute phase of infection. The  lowest concentration of SARS-CoV-2 viral copies this assay can detect is 138 copies/mL. A negative result does not preclude SARS-Cov-2 infection and should not be used as the sole basis for treatment or other patient management decisions. A negative result may occur with  improper specimen collection/handling, submission of specimen other than nasopharyngeal swab, presence of viral mutation(s) within the areas targeted by this assay, and inadequate number of viral copies(<138 copies/mL). A negative result must be combined with clinical observations, patient history, and epidemiological information. The expected result is Negative.  Fact Sheet for Patients:  EntrepreneurPulse.com.au  Fact Sheet for Healthcare Providers:  IncredibleEmployment.be  This test is no t yet approved or cleared by the [place] FDA and  has been authorized for detection and/or diagnosis of SARS-CoV-2 by FDA under an Emergency Use Authorization (EUA). This EUA will remain  in effect (meaning this test can be used) for the duration of the COVID-19 declaration under Section 564(b)(1) of the Act, 21 U.S.C.section 360bbb-3(b)(1), unless the authorization is terminated  or revoked sooner.       Influenza A by PCR NEGATIVE NEGATIVE Final   Influenza B by PCR NEGATIVE NEGATIVE Final    Comment: (NOTE) The Xpert Xpress SARS-CoV-2/FLU/RSV plus assay is intended as an aid in the diagnosis of influenza from Nasopharyngeal swab specimens and should not be used as a sole basis for treatment. Nasal washings and aspirates are unacceptable for Xpert Xpress SARS-CoV-2/FLU/RSV testing.  Fact Sheet for Patients: EntrepreneurPulse.com.au  Fact Sheet for Healthcare Providers: IncredibleEmployment.be  This test is not yet approved or cleared by the [place] FDA and has been authorized for detection and/or diagnosis of SARS-CoV-2 by FDA under  an Emergency Use Authorization (EUA). This EUA will remain in effect (meaning this test can be used) for the duration of the COVID-19 declaration under Section 564(b)(1) of the Act, 21 U.S.C. section 360bbb-3(b)(1), unless the authorization is terminated or revoked.  Performed at [facility], [address] [place], [place] [address]          Radiology Studies: No results found.      Scheduled Meds:  amiodarone  200 mg Oral QHS   carvedilol  12.5 mg Oral BID   cyanocobalamin  1,000 mcg Intramuscular Once   feeding supplement  237 mL Oral BID BM   magnesium  citrate  1 Bottle Oral Once   pantoprazole  40 mg Intravenous Q12H   polyethylene glycol powder  1 Container Oral Once   rosuvastatin  5 mg Oral QHS   [START ON 03/20/2021] vitamin B-12  1,000 mcg Oral Daily   Continuous Infusions:  sodium chloride       LOS: 2 days    Time spent: 15 minutes    Sidney Ace, MD [facility] Pager [phone]  If 7PM-7AM, please contact night-coverage  03/19/2021, 1:04 PM

## 2021-03-20 ENCOUNTER — Encounter
Admission: EM | Disposition: A | Discharge status: Home / Self Care | Payer: Self-pay | Source: Home / Self Care | Attending: Internal Medicine

## 2021-03-20 ENCOUNTER — Inpatient Hospital Stay: Payer: Medicare Other | Admitting: Anesthesiology

## 2021-03-20 ENCOUNTER — Encounter: Payer: Self-pay | Admitting: Internal Medicine

## 2021-03-20 ENCOUNTER — Encounter: Payer: Medicare Other | Admitting: Speech Pathology

## 2021-03-20 DIAGNOSIS — K921 Melena: Secondary | ICD-10-CM | POA: Diagnosis not present

## 2021-03-20 DIAGNOSIS — K635 Polyp of colon: Secondary | ICD-10-CM

## 2021-03-20 HISTORY — PX: COLONOSCOPY WITH PROPOFOL: SHX5780

## 2021-03-20 LAB — CBC WITH DIFFERENTIAL/PLATELET
Abs Immature Granulocytes: 0.05 10*3/uL (ref 0.00–0.07)
Basophils Absolute: 0 10*3/uL (ref 0.0–0.1)
Basophils Relative: 1 %
Eosinophils Absolute: 0.1 10*3/uL (ref 0.0–0.5)
Eosinophils Relative: 2 %
HCT: 28 % — ABNORMAL LOW (ref 39.0–52.0)
Hemoglobin: 9.3 g/dL — ABNORMAL LOW (ref 13.0–17.0)
Immature Granulocytes: 1 %
Lymphocytes Relative: 17 %
Lymphs Abs: 1 10*3/uL (ref 0.7–4.0)
MCH: 33.5 pg (ref 26.0–34.0)
MCHC: 33.2 g/dL (ref 30.0–36.0)
MCV: 100.7 fL — ABNORMAL HIGH (ref 80.0–100.0)
Monocytes Absolute: 0.4 10*3/uL (ref 0.1–1.0)
Monocytes Relative: 7 %
Neutro Abs: 4.5 10*3/uL (ref 1.7–7.7)
Neutrophils Relative %: 72 %
Platelets: 223 10*3/uL (ref 150–400)
RBC: 2.78 MIL/uL — ABNORMAL LOW (ref 4.22–5.81)
RDW: 15.1 % (ref 11.5–15.5)
WBC: 6.1 10*3/uL (ref 4.0–10.5)
nRBC: 0 % (ref 0.0–0.2)

## 2021-03-20 LAB — BASIC METABOLIC PANEL
Anion gap: 8 (ref 5–15)
BUN: 9 mg/dL (ref 8–23)
CO2: 29 mmol/L (ref 22–32)
Calcium: 8.9 mg/dL (ref 8.9–10.3)
Chloride: 103 mmol/L (ref 98–111)
Creatinine, Ser: 1.19 mg/dL (ref 0.61–1.24)
GFR, Estimated: >60 mL/min (ref >60)
Glucose, Bld: 109 mg/dL — ABNORMAL HIGH (ref 70–99)
Potassium: 4.1 mmol/L (ref 3.5–5.1)
Sodium: 140 mmol/L (ref 135–145)

## 2021-03-20 LAB — SURGICAL PATHOLOGY

## 2021-03-20 SURGERY — COLONOSCOPY WITH PROPOFOL
Anesthesia: General

## 2021-03-20 MED ORDER — PANTOPRAZOLE SODIUM 40 MG PO TBEC: 40.0000 mg | DELAYED_RELEASE_TABLET | Freq: Two times a day (BID) | ORAL | 0 refills | Status: DC

## 2021-03-20 MED ORDER — LISINOPRIL 20 MG PO TABS: 40.0000 mg | ORAL_TABLET | Freq: Every day | ORAL | Status: DC

## 2021-03-20 MED ORDER — ONDANSETRON HCL 4 MG PO TABS: 4.0000 mg | ORAL_TABLET | Freq: Every day | ORAL | 0 refills | Status: DC | PRN

## 2021-03-20 MED ORDER — PANTOPRAZOLE SODIUM 40 MG PO TBEC: 40.0000 mg | DELAYED_RELEASE_TABLET | Freq: Every day | ORAL | 0 refills | Status: DC

## 2021-03-20 MED ORDER — LIDOCAINE HCL (PF) 2 % IJ SOLN
INTRAMUSCULAR | Status: AC
  Filled 2021-03-20: qty 2

## 2021-03-20 MED ORDER — PROPOFOL 500 MG/50ML IV EMUL
INTRAVENOUS | Status: DC | PRN
  Administered 2021-03-20: 120 ug/kg/min via INTRAVENOUS

## 2021-03-20 NOTE — Discharge Summary (Signed)
Physician Discharge Summary  Garrett Hunter BMW:[id number] DOB: Jan 12, 1946 DOA: 03/16/2021  PCP: Derinda Late, MD  Admit date: 03/16/2021 Discharge date: 03/20/2021  Admitted From: Home Disposition:  Home  Recommendations for Outpatient Follow-up:  Follow up with PCP in 1-2 weeks Please obtain BMP/CBC in one week Follow up with GI in 1-2 months  Home Health:No Equipment/Devices:None  Discharge Condition:Stable CODE STATUS:Full Diet recommendation: Heart Healthy  Brief/Interim Summary: 75 y.o. male with medical history significant for gastric ulcer s/p cauterization, cad s/p pci, left MCA 01/30/2021 status post unsuccessful thrombectomy and received tPA, left carotid stenosis status post left TCAR, was recommended by neurology on 02/02/2021 that he remain on aspirin and Plavix for 3 months, history of wide-complex ventricular tachycardia, on amiodarone, hypertension, hyperlipidemia admitted for melena   Hemoglobin is been stable since admission.  Antiplatelet agents been on hold.  Status post EGD on 6/15.  1 small ulcer, unlikely to be a bleeding source.   S/p colonoscopy 6/16.  No active bleeding source identified.  Discussed with GI, ok for discharge home.  FU with PCP in 1 week for repeat CBC.  F/U GI in 1-2 months.  BID PPI prescribed x 2 months on discharge.  Can resume DAPT from tomorrow 6/17  Discharge Diagnoses:  Principal Problem:   Upper GI bleeding Active Problems:   Coronary artery disease   Hyperlipidemia   AAA (abdominal aortic aneurysm) (HCC)   Chronic systolic heart failure (HCC)   Erectile dysfunction   GIB (gastrointestinal bleeding)   Primary hypertension   Pure hypercholesterolemia   Peptic ulcer disease   Duodenal ulcer disease   Sustained VT (ventricular tachycardia) (HCC)   AKI (acute kidney injury) ([place])   Carotid stenosis   CKD (chronic kidney disease) stage 3, GFR 30-59 ml/min (HCC)   Hypokalemia   Melena   NSVT (nonsustained ventricular  tachycardia) (HCC) Melena stool-suspect upper GI bleed History of duodenal and gastric ulcers GI consulted Hemoglobin is been stable EGD 6/15, 1 small nonbleeding ulcer C scope 6/16.  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 Curly Shores has been consulted via secure chat and is okay with holding aspirin and Plavix for now and recommended hospitalist team to consult vascular - Resume DAPT on 6/17    Left carotid stenosis-status post TCAR on 02/07/2021 - Vascular, Dr. Feliberto Gottron has been consulted for recommendations Plavix and aspirin and agrees that plavix and asa should be held at this time - Can resume DAPT 6/17   Discharge Instructions  Discharge Instructions     Diet - low sodium heart healthy   Complete by: As directed    Increase activity slowly   Complete by: As directed       Allergies as of 03/20/2021       Reactions   Crestor [rosuvastatin]    Cramps. (Hight dose of Crestor)   Livalo [  pitavastatin]    myalgia   Pravachol [pravastatin Sodium]    Cramps   Zocor [simvastatin]    Cramps        Medication List     TAKE these medications    aspirin EC 81 MG tablet Take 81 mg by mouth daily. Swallow whole.   clopidogrel 75 MG tablet Commonly known as: PLAVIX Take 1 tablet (75 mg total) by mouth daily.   CoQ10 100 MG Caps Take 1 capsule by mouth every  evening.   lisinopril-hydrochlorothiazide 20-12.5 MG tablet Commonly known as: ZESTORETIC Take 2 tablets by mouth daily.   multivitamin with minerals Tabs tablet Take 1 tablet by mouth daily.   ondansetron 4 MG tablet Commonly known as: Zofran Take 1 tablet (4 mg total) by mouth daily as needed for nausea or vomiting.   pantoprazole 40 MG tablet Commonly known as: Protonix Take 1 tablet (40 mg total) by mouth 2 (two) times daily.   vitamin C 500 MG tablet Commonly known as: ASCORBIC ACID Take 500 mg by mouth daily.       ASK your doctor about these medications    amiodarone 200 MG tablet Commonly known as: PACERONE Take 1 tablet (200 mg total) by mouth daily.   carvedilol 12.5 MG tablet Commonly known as: COREG TAKE 1 TABLET TWICE A DAY   hydrochlorothiazide 25 MG tablet Commonly known as: HYDRODIURIL Take 1 tablet (25 mg total) by mouth daily.   lisinopril 20 MG tablet Commonly known as: ZESTRIL Take 2 tablets (40 mg total) by mouth daily.   rosuvastatin 5 MG tablet Commonly known as: CRESTOR TAKE 1 TABLET DAILY        Follow-up Information     Derinda Late, MD. Schedule an appointment as soon as possible for a visit in 1 week(s).   Specialty: Family Medicine Why: Needs CBC Contact information: [address] [facility] [place] [address] [phone]         Lin Landsman, MD Follow up.   Specialty: Gastroenterology Why: Follow up 1-2 months Contact information: [place] [address] [phone]                Allergies  Allergen Reactions   Crestor [Rosuvastatin]     Cramps. (Hight dose of Crestor)   Livalo [Pitavastatin]     myalgia   Pravachol [Pravastatin Sodium]     Cramps   Zocor [Simvastatin]     Cramps    Consultations: GI Cardiology Vascular surgery   Procedures/Studies: VAS US CAROTID  Result Date: 03/11/2021 Carotid Arterial Duplex  Study Patient Name:  Garrett Hunter [facility]  Date of Exam:   03/10/2021 Medical Rec #: [id number]           Accession #:    [id number] Date of Birth: January 14, 1946           Patient Gender: M Patient Age:   74Y Exam Location:  [facility] Procedure:      VAS US CAROTID Referring Phys: [id number] [name] --------------------------------------------------------------------------------  Indications:   Left TCAR 02/06/2021 Risk Factors:  Hypertension, hyperlipidemia, Diabetes, coronary artery disease,                prior CVA. Other Factors: Chronic systolic heart failure. Performing Technologist: Alvia Grove RVT  Examination Guidelines: A complete evaluation includes B-mode imaging, spectral Doppler, color Doppler, and power Doppler as needed of all accessible portions of each vessel. Bilateral  testing is considered an integral part of a complete examination. Limited examinations for reoccurring indications may be performed as noted.  Right Carotid Findings: +----------+--------+--------+--------+------------------+--------+           PSV cm/sEDV cm/sStenosisPlaque DescriptionComments +----------+--------+--------+--------+------------------+--------+ CCA Prox  59      11                                         +----------+--------+--------+--------+------------------+--------+ CCA Mid   85      21                                         +----------+--------+--------+--------+------------------+--------+ CCA Distal75      16              homogeneous                +----------+--------+--------+--------+------------------+--------+ ICA Prox  66      14      1-39%   heterogenous               +----------+--------+--------+--------+------------------+--------+ ICA Mid   66      24                                         +----------+--------+--------+--------+------------------+--------+ ICA Distal75      25                                          +----------+--------+--------+--------+------------------+--------+ ECA       163     21              homogeneous                +----------+--------+--------+--------+------------------+--------+ +----------+--------+-------+----------------+-------------------+           PSV cm/sEDV cmsDescribe        Arm Pressure (mmHG) +----------+--------+-------+----------------+-------------------+ Subclavian162     7      Multiphasic, WNL                    +----------+--------+-------+----------------+-------------------+ +---------+--------+--+--------+--+---------+ VertebralPSV cm/s52EDV cm/s14Antegrade +---------+--------+--+--------+--+---------+  Left Carotid Findings: +----------+--------+--------+--------+------------------+--------+           PSV cm/sEDV cm/sStenosisPlaque DescriptionComments +----------+--------+--------+--------+------------------+--------+ CCA Prox  92      19                                         +----------+--------+--------+--------+------------------+--------+ CCA Mid   99      20                                         +----------+--------+--------+--------+------------------+--------+ CCA Distal66      16                                         +----------+--------+--------+--------+------------------+--------+ ICA Prox  stent    +----------+--------+--------+--------+------------------+--------+ ICA Mid                                             stent    +----------+--------+--------+--------+------------------+--------+ ICA Distal77      25                                         +----------+--------+--------+--------+------------------+--------+ ECA       150     13                                         +----------+--------+--------+--------+------------------+--------+ +----------+--------+--------+----------------+-------------------+           PSV cm/sEDV  cm/sDescribe        Arm Pressure (mmHG) +----------+--------+--------+----------------+-------------------+ Subclavian204     5       Multiphasic, WNL                    +----------+--------+--------+----------------+-------------------+ +---------+--------+--+--------+--+---------+ VertebralPSV cm/s33EDV cm/s12Antegrade +---------+--------+--+--------+--+---------+  Left Stent(s): +---------------+--+--++++ Prox to Stent  6214 +---------------+--+--++++ Proximal Stent 6817 +---------------+--+--++++ Mid Stent      5417 +---------------+--+--++++ Distal Stent   7923 +---------------+--+--++++ Distal to [id number] +---------------+--+--++++    Summary: Right Carotid: Velocities in the right ICA are consistent with a 1-39% stenosis. Left Carotid: Patent stent with no evidence for restenosis.  *See table(s) above for measurements and observations.  Electronically signed by Jamelle Haring on 03/11/2021 at 8:37:15 AM.    Final    ([place], Carotid, EGD, Colonoscopy, ERCP)    Subjective: Seen and examined after c scope on the day of discharge.  No complaints, feels well.  OK for discharge home  Discharge Exam: Vitals:   03/20/21 1417 03/20/21 1427  BP: 118/65 (!) 155/67  Pulse: 64 60  Resp: 17 14  Temp:    SpO2: 99% 99%   Vitals:   03/20/21 1357 03/20/21 1407 03/20/21 1417 03/20/21 1427  BP: (!) 94/40 (!) 105/48 118/65 (!) 155/67  Pulse: 60 (!) 55 64 60  Resp: 12 12 17 14   Temp: (!) 97 F (36.1 C)     TempSrc: Temporal     SpO2: 99% 100% 99% 99%  Weight:      Height:        General: Pt is alert, awake, not in acute distress Cardiovascular: RRR, S1/S2 +, no rubs, no gallops Respiratory: CTA bilaterally, no wheezing, no rhonchi Abdominal: Soft, NT, ND, bowel sounds + Extremities: no edema, no cyanosis    The results of significant diagnostics from this hospitalization (including imaging, microbiology, ancillary and laboratory) are listed below  for reference.     Microbiology: Recent Results (from the past 240 hour(s))  Resp Panel by RT-PCR (Flu A&B, Covid) Nasopharyngeal Swab     Status: None   Collection Time: 03/16/21  1:24 PM   Specimen: Nasopharyngeal Swab; Nasopharyngeal(NP) swabs in vial transport medium  Result Value Ref Range Status   SARS Coronavirus 2 by RT PCR NEGATIVE NEGATIVE Final    Comment: (NOTE) SARS-CoV-2 target nucleic acids are NOT DETECTED.  The SARS-CoV-2 RNA is generally detectable in upper respiratory specimens during the acute phase of infection. The lowest concentration of SARS-CoV-2 viral  copies this assay can detect is 138 copies/mL. A negative result does not preclude SARS-Cov-2 infection and should not be used as the sole basis for treatment or other patient management decisions. A negative result may occur with  improper specimen collection/handling, submission of specimen other than nasopharyngeal swab, presence of viral mutation(s) within the areas targeted by this assay, and inadequate number of viral copies(<138 copies/mL). A negative result must be combined with clinical observations, patient history, and epidemiological information. The expected result is Negative.  Fact Sheet for Patients:  EntrepreneurPulse.com.au  Fact Sheet for Healthcare Providers:  IncredibleEmployment.be  This test is no t yet approved or cleared by the [place] FDA and  has been authorized for detection and/or diagnosis of SARS-CoV-2 by FDA under an Emergency Use Authorization (EUA). This EUA will remain  in effect (meaning this test can be used) for the duration of the COVID-19 declaration under Section 564(b)(1) of the Act, 21 U.S.C.section 360bbb-3(b)(1), unless the authorization is terminated  or revoked sooner.       Influenza A by PCR NEGATIVE NEGATIVE Final   Influenza B by PCR NEGATIVE NEGATIVE Final    Comment: (NOTE) The Xpert Xpress  SARS-CoV-2/FLU/RSV plus assay is intended as an aid in the diagnosis of influenza from Nasopharyngeal swab specimens and should not be used as a sole basis for treatment. Nasal washings and aspirates are unacceptable for Xpert Xpress SARS-CoV-2/FLU/RSV testing.  Fact Sheet for Patients: EntrepreneurPulse.com.au  Fact Sheet for Healthcare Providers: IncredibleEmployment.be  This test is not yet approved or cleared by the [place] FDA and has been authorized for detection and/or diagnosis of SARS-CoV-2 by FDA under an Emergency Use Authorization (EUA). This EUA will remain in effect (meaning this test can be used) for the duration of the COVID-19 declaration under Section 564(b)(1) of the Act, 21 U.S.C. section 360bbb-3(b)(1), unless the authorization is terminated or revoked.  Performed at [facility], [address] [place], [place] [address]      Labs: BNP (last 3 results) No results for input(s): BNP in the last 8760 hours. Basic Metabolic Panel: Recent Labs  Lab 03/16/21 1135 03/17/21 0404 03/18/21 0616 03/19/21 0626 03/20/21 0841  NA 138 141 142 141 140  K 3.3* 3.3* 3.9 3.8 4.1  CL 104 108 106 103 103  CO2 27 29 30 29 29   GLUCOSE 149* 100* 107* 99 109*  BUN 40* 23 12 11 9   CREATININE 1.46* 1.14 1.00 1.10 1.19  CALCIUM 9.1 8.7* 9.1 8.9 8.9  MG 2.1 2.0  --   --   --    Liver Function Tests: Recent Labs  Lab 03/16/21 1135  AST 23  ALT 16  ALKPHOS 76  BILITOT 0.6  PROT 6.3*  ALBUMIN 4.0   Recent Labs  Lab 03/16/21 1135  LIPASE 25   No results for input(s): AMMONIA in the last 168 hours. CBC: Recent Labs  Lab 03/16/21 1135 03/17/21 0404 03/18/21 0616 03/19/21 0626 03/20/21 0841  WBC 6.9 6.5 6.2 6.3 6.1  NEUTROABS 5.1  --   --  4.0 4.5  HGB 9.4* 8.8* 9.0* 8.9* 9.3*  HCT 27.7* 26.3* 27.2* 26.4* 28.0*  MCV 98.6 98.1 101.1* 101.5* 100.7*  PLT 242 210 209 207 223   Cardiac Enzymes: No results  for input(s): CKTOTAL, CKMB, CKMBINDEX, TROPONINI in the last 168 hours. BNP: Invalid input(s): POCBNP CBG: No results for input(s): GLUCAP in the last 168 hours. D-Dimer No results for input(s): DDIMER in the last 72 hours.  Hgb A1c No results for input(s): HGBA1C in the last 72 hours. Lipid Profile No results for input(s): CHOL, HDL, LDLCALC, TRIG, CHOLHDL, LDLDIRECT in the last 72 hours. Thyroid function studies No results for input(s): TSH, T4TOTAL, T3FREE, THYROIDAB in the last 72 hours.  Invalid input(s): FREET3 Anemia work up Recent Labs    03/18/21 0616 03/19/21 0626  VITAMINB12 301 303   Urinalysis    Component Value Date/Time   COLORURINE YELLOW 02/07/2021 0757   APPEARANCEUR HAZY (A) 02/07/2021 0757   LABSPEC 1.019 02/07/2021 0757   PHURINE 6.0 02/07/2021 0757   GLUCOSEU NEGATIVE 02/07/2021 0757   HGBUR NEGATIVE 02/07/2021 0757   BILIRUBINUR NEGATIVE 02/07/2021 [place] 02/07/2021 0757   PROTEINUR NEGATIVE 02/07/2021 0757   NITRITE NEGATIVE 02/07/2021 0757   LEUKOCYTESUR NEGATIVE 02/07/2021 0757   Sepsis Labs Invalid input(s): PROCALCITONIN,  WBC,  LACTICIDVEN Microbiology Recent Results (from the past 240 hour(s))  Resp Panel by RT-PCR (Flu A&B, Covid) Nasopharyngeal Swab     Status: None   Collection Time: 03/16/21  1:24 PM   Specimen: Nasopharyngeal Swab; Nasopharyngeal(NP) swabs in vial transport medium  Result Value Ref Range Status   SARS Coronavirus 2 by RT PCR NEGATIVE NEGATIVE Final    Comment: (NOTE) SARS-CoV-2 target nucleic acids are NOT DETECTED.  The SARS-CoV-2 RNA is generally detectable in upper respiratory specimens during the acute phase of infection. The lowest concentration of SARS-CoV-2 viral copies this assay can detect is 138 copies/mL. A negative result does not preclude SARS-Cov-2 infection and should not be used as the sole basis for treatment or other patient management decisions. A negative result may occur  with  improper specimen collection/handling, submission of specimen other than nasopharyngeal swab, presence of viral mutation(s) within the areas targeted by this assay, and inadequate number of viral copies(<138 copies/mL). A negative result must be combined with clinical observations, patient history, and epidemiological information. The expected result is Negative.  Fact Sheet for Patients:  EntrepreneurPulse.com.au  Fact Sheet for Healthcare Providers:  IncredibleEmployment.be  This test is no t yet approved or cleared by the [place] FDA and  has been authorized for detection and/or diagnosis of SARS-CoV-2 by FDA under an Emergency Use Authorization (EUA). This EUA will remain  in effect (meaning this test can be used) for the duration of the COVID-19 declaration under Section 564(b)(1) of the Act, 21 U.S.C.section 360bbb-3(b)(1), unless the authorization is terminated  or revoked sooner.       Influenza A by PCR NEGATIVE NEGATIVE Final   Influenza B by PCR NEGATIVE NEGATIVE Final    Comment: (NOTE) The Xpert Xpress SARS-CoV-2/FLU/RSV plus assay is intended as an aid in the diagnosis of influenza from Nasopharyngeal swab specimens and should not be used as a sole basis for treatment. Nasal washings and aspirates are unacceptable for Xpert Xpress SARS-CoV-2/FLU/RSV testing.  Fact Sheet for Patients: EntrepreneurPulse.com.au  Fact Sheet for Healthcare Providers: IncredibleEmployment.be  This test is not yet approved or cleared by the [place] FDA and has been authorized for detection and/or diagnosis of SARS-CoV-2 by FDA under an Emergency Use Authorization (EUA). This EUA will remain in effect (meaning this test can be used) for the duration of the COVID-19 declaration under Section 564(b)(1) of the Act, 21 U.S.C. section 360bbb-3(b)(1), unless the authorization is terminated  or revoked.  Performed at [facility], [address] [place], [place] [address]      Time coordinating discharge: Over 30 minutes  SIGNED:  Sidney Ace, MD  [facility] 03/20/2021, 4:40 PM Pager   If 7PM-7AM, please contact night-coverage

## 2021-03-20 NOTE — Transfer of Care (Signed)
Immediate Anesthesia Transfer of Care Note  Patient: Garrett Hunter  Procedure(s) Performed: COLONOSCOPY WITH PROPOFOL  Patient Location: PACU  Anesthesia Type:General  Level of Consciousness: awake and sedated  Airway & Oxygen Therapy: Patient connected to nasal cannula oxygen  Post-op Assessment: Report given to RN and Post -op Vital signs reviewed and stable  Post vital signs: Reviewed and stable  Last Vitals:  Vitals Value Taken Time  BP    Temp    Pulse    Resp    SpO2      Last Pain:  Vitals:   03/20/21 1302  TempSrc: Temporal  PainSc: 0-No pain         Complications: No notable events documented.

## 2021-03-20 NOTE — Progress Notes (Signed)
PROGRESS NOTE    Garrett Hunter  TGG:[id number] DOB: 24-Apr-1946 DOA: 03/16/2021 PCP: Derinda Late, MD    Brief Narrative: (Start on day 1 of progress note - keep it brief and live) 75 y.o. 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  Antiplatelet agents been on hold.  Status post EGD on 6/15.  1 small ulcer, unlikely to be a bleeding source.   Per GI plan for colonoscopy on 6/16.   Assessment & Plan:   Principal Problem:   Upper GI bleeding Active Problems:   Coronary artery disease   Hyperlipidemia   AAA (abdominal aortic aneurysm) (HCC)   Chronic systolic heart failure (HCC)   Erectile dysfunction   GIB (gastrointestinal bleeding)   Primary hypertension   Pure hypercholesterolemia   Peptic ulcer disease   Duodenal ulcer disease   Sustained VT (ventricular tachycardia) (HCC)   AKI (acute kidney injury) (HCC)   Carotid stenosis   CKD (chronic kidney disease) stage 3, GFR 30-59 ml/min (HCC)   Hypokalemia   Melena   NSVT (nonsustained ventricular tachycardia) (HCC)  Melena stool-suspect upper GI bleed History of duodenal and gastric ulcers GI consulted Hemoglobin is been stable EGD 6/15, 1 small nonbleeding ulcer Plan: N.p.o. today for colonoscopy.  Continue PPI.  Continue daily hemoglobin.   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 Curly Shores has been consulted via secure chat and is okay with holding aspirin and Plavix for now and recommended hospitalist team to consult vascular   Left carotid stenosis-status post TCAR on 02/07/2021 - Vascular, Dr. Feliberto Gottron has been consulted for recommendations Plavix and aspirin and agrees that plavix and asa should be held at this time   Hypokalemia-suspect secondary to hydrochlorothiazide use - Monitor and replace as necessary Maintain K greater than 4, Mg greater than 2   Left upper lobe nodule that is 4 mm-outpatient follow-up   History of wide-complex ventricular tachycardia-status post cardioversion resumed amiodarone 200 mg p.o. daily - Continue Coreg 12.5 mg twice daily and Crestor -CST reported sinus rhythm with first-degree AV block and 11 beats run of V. tach on 03/15/2021 at 5:50 PM -Consulted cardiology   #Essential hypertension-patient takes hydrochlorothiazide 25 mg daily, lisinopril 20 mg daily, carvedilol 12.5 mg twice daily at home - Hold hydrochlorothiazide and lisinopril at this time as patient is low normotensive and acute kidney injury - Continue carvedilol 12.5 mg BID     # Hyperlipidemia-rosuvastatin 5 mg nightly resumed   # History of duodenal and gastric ulcer-GI would like to keep him on clear liquid today, possible EGD tomorrow   DVT prophylaxis: SCD Code Status: Full Family Communication: None today.  Offered to call patient declined  disposition Plan: Status is: Inpatient  Remains inpatient appropriate because:Inpatient level of care appropriate due to severity of illness  Dispo: The patient is from: Home              Anticipated d/c is to: Home              Patient currently  is not medically stable to d/c.   Difficult to place patient No Colonoscopy today 6/16     Level of care: Med-Surg  Consultants:  GI Cardiology Vascular surgery  Procedures:  None  Antimicrobials:  None   Subjective: Seen and examined.  Sitting up in bed.  No visible distress.  No pain complaints.  Objective: Vitals:   03/19/21 2320 03/20/21 0353 03/20/21 0728 03/20/21 1157  BP: (!) 152/73 (!) 163/66 (!) 165/79 (!) 153/73  Pulse: (!) 59 62 65 (!) 57  Resp: 18 18 17 16   Temp: 97.9 F (36.6 C) 98.1 F (36.7 C) 98.6 F (37 C) 97.8 F (36.6 C)  TempSrc: Oral Oral Oral Oral  SpO2: 99% 99% 100% 99%  Weight:      Height:        Intake/Output Summary (Last 24 hours) at 03/20/2021 1222 Last data filed at 03/20/2021 1000 Gross per 24 hour  Intake 1560 ml  Output 950 ml  Net 610 ml   Filed Weights   03/16/21 1115 03/19/21 0958  Weight: 93 kg 94.8 kg    Examination:  General exam: Appears calm and comfortable  Respiratory system: Clear to auscultation. Respiratory effort normal. Cardiovascular system: S1 & S2 heard, RRR. No JVD, murmurs, rubs, gallops or clicks. No pedal edema. Gastrointestinal system: Abdomen is nondistended, soft and nontender. No organomegaly or masses felt. Normal bowel sounds heard. Central nervous system: Alert and oriented. No focal neurological deficits. Extremities: Symmetric 5 x 5 power. Skin: No rashes, lesions or ulcers Psychiatry: Judgement and insight appear normal. Mood & affect appropriate.     Data Reviewed: I have personally reviewed following labs and imaging studies  CBC: Recent Labs  Lab 03/16/21 1135 03/17/21 0404 03/18/21 0616 03/19/21 0626 03/20/21 0841  WBC 6.9 6.5 6.2 6.3 6.1  NEUTROABS 5.1  --   --  4.0 4.5  HGB 9.4* 8.8* 9.0* 8.9* 9.3*  HCT 27.7* 26.3* 27.2* 26.4* 28.0*  MCV 98.6 98.1 101.1* 101.5* 100.7*  PLT 242 210 209 207 564   Basic Metabolic Panel: Recent Labs  Lab 03/16/21 1135 03/17/21 0404  03/18/21 0616 03/19/21 0626 03/20/21 0841  NA 138 141 142 141 140  K 3.3* 3.3* 3.9 3.8 4.1  CL 104 108 106 103 103  CO2 27 29 30 29 29   GLUCOSE 149* 100* 107* 99 109*  BUN 40* 23 12 11 9   CREATININE 1.46* 1.14 1.00 1.10 1.19  CALCIUM 9.1 8.7* 9.1 8.9 8.9  MG 2.1 2.0  --   --   --    GFR: Estimated Creatinine Clearance: 66 mL/min (by C-G formula based on SCr of 1.19 mg/dL). Liver Function Tests: Recent Labs  Lab 03/16/21 1135  AST 23  ALT 16  ALKPHOS 76  BILITOT 0.6  PROT 6.3*  ALBUMIN 4.0   Recent Labs  Lab 03/16/21 1135  LIPASE 25   No results for input(s): AMMONIA in the last 168 hours. Coagulation Profile: Recent Labs  Lab 03/16/21 1135  INR 1.0   Cardiac Enzymes: No results for input(s): CKTOTAL, CKMB, CKMBINDEX, TROPONINI in the last 168 hours. BNP (last 3 results) No results for  input(s): PROBNP in the last 8760 hours. HbA1C: No results for input(s): HGBA1C in the last 72 hours. CBG: No results for input(s): GLUCAP in the last 168 hours. Lipid Profile: No results for input(s): CHOL, HDL, LDLCALC, TRIG, CHOLHDL, LDLDIRECT in the last 72 hours. Thyroid Function Tests: No results for input(s): TSH, T4TOTAL, FREET4, T3FREE, THYROIDAB in the last 72 hours. Anemia Panel: Recent Labs    03/18/21 0616 03/19/21 0626  VITAMINB12 301 303   Sepsis Labs: No results for input(s): PROCALCITON, LATICACIDVEN in the last 168 hours.  Recent Results (from the past 240 hour(s))  Resp Panel by RT-PCR (Flu A&B, Covid) Nasopharyngeal Swab     Status: None   Collection Time: 03/16/21  1:24 PM   Specimen: Nasopharyngeal Swab; Nasopharyngeal(NP) swabs in vial transport medium  Result Value Ref Range Status   SARS Coronavirus 2 by RT PCR NEGATIVE NEGATIVE Final    Comment: (NOTE) SARS-CoV-2 target nucleic acids are NOT DETECTED.  The SARS-CoV-2 RNA is generally detectable in upper respiratory specimens during the acute phase of infection. The lowest concentration of  SARS-CoV-2 viral copies this assay can detect is 138 copies/mL. A negative result does not preclude SARS-Cov-2 infection and should not be used as the sole basis for treatment or other patient management decisions. A negative result may occur with  improper specimen collection/handling, submission of specimen other than nasopharyngeal swab, presence of viral mutation(s) within the areas targeted by this assay, and inadequate number of viral copies(<138 copies/mL). A negative result must be combined with clinical observations, patient history, and epidemiological information. The expected result is Negative.  Fact Sheet for Patients:  EntrepreneurPulse.com.au  Fact Sheet for Healthcare Providers:  IncredibleEmployment.be  This test is no t yet approved or cleared by the [place] FDA and  has been authorized for detection and/or diagnosis of SARS-CoV-2 by FDA under an Emergency Use Authorization (EUA). This EUA will remain  in effect (meaning this test can be used) for the duration of the COVID-19 declaration under Section 564(b)(1) of the Act, 21 U.S.C.section 360bbb-3(b)(1), unless the authorization is terminated  or revoked sooner.       Influenza A by PCR NEGATIVE NEGATIVE Final   Influenza B by PCR NEGATIVE NEGATIVE Final    Comment: (NOTE) The Xpert Xpress SARS-CoV-2/FLU/RSV plus assay is intended as an aid in the diagnosis of influenza from Nasopharyngeal swab specimens and should not be used as a sole basis for treatment. Nasal washings and aspirates are unacceptable for Xpert Xpress SARS-CoV-2/FLU/RSV testing.  Fact Sheet for Patients: EntrepreneurPulse.com.au  Fact Sheet for Healthcare Providers: IncredibleEmployment.be  This test is not yet approved or cleared by the [place] FDA and has been authorized for detection and/or diagnosis of SARS-CoV-2 by FDA under an Emergency Use  Authorization (EUA). This EUA will remain in effect (meaning this test can be used) for the duration of the COVID-19 declaration under Section 564(b)(1) of the Act, 21 U.S.C. section 360bbb-3(b)(1), unless the authorization is terminated or revoked.  Performed at [facility], [address], [place], [place] [address]          Radiology Studies: No results found.      Scheduled Meds:  amiodarone  200 mg Oral QHS   carvedilol  12.5 mg Oral BID   feeding supplement  237 mL Oral BID BM   lisinopril  40 mg Oral Daily   pantoprazole  40 mg Intravenous Q12H   rosuvastatin  5 mg Oral QHS   vitamin B-12  1,000 mcg Oral Daily   Continuous Infusions:  sodium chloride       LOS: 3 days    Time spent: 15 minutes    Sidney Ace, MD [facility] Pager [phone]  If 7PM-7AM, please contact night-coverage  03/20/2021, 12:22 PM

## 2021-03-20 NOTE — Op Note (Signed)
[facility] Patient Name: Garrett Hunter Procedure Date: 03/20/2021 1:29 PM MRN: [id number] Account #: [id number] Date of Birth: 1946/08/25 Admit Type: Inpatient Age: 75 Room: [facility] ENDO ROOM 2 Gender: Male Note Status: Finalized Procedure:             Colonoscopy Indications:           Last colonoscopy: January 2011, Melena Providers:             Lin Landsman MD, MD Referring MD:          Caprice Renshaw MD (Referring MD) Medicines:             General Anesthesia Complications:         No immediate complications. Estimated blood loss: None. Procedure:             Pre-Anesthesia Assessment:                        - Prior to the procedure, a History and Physical was                         performed, and patient medications and allergies were                         reviewed. The patient is competent. The risks and                         benefits of the procedure and the sedation options and                         risks were discussed with the patient. All questions                         were answered and informed consent was obtained.                         Patient identification and proposed procedure were                         verified by the physician, the nurse, the                         anesthesiologist, the anesthetist and the technician                         in the pre-procedure area in the procedure room in the                         endoscopy suite. Mental Status Examination: alert and                         oriented. Airway Examination: normal oropharyngeal                         airway and neck mobility. Respiratory Examination:                         clear to auscultation. CV Examination: normal.  Prophylactic Antibiotics: The patient does not require                         prophylactic antibiotics. Prior Anticoagulants: The                         patient has taken Plavix (clopidogrel),  last dose was                         4 days prior to procedure. ASA Grade Assessment: III -                         A patient with severe systemic disease. After                         reviewing the risks and benefits, the patient was                         deemed in satisfactory condition to undergo the                         procedure. The anesthesia plan was to use general                         anesthesia. Immediately prior to administration of                         medications, the patient was re-assessed for adequacy                         to receive sedatives. The heart rate, respiratory                         rate, oxygen saturations, blood pressure, adequacy of                         pulmonary ventilation, and response to care were                         monitored throughout the procedure. The physical                         status of the patient was re-assessed after the                         procedure.                        After obtaining informed consent, the colonoscope was                         passed under direct vision. Throughout the procedure,                         the patient's blood pressure, pulse, and oxygen                         saturations were monitored continuously. The  Colonoscope was introduced through the anus and                         advanced to the the terminal ileum, with                         identification of the appendiceal orifice and IC                         valve. The colonoscopy was performed with moderate                         difficulty due to poor bowel prep, significant looping                         and the patient's body habitus. Successful completion                         of the procedure was aided by applying abdominal                         pressure. The patient tolerated the procedure well.                         The quality of the bowel preparation was poor. Findings:      The  perianal and digital rectal examinations were normal. Pertinent       negatives include normal sphincter tone and no palpable rectal lesions.      The terminal ileum appeared normal.      Two sessile polyps were found in the cecum and appendiceal orifice. The       polyps were 5 mm in size. These polyps were removed with a cold snare.       Resection and retrieval were complete.      Multiple diverticula were found in the entire colon.      Non-bleeding external hemorrhoids were found during retroflexion. The       hemorrhoids were large. Impression:            - Preparation of the colon was poor.                        - The examined portion of the ileum was normal.                        - Two 5 mm polyps in the cecum and at the appendiceal                         orifice, removed with a cold snare. Resected and                         retrieved.                        - Diverticulosis in the entire examined colon.                        - Non-bleeding external hemorrhoids. Recommendation:        - Return patient to hospital ward for ongoing care.                        -  Advance diet as tolerated today.                        - Continue present medications.                        - To visualize the small bowel, perform video capsule                         endoscopy at appointment to be scheduled.                        - Resume Plavix (clopidogrel) at prior dose tomorrow.                         Refer to managing physician for further adjustment of                         therapy.                        - Return to my office in 2 months. Procedure Code(s):     --- Professional ---                        [phone], Colonoscopy, flexible; with removal of                         tumor(s), polyp(s), or other lesion(s) by snare                         technique Diagnosis Code(s):     --- Professional ---                        K64.4, Residual hemorrhoidal skin tags                         K63.5, Polyp of colon                        K92.1, Melena (includes Hematochezia)                        K57.30, Diverticulosis of large intestine without                         perforation or abscess without bleeding CPT copyright 2019 American Medical Association. All rights reserved. The codes documented in this report are preliminary and upon coder review may  be revised to meet current compliance requirements. Dr. Ulyess Mort Lin Landsman MD, MD 03/20/2021 1:56:54 PM This report has been signed electronically. Number of Addenda: 0 Note Initiated On: 03/20/2021 1:29 PM Scope Withdrawal Time: 0 hours 10 minutes 15 seconds  Total Procedure Duration: 0 hours 15 minutes 18 seconds  Estimated Blood Loss:  Estimated blood loss: none.      [facility]

## 2021-03-20 NOTE — Care Management Important Message (Signed)
Important Message  Patient Details  Name: ZHYON ANTENUCCI MRN: [id number] Date of Birth: 1946-08-29   Medicare Important Message Given:  Yes     Dannette Barbara 03/20/2021, 1:36 PM

## 2021-03-20 NOTE — Anesthesia Postprocedure Evaluation (Signed)
Anesthesia Post Note  Patient: Garrett Hunter  Procedure(s) Performed: COLONOSCOPY WITH PROPOFOL  Patient location during evaluation: Phase II Anesthesia Type: General Level of consciousness: awake and alert, awake and oriented Pain management: pain level controlled Vital Signs Assessment: post-procedure vital signs reviewed and stable Respiratory status: spontaneous breathing, nonlabored ventilation and respiratory function stable Cardiovascular status: blood pressure returned to baseline and stable Postop Assessment: no apparent nausea or vomiting Anesthetic complications: no   No notable events documented.   Last Vitals:  Vitals:   03/20/21 1417 03/20/21 1427  BP: 118/65 (!) 155/67  Pulse: 64 60  Resp: 17 14  Temp:    SpO2: 99% 99%    Last Pain:  Vitals:   03/20/21 1427  TempSrc:   PainSc: 0-No pain                 Phill Mutter

## 2021-03-20 NOTE — Anesthesia Procedure Notes (Signed)
Date/Time: 03/20/2021 1:40 PM Performed by: Vaughan Sine Pre-anesthesia Checklist: Patient identified, Emergency Drugs available, Suction available, Patient being monitored and Timeout performed Patient Re-evaluated:Patient Re-evaluated prior to induction Oxygen Delivery Method: Simple face mask Preoxygenation: Pre-oxygenation with 100% oxygen Induction Type: IV induction Placement Confirmation: positive ETCO2 and CO2 detector

## 2021-03-20 NOTE — Anesthesia Preprocedure Evaluation (Signed)
Anesthesia Evaluation  Patient identified by MRN, date of birth, ID band Patient awake    Reviewed: Allergy & Precautions, NPO status , Patient's Chart, lab work & pertinent test results, reviewed documented beta blocker date and time   Airway Mallampati: II  TM Distance: >3 FB Neck ROM: Full    Dental  (+) Teeth Intact, Dental Advisory Given   Pulmonary COPD, former smoker,    breath sounds clear to auscultation       Cardiovascular hypertension, Pt. on medications and Pt. on home beta blockers + CAD, + Past MI, + Cardiac Stents, + Peripheral Vascular Disease and +CHF   Rhythm:Regular Rate:Normal  Cardiac cath 01/28/21: Conclusions: 1.  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  There are minimal luminal irregularities in the LAD without significant stenosis. 2.  Widely patent ramus intermedius stent. 3.  Moderately elevated left ventricular filling pressure. Recommendations: 1.  Continue IV amiodarone and ICU monitoring in the setting of sustained ventricular tachycardia that is likely scar mediated. 2.  Gentle diuresis. 3.  Aggressive secondary prevention of coronary artery disease. 4.  EP consultation for further management of VT.   Echo 01/28/21: IMPRESSIONS   1. Left ventricular ejection fraction, by estimation, is 45 to 50%. The  left ventricle has mildly decreased function. The left ventricle  demonstrates global hypokinesis. There is mild left ventricular  hypertrophy. Left ventricular diastolic parameters  are consistent with Grade II diastolic dysfunction (pseudonormalization).  There is akinesis of the left ventricular, basal inferior segment.  2. Right ventricular systolic function is normal. The right ventricular  size is normal.  3. Left atrial size was mildly dilated.  4. The mitral valve is grossly normal. Trivial mitral valve  regurgitation. No  evidence of mitral stenosis.  5. The aortic valve was not well visualized. Aortic valve regurgitation  is not visualized. No aortic stenosis is present.  6. Aortic dilatation noted. There is mild dilatation of the aortic root,  measuring 39 mm.     Neuro/Psych CVA, Residual Symptoms    GI/Hepatic PUD, (+) Hepatitis -, Unspecified  Endo/Other  diabetes, Well Controlled  Renal/GU Renal InsufficiencyRenal disease     Musculoskeletal   Abdominal   Peds  Hematology   Anesthesia Other Findings Patient with moderate expressive aphasia  Coronary artery disease   Hyperlipidemia   AAA (abdominal aortic aneurysm) (HCC)   Chronic systolic heart failure (HCC)   Erectile dysfunction   GIB (gastrointestinal bleeding)   Primary hypertension   Pure hypercholesterolemia   Peptic ulcer disease   Duodenal ulcer disease   Sustained VT (ventricular tachycardia) (HCC)   AKI (acute kidney injury) (HCC)   Carotid stenosis   CKD (chronic kidney disease) stage 3, GFR 30-59 ml/min (HCC)   Hypokalemia  Left ventricular ejection fraction, by estimation, is 45 to 50%. The  left ventricle has mildly decreased function. The left ventricle  demonstrates global hypokinesis. There is mild left ventricular  hypertrophy. Left ventricular diastolic parameters  are consistent with Grade II diastolic dysfunction (pseudonormalization).  There is akinesis of the left ventricular, basal inferior segment.   He had balloon angioplasty done. 01/28/21 cath   Reproductive/Obstetrics                             Anesthesia Physical  Anesthesia Plan  ASA: 4  Anesthesia Plan: General   Post-op Pain Management:    Induction: Intravenous  PONV Risk Score and Plan: 2 and TIVA and Propofol infusion  Airway Management Planned: Natural Airway and Nasal Cannula  Additional Equipment:   Intra-op Plan:   Post-operative Plan: Extubation in OR  Informed Consent: I have reviewed  the patients History and Physical, chart, labs and discussed the procedure including the risks, benefits and alternatives for the proposed anesthesia with the patient or authorized representative who has indicated his/her understanding and acceptance.     Dental advisory given  Plan Discussed with: CRNA, Anesthesiologist and Surgeon  Anesthesia Plan Comments: (PAT note written 02/06/2021 by Myra Gianotti, PA-C.  Recent admission for VT, s/p cardioversion 01/27/21. Acute CVA 01/30/21. On 02/02/21 EP Dr. Curt Bears wrote, "Acute stroke: Found to have carotid stenosis. Has plans for carotid artery surgery this coming Friday. At this point, his surgery seems somewhat urgent. He would be intermediate to high risk for this intermediate to high risk procedure, though he is fairly optimized with his medications, and the most recent catheterization without evidence of new or worsening coronary artery disease. No further cardiology work-up is needed prior to surgery." )        Anesthesia Quick Evaluation

## 2021-03-21 ENCOUNTER — Encounter: Payer: Self-pay | Admitting: Gastroenterology

## 2021-03-21 SURGERY — IMAGING PROCEDURE, GI TRACT, INTRALUMINAL, VIA CAPSULE

## 2021-03-24 ENCOUNTER — Telehealth: Payer: Self-pay | Admitting: Gastroenterology

## 2021-03-24 ENCOUNTER — Encounter: Payer: Medicare Other | Admitting: Speech Pathology

## 2021-03-24 ENCOUNTER — Encounter: Payer: Self-pay | Admitting: Gastroenterology

## 2021-03-24 LAB — SURGICAL PATHOLOGY

## 2021-03-24 NOTE — Telephone Encounter (Signed)
LVM for patient to call back to schedule 3 month f/u per Dr Marius Ditch

## 2021-03-26 ENCOUNTER — Encounter: Payer: Medicare Other | Admitting: Speech Pathology

## 2021-04-01 ENCOUNTER — Encounter: Payer: Medicare Other | Admitting: Speech Pathology

## 2021-04-01 NOTE — Addendum Note (Signed)
Addendum  created 04/01/21 1415 by Lerry Liner, CRNA   Intraprocedure Event edited

## 2021-04-02 ENCOUNTER — Encounter: Payer: Self-pay | Admitting: Physician Assistant

## 2021-04-02 ENCOUNTER — Other Ambulatory Visit: Payer: Self-pay | Admitting: Medical

## 2021-04-02 ENCOUNTER — Telehealth: Payer: Self-pay | Admitting: Cardiology

## 2021-04-02 DIAGNOSIS — R002 Palpitations: Secondary | ICD-10-CM

## 2021-04-02 DIAGNOSIS — I6330 Cerebral infarction due to thrombosis of unspecified cerebral artery: Secondary | ICD-10-CM

## 2021-04-02 DIAGNOSIS — I472 Ventricular tachycardia, unspecified: Secondary | ICD-10-CM

## 2021-04-02 DIAGNOSIS — I4729 Other ventricular tachycardia: Secondary | ICD-10-CM

## 2021-04-02 MED ORDER — AMIODARONE HCL 200 MG PO TABS: 200.0000 mg | ORAL_TABLET | Freq: Every day | ORAL | 3 refills | Status: DC

## 2021-04-02 NOTE — Telephone Encounter (Signed)
Discussed with Christell Faith.  Will continue Amiodarone 200 mg one tablet by mouth daily.  Pt notified.  Prescription sent to pharmacy.

## 2021-04-02 NOTE — Progress Notes (Signed)
    Received call from EP staff as patient had contacted them with questions regarding amiodarone.  In short, this is a 75 y.o. 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  Last cardiology rounding note indicates the patient is to continue amiodarone 200 mg daily.  However, the following day, when the patient was discharged from the hospitalist service, the discharge summary indicates he should "ask your doctor about these medications," and now the patient needs a refill as his prior prescription has run out.  Recommendations: -Patient should continue amiodarone 200 mg daily

## 2021-04-02 NOTE — Telephone Encounter (Signed)
*  STAT* If patient is at the pharmacy, call can be transferred to refill team.   1. Which medications need to be refilled? (please list name of each medication and dose if known)  amiodarone (PACERONE) 200 MG tablet  2. Which pharmacy/location (including street and city if local pharmacy) is medication to be sent to? [id number] [place], [place] [address] [place]  3. Do they need a 30 day or 90 day supply? 30 with refills   Patient is out of medication

## 2021-04-02 NOTE — Telephone Encounter (Signed)
Looks like patient was seen in the hospital.  Would Quentin Ore like to keep him on Amiodarone?

## 2021-04-03 ENCOUNTER — Encounter: Payer: Medicare Other | Admitting: Speech Pathology

## 2021-04-03 NOTE — Telephone Encounter (Signed)
This is a [place] pt 

## 2021-04-03 NOTE — Telephone Encounter (Signed)
*  STAT* If patient is at the pharmacy, call can be transferred to refill team.   1. Which medications need to be refilled? (please list name of each medication and dose if known) amiodarone (PACERONE) 200 MG tablet  2. Which pharmacy/location (including street and city if local pharmacy) is medication to be sent to? [id number] [place], [place]  3. Do they need a 30 day or 90 day supply? 90 day supply    Prescription was sent to the wrong pharmacy. Pt is completely out of medication.

## 2021-04-04 MED ORDER — AMIODARONE HCL 200 MG PO TABS: 200.0000 mg | ORAL_TABLET | Freq: Every day | ORAL | 0 refills | Status: DC

## 2021-04-04 NOTE — Telephone Encounter (Signed)
Requested Prescriptions   Signed Prescriptions Disp Refills   amiodarone (PACERONE) 200 MG tablet 90 tablet 0    Sig: Take 1 tablet (200 mg total) by mouth daily.    Authorizing Provider: Lars Mage T    Ordering User: Raelene Bott, Redell Nazir L   Patient has been informed that [facility] [place]

## 2021-04-04 NOTE — Telephone Encounter (Signed)
*  STAT* If patient is at the pharmacy, call can be transferred to refill team.   1. Which medications need to be refilled? (please list name of each medication and dose if known)  amiodarone (PACERONE) 200 MG tablet  2. Which pharmacy/location (including street and city if local pharmacy) is medication to be sent to? [id number] [place], [place]  3. Do they need a 30 day or 90 day supply?  90 day supply  Patient's wife states the patient is completely out of medication. Please send to pharmacy listed above and contact patient with confirmation.

## 2021-04-04 NOTE — Addendum Note (Signed)
Addended by: Raelene Bott, Laveta Gilkey L on: 04/04/2021 10:26 AM   Modules accepted: Orders

## 2021-04-22 NOTE — H&P (View-Only) (Signed)
Electrophysiology Office Follow up Visit Note:    Date:  04/23/2021   ID:  Garrett Hunter, DOB 07-01-1946, MRN [id number]  PCP:  Derinda Late, MD   [facility] Electrophysiologist:  [name], MD    Interval History:    Garrett Hunter is a 75 y.o. male who presents for a follow up visit. They were last seen in the hospital by Dr. Curt Bears on January 29, 2021.  The patient has a history of coronary artery disease, AAA post endovascular repair in 2018, hypertension, hyperlipidemia and ischemic cardiomyopathy.  When the patient was admitted in April, EP was consulted for ventricular tachycardia.  The ventricular tachycardia occurred while the patient was in his usual state of health working in the yard and suddenly had onset of palpitations and chest discomfort.  He ultimately called 911 and was found to be in a wide-complex tachycardia.  He was treated with amiodarone.  He was ultimately cardioverted to sinus rhythm in the emergency department and started on heparin and amiodarone drips.  Patient had an echocardiogram which showed an ejection fraction of 45 to 50%.  Left heart catheterization shows a CTO of the right coronary artery and left circumflex.  The patient is also had recent hospitalization for GI bleed related to an ulcer.  He was taken off of aspirin.  He continues to take Plavix.  His blood counts are improving slowly.  Today he tells me that when he had his ventricular tachycardia event he felt lightheaded and was on the verge of passing out.  He never actually lost consciousness.    Past Medical History:  Diagnosis Date   AAA (abdominal aortic aneurysm) (HCC)    thoracoabdominal aortic aneursym, s/p right ilio-femoral bypass 10/12/16; 3V FEVAR with SMA, RRA, and LRA stenting 11/16/16 by Dr. Sammuel Hines [facility])   Benign neoplasm of colon    patient not sure   CHF (congestive heart failure) (HCC)    EF 45-50%   Chronic airway obstruction, not elsewhere classified     pt denies   Coronary artery disease    Inferior MI in 1996 treated with TPA. Cardiac cath in 2011 at [facility] showed chronic occlusion of the proximal RCA and proximal left circumflex without obstructive disease in the LAD. Non-ST elevation myocardial infarction in October 2013 while on vacation in [place]. Cardiac catheterization showed plaque rupture in the proximal ramus with thrombus. He had balloon angioplasty done. 01/28/21 cath    Coronary atherosclerosis of native coronary artery    Hepatitis A    teenager, no longer a problem   Hyperlipidemia    Intolerance to statins.   Hypertension    MI (myocardial infarction) ([place])    x 3 - last one 2013   Renal artery stenosis (HCC)    s/p bilateral renal artery angioplasty 07/26/18   Stroke ([place]) 01/2021   Ventricular tachycardia ([place])    s/p cardioverson 01/27/21    Past Surgical History:  Procedure Laterality Date   ABDOMINAL AORTIC ANEURYSM REPAIR     11/16/2016   ANGIOPLASTY     CARDIAC CATHETERIZATION  2011   CARDIAC CATHETERIZATION  2/14   [facility]; 95% lesion in the ramus intermedius.    CATARACT EXTRACTION W/PHACO Left 12/19/2018   Procedure: CATARACT EXTRACTION PHACO AND INTRAOCULAR LENS PLACEMENT ([place])  LEFT;  Surgeon: Eulogio Bear, MD;  Location: [place];  Service: Ophthalmology;  Laterality: Left;   CATARACT EXTRACTION W/PHACO Right 03/06/2019   Procedure: CATARACT EXTRACTION PHACO AND INTRAOCULAR LENS  PLACEMENT (IOC)RIGHT;  Surgeon: Eulogio Bear, MD;  Location: [place];  Service: Ophthalmology;  Laterality: Right;   COLONOSCOPY WITH PROPOFOL N/A 03/20/2021   Procedure: COLONOSCOPY WITH PROPOFOL;  Surgeon: Lin Landsman, MD;  Location: [facility] ENDOSCOPY;  Service: Gastroenterology;  Laterality: N/A;   CORONARY ANGIOPLASTY  1996   [facility] x1   CORONARY ANGIOPLASTY  2/14   Severe restenosis in the ostial ramus. Status post angioplasty and drug-eluting stent placement with a 2.5 x 18 mm Xience  drug-eluting stent   ESOPHAGOGASTRODUODENOSCOPY N/A 08/03/2018   Procedure: ESOPHAGOGASTRODUODENOSCOPY (EGD);  Surgeon: Lin Landsman, MD;  Location: [facility] ENDOSCOPY;  Service: Gastroenterology;  Laterality: N/A;   ESOPHAGOGASTRODUODENOSCOPY (EGD) WITH PROPOFOL N/A 11/03/2018   Procedure: ESOPHAGOGASTRODUODENOSCOPY (EGD) WITH PROPOFOL;  Surgeon: Lin Landsman, MD;  [facility];  Service: Gastroenterology;  Laterality: N/A;   ESOPHAGOGASTRODUODENOSCOPY (EGD) WITH PROPOFOL N/A 12/06/2018   Procedure: ESOPHAGOGASTRODUODENOSCOPY (EGD) WITH PROPOFOL;  Surgeon: Lucilla Lame, MD;  Location: [facility] ENDOSCOPY;  Service: Endoscopy;  Laterality: N/A;   ESOPHAGOGASTRODUODENOSCOPY (EGD) WITH PROPOFOL N/A 03/19/2021   Procedure: ESOPHAGOGASTRODUODENOSCOPY (EGD) WITH PROPOFOL;  Surgeon: Lin Landsman, MD;  Location: [facility] ENDOSCOPY;  Service: Gastroenterology;  Laterality: N/A;   IR CT HEAD LTD  01/30/2021   IR PERCUTANEOUS ART THROMBECTOMY/INFUSION INTRACRANIAL INC DIAG ANGIO  01/30/2021   LEFT HEART CATH AND CORONARY ANGIOGRAPHY N/A 01/28/2021   Procedure: LEFT HEART CATH AND CORONARY ANGIOGRAPHY;  Surgeon: Nelva Bush, MD;  Location: [place] CV LAB;  Service: Cardiovascular;  Laterality: N/A;   RADIOLOGY WITH ANESTHESIA N/A 01/30/2021   Procedure: IR WITH ANESTHESIA;  Surgeon: Luanne Bras, MD;  Location: [place];  Service: Radiology;  Laterality: N/A;   TONSILLECTOMY AND ADENOIDECTOMY     TRANSCAROTID ARTERY REVASCULARIZATION  Left 02/07/2021   Procedure: TRANSCAROTID ARTERY REVASCULARIZATION LEFT;  Surgeon: [name], MD;  Location: [facility] OR;  Service: Vascular;  Laterality: Left;    Current Medications: Current Meds  Medication Sig   amiodarone (PACERONE) 200 MG tablet Take 1 tablet (200 mg total) by mouth daily.   carvedilol (COREG) 12.5 MG tablet TAKE 1 TABLET TWICE A DAY   clopidogrel (PLAVIX) 75 MG tablet Take 1 tablet (75 mg total) by mouth daily.   Coenzyme  Q10 (COQ10) 100 MG CAPS Take 1 capsule by mouth every evening.   lisinopril-hydrochlorothiazide (ZESTORETIC) 20-12.5 MG tablet Take 2 tablets by mouth daily.   Multiple Vitamin (MULTIVITAMIN WITH MINERALS) TABS tablet Take 1 tablet by mouth daily.   pantoprazole (PROTONIX) 40 MG tablet Take 1 tablet (40 mg total) by mouth 2 (two) times daily.   rosuvastatin (CRESTOR) 5 MG tablet TAKE 1 TABLET DAILY   vitamin C (ASCORBIC ACID) 500 MG tablet Take 500 mg by mouth daily.     Allergies:   Crestor [rosuvastatin], Livalo [pitavastatin], Pravachol [pravastatin sodium], and Zocor [simvastatin]   Social History   Socioeconomic History   Marital status: Married    Spouse name: Not on file   Number of children: 4   Years of education: Not on file   Highest education level: Not on file  Occupational History   Not on file  Tobacco Use   Smoking status: Former    Packs/day: 2.00    Years: 40.00    Pack years: 80.00    Types: Cigarettes    Quit date: 2004    Years since quitting: 18.5   Smokeless tobacco: Former    Types: Nurse, children's Use:  Never used  Substance and Sexual Activity   Alcohol use: Not Currently    Alcohol/week: 3.0 standard drinks    Types: 3 Cans of beer per week    Comment: occassionally   Drug use: No   Sexual activity: Yes  Other Topics Concern   Not on file  Social History Narrative   Lives at home with wife, works   Investment banker, operational of Radio broadcast assistant Strain: Not on file  Food Insecurity: Not on file  Transportation Needs: Not on file  Physical Activity: Not on file  Stress: Not on file  Social Connections: Not on file     Family History: The patient's family history includes Cancer (age of onset: 29) in his mother; Heart attack in his father; Hypertension in his brother and sister.  ROS:   Please see the history of present illness.    All other systems reviewed and are negative.  EKGs/Labs/Other Studies Reviewed:    The  following studies were reviewed today:  January 27, 2021 EKG Ventricular tachycardia   March 16, 2021 EKG personally reviewed Sinus rhythm Nonspecific interventricular conduction delay   April 02, 2021 ZIO personally reviewed HR 50 - 95, average 63 bpm. 4 min episode labeled AF by algorithm difficult to interpret given significant artifact. 1 9 beat run of NSVT.    January 28, 2021 echo Left ventricular function mildly reduced, 45% Right ventricular function normal   EKG:  The ekg ordered today demonstrates sinus rhythm with a first-degree AV delay.  PVC.  Recent Labs: 03/16/2021: ALT 16 03/17/2021: Magnesium 2.0 03/20/2021: BUN 9; Creatinine, Ser 1.19; Hemoglobin 9.3; Platelets 223; Potassium 4.1; Sodium 140  Recent Lipid Panel    Component Value Date/Time   CHOL 119 01/30/2021 0530   CHOL 121 09/13/2012 0819   TRIG 85 01/30/2021 0530   HDL 38 (L) 01/30/2021 0530   HDL 37 (L) 09/13/2012 0819   CHOLHDL 3.1 01/30/2021 0530   VLDL 17 01/30/2021 0530   LDLCALC 64 01/30/2021 0530   LDLCALC 68 09/13/2012 0819    Physical Exam:    VS:  BP 120/70 (BP Location: Left Arm, Patient Position: Sitting, Cuff Size: Normal)   Pulse 60   Ht 5\' 11"  (1.803 m)   Wt 207 lb (93.9 kg)   SpO2 97%   BMI 28.87 kg/m     Wt Readings from Last 3 Encounters:  04/23/21 207 lb (93.9 kg)  03/19/21 209 lb (94.8 kg)  03/10/21 209 lb (94.8 kg)     GEN:  Well nourished, well developed in no acute distress HEENT: Normal NECK: No JVD; No carotid bruits LYMPHATICS: No lymphadenopathy CARDIAC: RRR, no murmurs, rubs, gallops RESPIRATORY:  Clear to auscultation without rales, wheezing or rhonchi  ABDOMEN: Soft, non-tender, non-distended MUSCULOSKELETAL:  No edema; No deformity  SKIN: Warm and dry NEUROLOGIC:  Alert and oriented x 3.  Intermittent word finding difficulties PSYCHIATRIC:  Normal affect   ASSESSMENT:    1. VT (ventricular tachycardia) (HCC)   2. Chronic systolic congestive heart  failure ([place])   3. Primary hypertension   4. Cerebral thrombosis with cerebral infarction   5. Gastrointestinal hemorrhage associated with gastric ulcer    PLAN:    In order of problems listed above:  1. VT (ventricular tachycardia) (HCC) Hemodynamically significant ventricular tachycardia.  No recurrence since starting amiodarone.  In the background he has ischemic cardiomyopathy with severe coronary artery disease.  I discussed using a defibrillator for secondary prophylaxis against ventricular tachycardia's  and he wishes to proceed.  I will plan for a Biotronik VDD device.  The patient has an  ischemic CM (EF 45%), NYHA Class III CHF, and CAD.  He is referred by Dr Curt Bears for risk stratification of sudden death and consideration of ICD implantation.  At this time, he meets criteria for ICD implantation for secondary prevention of sudden death.  I have had a thorough discussion with the patient reviewing options.  The patient and their family (if available) have had opportunities to ask questions and have them answered. The patient and I have decided together through a shared decision making process to proceed with implanting an ICD at this time.   Risks, benefits, alternatives to ICD implantation were discussed in detail with the patient today. The patient understands that the risks include but are not limited to bleeding, infection, pneumothorax, perforation, tamponade, vascular damage, renal failure, MI, stroke, death, inappropriate shocks, and lead dislodgement and wishes to proceed.  We will therefore schedule device implantation at the next available time.    2. Chronic systolic congestive heart failure (HCC) NYHA class II.  Warm and dry on exam.  Continue Coreg, lisinopril, hydrochlorothiazide and Crestor.  3. Primary hypertension Controlled.  Continue above regimen.   4. Cerebral thrombosis with cerebral infarction Continue rosuvastatin and Plavix.  5. Gastrointestinal hemorrhage  associated with gastric ulcer Blood counts are improving.  Continue Plavix.       Total time spent with patient today 45 minutes. This includes reviewing records, evaluating the patient and coordinating care.   Medication Adjustments/Labs and Tests Ordered: Current medicines are reviewed at length with the patient today.  Concerns regarding medicines are outlined above.  Orders Placed This Encounter  Procedures   EKG 12-Lead    No orders of the defined types were placed in this encounter.    Signed, Lars Mage, MD, [facility], [facility] 04/23/2021 10:33 AM    Electrophysiology [facility]

## 2021-04-22 NOTE — Progress Notes (Signed)
Electrophysiology Office Follow up Visit Note:    Date:  04/23/2021   ID:  Garrett Hunter, DOB 05/03/1946, MRN [id number]  PCP:  Derinda Late, MD   [facility] Electrophysiologist:  [name], MD    Interval History:    Garrett Hunter is a 75 y.o. male who presents for a follow up visit. They were last seen in the hospital by Dr. Curt Bears on January 29, 2021.  The patient has a history of coronary artery disease, AAA post endovascular repair in 2018, hypertension, hyperlipidemia and ischemic cardiomyopathy.  When the patient was admitted in April, EP was consulted for ventricular tachycardia.  The ventricular tachycardia occurred while the patient was in his usual state of health working in the yard and suddenly had onset of palpitations and chest discomfort.  He ultimately called 911 and was found to be in a wide-complex tachycardia.  He was treated with amiodarone.  He was ultimately cardioverted to sinus rhythm in the emergency department and started on heparin and amiodarone drips.  Patient had an echocardiogram which showed an ejection fraction of 45 to 50%.  Left heart catheterization shows a CTO of the right coronary artery and left circumflex.  The patient is also had recent hospitalization for GI bleed related to an ulcer.  He was taken off of aspirin.  He continues to take Plavix.  His blood counts are improving slowly.  Today he tells me that when he had his ventricular tachycardia event he felt lightheaded and was on the verge of passing out.  He never actually lost consciousness.    Past Medical History:  Diagnosis Date   AAA (abdominal aortic aneurysm) (HCC)    thoracoabdominal aortic aneursym, s/p right ilio-femoral bypass 10/12/16; 3V FEVAR with SMA, RRA, and LRA stenting 11/16/16 by Dr. Sammuel Hines [facility])   Benign neoplasm of colon    patient not sure   CHF (congestive heart failure) (HCC)    EF 45-50%   Chronic airway obstruction, not elsewhere classified     pt denies   Coronary artery disease    Inferior MI in 1996 treated with TPA. Cardiac cath in 2011 at [facility] showed chronic occlusion of the proximal RCA and proximal left circumflex without obstructive disease in the LAD. Non-ST elevation myocardial infarction in October 2013 while on vacation in [place]. Cardiac catheterization showed plaque rupture in the proximal ramus with thrombus. He had balloon angioplasty done. 01/28/21 cath    Coronary atherosclerosis of native coronary artery    Hepatitis A    teenager, no longer a problem   Hyperlipidemia    Intolerance to statins.   Hypertension    MI (myocardial infarction) ([place])    x 3 - last one 2013   Renal artery stenosis (HCC)    s/p bilateral renal artery angioplasty 07/26/18   Stroke ([place]) 01/2021   Ventricular tachycardia ([place])    s/p cardioverson 01/27/21    Past Surgical History:  Procedure Laterality Date   ABDOMINAL AORTIC ANEURYSM REPAIR     11/16/2016   ANGIOPLASTY     CARDIAC CATHETERIZATION  2011   CARDIAC CATHETERIZATION  2/14   [facility]; 95% lesion in the ramus intermedius.    CATARACT EXTRACTION W/PHACO Left 12/19/2018   Procedure: CATARACT EXTRACTION PHACO AND INTRAOCULAR LENS PLACEMENT ([place])  LEFT;  Surgeon: Eulogio Bear, MD;  Location: [place];  Service: Ophthalmology;  Laterality: Left;   CATARACT EXTRACTION W/PHACO Right 03/06/2019   Procedure: CATARACT EXTRACTION PHACO AND INTRAOCULAR LENS  PLACEMENT (IOC)RIGHT;  Surgeon: Eulogio Bear, MD;  Location: [place];  Service: Ophthalmology;  Laterality: Right;   COLONOSCOPY WITH PROPOFOL N/A 03/20/2021   Procedure: COLONOSCOPY WITH PROPOFOL;  Surgeon: Lin Landsman, MD;  Location: [facility] ENDOSCOPY;  Service: Gastroenterology;  Laterality: N/A;   CORONARY ANGIOPLASTY  1996   [facility] x1   CORONARY ANGIOPLASTY  2/14   Severe restenosis in the ostial ramus. Status post angioplasty and drug-eluting stent placement with a 2.5 x 18 mm Xience  drug-eluting stent   ESOPHAGOGASTRODUODENOSCOPY N/A 08/03/2018   Procedure: ESOPHAGOGASTRODUODENOSCOPY (EGD);  Surgeon: Lin Landsman, MD;  Location: [facility] ENDOSCOPY;  Service: Gastroenterology;  Laterality: N/A;   ESOPHAGOGASTRODUODENOSCOPY (EGD) WITH PROPOFOL N/A 11/03/2018   Procedure: ESOPHAGOGASTRODUODENOSCOPY (EGD) WITH PROPOFOL;  Surgeon: Lin Landsman, MD;  Location: [facility] ENDOSCOPY;  Service: Gastroenterology;  Laterality: N/A;   ESOPHAGOGASTRODUODENOSCOPY (EGD) WITH PROPOFOL N/A 12/06/2018   Procedure: ESOPHAGOGASTRODUODENOSCOPY (EGD) WITH PROPOFOL;  Surgeon: Lucilla Lame, MD;  Location: [facility] ENDOSCOPY;  Service: Endoscopy;  Laterality: N/A;   ESOPHAGOGASTRODUODENOSCOPY (EGD) WITH PROPOFOL N/A 03/19/2021   Procedure: ESOPHAGOGASTRODUODENOSCOPY (EGD) WITH PROPOFOL;  Surgeon: Lin Landsman, MD;  Location: [facility] ENDOSCOPY;  Service: Gastroenterology;  Laterality: N/A;   IR CT HEAD LTD  01/30/2021   IR PERCUTANEOUS ART THROMBECTOMY/INFUSION INTRACRANIAL INC DIAG ANGIO  01/30/2021   LEFT HEART CATH AND CORONARY ANGIOGRAPHY N/A 01/28/2021   Procedure: LEFT HEART CATH AND CORONARY ANGIOGRAPHY;  Surgeon: Nelva Bush, MD;  Location: [place] [facility];  Service: Cardiovascular;  Laterality: N/A;   RADIOLOGY WITH ANESTHESIA N/A 01/30/2021   Procedure: IR WITH ANESTHESIA;  Surgeon: Luanne Bras, MD;  Location: [place];  Service: Radiology;  Laterality: N/A;   TONSILLECTOMY AND ADENOIDECTOMY     TRANSCAROTID ARTERY REVASCULARIZATION  Left 02/07/2021   Procedure: TRANSCAROTID ARTERY REVASCULARIZATION LEFT;  Surgeon: [name], MD;  Location: [facility] OR;  Service: Vascular;  Laterality: Left;    Current Medications: Current Meds  Medication Sig   amiodarone (PACERONE) 200 MG tablet Take 1 tablet (200 mg total) by mouth daily.   carvedilol (COREG) 12.5 MG tablet TAKE 1 TABLET TWICE A DAY   clopidogrel (PLAVIX) 75 MG tablet Take 1 tablet (75 mg total) by mouth daily.   Coenzyme  Q10 (COQ10) 100 MG CAPS Take 1 capsule by mouth every evening.   lisinopril-hydrochlorothiazide (ZESTORETIC) 20-12.5 MG tablet Take 2 tablets by mouth daily.   Multiple Vitamin (MULTIVITAMIN WITH MINERALS) TABS tablet Take 1 tablet by mouth daily.   pantoprazole (PROTONIX) 40 MG tablet Take 1 tablet (40 mg total) by mouth 2 (two) times daily.   rosuvastatin (CRESTOR) 5 MG tablet TAKE 1 TABLET DAILY   vitamin C (ASCORBIC ACID) 500 MG tablet Take 500 mg by mouth daily.     Allergies:   Crestor [rosuvastatin], Livalo [pitavastatin], Pravachol [pravastatin sodium], and Zocor [simvastatin]   Social History   Socioeconomic History   Marital status: Married    Spouse name: Not on file   Number of children: 4   Years of education: Not on file   Highest education level: Not on file  Occupational History   Not on file  Tobacco Use   Smoking status: Former    Packs/day: 2.00    Years: 40.00    Pack years: 80.00    Types: Cigarettes    Quit date: 2004    Years since quitting: 18.5   Smokeless tobacco: Former    Types: Nurse, children's Use:  Never used  Substance and Sexual Activity   Alcohol use: Not Currently    Alcohol/week: 3.0 standard drinks    Types: 3 Cans of beer per week    Comment: occassionally   Drug use: No   Sexual activity: Yes  Other Topics Concern   Not on file  Social History Narrative   Lives at home with wife, works   Investment banker, operational of Radio broadcast assistant Strain: Not on file  Food Insecurity: Not on file  Transportation Needs: Not on file  Physical Activity: Not on file  Stress: Not on file  Social Connections: Not on file     Family History: The patient's family history includes Cancer (age of onset: 73) in his mother; Heart attack in his father; Hypertension in his brother and sister.  ROS:   Please see the history of present illness.    All other systems reviewed and are negative.  EKGs/Labs/Other Studies Reviewed:    The  following studies were reviewed today:  January 27, 2021 EKG Ventricular tachycardia   March 16, 2021 EKG personally reviewed Sinus rhythm Nonspecific interventricular conduction delay   April 02, 2021 ZIO personally reviewed HR 50 - 95, average 63 bpm. 4 min episode labeled AF by algorithm difficult to interpret given significant artifact. 1 9 beat run of NSVT.    January 28, 2021 echo Left ventricular function mildly reduced, 45% Right ventricular function normal   EKG:  The ekg ordered today demonstrates sinus rhythm with a first-degree AV delay.  PVC.  Recent Labs: 03/16/2021: ALT 16 03/17/2021: Magnesium 2.0 03/20/2021: BUN 9; Creatinine, Ser 1.19; Hemoglobin 9.3; Platelets 223; Potassium 4.1; Sodium 140  Recent Lipid Panel    Component Value Date/Time   CHOL 119 01/30/2021 0530   CHOL 121 09/13/2012 0819   TRIG 85 01/30/2021 0530   HDL 38 (L) 01/30/2021 0530   HDL 37 (L) 09/13/2012 0819   CHOLHDL 3.1 01/30/2021 0530   VLDL 17 01/30/2021 0530   LDLCALC 64 01/30/2021 0530   LDLCALC 68 09/13/2012 0819    Physical Exam:    VS:  BP 120/70 (BP Location: Left Arm, Patient Position: Sitting, Cuff Size: Normal)   Pulse 60   Ht 5\' 11"  (1.803 m)   Wt 207 lb (93.9 kg)   SpO2 97%   BMI 28.87 kg/m     Wt Readings from Last 3 Encounters:  04/23/21 207 lb (93.9 kg)  03/19/21 209 lb (94.8 kg)  03/10/21 209 lb (94.8 kg)     GEN:  Well nourished, well developed in no acute distress HEENT: Normal NECK: No JVD; No carotid bruits LYMPHATICS: No lymphadenopathy CARDIAC: RRR, no murmurs, rubs, gallops RESPIRATORY:  Clear to auscultation without rales, wheezing or rhonchi  ABDOMEN: Soft, non-tender, non-distended MUSCULOSKELETAL:  No edema; No deformity  SKIN: Warm and dry NEUROLOGIC:  Alert and oriented x 3.  Intermittent word finding difficulties PSYCHIATRIC:  Normal affect   ASSESSMENT:    1. VT (ventricular tachycardia) (HCC)   2. Chronic systolic congestive heart  failure ([place])   3. Primary hypertension   4. Cerebral thrombosis with cerebral infarction   5. Gastrointestinal hemorrhage associated with gastric ulcer    PLAN:    In order of problems listed above:  1. VT (ventricular tachycardia) (HCC) Hemodynamically significant ventricular tachycardia.  No recurrence since starting amiodarone.  In the background he has ischemic cardiomyopathy with severe coronary artery disease.  I discussed using a defibrillator for secondary prophylaxis against ventricular tachycardia's  and he wishes to proceed.  I will plan for a Biotronik VDD device.  The patient has an  ischemic CM (EF 45%), NYHA Class III CHF, and CAD.  He is referred by Dr Curt Bears for risk stratification of sudden death and consideration of ICD implantation.  At this time, he meets criteria for ICD implantation for secondary prevention of sudden death.  I have had a thorough discussion with the patient reviewing options.  The patient and their family (if available) have had opportunities to ask questions and have them answered. The patient and I have decided together through a shared decision making process to proceed with implanting an ICD at this time.   Risks, benefits, alternatives to ICD implantation were discussed in detail with the patient today. The patient understands that the risks include but are not limited to bleeding, infection, pneumothorax, perforation, tamponade, vascular damage, renal failure, MI, stroke, death, inappropriate shocks, and lead dislodgement and wishes to proceed.  We will therefore schedule device implantation at the next available time.    2. Chronic systolic congestive heart failure (HCC) NYHA class II.  Warm and dry on exam.  Continue Coreg, lisinopril, hydrochlorothiazide and Crestor.  3. Primary hypertension Controlled.  Continue above regimen.   4. Cerebral thrombosis with cerebral infarction Continue rosuvastatin and Plavix.  5. Gastrointestinal hemorrhage  associated with gastric ulcer Blood counts are improving.  Continue Plavix.       Total time spent with patient today 45 minutes. This includes reviewing records, evaluating the patient and coordinating care.   Medication Adjustments/Labs and Tests Ordered: Current medicines are reviewed at length with the patient today.  Concerns regarding medicines are outlined above.  Orders Placed This Encounter  Procedures   EKG 12-Lead    No orders of the defined types were placed in this encounter.    Signed, Lars Mage, MD, [facility], [facility] 04/23/2021 10:33 AM    Electrophysiology [facility]

## 2021-04-23 ENCOUNTER — Other Ambulatory Visit: Payer: Self-pay

## 2021-04-23 ENCOUNTER — Encounter: Payer: Self-pay | Admitting: Cardiology

## 2021-04-23 ENCOUNTER — Ambulatory Visit (INDEPENDENT_AMBULATORY_CARE_PROVIDER_SITE_OTHER): Payer: Medicare Other | Admitting: Cardiology

## 2021-04-23 VITALS — BP 120/70 | HR 60 | Ht 71.0 in | Wt 207.0 lb

## 2021-04-23 DIAGNOSIS — I472 Ventricular tachycardia, unspecified: Secondary | ICD-10-CM

## 2021-04-23 DIAGNOSIS — I10 Essential (primary) hypertension: Secondary | ICD-10-CM | POA: Diagnosis not present

## 2021-04-23 DIAGNOSIS — I6330 Cerebral infarction due to thrombosis of unspecified cerebral artery: Secondary | ICD-10-CM | POA: Diagnosis not present

## 2021-04-23 DIAGNOSIS — I5022 Chronic systolic (congestive) heart failure: Secondary | ICD-10-CM

## 2021-04-23 DIAGNOSIS — K254 Chronic or unspecified gastric ulcer with hemorrhage: Secondary | ICD-10-CM

## 2021-04-23 NOTE — Addendum Note (Signed)
Addended by: Willeen Cass A on: 04/23/2021 11:36 AM   Modules accepted: Orders

## 2021-04-23 NOTE — Patient Instructions (Addendum)
Medication Instructions:  Your physician recommends that you continue on your current medications as directed. Please refer to the Current Medication list given to you today. *If you need a refill on your cardiac medications before your next appointment, please call your pharmacy*  Lab Work: You have had recent lab work per review of [place].  If you have labs (blood work) drawn today and your tests are completely normal, you will receive your results only by: [place] (if you have MyChart) OR A paper copy in the mail If you have any lab test that is abnormal or we need to change your treatment, we will call you to review the results.  Testing/Procedures: Your physician has recommended that you have a defibrillator inserted. An implantable cardioverter defibrillator (ICD) is a small device that is placed in your chest or, in rare cases, your abdomen. This device uses electrical pulses or shocks to help control life-threatening, irregular heartbeats that could lead the heart to suddenly stop beating (sudden cardiac arrest). Leads are attached to the ICD that goes into your heart. This is done in the hospital and usually requires an overnight stay. Please see the instruction sheet given to you today for more information.   Follow-Up: At [facility], you and your health needs are our priority.  As part of our continuing mission to provide you with exceptional heart care, we have created designated Provider Care Teams.  These Care Teams include your primary Cardiologist (physician) and Advanced Practice Providers (APPs -  Physician Assistants and Nurse Practitioners) who all work together to provide you with the care you need, when you need it.  Your next appointment:    SEE INSTRUCTION LETTER

## 2021-05-05 ENCOUNTER — Other Ambulatory Visit: Payer: Self-pay

## 2021-05-05 NOTE — Patient Outreach (Signed)
[facility]) Care Management  05/05/2021  AMOL MEAH [birth year] [id number]   Telephone outreach to patient to obtain mRS was successfully completed. MRS= 1   [place] Care Management Assistant

## 2021-05-08 ENCOUNTER — Telehealth: Payer: Self-pay | Admitting: Cardiology

## 2021-05-08 NOTE — Telephone Encounter (Signed)
New message:     Barnett Applebaum from [facility] calling to get a verbal order for speech. Aug 10, Dr. Quentin Ore will be CATH and need orders for speech by 05/15/21 for week one for skill nurse

## 2021-05-08 NOTE — Telephone Encounter (Signed)
Follow up:     I also so call a few times no answer.

## 2021-05-08 NOTE — Telephone Encounter (Signed)
I called and spoke with Barnett Applebaum from [facility]. She currently sees the patient of ST s/p stroke.  She advised the patient is scheduled for ICD placement with Dr. Quentin Ore on 05/14/21.  She is requesting skilled nursing orders for the patient to follow up with him post implant of his ICD for medical management and disease process.  I advised we typically do not give orders for skilled nursing and inquired who her original orders came from for [facility] to follow the patient.  She advised original orders came from Dr. Harold Barban, and she did follow up with his office for the orders she is requesting now, but they advised her to call Dr. Mardene Speak office as the patient is having cardiac procedure with the ICD.  I advised Barnett Applebaum, I will need to forward to Dr. Quentin Ore and his nurse Sonia Baller to review and we will contact her with MD recommendations.  Gina voices understanding and was appreciative for the call back.

## 2021-05-12 ENCOUNTER — Inpatient Hospital Stay: Payer: Medicare Other | Admitting: Neurology

## 2021-05-12 NOTE — Telephone Encounter (Signed)
Returned call to [employer].  Advised no skilled nursing needed s/p ICD implant.  Barnett Applebaum indicates understanding and thanked nurse for follow up.

## 2021-05-14 ENCOUNTER — Ambulatory Visit
Admission: RE | Admit: 2021-05-14 | Discharge: 2021-05-14 | Disposition: A | Discharge status: Home / Self Care | Payer: Medicare Other | Attending: Cardiology | Admitting: Cardiology

## 2021-05-14 ENCOUNTER — Encounter
Admission: RE | Disposition: A | Discharge status: Home / Self Care | Payer: Self-pay | Source: Home / Self Care | Attending: Cardiology

## 2021-05-14 ENCOUNTER — Ambulatory Visit: Payer: Medicare Other

## 2021-05-14 ENCOUNTER — Encounter: Payer: Self-pay | Admitting: Cardiology

## 2021-05-14 ENCOUNTER — Other Ambulatory Visit: Payer: Self-pay

## 2021-05-14 DIAGNOSIS — I472 Ventricular tachycardia, unspecified: Secondary | ICD-10-CM

## 2021-05-14 DIAGNOSIS — I5022 Chronic systolic (congestive) heart failure: Secondary | ICD-10-CM | POA: Diagnosis not present

## 2021-05-14 DIAGNOSIS — Z8673 Personal history of transient ischemic attack (TIA), and cerebral infarction without residual deficits: Secondary | ICD-10-CM | POA: Diagnosis not present

## 2021-05-14 DIAGNOSIS — I110 Hypertensive heart disease with heart failure: Secondary | ICD-10-CM | POA: Diagnosis not present

## 2021-05-14 DIAGNOSIS — I2510 Atherosclerotic heart disease of native coronary artery without angina pectoris: Secondary | ICD-10-CM | POA: Diagnosis not present

## 2021-05-14 DIAGNOSIS — I252 Old myocardial infarction: Secondary | ICD-10-CM | POA: Insufficient documentation

## 2021-05-14 DIAGNOSIS — Z8619 Personal history of other infectious and parasitic diseases: Secondary | ICD-10-CM | POA: Diagnosis not present

## 2021-05-14 DIAGNOSIS — K254 Chronic or unspecified gastric ulcer with hemorrhage: Secondary | ICD-10-CM | POA: Insufficient documentation

## 2021-05-14 DIAGNOSIS — Z87891 Personal history of nicotine dependence: Secondary | ICD-10-CM | POA: Insufficient documentation

## 2021-05-14 DIAGNOSIS — Z8249 Family history of ischemic heart disease and other diseases of the circulatory system: Secondary | ICD-10-CM | POA: Insufficient documentation

## 2021-05-14 DIAGNOSIS — Z888 Allergy status to other drugs, medicaments and biological substances status: Secondary | ICD-10-CM | POA: Insufficient documentation

## 2021-05-14 DIAGNOSIS — Z79899 Other long term (current) drug therapy: Secondary | ICD-10-CM | POA: Insufficient documentation

## 2021-05-14 DIAGNOSIS — E785 Hyperlipidemia, unspecified: Secondary | ICD-10-CM | POA: Insufficient documentation

## 2021-05-14 DIAGNOSIS — I714 Abdominal aortic aneurysm, without rupture: Secondary | ICD-10-CM | POA: Diagnosis not present

## 2021-05-14 DIAGNOSIS — Z95810 Presence of automatic (implantable) cardiac defibrillator: Secondary | ICD-10-CM

## 2021-05-14 DIAGNOSIS — I6330 Cerebral infarction due to thrombosis of unspecified cerebral artery: Secondary | ICD-10-CM | POA: Diagnosis not present

## 2021-05-14 DIAGNOSIS — Z7902 Long term (current) use of antithrombotics/antiplatelets: Secondary | ICD-10-CM | POA: Insufficient documentation

## 2021-05-14 DIAGNOSIS — I255 Ischemic cardiomyopathy: Secondary | ICD-10-CM

## 2021-05-14 HISTORY — PX: ICD IMPLANT: EP1208

## 2021-05-14 SURGERY — ICD IMPLANT
Anesthesia: Moderate Sedation

## 2021-05-14 MED ORDER — HEPARIN (PORCINE) IN NACL 1000-0.9 UT/500ML-% IV SOLN
INTRAVENOUS | Status: AC
  Filled 2021-05-14: qty 1000

## 2021-05-14 MED ORDER — CLOPIDOGREL BISULFATE 75 MG PO TABS: 75.0000 mg | ORAL_TABLET | Freq: Every day | ORAL | Status: DC

## 2021-05-14 MED ORDER — CEFAZOLIN SODIUM-DEXTROSE 2-4 GM/100ML-% IV SOLN
INTRAVENOUS | Status: AC
  Filled 2021-05-14: qty 100

## 2021-05-14 MED ORDER — MIDAZOLAM HCL 2 MG/2ML IJ SOLN
INTRAMUSCULAR | Status: AC
  Filled 2021-05-14: qty 2

## 2021-05-14 MED ORDER — ONDANSETRON HCL 4 MG/2ML IJ SOLN
4.0000 mg | Freq: Four times a day (QID) | INTRAMUSCULAR | Status: DC | PRN
Start: 2021-05-14 — End: 2021-05-14

## 2021-05-14 MED ORDER — FENTANYL CITRATE (PF) 100 MCG/2ML IJ SOLN
INTRAMUSCULAR | Status: AC
  Filled 2021-05-14: qty 2

## 2021-05-14 MED ORDER — CHLORHEXIDINE GLUCONATE CLOTH 2 % EX PADS: 6.0000 | MEDICATED_PAD | Freq: Every day | CUTANEOUS | Status: DC

## 2021-05-14 MED ORDER — LIDOCAINE HCL (PF) 1 % IJ SOLN
INTRAMUSCULAR | Status: DC | PRN
  Administered 2021-05-14: 30 mL

## 2021-05-14 MED ORDER — POVIDONE-IODINE 10 % EX SWAB: 2.0000 "application " | Freq: Once | CUTANEOUS | Status: DC

## 2021-05-14 MED ORDER — LIDOCAINE HCL 1 % IJ SOLN
INTRAMUSCULAR | Status: AC
  Filled 2021-05-14: qty 40

## 2021-05-14 MED ORDER — LIDOCAINE HCL 1 % IJ SOLN
INTRAMUSCULAR | Status: AC
  Filled 2021-05-14: qty 20

## 2021-05-14 MED ORDER — SODIUM CHLORIDE 0.9 % IV SOLN: INTRAVENOUS | Status: DC

## 2021-05-14 MED ORDER — SODIUM CHLORIDE 0.9 % IV SOLN
80.0000 mg | INTRAVENOUS | Status: AC
  Administered 2021-05-14: 80 mg
  Filled 2021-05-14: qty 80

## 2021-05-14 MED ORDER — HEPARIN (PORCINE) IN NACL 1000-0.9 UT/500ML-% IV SOLN
INTRAVENOUS | Status: DC | PRN
  Administered 2021-05-14: 500 mL

## 2021-05-14 MED ORDER — CHLORHEXIDINE GLUCONATE 4 % EX LIQD: 4.0000 "application " | Freq: Once | CUTANEOUS | Status: DC

## 2021-05-14 MED ORDER — ACETAMINOPHEN 325 MG PO TABS: 325.0000 mg | ORAL_TABLET | ORAL | Status: DC | PRN

## 2021-05-14 MED ORDER — FENTANYL CITRATE (PF) 100 MCG/2ML IJ SOLN
INTRAMUSCULAR | Status: DC | PRN
  Administered 2021-05-14: 25 ug via INTRAVENOUS

## 2021-05-14 MED ORDER — MIDAZOLAM HCL 2 MG/2ML IJ SOLN
INTRAMUSCULAR | Status: DC | PRN
  Administered 2021-05-14: 1 mg via INTRAVENOUS

## 2021-05-14 MED ORDER — CEFAZOLIN SODIUM-DEXTROSE 2-4 GM/100ML-% IV SOLN
2.0000 g | INTRAVENOUS | Status: AC
  Administered 2021-05-14: 2 g via INTRAVENOUS

## 2021-05-14 SURGICAL SUPPLY — 18 items
CABLE SURG 12 DISP A/V CHANNEL (MISCELLANEOUS) ×2 IMPLANT
COVER SURGICAL LIGHT HANDLE (MISCELLANEOUS) ×2 IMPLANT
DEVICE DSSCT PLSMBLD 3.0S LGHT (MISCELLANEOUS) ×1 IMPLANT
ELECT REM PT RETURN 9FT ADLT (ELECTROSURGICAL) ×2
ELECTRODE REM PT RTRN 9FT ADLT (ELECTROSURGICAL) ×1 IMPLANT
ICD ACTICOR DX (ICD Generator) ×2 IMPLANT
LEAD PLEXA 65/15 (Lead) ×2 IMPLANT
PAD ELECT DEFIB RADIOL ZOLL (MISCELLANEOUS) ×2 IMPLANT
PLASMABLADE 3.0S W/LIGHT (MISCELLANEOUS) ×2
SHEATH 8FR PRELUDE SNAP 13 (SHEATH) ×2 IMPLANT
SLING ARM IMMOBILIZER XL (CAST SUPPLIES) ×2 IMPLANT
SUT ETHIBOND CT1 BRD #0 30IN (SUTURE) ×4 IMPLANT
SUT VIC AB 2-0 CT1 27 (SUTURE) ×1
SUT VIC AB 2-0 CT1 TAPERPNT 27 (SUTURE) ×1 IMPLANT
SUT VIC AB 3-0 CT1 27 (SUTURE) ×1
SUT VIC AB 3-0 CT1 TAPERPNT 27 (SUTURE) ×1 IMPLANT
SYR CONTROL 10ML ANGIOGRAPHIC (SYRINGE) ×2 IMPLANT
TRAY PACEMAKER INSERTION (PACKS) ×2 IMPLANT

## 2021-05-14 NOTE — Discharge Summary (Signed)
Discharge Summary    Patient ID: Garrett Hunter MRN: [id number]; DOB: [birth year]  Admit date: 05/14/2021 Discharge date: 05/14/2021  PCP:  Derinda Late, MD   [facility] Providers Cardiologist:  None  Electrophysiologist:  [name], MD  {   Discharge Diagnoses    Principal Problem:   Sustained VT (ventricular tachycardia) [facility]) Active Problems:   Coronary artery disease   ICD (implantable cardioverter-defibrillator) in place   Ischemic cardiomyopathy  Diagnostic Studies/Procedures    ICD placement 05/14/21  Technical Details SURGEON:  Lars Mage, MD      PREPROCEDURE DIAGNOSES:   1. Ischemic cardiomyopathy.  2. Ventricular tachycardia     POSTPROCEDURE DIAGNOSES:   1. Ischemic cardiomyopathy.  2. Ventricular tachycardia      PROCEDURES:    1. ICD implantation.      INTRODUCTION: [name] is a 75 y.o. male with an ischemic CM and history of sustained hemodynamically significant VT. At this time, he meets criteria for ICD implantation for secondary prevention of sudden death.  The patient therefore  presents today for ICD implantation.      DESCRIPTION OF PROCEDURE:  Informed written consent was obtained and the patient was brought to the electrophysiology lab in the fasting state. The patient was adequately sedated with intravenous Versed, and fentanyl as outlined in the nursing report.  The patient's left chest was prepped and draped in the usual sterile fashion by the EP lab staff.  The skin overlying the left deltopectoral region was infiltrated with lidocaine for local analgesia.  An incision was created over the left deltopectoral region.  A left subcutaneous defibrillator pocket was fashioned using a combination of sharp and blunt dissection.  Electrocautery was used to assure hemostasis.   Lead Placement: The left axillary vein was cannulated using ultrasound guidance.  Through the left axillary vein, a RV single coil ICD lead was  advanced to the RV mid septum (model PlexaProMRI DX 65/15, serial [id number]) where it displayed excelling pacing (0.5 V at 0.79m) and sensing thresholds (22 mV) with an acceptable impedance (736 ohms). The atrial bipole had excellent sensing at 2.762m The lead was secured to the pectoral fascia. The pocket was irrigated with copious vancomycin solution.  The leads were then  connected to a pulse generator (model Acticor 7 VR-T DX, serial [id number] The pocket was closed in layers of absorbable suture. EBL < 1030mSteri-strips and a sterile dressing were applied.  During this procedure the patient is administered a total of Versed 1 mg and Fentanyl 25 mcg to achieve and maintain moderate conscious sedation.  The patient's heart rate, blood pressure, and oxygen saturation are monitored continuously during the procedure. The period of conscious sedation is 37 minutes, of which I was present face-to-face 100% of this time.      CONCLUSIONS:   1. Ischemic cardiomyopathy  2. Sustained ventricular tachycardia  3. Successful ICD implantation with a Biotronik VDD ICD implanted for secondary prevention of sudden death.   4.  No early apparent complications.    CamLysbeth Galas LamQuentin OreD, FACCoulee Medical CenterHRVirginia Beach Psychiatric Centerrdiac Electrophysiology    Estimated blood loss <50 mL.   During this procedure medications were administered to achieve and maintain moderate conscious sedation while the patient's heart rate, blood pressure, and oxygen saturation were continuously monitored and I was present face-to-face 100% of this time. _____________   History of Present Illness     JosGAVAN Hunter a 74 33o. male with pmh of CAD, AAA s/p endovascular  repair in 2018, HTN, HLD, ICM EF 45%. The patient was admitted in April 2022 and EP was consulted for VT. VT occurred while the patient was in his usual state of health and suddenly had onset of palpitations and chest discomfort. HE called 911 and was found ot be in wide complex  tachycardia and treated with amiodarone. He was cardioverted to SR in the ED and started on heparin and amiodarone. Echo showed LVEF 45-50%. LHC showed CTA RCA and Lcx.   HE had a subsequent hospitalization for GIB due to ulcer and aspirin was stopped. Plavix was continued.   Patient was seen 04/23/21 and denied recurrence since being on amiodarone. Patient was scheduled for ICD implantation.   Hospital Course     Consultants: None  The patient arrived to [facility] on 05/14/21 for scheduled procedure of ICD implantation. Procedure was successful with no early apparent complications. Patient was instructed to start Plavix Monday 05/19/21.   He was evaluated by Dr. Quentin Ore post-procedure and felt to be stable for discharge.   Did the patient have an acute coronary syndrome (MI, NSTEMI, STEMI, etc) this admission?:  No                               Did the patient have a percutaneous coronary intervention (stent / angioplasty)?:  No  _____________  Discharge Vitals Blood pressure (!) 148/73, pulse (!) 56, temperature 98.1 F (36.7 C), resp. rate 18, height '5\' 11"'$  (1.803 m), weight 94.3 kg, SpO2 99 %.  Filed Weights   05/14/21 0705  Weight: 94.3 kg    Labs & Radiologic Studies    CBC No results for input(s): WBC, NEUTROABS, HGB, HCT, MCV, PLT in the last 72 hours. Basic Metabolic Panel No results for input(s): NA, K, CL, CO2, GLUCOSE, BUN, CREATININE, CALCIUM, MG, PHOS in the last 72 hours. Liver Function Tests No results for input(s): AST, ALT, ALKPHOS, BILITOT, PROT, ALBUMIN in the last 72 hours. No results for input(s): LIPASE, AMYLASE in the last 72 hours. High Sensitivity Troponin:   No results for input(s): TROPONINIHS in the last 720 hours.  BNP Invalid input(s): POCBNP D-Dimer No results for input(s): DDIMER in the last 72 hours. Hemoglobin A1C No results for input(s): HGBA1C in the last 72 hours. Fasting Lipid Panel No results for input(s): CHOL, HDL, LDLCALC, TRIG, CHOLHDL,  LDLDIRECT in the last 72 hours. Thyroid Function Tests No results for input(s): TSH, T4TOTAL, T3FREE, THYROIDAB in the last 72 hours.  Invalid input(s): FREET3 _____________  No results found. Disposition   Pt is being discharged home today in good condition.  Follow-up Plans & Appointments     Follow-up Information     [facility] on 05/28/2021.   Specialty: Cardiology Why: wound check Contact information: [address] [place]                 Discharge Medications   Allergies as of 05/14/2021       Reactions   Crestor [rosuvastatin]    Cramps. (Hight dose of Crestor)   Livalo [pitavastatin]    myalgia   Pravachol [pravastatin Sodium]    Cramps   Zocor [simvastatin]    Cramps        Medication List     TAKE these medications    amiodarone 200 MG tablet Commonly known as: PACERONE Take 1 tablet (200 mg total) by  mouth daily.   carvedilol 12.5 MG tablet Commonly known as: COREG TAKE 1 TABLET TWICE A DAY   clopidogrel 75 MG tablet Commonly known as: PLAVIX Take 1 tablet (75 mg total) by mouth daily. Start taking on: May 19, 2021 What changed: These instructions start on May 19, 2021. If you are unsure what to do until then, ask your doctor or other care provider.   CoQ10 100 MG Caps Take 1 capsule by mouth every evening.   lisinopril-hydrochlorothiazide 20-12.5 MG tablet Commonly known as: ZESTORETIC Take 2 tablets by mouth daily.   multivitamin with minerals Tabs tablet Take 1 tablet by mouth daily.   pantoprazole 40 MG tablet Commonly known as: Protonix Take 1 tablet (40 mg total) by mouth 2 (two) times daily.   rosuvastatin 5 MG tablet Commonly known as: CRESTOR TAKE 1 TABLET DAILY   vitamin C 500 MG tablet Commonly known as: ASCORBIC ACID Take 500 mg by mouth daily.           Outstanding Labs/Studies   None  Duration of Discharge  Encounter   Greater than 30 minutes including physician time.  Signed, Beau Ramsburg Ninfa Meeker, PA-C 05/14/2021, 1:47 PM

## 2021-05-14 NOTE — Interval H&P Note (Signed)
History and Physical Interval Note:  05/14/2021 6:54 AM  Garrett Hunter  has presented today for surgery, with the diagnosis of ICD Pacemaker Implant   VT  Ischemic cardiomyopathy  CAD BioTronik Rep.  The various methods of treatment have been discussed with the patient and family. After consideration of risks, benefits and other options for treatment, the patient has consented to  Procedure(s): ICD IMPLANT (N/A) as a surgical intervention.  The patient's history has been reviewed, patient examined, no change in status, stable for surgery.  I have reviewed the patient's chart and labs.  Questions were answered to the patient's satisfaction.     Arshia Spellman T Haruna Rohlfs

## 2021-05-15 ENCOUNTER — Encounter: Payer: Self-pay | Admitting: Cardiology

## 2021-05-26 ENCOUNTER — Other Ambulatory Visit: Payer: Self-pay

## 2021-05-26 ENCOUNTER — Encounter: Payer: Self-pay | Admitting: Neurology

## 2021-05-26 ENCOUNTER — Ambulatory Visit (INDEPENDENT_AMBULATORY_CARE_PROVIDER_SITE_OTHER): Payer: Medicare Other | Admitting: Neurology

## 2021-05-26 VITALS — BP 120/69 | HR 58 | Ht 71.0 in | Wt 203.0 lb

## 2021-05-26 DIAGNOSIS — R4701 Aphasia: Secondary | ICD-10-CM | POA: Diagnosis not present

## 2021-05-26 DIAGNOSIS — I6330 Cerebral infarction due to thrombosis of unspecified cerebral artery: Secondary | ICD-10-CM

## 2021-05-26 DIAGNOSIS — I63232 Cerebral infarction due to unspecified occlusion or stenosis of left carotid arteries: Secondary | ICD-10-CM

## 2021-05-26 NOTE — Progress Notes (Signed)
[facility] [address] [place]. [place] [address] [phone]       OFFICE FOLLOW-UP NOTE  [name] Date of Birth:  Feb 16, 1946 Medical Record Number:  [id number]   HPI: Garrett Hunter is a pleasant 75 year old Caucasian male seen today for initial office follow-up visit following hospital admission for stroke in April 2022.  He is accompanied by his wife today.  History is obtained from them and review of electronic medical records and I personally reviewed available pertinent imaging films in PACS. Garrett Hunter is a 75 y.o. male with history of HTN, HLD, prior MI, CAD, AAA, CHD, ICM who was initially admitted to CVICU for sustained Vtach with cardioversion to NSR and EP evaluation who had a CODE STROKE called on 01/30/2021 after sudden status change with aphasia and R sided facial droop noted by RN. CT Head confirmed that the patient had a small L MCA infarct involving the L insula and frontal operculum. Patient received tPA. CTA Head noted an acute occlusion of the L MCA at M3 and patient had unsuccesful thrombectomy.  Attempt.  MRI scan showed moderate size left frontal MCA branch infarct.  2D echo showed diminished ejection fraction of 45 to 50% with left ventricular global hypokinesis and mildly dilated left atrium.  Carotid ultrasound showed 1-39% right ICA and 40-59% left ICA stenosis.  LDL cholesterol 64 mg percent.  Hemoglobin A1c was 6.0.  Patient was started on dual antiplatelet therapy of aspirin and Plavix for 3 months.  He was seen by vascular surgery and underwent elective left carotid revascularization on 02/07/2021 by Dr. Trula Slade with a TCAR procedure.  He also had electively ICD implanted by Dr. Quentin Ore on 05/14/2021 for his wide-complex tachycardia.[name]  He was admitted for melena on 03/16/2021 and had colonoscopy but no active bleeding source of identified.  An upper endoscopy as well and 1 small ulcer was noted which was unlikely to be the  bleeding source.  Aspirin was discontinued and he was discharged on proton pump inhibitors for 2 months.  Patient states that his speech is improving but he still has some word finding difficulties and has to search for words and talks slowly.  He has some good days and bad days.  When he gets excited or tries to talk fast he notices more difficulty.  He feels his taste has also not improved.  He has had no recurrent stroke or TIA symptoms.  He is currently getting home speech therapy which is about 2 and this week.  He has no new complaints.  Is tolerating Plavix well but does have some minor skin bruising.  Blood pressures well controlled today it is 120/69.  He remains on Crestor which is tolerating well without muscle aches and pains.  He did have follow-up carotid ultrasound in 03/10/2021 in vascular surgeon office which showed no significant restenosis and patent left ICA stent. ROS:   14 system review of systems is positive for petechiae, bruising, speech and word finding difficulties and all other systems negative  PMH:  Past Medical History:  Diagnosis Date   AAA (abdominal aortic aneurysm) (HCC)    thoracoabdominal aortic aneursym, s/p right ilio-femoral bypass 10/12/16; 3V FEVAR with SMA, RRA, and LRA stenting 11/16/16 by Dr. Sammuel Hines [facility])   Benign neoplasm of colon    patient not sure   CHF (congestive heart failure) ([place])    EF 45-50%   Chronic airway obstruction, not elsewhere classified    pt denies  Coronary artery disease    Inferior MI in 1996 treated with TPA. Cardiac cath in 2011 at [facility] showed chronic occlusion of the proximal RCA and proximal left circumflex without obstructive disease in the LAD. Non-ST elevation myocardial infarction in October 2013 while on vacation in [place]. Cardiac catheterization showed plaque rupture in the proximal ramus with thrombus. He had balloon angioplasty done. 01/28/21 cath    Coronary atherosclerosis of native coronary artery    Hepatitis A     teenager, no longer a problem   Hyperlipidemia    Intolerance to statins.   Hypertension    MI (myocardial infarction) ([place])    x 3 - last one 2013   Renal artery stenosis (HCC)    s/p bilateral renal artery angioplasty 07/26/18   Stroke ([place]) 01/2021   Ventricular tachycardia ([place])    s/p cardioverson 01/27/21    Social History:  Social History   Socioeconomic History   Marital status: Married    Spouse name: Not on file   Number of children: 4   Years of education: Not on file   Highest education level: Not on file  Occupational History   Not on file  Tobacco Use   Smoking status: Former    Packs/day: 2.00    Years: 40.00    Pack years: 80.00    Types: Cigarettes    Quit date: 2004    Years since quitting: 18.6   Smokeless tobacco: Former    Types: Nurse, children's Use: Never used  Substance and Sexual Activity   Alcohol use: Not Currently    Alcohol/week: 3.0 standard drinks    Types: 3 Cans of beer per week    Comment: occassionally   Drug use: No   Sexual activity: Yes  Other Topics Concern   Not on file  Social History Narrative   Lives at home with wife, works   Investment banker, operational of Radio broadcast assistant Strain: Not on file  Food Insecurity: Not on file  Transportation Needs: Not on file  Physical Activity: Not on file  Stress: Not on file  Social Connections: Not on file  Intimate Partner Violence: Not on file    Medications:   Current Outpatient Medications on File Prior to Visit  Medication Sig Dispense Refill   amiodarone (PACERONE) 200 MG tablet Take 1 tablet (200 mg total) by mouth daily. 90 tablet 0   carvedilol (COREG) 12.5 MG tablet TAKE 1 TABLET TWICE A DAY 180 tablet 1   clopidogrel (PLAVIX) 75 MG tablet Take 1 tablet (75 mg total) by mouth daily.     Coenzyme Q10 (COQ10) 100 MG CAPS Take 1 capsule by mouth every evening.     lisinopril-hydrochlorothiazide (ZESTORETIC) 20-12.5 MG tablet Take 2 tablets by mouth daily.      Multiple Vitamin (MULTIVITAMIN WITH MINERALS) TABS tablet Take 1 tablet by mouth daily.     rosuvastatin (CRESTOR) 5 MG tablet TAKE 1 TABLET DAILY 90 tablet 1   vitamin C (ASCORBIC ACID) 500 MG tablet Take 500 mg by mouth daily.     pantoprazole (PROTONIX) 40 MG tablet Take 1 tablet (40 mg total) by mouth 2 (two) times daily. 120 tablet 0   No current facility-administered medications on file prior to visit.    Allergies:   Allergies  Allergen Reactions   Crestor [Rosuvastatin]     Cramps. (Hight dose of Crestor)   Livalo [Pitavastatin]     myalgia   Pravachol [employer]  Sodium]     Cramps   Zocor [Simvastatin]     Cramps    Physical Exam General: well developed, well nourished, pleasant elderly Caucasian male seated, in no evident distress Head: head normocephalic and atraumatic.  Neck: supple with no carotid or supraclavicular bruits Cardiovascular: regular rate and rhythm, no murmurs Musculoskeletal: no deformity Skin:  no rash scattered petichiae on the skin of forearms and hands bilaterally Vascular:  Normal pulses all extremities Vitals:   05/26/21 0842  BP: 120/69  Pulse: (!) 58   Neurologic Exam Mental Status: Awake and fully alert. Oriented to place and time. Recent and remote memory intact. Attention span, concentration and fund of knowledge appropriate. Mood and affect appropriate.  Mild expressive aphasia with nonfluent speech with some word finding difficulties and occasional paraphasic errors.  Good comprehension and naming.  Poor repetition. Cranial Nerves: Fundoscopic exam reveals sharp disc margins. Pupils equal, briskly reactive to light. Extraocular movements full without nystagmus. Visual fields full to confrontation. Hearing intact. Facial sensation intact. Face, tongue, palate moves normally and symmetrically.  Motor: Normal bulk and tone. Normal strength in all tested extremity muscles. Sensory.: intact to touch ,pinprick .position and vibratory  sensation.  Coordination: Rapid alternating movements normal in all extremities. Finger-to-nose and heel-to-shin performed accurately bilaterally. Gait and Station: Arises from chair without difficulty. Stance is normal. Gait demonstrates normal stride length and balance . Able to heel, toe and tandem walk without difficulty.  Reflexes: 1+ and symmetric. Toes downgoing.   NIHSS 1 Modified Rankin 2   ASSESSMENT: 75 year old Caucasian male with left frontal MCA branch infarct symptomatic from proximal left carotid stenosis in April 2022 treated with IV tPA but with failed attempt at thrombectomy.  S/p elective left carotid revascularization via TCAR procedure on 02/07/2021 by Dr. Trula Slade.  Vascular risk factors of hyperlipidemia, hypertension, coronary artery disease, cardiomyopathy and congestive heart failure.     PLAN: I had a long d/w patient about his recent stroke, symptomatic left carotid stenosis and TCAR procedure, risk for recurrent stroke/TIAs, personally independently reviewed imaging studies and stroke evaluation results and answered questions.Continue Plavixl 75 mg daily  for secondary stroke prevention and maintain strict control of hypertension with blood pressure goal below 130/90, diabetes with hemoglobin A1c goal below 6.5% and lipids with LDL cholesterol goal below 70 mg/dL. I also advised the patient to eat a healthy diet with plenty of whole grains, cereals, fruits and vegetables, exercise regularly and maintain ideal body weight .he was advised to follow-up with vascular surgeon Dr. Trula Slade for left carotid surveillance.  Continue ongoing speech therapy.  Followup in the future with me in 6 months or call earlier if necessary. Greater than 50% of time during this 30 minute visit was spent on counseling,explanation of diagnosis, of carotid stenosis and stroke and aphasia planning of further management, discussion with patient and family and coordination of care Antony Contras,  MD Note: This document was prepared with digital dictation and possible smart phrase technology. Any transcriptional errors that result from this process are unintentional

## 2021-05-26 NOTE — Patient Instructions (Signed)
I had a long d/w patient about his recent stroke, symptomatic left carotid stenosis and TCAR procedure, risk for recurrent stroke/TIAs, personally independently reviewed imaging studies and stroke evaluation results and answered questions.Continue Plavixl 75 mg daily  for secondary stroke prevention and maintain strict control of hypertension with blood pressure goal below 130/90, diabetes with hemoglobin A1c goal below 6.5% and lipids with LDL cholesterol goal below 70 mg/dL. I also advised the patient to eat a healthy diet with plenty of whole grains, cereals, fruits and vegetables, exercise regularly and maintain ideal body weight .he was advised to follow-up with vascular surgeon Dr. Trula Slade for left carotid surveillance.  Continue ongoing speech therapy.  Followup in the future with me in 6 months or call earlier if necessary. Stroke Prevention Some medical conditions and behaviors are associated with a higher chance of having a stroke. You can help prevent a stroke by making nutrition, lifestyle,and other changes, including managing any medical conditions you may have. What nutrition changes can be made?  Eat healthy foods. You can do this by: Choosing foods high in fiber, such as fresh fruits and vegetables and whole grains. Eating at least 5 or more servings of fruits and vegetables a day. Try to fill half of your plate at each meal with fruits and vegetables. Choosing lean protein foods, such as lean cuts of meat, poultry without skin, fish, tofu, beans, and nuts. Eating low-fat dairy products. Avoiding foods that are high in salt (sodium). This can help lower blood pressure. Avoiding foods that have saturated fat, trans fat, and cholesterol. This can help prevent high cholesterol. Avoiding processed and premade foods. Follow your health care provider's specific guidelines for losing weight, controlling high blood pressure (hypertension), lowering high cholesterol, and managing diabetes. These  may include: Reducing your daily calorie intake. Limiting your daily sodium intake to 1,500 milligrams (mg). Using only healthy fats for cooking, such as olive oil, canola oil, or sunflower oil. Counting your daily carbohydrate intake. What lifestyle changes can be made? Maintain a healthy weight. Talk to your health care provider about your ideal weight. Get at least 30 minutes of moderate physical activity at least 5 days a week. Moderate activity includes brisk walking, biking, and swimming. Do not use any products that contain nicotine or tobacco, such as cigarettes and e-cigarettes. If you need help quitting, ask your health care provider. It may also be helpful to avoid exposure to secondhand smoke. Limit alcohol intake to no more than 1 drink a day for nonpregnant women and 2 drinks a day for men. One drink equals 12 oz of beer, 5 oz of wine, or 1 oz of hard liquor. Stop any illegal drug use. Avoid taking birth control pills. Talk to your health care provider about the risks of taking birth control pills if: You are over 9 years old. You smoke. You get migraines. You have ever had a blood clot. What other changes can be made? Manage your cholesterol levels. Eating a healthy diet is important for preventing high cholesterol. If cholesterol cannot be managed through diet alone, you may also need to take medicines. Take any prescribed medicines to control your cholesterol as told by your health care provider. Manage your diabetes. Eating a healthy diet and exercising regularly are important parts of managing your blood sugar. If your blood sugar cannot be managed through diet and exercise, you may need to take medicines. Take any prescribed medicines to control your diabetes as told by your health care provider. Control  your hypertension. To reduce your risk of stroke, try to keep your blood pressure below 130/80. Eating a healthy diet and exercising regularly are an important part of  controlling your blood pressure. If your blood pressure cannot be managed through diet and exercise, you may need to take medicines. Take any prescribed medicines to control hypertension as told by your health care provider. Ask your health care provider if you should monitor your blood pressure at home. Have your blood pressure checked every year, even if your blood pressure is normal. Blood pressure increases with age and some medical conditions. Get evaluated for sleep disorders (sleep apnea). Talk to your health care provider about getting a sleep evaluation if you snore a lot or have excessive sleepiness. Take over-the-counter and prescription medicines only as told by your health care provider. Aspirin or blood thinners (antiplatelets or anticoagulants) may be recommended to reduce your risk of forming blood clots that can lead to stroke. Make sure that any other medical conditions you have, such as atrial fibrillation or atherosclerosis, are managed. What are the warning signs of a stroke? The warning signs of a stroke can be easily remembered as BEFAST. B is for balance. Signs include: Dizziness. Loss of balance or coordination. Sudden trouble walking. E is for eyes. Signs include: A sudden change in vision. Trouble seeing. F is for face. Signs include: Sudden weakness or numbness of the face. The face or eyelid drooping to one side. A is for arms. Signs include: Sudden weakness or numbness of the arm, usually on one side of the body. S is for speech. Signs include: Trouble speaking (aphasia). Trouble understanding. T is for time. These symptoms may represent a serious problem that is an emergency. Do not wait to see if the symptoms will go away. Get medical help right away. Call your local emergency services (911 in the [place] Do not drive yourself to the hospital. Other signs of stroke may include: A sudden, severe headache with no known cause. Nausea or  vomiting. Seizure. Where to find more information For more information, visit: American Stroke Association: www.strokeassociation.org National Stroke Association: www.stroke.org Summary You can prevent a stroke by eating healthy, exercising, not smoking, limiting alcohol intake, and managing any medical conditions you may have. Do not use any products that contain nicotine or tobacco, such as cigarettes and e-cigarettes. If you need help quitting, ask your health care provider. It may also be helpful to avoid exposure to secondhand smoke. Remember BEFAST for warning signs of stroke. Get help right away if you or a loved one has any of these signs. This information is not intended to replace advice given to you by your health care provider. Make sure you discuss any questions you have with your healthcare provider. Document Revised: 09/03/2017 Document Reviewed: 10/27/2016 Elsevier Patient Education  2021 [employer].

## 2021-05-28 ENCOUNTER — Ambulatory Visit (INDEPENDENT_AMBULATORY_CARE_PROVIDER_SITE_OTHER): Payer: Medicare Other

## 2021-05-28 ENCOUNTER — Other Ambulatory Visit: Payer: Self-pay

## 2021-05-28 DIAGNOSIS — I255 Ischemic cardiomyopathy: Secondary | ICD-10-CM | POA: Diagnosis not present

## 2021-05-28 DIAGNOSIS — I472 Ventricular tachycardia, unspecified: Secondary | ICD-10-CM

## 2021-05-28 LAB — CUP PACEART INCLINIC DEVICE CHECK
Battery Voltage: 3.13 V
Brady Statistic RV Percent Paced: 0 %
Date Time Interrogation Session: 20220824150033
HighPow Impedance: 68 Ohm
Implantable Lead Implant Date: 20220810
Implantable Lead Location: 753860
Implantable Lead Model: 436909
Implantable Lead Serial Number: 81459128
Implantable Pulse Generator Implant Date: 20220810
Lead Channel Impedance Value: 521 Ohm
Lead Channel Pacing Threshold Amplitude: 0.7 V
Lead Channel Pacing Threshold Pulse Width: 0.4 ms
Lead Channel Sensing Intrinsic Amplitude: 23.8 mV
Lead Channel Sensing Intrinsic Amplitude: 4.5 mV
Lead Channel Setting Pacing Amplitude: 3 V
Lead Channel Setting Pacing Pulse Width: 0.4 ms
Lead Channel Setting Sensing Sensitivity: 0.8 mV
Pulse Gen Model: 429525
Pulse Gen Serial Number: 84855891

## 2021-05-28 NOTE — Progress Notes (Signed)
Wound check appointment. Steri-strips removed. Wound without redness or edema. Incision edges approximated, wound well healed. Normal device function. Thresholds, sensing, and impedances consistent with implant measurements. Device programmed at 3.0V for extra safety margin until 3 month visit. Histogram distribution appropriate for patient and level of activity. No ventricular arrhythmias noted. Patient educated about wound care, arm mobility, lifting restrictions, shock plan. Patient is enrolled in remote monitoring, transmitting nightly, next scheduled summary 08/13/21.  ROV with Dr. Quentin Ore on 08/13/21 in [place].

## 2021-05-28 NOTE — Patient Instructions (Signed)
   After Your ICD (Implantable Cardiac Defibrillator)    Monitor your defibrillator site for redness, swelling, and drainage. Call the device clinic at [phone] if you experience these symptoms or fever/chills.  Your incision was closed with Steri-strips or staples:  You may shower 7 days after your procedure and wash your incision with soap and water. Avoid lotions, ointments, or perfumes over your incision until it is well-healed.    You may use a hot tub or a pool after your wound check appointment if the incision is completely closed.  Do not lift, push or pull greater than 10 pounds with the affected arm until 6 weeks after your procedure. There are no other restrictions in arm movement after your wound check appointment.  Your ICD is MRI compatible.  Your ICD is designed to protect you from life threatening heart rhythms. Because of this, you may receive a shock.   1 shock with no symptoms:  Call the office during business hours. 1 shock with symptoms (chest pain, chest pressure, dizziness, lightheadedness, shortness of breath, overall feeling unwell):  Call 911. If you experience 2 or more shocks in 24 hours:  Call 911. If you receive a shock, you should not drive.  [place] DMV - no driving for 6 months if you receive appropriate therapy from your ICD.   ICD Alerts:  Some alerts are vibratory and others beep. These are NOT emergencies. Please call our office to let us know. If this occurs at night or on weekends, it can wait until the next business day. Send a remote transmission.  If your device is capable of reading fluid status (for heart failure), you will be offered monthly monitoring to review this with you.   Remote monitoring is used to monitor your ICD from home. This monitoring is scheduled every 91 days by our office. It allows us to keep an eye on the functioning of your device to ensure it is working properly. You will routinely see your Electrophysiologist annually  (more often if necessary).   

## 2021-05-29 ENCOUNTER — Ambulatory Visit (INDEPENDENT_AMBULATORY_CARE_PROVIDER_SITE_OTHER): Payer: Medicare Other | Admitting: Gastroenterology

## 2021-05-29 ENCOUNTER — Telehealth: Payer: Self-pay

## 2021-05-29 ENCOUNTER — Other Ambulatory Visit: Payer: Self-pay

## 2021-05-29 ENCOUNTER — Encounter: Payer: Self-pay | Admitting: Gastroenterology

## 2021-05-29 VITALS — BP 132/64 | HR 60 | Temp 98.2°F | Ht 71.0 in | Wt 202.4 lb

## 2021-05-29 DIAGNOSIS — D126 Benign neoplasm of colon, unspecified: Secondary | ICD-10-CM | POA: Diagnosis not present

## 2021-05-29 DIAGNOSIS — Z8719 Personal history of other diseases of the digestive system: Secondary | ICD-10-CM

## 2021-05-29 DIAGNOSIS — I719 Aortic aneurysm of unspecified site, without rupture: Secondary | ICD-10-CM | POA: Insufficient documentation

## 2021-05-29 DIAGNOSIS — Z8711 Personal history of peptic ulcer disease: Secondary | ICD-10-CM | POA: Diagnosis not present

## 2021-05-29 MED ORDER — PANTOPRAZOLE SODIUM 40 MG PO TBEC: 40.0000 mg | DELAYED_RELEASE_TABLET | Freq: Every day | ORAL | 3 refills | Status: DC

## 2021-05-29 NOTE — Telephone Encounter (Signed)
Put in recall

## 2021-05-29 NOTE — Progress Notes (Signed)
[name], MD [address]  [place], [place] [address]  Main: [phone]  Fax: [phone]    Gastroenterology Consultation  Referring Provider:     Derinda Late, MD Primary Care Physician:  Derinda Late, MD Primary Gastroenterologist:  Dr. [name] Reason for Consultation:     Peptic ulcer disease, hospital follow-up        HPI:   Garrett Hunter is a 75 y.o. male referred by Dr. Derinda Late, MD  for consultation & management of recent diagnosis of upper GI bleed secondary to gastric ulcer.  He has history of AAA, coronary disease on DAPT presented to [facility] on 08/02/2018 secondary to 1 day episode of melena, symptomatic anemia, hemoglobin 10 on admission with baseline 13.7 in 06/2018.  Patient underwent EGD which revealed gastric ulcer with flap admitted spot it was treated with bipolar cautery.  He did not have iron deficiency at that time.  He was discharged home on Protonix 40 mg twice daily with GI follow-up as outpatient.  Hemoglobin was 8.4 on the day of discharge on 08/03/2018.  Patient has history of coronary disease, status post stent placement in 2015, was taking DAPT prior to last admission.  He was not taking PPI as outpatient  Interval summary He denies any complaints today.  His energy levels are good.  No longer having black stools.  He took Protonix 40 twice daily for a month and ran out of prescription.  He restarted DAPT few days after being discharged from the hospital.  He does not have follow-up with cardiologist yet.  Follow-up visit 05/29/2021 Patient is here for anemia.  Patient was admitted to [facility] in 03/2021 secondary to acute left MCA ischemic stroke, s/p tPA, ICD placement on 05/14/2021, on DAPT.  Patient developed melena during this hospitalization, underwent upper endoscopy which revealed a gastric ulcer.  Colonoscopy did not reveal source of melena.  Since discharge patient has been on Protonix 40 mg once a day  anti-inflammatory medication.  He denies any further episodes of melena.  He does not have iron deficiency, his hemoglobin is borderline, he does have B12 deficiency for which he takes B12 supplements thousand MCG daily.  He reports that he is going to have labs done tomorrow.  NSAIDs: None  Antiplts/Anticoagulants/Anti thrombotics: On DAPT for history of coronary artery disease until 08/01/2018.  GI Procedures:  Reports having had a colonoscopy more than 6 years ago by Dr. Candace Cruise Report not available  EGD 08/03/2018 - Normal duodenal bulb and second portion of the duodenum. - Non-bleeding gastric ulcer with a flat pigmented spot (Forrest Class IIc). Treated with bipolar cautery. Clip (MR conditional) was placed. - Non-bleeding erosive gastropathy. Biopsied. - Small hiatal hernia. - Normal gastroesophageal junction and esophagus. DIAGNOSIS:  A.  STOMACH, RANDOM COLD BIOPSY:  - ANTRAL AND OXYNTIC MUCOSA WITH MILD REACTIVE GASTROPATHY AND EDEMA.  - NEGATIVE FOR ACTIVE INFLAMMATION, H. PYLORI, INTESTINAL METAPLASIA,  DYSPLASIA, AND MALIGNANCY.   Upper endoscopy 03/19/2021 - Normal duodenal bulb, second portion of the duodenum and third portion of the duodenum. - Non-bleeding gastric ulcer with a clean ulcer base (Forrest Class III). - Erosive gastropathy with no bleeding and no stigmata of recent bleeding. Biopsied. - Erythematous mucosa in the gastric body. Biopsied. - Normal antrum. Biopsied. - Normal gastroesophageal junction and esophagus. - Esophagogastric landmarks identified. DIAGNOSIS:  A. STOMACH; COLD BIOPSY:  - ANTRAL MUCOSA WITH FOCAL CHRONIC AND MILD REACTIVE GASTRITIS.  - NEGATIVE FOR H.  PYLORI, INTESTINAL METAPLASIA, DYSPLASIA, AND  MALIGNANCY.  Colonoscopy 03/20/2021 - Preparation of the colon was poor. - The examined portion of the ileum was normal. - Two 5 mm polyps in the cecum and at the appendiceal orifice, removed with a cold snare. Resected and retrieved. -  Diverticulosis in the entire examined colon. - Non-bleeding external hemorrhoids.  DIAGNOSIS:  A. COLON POLYP, CECUM AND APPENDICEAL ORIFICE; COLD SNARE:  - TUBULAR ADENOMA (MULTIPLE FRAGMENTS); NEGATIVE FOR HIGH-GRADE  DYSPLASIA AND MALIGNANCY.  - SERRATED POLYP, FAVOR INFLAMED SESSILE SERRATED POLYP; NEGATIVE FOR  DYSPLASIA AND MALIGNANCY.  - DEEPER SECTIONS EXAMINED.  Past Medical History:  Diagnosis Date   AAA (abdominal aortic aneurysm) (HCC)    thoracoabdominal aortic aneursym, s/p right ilio-femoral bypass 10/12/16; 3V FEVAR with SMA, RRA, and LRA stenting 11/16/16 by Dr. Sammuel Hines [facility])   Benign neoplasm of colon    patient not sure   CHF (congestive heart failure) (HCC)    EF 45-50%   Chronic airway obstruction, not elsewhere classified    pt denies   Coronary artery disease    Inferior MI in 1996 treated with TPA. Cardiac cath in 2011 at [facility] showed chronic occlusion of the proximal RCA and proximal left circumflex without obstructive disease in the LAD. Non-ST elevation myocardial infarction in October 2013 while on vacation in [place]. Cardiac catheterization showed plaque rupture in the proximal ramus with thrombus. He had balloon angioplasty done. 01/28/21 cath    Coronary atherosclerosis of native coronary artery    Hepatitis A    teenager, no longer a problem   Hyperlipidemia    Intolerance to statins.   Hypertension    MI (myocardial infarction) ([place])    x 3 - last one 2013   Renal artery stenosis (HCC)    s/p bilateral renal artery angioplasty 07/26/18   Stroke ([place]) 01/2021   Ventricular tachycardia ([place])    s/p cardioverson 01/27/21    Past Surgical History:  Procedure Laterality Date   ABDOMINAL AORTIC ANEURYSM REPAIR     11/16/2016   ANGIOPLASTY     CARDIAC CATHETERIZATION  2011   CARDIAC CATHETERIZATION  2/14   [facility]; 95% lesion in the ramus intermedius.    CATARACT EXTRACTION W/PHACO Left 12/19/2018   Procedure: CATARACT EXTRACTION PHACO AND INTRAOCULAR LENS  PLACEMENT ([place])  LEFT;  Surgeon: Eulogio Bear, MD;  Location: [place];  Service: Ophthalmology;  Laterality: Left;   CATARACT EXTRACTION W/PHACO Right 03/06/2019   Procedure: CATARACT EXTRACTION PHACO AND INTRAOCULAR LENS PLACEMENT ([facility])RIGHT;  Surgeon: Eulogio Bear, MD;  Location: [place];  Service: Ophthalmology;  Laterality: Right;   COLONOSCOPY WITH PROPOFOL N/A 03/20/2021   Procedure: COLONOSCOPY WITH PROPOFOL;  Surgeon: Lin Landsman, MD;  Location: [facility] ENDOSCOPY;  Service: Gastroenterology;  Laterality: N/A;   CORONARY ANGIOPLASTY  1996   [facility] x1   CORONARY ANGIOPLASTY  2/14   Severe restenosis in the ostial ramus. Status post angioplasty and drug-eluting stent placement with a 2.5 x 18 mm Xience drug-eluting stent   ESOPHAGOGASTRODUODENOSCOPY N/A 08/03/2018   Procedure: ESOPHAGOGASTRODUODENOSCOPY (EGD);  Surgeon: Lin Landsman, MD;  Location[facility] END[facility]COPY;  Service: Gastroenterology;  Laterality: N/A;   ESOPHAGOGASTRODUODENOSCOPY (EGD) WITH PROPOFOL N/A 11/03/2018   Procedure: ESOPHAGOGASTRODUODENOSCOPY (EGD) WITH PROPOFOL;  Surgeon: Lin Landsman, MD;  Location: [facility] ENDOSCOPY;  Service: Gastroenterology;  Laterality: N/A;   ESOPHAGOGASTRODUODENOSCOPY (EGD) WITH PROPOFOL N/A 12/06/2018   Procedure: ESOPHAGOGASTRODUODENOSCOPY (EGD) WITH PROPOFOL;  Surgeon: Lucilla Lame, MD;  Location: [facility] ENDOSCOPY;  Service: Endoscopy;  Laterality: N/A;   ESOPHAGOGASTRODUODENOSCOPY (EGD) WITH PROPOFOL N/A 03/19/2021   Procedure: ESOPHAGOGASTRODUODENOSCOPY (EGD) WITH PROPOFOL;  Surgeon: Lin Landsman, MD;  Location: [facility] ENDOSCOPY;  Service: Gastroenterology;  Laterality: N/A;   ICD IMPLANT N/A 05/14/2021   Procedure: ICD IMPLANT;  Surgeon: [name], MD;  Location: [place] CV LAB;  Service: Cardiovascular;  Laterality: N/A;   IR CT HEAD LTD  01/30/2021   IR PERCUTANEOUS ART THROMBECTOMY/INFUSION INTRACRANIAL INC DIAG ANGIO  01/30/2021    LEFT HEART CATH AND CORONARY ANGIOGRAPHY N/A 01/28/2021   Procedure: LEFT HEART CATH AND CORONARY ANGIOGRAPHY;  Surgeon: Nelva Bush, MD;  Location: [place] CV LAB;  Service: Cardiovascular;  Laterality: N/A;   RADIOLOGY WITH ANESTHESIA N/A 01/30/2021   Procedure: IR WITH ANESTHESIA;  Surgeon: Luanne Bras, MD;  Location: [place];  Service: Radiology;  Laterality: N/A;   TONSILLECTOMY AND ADENOIDECTOMY     TRANSCAROTID ARTERY REVASCULARIZATION  Left 02/07/2021   Procedure: TRANSCAROTID ARTERY REVASCULARIZATION LEFT;  Surgeon: [name], MD;  Location: [facility] OR;  Service: Vascular;  Laterality: Left;    Current Outpatient Medications:    amiodarone (PACERONE) 200 MG tablet, Take 1 tablet (200 mg total) by mouth daily., Disp: 90 tablet, Rfl: 0   carvedilol (COREG) 12.5 MG tablet, TAKE 1 TABLET TWICE A DAY, Disp: 180 tablet, Rfl: 1   clopidogrel (PLAVIX) 75 MG tablet, Take 1 tablet (75 mg total) by mouth daily., Disp: , Rfl:    Coenzyme Q10 (COQ10) 100 MG CAPS, Take 1 capsule by mouth every evening., Disp: , Rfl:    lisinopril-hydrochlorothiazide (ZESTORETIC) 20-12.5 MG tablet, Take 2 tablets by mouth daily., Disp: , Rfl:    Multiple Vitamin (MULTIVITAMIN WITH MINERALS) TABS tablet, Take 1 tablet by mouth daily., Disp: , Rfl:    rosuvastatin (CRESTOR) 5 MG tablet, TAKE 1 TABLET DAILY, Disp: 90 tablet, Rfl: 1   vitamin C (ASCORBIC ACID) 500 MG tablet, Take 500 mg by mouth daily., Disp: , Rfl:    pantoprazole (PROTONIX) 40 MG tablet, Take 1 tablet (40 mg total) by mouth daily., Disp: 90 tablet, Rfl: 3   Family History  Problem Relation Age of Onset   Cancer Mother 67       stomach   Heart attack Father    Hypertension Sister    Hypertension Brother      Social History   Tobacco Use   Smoking status: Former    Packs/day: 2.00    Years: 40.00    Pack years: 80.00    Types: Cigarettes    Quit date: 2004    Years since quitting: 18.6   Smokeless tobacco: Former     Types: Nurse, children's Use: Never used  Substance Use Topics   Alcohol use: Not Currently    Alcohol/week: 3.0 standard drinks    Types: 3 Cans of beer per week    Comment: occassionally   Drug use: No    Allergies as of 05/29/2021 - Review Complete 05/29/2021  Allergen Reaction Noted   Crestor [rosuvastatin]  07/14/2012   Livalo [pitavastatin]  07/14/2012   Pravachol [pravastatin sodium]  07/14/2012   Zocor [simvastatin]  07/14/2012    Review of Systems:    All systems reviewed and negative except where noted in HPI.   Physical Exam:  BP 132/64 (BP Location: Left Arm, Patient Position: Sitting, Cuff Size: Normal)   Pulse 60   Temp 98.2 F (36.8 C) (Oral)   Ht  $'5\' 11"'r$  (1.803 m)   Wt 202 lb 6 oz (91.8 kg)   BMI 28.23 kg/m  No LMP for male patient.  General:   Alert,  Well-developed, well-nourished, pleasant and cooperative in NAD Head:  Normocephalic and atraumatic. Eyes:  Sclera clear, no icterus.   Conjunctiva pink. Ears:  Normal auditory acuity. Nose:  No deformity, discharge, or lesions. Mouth:  No deformity or lesions,oropharynx pink & moist. Neck:  Supple; no masses or thyromegaly. Lungs:  Respirations even and unlabored.  Clear throughout to auscultation.   No wheezes, crackles, or rhonchi. No acute distress. Heart:  Regular rate and rhythm; no murmurs, clicks, rubs, or gallops. Abdomen:  Normal bowel sounds. Soft, non-tender and non-distended without masses, hepatosplenomegaly or hernias noted.  No guarding or rebound tenderness.   Rectal: Not performed Msk:  Symmetrical without gross deformities. Good, equal movement & strength bilaterally. Pulses:  Normal pulses noted. Extremities:  No clubbing or edema.  No cyanosis. Neurologic:  Alert and oriented x3;  grossly normal neurologically. Skin:  Intact without significant lesions or rashes. No jaundice. Psych:  Alert and cooperative. Normal mood and affect.  Imaging Studies: Reviewed  Assessment and  Plan:   AUN VERRICO is a 75 y.o. Caucasian male with AAA, coronary disease status post PCI in 2014, history of left MCA stroke received tPA 01/30/2021, history of gastric ulcer s/p cauterization, history of wide-complex ventricular tachycardia on DAPT is here for follow-up of melena resulting in severe anemia. There is no evidence of H. pylori on biopsies.  He finished 1 month course of Protonix twice daily.  Currently asymptomatic  History of gastric ulcer s/p cautery Repeat EGD confirmed significant healing of the ulcer Restart Protonix 40 mg once daily and indefinitely  History of melena Recommend video capsule endoscopy given upper endoscopy and colonoscopy did not identify source of melena and anemia  History of tubular adenoma of the colon Recommend surveillance colonoscopy in 03/2022   Follow up as needed  [name], MD

## 2021-05-29 NOTE — Telephone Encounter (Signed)
-----   Message from Lin Landsman, MD sent at 05/29/2021  2:54 PM EDT ----- Regarding: Colonoscopy Please put in a recall for surveillance colonoscopy in 03/2022 Dx: Tubular adenoma colon, poor prep  RV

## 2021-06-04 ENCOUNTER — Ambulatory Visit: Payer: Medicare Other | Admitting: Cardiology

## 2021-06-18 ENCOUNTER — Telehealth: Payer: Self-pay

## 2021-06-18 NOTE — Telephone Encounter (Signed)
Biotronik alert received for VT (onoging). Called patient to assess, spoke to [place] and he is admitted at [facility]). States patient did not fell well last night so we to ED. Advised to call when they come back so we can plan for follow up/review with Dr. Quentin Ore. Verbalized understanding.

## 2021-06-19 ENCOUNTER — Telehealth: Payer: Self-pay | Admitting: Gastroenterology

## 2021-06-19 NOTE — Telephone Encounter (Signed)
Called and left a detail message letting him know I would cancel and to let us know if he wants to reschedule. Called endo and talk to Jocelyn Lamer and got it cancel

## 2021-06-19 NOTE — Telephone Encounter (Signed)
Pt. Would like to cancel capsule study

## 2021-06-19 NOTE — Telephone Encounter (Signed)
Biotronik alert received for HVR with ATP therapy 06/18/21 at 10:53 am. Successful call to patient's wife as patient remains hospitalized in [facility] for VT. Anticipate D/C Monday per wife. Discussed [place] driving restrictions with wife and encouraged her to call device for additional information once patient is discharged from hospital and returned home. Wife verbalized understanding. Will route to Dr. Quentin Ore for review.

## 2021-06-26 ENCOUNTER — Encounter: Admission: RE | Payer: Self-pay | Source: Home / Self Care

## 2021-06-26 ENCOUNTER — Ambulatory Visit: Admission: RE | Admit: 2021-06-26 | Payer: Medicare Other | Source: Home / Self Care | Admitting: Gastroenterology

## 2021-06-26 SURGERY — IMAGING PROCEDURE, GI TRACT, INTRALUMINAL, VIA CAPSULE

## 2021-06-30 ENCOUNTER — Other Ambulatory Visit: Payer: Self-pay

## 2021-06-30 ENCOUNTER — Other Ambulatory Visit: Payer: Self-pay | Admitting: Cardiology

## 2021-06-30 DIAGNOSIS — I4729 Other ventricular tachycardia: Secondary | ICD-10-CM

## 2021-06-30 DIAGNOSIS — I472 Ventricular tachycardia: Secondary | ICD-10-CM

## 2021-06-30 MED ORDER — AMIODARONE HCL 200 MG PO TABS: 200.0000 mg | ORAL_TABLET | Freq: Every day | ORAL | 0 refills | Status: DC

## 2021-07-08 NOTE — Progress Notes (Signed)
Electrophysiology Office Follow up Visit Note:    Date:  07/09/2021   ID:  Garrett Hunter, DOB 03/26/1946, MRN [id number]  PCP:  Derinda Late, MD  [facility] Cardiologist:  None  [facility] Electrophysiologist:  [name], MD    Interval History:    Garrett Hunter is a 75 y.o. male who presents for a follow up after an ICD implant 05/14/2021 for a history of sustained VT. He has a biotronik VDD ICD. Device interrogation after implant has shown a normally functioning device.  He did have an episode of sustained ventricular tachycardia while he was in [facility].  While in [place] he actually experienced flash pulmonary edema before he was ATP out of the VT.  Since his hospitalization he has been taking Entresto and has stopped his lisinopril-hydrochlorothiazide.  No syncope or presyncope since I last saw him.    Past Medical History:  Diagnosis Date   AAA (abdominal aortic aneurysm)    thoracoabdominal aortic aneursym, s/p right ilio-femoral bypass 10/12/16; 3V FEVAR with SMA, RRA, and LRA stenting 11/16/16 by Dr. Sammuel Hines [facility])   Benign neoplasm of colon    patient not sure   CHF (congestive heart failure) (HCC)    EF 45-50%   Chronic airway obstruction, not elsewhere classified    pt denies   Coronary artery disease    Inferior MI in 1996 treated with TPA. Cardiac cath in 2011 at [facility] showed chronic occlusion of the proximal RCA and proximal left circumflex without obstructive disease in the LAD. Non-ST elevation myocardial infarction in October 2013 while on vacation in [place]. Cardiac catheterization showed plaque rupture in the proximal ramus with thrombus. He had balloon angioplasty done. 01/28/21 cath    Coronary atherosclerosis of native coronary artery    Hepatitis A    teenager, no longer a problem   Hyperlipidemia    Intolerance to statins.   Hypertension    MI (myocardial infarction) ([place])    x 3 - last one 2013   Renal artery stenosis  (HCC)    s/p bilateral renal artery angioplasty 07/26/18   Stroke ([place]) 01/2021   Ventricular tachycardia    s/p cardioverson 01/27/21    Past Surgical History:  Procedure Laterality Date   ABDOMINAL AORTIC ANEURYSM REPAIR     11/16/2016   ANGIOPLASTY     CARDIAC CATHETERIZATION  2011   CARDIAC CATHETERIZATION  2/14   [facility]; 95% lesion in the ramus intermedius.    CATARACT EXTRACTION W/PHACO Left 12/19/2018   Procedure: CATARACT EXTRACTION PHACO AND INTRAOCULAR LENS PLACEMENT ([place])  LEFT;  Surgeon: Eulogio Bear, MD;  Location: [place];  Service: Ophthalmology;  Laterality: Left;   CATARACT EXTRACTION W/PHACO Right 03/06/2019   Procedure: CATARACT EXTRACTION PHACO AND INTRAOCULAR LENS PLACEMENT (IOC)RIGHT;  Surgeon: Eulogio Bear, MD;  Location: [place];  Service: Ophthalmology;  Laterality: Right;   COLONOSCOPY WITH PROPOFOL N/A 03/20/2021   Procedure: COLONOSCOPY WITH PROPOFOL;  Surgeon: Lin Landsman, MD;  Location[facility] ENDOSCOPY;  Service: Gastroenterology;  Laterality: N/A;   CORONARY ANGIOPLASTY  1996   [facility] x1   CORONARY ANGIOPLASTY  2/14   Severe restenosis in the ostial ramus. Status post angioplasty and drug-eluting stent placement with a 2.5 x 18 mm Xience drug-eluting stent   ESOPHAGOGASTRODUODENOSCOPY N/A 08/03/2018   Procedure: ESOPHAGOGASTRODUODENOSCOPY (EGD);  Surgeon: Lin Landsman, MD;  Location[facility]COPY;  Service: Gastroenterology;  Laterality: N/A;   ESOPHAGOGASTRODUODENOSCOPY (EGD) WITH PROPOFOL N/A  11/03/2018   Procedure: ESOPHAGOGASTRODUODENOSCOPY (EGD) WITH PROPOFOL;  Surgeon: Lin Landsman, MD;  Location[facility] ENDOSCOPY;  Service: Gastroenterology;  Laterality: N/A;   ESOPHAGOGASTRODUODENOSCOPY (EGD) WITH PROPOFOL N/A 12/06/2018   Procedure: ESOPHAGOGASTRODUODENOSCOPY (EGD) WITH PROPOFOL;  Surgeon: Lucilla Lame, MD;  Location: [facility] ENDOSCOPY;  Service: Endoscopy;  Laterality: N/A;   ESOPHAGOGASTRODUODENOSCOPY  (EGD) WITH PROPOFOL N/A 03/19/2021   Procedure: ESOPHAGOGASTRODUODENOSCOPY (EGD) WITH PROPOFOL;  Surgeon: Lin Landsman, MD;  Location: [facility] ENDOSCOPY;  Service: Gastroenterology;  Laterality: N/A;   ICD IMPLANT N/A 05/14/2021   Procedure: ICD IMPLANT;  Surgeon: [name], MD;  Location: [place] CV LAB;  Service: Cardiovascular;  Laterality: N/A;   IR CT HEAD LTD  01/30/2021   IR PERCUTANEOUS ART THROMBECTOMY/INFUSION INTRACRANIAL INC DIAG ANGIO  01/30/2021   LEFT HEART CATH AND CORONARY ANGIOGRAPHY N/A 01/28/2021   Procedure: LEFT HEART CATH AND CORONARY ANGIOGRAPHY;  Surgeon: Nelva Bush, MD;  Location: [place] CV LAB;  Service: Cardiovascular;  Laterality: N/A;   RADIOLOGY WITH ANESTHESIA N/A 01/30/2021   Procedure: IR WITH ANESTHESIA;  Surgeon: Luanne Bras, MD;  Location: [place];  Service: Radiology;  Laterality: N/A;   TONSILLECTOMY AND ADENOIDECTOMY     TRANSCAROTID ARTERY REVASCULARIZATION  Left 02/07/2021   Procedure: TRANSCAROTID ARTERY REVASCULARIZATION LEFT;  Surgeon: [name], MD;  Location: [facility];  Service: Vascular;  Laterality: Left;    Current Medications: Current Meds  Medication Sig   amiodarone (PACERONE) 200 MG tablet Take 1 tablet (200 mg total) by mouth daily.   carvedilol (COREG) 12.5 MG tablet TAKE 1 TABLET TWICE A DAY   clopidogrel (PLAVIX) 75 MG tablet Take 1 tablet (75 mg total) by mouth daily.   Coenzyme Q10 (COQ10) 100 MG CAPS Take 1 capsule by mouth every evening.   furosemide (LASIX) 40 MG tablet Take 40 mg by mouth.   Multiple Vitamin (MULTIVITAMIN WITH MINERALS) TABS tablet Take 1 tablet by mouth daily.   pantoprazole (PROTONIX) 40 MG tablet Take 1 tablet (40 mg total) by mouth daily.   rosuvastatin (CRESTOR) 5 MG tablet TAKE 1 TABLET DAILY   sacubitril-valsartan (ENTRESTO) 24-26 MG Take 1 tablet by mouth 2 (two) times daily.   vitamin C (ASCORBIC ACID) 500 MG tablet Take 500 mg by mouth daily.     Allergies:    Crestor [rosuvastatin], Livalo [pitavastatin], Pravachol [pravastatin sodium], and Zocor [simvastatin]   Social History   Socioeconomic History   Marital status: Married    Spouse name: Not on file   Number of children: 4   Years of education: Not on file   Highest education level: Not on file  Occupational History   Not on file  Tobacco Use   Smoking status: Former    Packs/day: 2.00    Years: 40.00    Pack years: 80.00    Types: Cigarettes    Quit date: 2004    Years since quitting: 18.7   Smokeless tobacco: Former    Types: Nurse, children's Use: Never used  Substance and Sexual Activity   Alcohol use: Not Currently    Alcohol/week: 3.0 standard drinks    Types: 3 Cans of beer per week    Comment: occassionally   Drug use: No   Sexual activity: Yes  Other Topics Concern   Not on file  Social History Narrative   Lives at home with wife, works   Investment banker, operational of Radio broadcast assistant Strain: Not on file  Food Insecurity: Not on file  Transportation Needs: Not on file  Physical Activity: Not on file  Stress: Not on file  Social Connections: Not on file     Family History: The patient's family history includes Cancer (age of onset: 25) in his mother; Heart attack in his father; Hypertension in his brother and sister.  ROS:   Please see the history of present illness.    All other systems reviewed and are negative.  EKGs/Labs/Other Studies Reviewed:    The following studies were reviewed today:  07/09/2021 In clinic device interrogation personally reviewed 2 episodes of VT on September 14.  Each episode had a cycle length of approximately 440 ms.  Each episode was successfully treated with ATP.  Review of EGM's confirms VT.  Lead parameters are stable Today I changed his ATP scheme's to provide 8 sequences of ATP in the VT zone.  We also changed his pacing output to auto adjust.  EKG:  The ekg ordered today demonstrates sinus rhythm with  a first-degree AV delay.  Recent Labs: 03/16/2021: ALT 16 03/17/2021: Magnesium 2.0 03/20/2021: BUN 9; Creatinine, Ser 1.19; Hemoglobin 9.3; Platelets 223; Potassium 4.1; Sodium 140  Recent Lipid Panel    Component Value Date/Time   CHOL 119 01/30/2021 0530   CHOL 121 09/13/2012 0819   TRIG 85 01/30/2021 0530   HDL 38 (L) 01/30/2021 0530   HDL 37 (L) 09/13/2012 0819   CHOLHDL 3.1 01/30/2021 0530   VLDL 17 01/30/2021 0530   LDLCALC 64 01/30/2021 0530   LDLCALC 68 09/13/2012 0819    Physical Exam:    VS:  BP 120/70   Pulse 62   Ht 5\' 11"  (1.803 m)   Wt 198 lb (89.8 kg)   SpO2 98%   BMI 27.62 kg/m     Wt Readings from Last 3 Encounters:  07/09/21 198 lb (89.8 kg)  05/29/21 202 lb 6 oz (91.8 kg)  05/26/21 203 lb (92.1 kg)     GEN:  Well nourished, well developed in no acute distress HEENT: Normal NECK: No JVD; No carotid bruits LYMPHATICS: No lymphadenopathy CARDIAC: RRR, no murmurs, rubs, gallops.  ICD pocket well-healed RESPIRATORY:  Clear to auscultation without rales, wheezing or rhonchi  ABDOMEN: Soft, non-tender, non-distended MUSCULOSKELETAL:  No edema; No deformity  SKIN: Warm and dry NEUROLOGIC:  Alert and oriented x 3 PSYCHIATRIC:  Normal affect   ASSESSMENT:    1. Sustained VT (ventricular tachycardia)   2. Chronic systolic congestive heart failure ([place])   3. Primary hypertension   4. ICD (implantable cardioverter-defibrillator) in place   5. Encounter for long-term (current) use of high-risk medication    PLAN:    In order of problems listed above:  #Sustained VT Today's interrogation does show 2 episodes on September 14 that were successfully treated with ATP.  He takes amiodarone 200 mg by mouth once daily.  I will check a CMP, TSH and free T4 today.  I also increased his ATP therapy in his VT zone to 8 ATP scheme's with a 10 ms decrement on each scheme.  If he continues to have VT, would favor starting mexiletine.  We also briefly discussed  catheter ablation during today's visit.  I do think he would be a candidate for a catheter ablation if he has recurrent VT despite medical therapy.  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  NYHA class II.  Warm and dry on exam.  On Entresto, Coreg.  We  will plan for a 52-month follow-up with a APP in 1 year with me.   Total time spent with patient today 41 minutes. This includes reviewing records, evaluating the patient and coordinating care.   Medication Adjustments/Labs and Tests Ordered: Current medicines are reviewed at length with the patient today.  Concerns regarding medicines are outlined above.  No orders of the defined types were placed in this encounter.  No orders of the defined types were placed in this encounter.    Signed, Lars Mage, MD, [facility], [facility] 07/09/2021 9:24 AM    Electrophysiology [facility]

## 2021-07-09 ENCOUNTER — Ambulatory Visit (INDEPENDENT_AMBULATORY_CARE_PROVIDER_SITE_OTHER): Payer: Medicare Other | Admitting: Cardiology

## 2021-07-09 ENCOUNTER — Other Ambulatory Visit: Payer: Self-pay

## 2021-07-09 ENCOUNTER — Encounter: Payer: Self-pay | Admitting: Cardiology

## 2021-07-09 VITALS — BP 120/70 | HR 62 | Ht 71.0 in | Wt 198.0 lb

## 2021-07-09 DIAGNOSIS — Z95810 Presence of automatic (implantable) cardiac defibrillator: Secondary | ICD-10-CM

## 2021-07-09 DIAGNOSIS — I10 Essential (primary) hypertension: Secondary | ICD-10-CM | POA: Diagnosis not present

## 2021-07-09 DIAGNOSIS — I472 Ventricular tachycardia, unspecified: Secondary | ICD-10-CM

## 2021-07-09 DIAGNOSIS — Z79899 Other long term (current) drug therapy: Secondary | ICD-10-CM

## 2021-07-09 DIAGNOSIS — I5022 Chronic systolic (congestive) heart failure: Secondary | ICD-10-CM | POA: Diagnosis not present

## 2021-07-09 LAB — PACEMAKER DEVICE OBSERVATION

## 2021-07-09 NOTE — Patient Instructions (Addendum)
Medication Instructions:  Your physician recommends that you continue on your current medications as directed. Please refer to the Current Medication list given to you today. *If you need a refill on your cardiac medications before your next appointment, please call your pharmacy*  Lab Work: You will get lab work today:  CMP, TSH and free T4  If you have labs (blood work) drawn today and your tests are completely normal, you will receive your results only by: [place] (if you have [place]) OR A paper copy in the mail If you have any lab test that is abnormal or we need to change your treatment, we will call you to review the results.  Testing/Procedures: None ordered.  Follow-Up: At [facility], you and your health needs are our priority.  As part of our continuing mission to provide you with exceptional heart care, we have created designated Provider Care Teams.  These Care Teams include your primary Cardiologist (physician) and Advanced Practice Providers (APPs -  Physician Assistants and Nurse Practitioners) who all work together to provide you with the care you need, when you need it.  Your next appointment:   6 months with one of the following Advanced Practice Providers on your designated Care Team:   Murray Hodgkins, NP Christell Faith, PA-C Marrianne Mood, PA-C Cadence Kathlen Mody, [place]  Your physician wants you to follow-up in: one year with Dr. Hunt Oris will receive a reminder letter in the mail two months in advance. If you don't receive a letter, please call our office to schedule the follow-up appointment.  Remote monitoring is used to monitor your ICD from home. This monitoring reduces the number of office visits required to check your device to one time per year. It allows [place] to keep an eye on the functioning of your device to ensure it is working properly. You are scheduled for a device check from home on 08/13/2021. You may send your transmission at any time that day.  If you have a wireless device, the transmission will be sent automatically. After your physician reviews your transmission, you will receive a postcard with your next transmission date.

## 2021-07-10 LAB — COMPREHENSIVE METABOLIC PANEL
ALT: 15 IU/L (ref 0–44)
AST: 17 IU/L (ref 0–40)
Albumin/Globulin Ratio: 1.7 (ref 1.2–2.2)
Albumin: 3.8 g/dL (ref 3.7–4.7)
Alkaline Phosphatase: 117 IU/L (ref 44–121)
BUN/Creatinine Ratio: 10 (ref 10–24)
BUN: 11 mg/dL (ref 8–27)
Bilirubin Total: 0.3 mg/dL (ref 0.0–1.2)
CO2: 24 mmol/L (ref 20–29)
Calcium: 9.4 mg/dL (ref 8.6–10.2)
Chloride: 103 mmol/L (ref 96–106)
Creatinine, Ser: 1.1 mg/dL (ref 0.76–1.27)
Globulin, Total: 2.2 g/dL (ref 1.5–4.5)
Glucose: 91 mg/dL (ref 70–99)
Potassium: 4.2 mmol/L (ref 3.5–5.2)
Sodium: 143 mmol/L (ref 134–144)
Total Protein: 6 g/dL (ref 6.0–8.5)
eGFR: 71 mL/min/{1.73_m2} (ref >59)

## 2021-07-10 LAB — TSH: TSH: 18.2 u[IU]/mL — ABNORMAL HIGH (ref 0.450–4.500)

## 2021-07-10 LAB — T4, FREE: Free T4: 0.92 ng/dL (ref 0.82–1.77)

## 2021-07-14 ENCOUNTER — Telehealth: Payer: Self-pay | Admitting: Cardiology

## 2021-07-14 NOTE — Telephone Encounter (Signed)
Patient calling for thyroid test results

## 2021-07-14 NOTE — Telephone Encounter (Signed)
Biotronik alert received for VT1 zone detection with duration 2 minutes 25 seconds w/ ATP X6.   Patient reports of dizziness approx. 1 minute during episodes, no loc. Denies chest pain, shortness of breath or palpations. Reports compliance with medications. Denies fluid retention symptoms.   [place] plan reviewed with patient with verbal understanding. [place] driving restrictions x6 months, verbalized understanding.   Routing to Dr. Quentin Ore for review and recommendations.

## 2021-07-15 ENCOUNTER — Telehealth: Payer: Self-pay | Admitting: Cardiology

## 2021-07-15 NOTE — Telephone Encounter (Signed)
Discussed with Dr. Quentin Ore.  Would like Pt to be evaluated by Dr. Lovena Le and start mexilitine.  Scheduling to contact Pt.

## 2021-07-15 NOTE — Telephone Encounter (Signed)
Patient  wife calling to discuss recent tsh  testing results   Please call to discuss POC

## 2021-07-15 NOTE — Telephone Encounter (Signed)
Outreach made to Pt.  Requested Pt be seen today by GT.  Pt refused appt today.  Appt made for Pt tomorrow with CL in [place].

## 2021-07-15 NOTE — Progress Notes (Signed)
Electrophysiology Office Follow up Visit Note:    Date:  07/16/2021   ID:  Garrett Hunter, DOB Nov 28, 1945, MRN [id number]  PCP:  Derinda Late, MD  [facility] Cardiologist:  None  [facility] Electrophysiologist:  [name], MD    Interval History:    Garrett Hunter is a 75 y.o. male who presents for a follow up visit.  I last saw the patient July 09, 2021 for ventricular tachycardia.  He has a Biotronik VDD ICD in situ.  He has had multiple treatments from his ICD.  All treatments have been ATP.  No shocks have been delivered.  This is despite treatment with amiodarone.  He has a history of ischemic cardiomyopathy with a CTO of the right coronary artery and left circumflex.  Today he is doing well.  He tells me that during the ventricular tachycardia episode he did feel lightheaded but did not lose consciousness.   Past Medical History:  Diagnosis Date   AAA (abdominal aortic aneurysm)    thoracoabdominal aortic aneursym, s/p right ilio-femoral bypass 10/12/16; 3V FEVAR with SMA, RRA, and LRA stenting 11/16/16 by Dr. Sammuel Hines [facility])   Benign neoplasm of colon    patient not sure   CHF (congestive heart failure) (HCC)    EF 45-50%   Chronic airway obstruction, not elsewhere classified    pt denies   Coronary artery disease    Inferior MI in 1996 treated with TPA. Cardiac cath in 2011 at [facility] showed chronic occlusion of the proximal RCA and proximal left circumflex without obstructive disease in the LAD. Non-ST elevation myocardial infarction in October 2013 while on vacation in [place]. Cardiac catheterization showed plaque rupture in the proximal ramus with thrombus. He had balloon angioplasty done. 01/28/21 cath    Coronary atherosclerosis of native coronary artery    Hepatitis A    teenager, no longer a problem   Hyperlipidemia    Intolerance to statins.   Hypertension    MI (myocardial infarction) ([place])    x 3 - last one 2013   Renal artery stenosis (HCC)     s/p bilateral renal artery angioplasty 07/26/18   Stroke ([place]) 01/2021   Ventricular tachycardia    s/p cardioverson 01/27/21    Past Surgical History:  Procedure Laterality Date   ABDOMINAL AORTIC ANEURYSM REPAIR     11/16/2016   ANGIOPLASTY     CARDIAC CATHETERIZATION  2011   CARDIAC CATHETERIZATION  2/14   [facility]; 95% lesion in the ramus intermedius.    CATARACT EXTRACTION W/PHACO Left 12/19/2018   Procedure: CATARACT EXTRACTION PHACO AND INTRAOCULAR LENS PLACEMENT ([place])  LEFT;  Surgeon: Eulogio Bear, MD;  Location: [place];  Service: Ophthalmology;  Laterality: Left;   CATARACT EXTRACTION W/PHACO Right 03/06/2019   Procedure: CATARACT EXTRACTION PHACO AND INTRAOCULAR LENS PLACEMENT (IOC)RIGHT;  Surgeon: Eulogio Bear, MD;  Location: [place];  Service: Ophthalmology;  Laterality: Right;   COLONOSCOPY WITH PROPOFOL N/A 03/20/2021   Procedure: COLONOSCOPY WITH PROPOFOL;  Surgeon: Lin Landsman, MD;  Location: [facility] ENDOSCOPY;  Service: Gastroenterology;  Laterality: N/A;   CORONARY ANGIOPLASTY  1996   [facility] x1   CORONARY ANGIOPLASTY  2/14   Severe restenosis in the ostial ramus. Status post angioplasty and drug-eluting stent placement with a 2.5 x 18 mm Xience drug-eluting stent   ESOPHAGOGASTRODUODENOSCOPY N/A 08/03/2018   Procedure: ESOPHAGOGASTRODUODENOSCOPY (EGD);  Surgeon: Lin Landsman, MD;  [facility] ENDOSCOPY;  Service: Gastroenterology;  Laterality: N/A;  ESOPHAGOGASTRODUODENOSCOPY (EGD) WITH PROPOFOL N/A 11/03/2018   Procedure: ESOPHAGOGASTRODUODENOSCOPY (EGD) WITH PROPOFOL;  Surgeon: Lin Landsman, MD;  Location: [place];  Service: Gastroenterology;  Laterality: N/A;   ESOPHAGOGASTRODUODENOSCOPY (EGD) WITH PROPOFOL N/A 12/06/2018   Procedure: ESOPHAGOGASTRODUODENOSCOPY (EGD) WITH PROPOFOL;  Surgeon: Lucilla Lame, MD;  Location: [facility] ENDOSCOPY;  Service: Endoscopy;  Laterality: N/A;   ESOPHAGOGASTRODUODENOSCOPY (EGD)  WITH PROPOFOL N/A 03/19/2021   Procedure: ESOPHAGOGASTRODUODENOSCOPY (EGD) WITH PROPOFOL;  Surgeon: Lin Landsman, MD;  Location: [facility] ENDOSCOPY;  Service: Gastroenterology;  Laterality: N/A;   ICD IMPLANT N/A 05/14/2021   Procedure: ICD IMPLANT;  Surgeon: [name], MD;  Location: [place] CV LAB;  Service: Cardiovascular;  Laterality: N/A;   IR CT HEAD LTD  01/30/2021   IR PERCUTANEOUS ART THROMBECTOMY/INFUSION INTRACRANIAL INC DIAG ANGIO  01/30/2021   LEFT HEART CATH AND CORONARY ANGIOGRAPHY N/A 01/28/2021   Procedure: LEFT HEART CATH AND CORONARY ANGIOGRAPHY;  Surgeon: Nelva Bush, MD;  Location: [place] CV LAB;  Service: Cardiovascular;  Laterality: N/A;   RADIOLOGY WITH ANESTHESIA N/A 01/30/2021   Procedure: IR WITH ANESTHESIA;  Surgeon: Luanne Bras, MD;  Location: [place];  Service: Radiology;  Laterality: N/A;   TONSILLECTOMY AND ADENOIDECTOMY     TRANSCAROTID ARTERY REVASCULARIZATION  Left 02/07/2021   Procedure: TRANSCAROTID ARTERY REVASCULARIZATION LEFT;  Surgeon: [name], MD;  Location: [facility];  Service: Vascular;  Laterality: Left;    Current Medications: Current Meds  Medication Sig   amiodarone (PACERONE) 200 MG tablet Take 1 tablet (200 mg total) by mouth daily.   carvedilol (COREG) 12.5 MG tablet TAKE 1 TABLET TWICE A DAY   Coenzyme Q10 (COQ10) 100 MG CAPS Take 1 capsule by mouth every evening.   FARXIGA 10 MG TABS tablet Take 10 mg by mouth daily.   furosemide (LASIX) 40 MG tablet Take 40 mg by mouth.   Multiple Vitamin (MULTIVITAMIN WITH MINERALS) TABS tablet Take 1 tablet by mouth daily.   pantoprazole (PROTONIX) 40 MG tablet Take 1 tablet (40 mg total) by mouth daily.   rosuvastatin (CRESTOR) 5 MG tablet TAKE 1 TABLET DAILY   sacubitril-valsartan (ENTRESTO) 24-26 MG Take 1 tablet by mouth 2 (two) times daily.   vitamin C (ASCORBIC ACID) 500 MG tablet Take 500 mg by mouth daily.     Allergies:   Crestor [rosuvastatin], Livalo  [pitavastatin], Pravachol [pravastatin sodium], and Zocor [simvastatin]   Social History   Socioeconomic History   Marital status: Married    Spouse name: Not on file   Number of children: 4   Years of education: Not on file   Highest education level: Not on file  Occupational History   Not on file  Tobacco Use   Smoking status: Former    Packs/day: 2.00    Years: 40.00    Pack years: 80.00    Types: Cigarettes    Quit date: 2004    Years since quitting: 18.7   Smokeless tobacco: Former    Types: Nurse, children's Use: Never used  Substance and Sexual Activity   Alcohol use: Not Currently    Alcohol/week: 3.0 standard drinks    Types: 3 Cans of beer per week    Comment: occassionally   Drug use: No   Sexual activity: Yes  Other Topics Concern   Not on file  Social History Narrative   Lives at home with wife, works   Investment banker, operational of Radio broadcast assistant Strain: Not on  file  Food Insecurity: Not on file  Transportation Needs: Not on file  Physical Activity: Not on file  Stress: Not on file  Social Connections: Not on file     Family History: The patient's family history includes Cancer (age of onset: 40) in his mother; Heart attack in his father; Hypertension in his brother and sister.  ROS:   Please see the history of present illness.    All other systems reviewed and are negative.  EKGs/Labs/Other Studies Reviewed:    The following studies were reviewed today:  January 28, 2021 echo Left ventricular function mildly decreased, 45% Right ventricular function normal Trivial MR  July 16, 2021 in clinic device interrogation with reprogramming Lead parameters are stable Increased ATP attempts in VT 1 and VT2 zones EGM from VT episode reviewed and shows VT with a cycle length of 360 ms.  Appears monomorphic.  EKG:  The ekg ordered today demonstrates sinus rhythm with a first-degree AV delay.  Recent Labs: 03/17/2021: Magnesium  2.0 03/20/2021: Hemoglobin 9.3; Platelets 223 07/09/2021: ALT 15; BUN 11; Creatinine, Ser 1.10; Potassium 4.2; Sodium 143; TSH 18.200  Recent Lipid Panel    Component Value Date/Time   CHOL 119 01/30/2021 0530   CHOL 121 09/13/2012 0819   TRIG 85 01/30/2021 0530   HDL 38 (L) 01/30/2021 0530   HDL 37 (L) 09/13/2012 0819   CHOLHDL 3.1 01/30/2021 0530   VLDL 17 01/30/2021 0530   LDLCALC 64 01/30/2021 0530   LDLCALC 68 09/13/2012 0819    Physical Exam:    VS:  BP 130/70 (BP Location: Left Arm, Patient Position: Sitting, Cuff Size: Normal)   Pulse 68   Ht 5\' 11"  (1.803 m)   Wt 198 lb (89.8 kg)   SpO2 98%   BMI 27.62 kg/m     Wt Readings from Last 3 Encounters:  07/16/21 198 lb (89.8 kg)  07/09/21 198 lb (89.8 kg)  05/29/21 202 lb 6 oz (91.8 kg)     GEN:  Well nourished, well developed in no acute distress HEENT: Normal NECK: No JVD; No carotid bruits LYMPHATICS: No lymphadenopathy CARDIAC: RRR, no murmurs, rubs, gallops.  ICD pocket well-healed RESPIRATORY:  Clear to auscultation without rales, wheezing or rhonchi  ABDOMEN: Soft, non-tender, non-distended MUSCULOSKELETAL:  No edema; No deformity  SKIN: Warm and dry NEUROLOGIC:  Alert and oriented x 3.  Word finding difficulties stable. PSYCHIATRIC:  Normal affect   ASSESSMENT:    1. Sustained VT (ventricular tachycardia)   2. Chronic systolic congestive heart failure ([place])   3. Ventricular tachycardia   4. Encounter for long-term (current) use of high-risk medication    PLAN:    In order of problems listed above:  #Ventricular tachycardia Has received additional ATP therapies since I last saw him.  ATP is effective at terminating the rhythm after the sixth scheme.  Today I increase the number of ATP schemes delivered in both the VT zones.  I discussed the options for him including adding mexiletine to his amiodarone or going straight to a EP study and ablation of the VT.  We discussed the risks and benefits of  ablation in detail and the likely efficacy.  We discussed the need to approach the left ventricle from the venous side because of his history of extensive peripheral arterial disease with arterial bypasses present.  He would like to start with mexiletine which I think is very reasonable.  We will start 150 mg by mouth twice daily.  I will have  him follow-up in 6 to 8 weeks for blood work only (TSH and free T4).  I will plan to see him back in 3 months.  #Chronic systolic heart failure NYHA class II today.  Warm and dry on exam.  Continue Coreg, Lasix, Entresto.   Medication Adjustments/Labs and Tests Ordered: Current medicines are reviewed at length with the patient today.  Concerns regarding medicines are outlined above.  No orders of the defined types were placed in this encounter.  No orders of the defined types were placed in this encounter.    Signed, Lars Mage, MD, [facility], [facility] 07/16/2021 9:33 AM    Electrophysiology [facility]

## 2021-07-16 ENCOUNTER — Other Ambulatory Visit: Payer: Self-pay

## 2021-07-16 ENCOUNTER — Encounter: Payer: Self-pay | Admitting: Cardiology

## 2021-07-16 ENCOUNTER — Ambulatory Visit (INDEPENDENT_AMBULATORY_CARE_PROVIDER_SITE_OTHER): Payer: Medicare Other | Admitting: Cardiology

## 2021-07-16 VITALS — BP 130/70 | HR 68 | Ht 71.0 in | Wt 198.0 lb

## 2021-07-16 DIAGNOSIS — Z79899 Other long term (current) drug therapy: Secondary | ICD-10-CM | POA: Diagnosis not present

## 2021-07-16 DIAGNOSIS — I472 Ventricular tachycardia, unspecified: Secondary | ICD-10-CM

## 2021-07-16 DIAGNOSIS — I5022 Chronic systolic (congestive) heart failure: Secondary | ICD-10-CM | POA: Diagnosis not present

## 2021-07-16 MED ORDER — MEXILETINE HCL 150 MG PO CAPS: 150.0000 mg | ORAL_CAPSULE | Freq: Two times a day (BID) | ORAL | 3 refills | Status: DC

## 2021-07-16 NOTE — Patient Instructions (Addendum)
Medication Instructions:  Your physician has recommended you make the following change in your medication:    You will start mexiletine 150 mg-  Take one capsule by  mouth twice a day  *If you need a refill on your cardiac medications before your next appointment, please call your pharmacy*  Lab Work: You will get lab work in 6-8 weeks at the [facility].  Any time between November 23-December 7  These are walk in labs-you do not need an appointment and you do not need to be fasting.  If you have labs (blood work) drawn today and your tests are completely normal, you will receive your results only by: [place] (if you have MyChart) OR A paper copy in the mail If you have any lab test that is abnormal or we need to change your treatment, we will call you to review the results.  Testing/Procedures: None ordered.  Follow-Up: At [facility], you and your health needs are our priority.  As part of our continuing mission to provide you with exceptional heart care, we have created designated Provider Care Teams.  These Care Teams include your primary Cardiologist (physician) and Advanced Practice Providers (APPs -  Physician Assistants and Nurse Practitioners) who all work together to provide you with the care you need, when you need it.  Your next appointment:   Your physician wants you to follow-up in: 3 months with Dr. Quentin Ore   Remote monitoring is used to monitor your ICD from home. This monitoring reduces the number of office visits required to check your device to one time per year. It allows [place] to keep an eye on the functioning of your device to ensure it is working properly. You are scheduled for a device check from home on 08/13/2021. You may send your transmission at any time that day. If you have a wireless device, the transmission will be sent automatically. After your physician reviews your transmission, you will receive a postcard with your next transmission  date.  Mexiletine capsules What is this medication? MEXILETINE (mex IL e teen) is an antiarrhythmic agent. This medicine is used to treat irregular heart rhythm and can slow rapid heartbeats. It can help your heart to return to and maintain a normal rhythm. Because of the side effects caused by this medicine, it is usually used for heartbeat problems that may be life-threatening. This medicine may be used for other purposes; ask your health care provider or pharmacist if you have questions. COMMON BRAND NAME(S): Mexitil What should I tell my care team before I take this medication? They need to know if you have any of these conditions: liver disease other heart problems previous heart attack an unusual or allergic reaction to mexiletine, other medicines, foods, dyes, or preservatives pregnant or trying to get pregnant breast-feeding How should I use this medication? Take this medicine by mouth with a glass of water. Follow the directions on the prescription label. It is recommended that you take this medicine with food or an antacid. Take your doses at regular intervals. Do not take your medicine more often than directed. Do not stop taking except on the advice of your doctor or health care professional. Talk to your pediatrician regarding the use of this medicine in children. Special care may be needed. Overdosage: If you think you have taken too much of this medicine contact a poison control center or emergency room at once. NOTE: This medicine is only for you. Do not share this medicine with  others. What if I miss a dose? If you miss a dose, take it as soon as you can. If it is almost time for your next dose, take only that dose. Do not take double or extra doses. What may interact with this medication? Do not take this medicine with any of the following medications: dofetilide This medicine may also interact with the following medications: caffeine cimetidine medicines for depression,  anxiety, or psychotic disturbances medicines to control heart rhythm phenobarbital phenytoin rifampin theophylline This list may not describe all possible interactions. Give your health care provider a list of all the medicines, herbs, non-prescription drugs, or dietary supplements you use. Also tell them if you smoke, drink alcohol, or use illegal drugs. Some items may interact with your medicine. What should I watch for while using this medication? Your condition will be monitored closely when you first begin therapy. Often, this drug is first started in a hospital or other monitored health care setting. Once you are on maintenance therapy, visit your doctor or health care provider for regular checks on your progress. Because your condition and use of this medicine carry some risk, it is a good idea to carry an identification card, necklace or bracelet with details of your condition, medications, and doctor or health care provider. You may get drowsy or dizzy. Do not drive, use machinery, or do anything that needs mental alertness until you know how this medicine affects you. Do not stand or sit up quickly, especially if you are an older patient. This reduces the risk of dizzy or fainting spells. Alcohol can make you more dizzy, increase flushing and rapid heartbeats. Avoid alcoholic drinks. This medicine may cause serious skin reactions. They can happen weeks to months after starting the medicine. Contact your health care provider right away if you notice fevers or flu-like symptoms with a rash. The rash may be red or purple and then turn into blisters or peeling of the skin. Or, you might notice a red rash with swelling of the face, lips or lymph nodes in your neck or under your arms. What side effects may I notice from receiving this medication? Side effects that you should report to your doctor or health care professional as soon as possible: allergic reactions like skin rash, itching or hives,  swelling of the face, lips, or tongue breathing problems chest pain, continued irregular heartbeats rash, fever, and swollen lymph nodes redness, blistering, peeling or loosening of the skin, including inside the mouth seizures skin rash trembling, shaking unusual bleeding or bruising unusually weak or tired Side effects that usually do not require medical attention (report to your doctor or health care professional if they continue or are bothersome): blurred vision difficulty walking heartburn nausea, vomiting nervousness numbness, or tingling in the fingers or toes This list may not describe all possible side effects. Call your doctor for medical advice about side effects. You may report side effects to FDA at 1-800-FDA-1088. Where should I keep my medication? Keep out of reach of children. Store at room temperature between 15 and 30 degrees C (59 and 86 degrees F). Throw away any unused medicine after the expiration date. NOTE: This sheet is a summary. It may not cover all possible information. If you have questions about this medicine, talk to your doctor, pharmacist, or health care provider.  2022 Elsevier/Gold Standard (2018-12-28 09:25:42)

## 2021-07-30 ENCOUNTER — Telehealth: Payer: Self-pay

## 2021-07-30 NOTE — Telephone Encounter (Signed)
Biotronik alert received today for ATP x 4, 3 successful 07/29/21 between 10:48 am and 11:47 am. Morphology consistent for episodes. Successful telephone encounter to patient to assess for s/s. See note from [facility] RN below. Patient states he was feeling well at time of event but aware vitals were not "stable". EMR reviewed and noted mexiletine added at last office visit with Dr. Quentin Ore 07/16/21. Confirmed patient compliance with all medications and dosages listed in EMR including mexiletine, lasix, entresto, farxiga, amio, and coreg. Reviewed [place] driving restrictions and shock plan. ED precautions reviewed. Patient appreciative of follow up. Will route to Dr. Quentin Ore for review and update patient with recommendations if applicable.

## 2021-07-31 ENCOUNTER — Telehealth: Payer: Self-pay | Admitting: Cardiology

## 2021-07-31 NOTE — Telephone Encounter (Signed)
  Patient c/o Palpitations:  High priority if patient c/o lightheadedness, shortness of breath, or chest pain  How long have you had palpitations/irregular HR/ Afib? Are you having the symptoms now?   Are you currently experiencing lightheadedness, SOB or CP?   Do you have a history of afib (atrial fibrillation) or irregular heart rhythm? Yes   Have you checked your BP or HR? (document readings if available): HR 44, 62 BP 82/46, 96/54  Are you experiencing any other symptoms?   Sonja with [facility] said, she visited pt on Tuesday 07/29/21, she said pt had irregular heart beat with a HR of 44 his BP was low 82/46 and while she was on the phone with PCP pt went back to sinus rhythm with HR 62 and BP 96/54. She is saw pt today and pt told her device clinic called him yesterday telling him his defib went off. She is worried and asking Dr. Quentin Ore recommendation and if pt needs to be seen sooner by him

## 2021-07-31 NOTE — Telephone Encounter (Signed)
Returned call to [facility] nurse.  Discussed that she was assessing Pt at the same time Pt was receiving ATP from his ICD-so the pulse rate is not accurate.  Provided education on PPM vs ICD and also educated on what ATP is and does for Pt.  Nurse was thankful for call and education.  Advised she can call back with any questions.

## 2021-08-04 ENCOUNTER — Other Ambulatory Visit: Payer: Self-pay

## 2021-08-04 MED ORDER — ROSUVASTATIN CALCIUM 5 MG PO TABS: 5.0000 mg | ORAL_TABLET | Freq: Every day | ORAL | 0 refills | Status: DC

## 2021-08-04 MED ORDER — CARVEDILOL 12.5 MG PO TABS: 12.5000 mg | ORAL_TABLET | Freq: Two times a day (BID) | ORAL | 0 refills | Status: DC

## 2021-08-13 ENCOUNTER — Encounter: Payer: Medicare Other | Admitting: Cardiology

## 2021-08-13 ENCOUNTER — Ambulatory Visit (INDEPENDENT_AMBULATORY_CARE_PROVIDER_SITE_OTHER): Payer: Medicare Other

## 2021-08-13 DIAGNOSIS — I472 Ventricular tachycardia, unspecified: Secondary | ICD-10-CM | POA: Diagnosis not present

## 2021-08-13 LAB — CUP PACEART REMOTE DEVICE CHECK
Battery Voltage: 3.12 V
Date Time Interrogation Session: 20221109154654
HighPow Impedance: 66 Ohm
Implantable Lead Implant Date: 20220810
Implantable Lead Location: 753860
Implantable Lead Model: 436909
Implantable Lead Serial Number: 81459128
Implantable Pulse Generator Implant Date: 20220810
Lead Channel Impedance Value: 541 Ohm
Lead Channel Setting Pacing Amplitude: 3 V
Lead Channel Setting Pacing Pulse Width: 0.4 ms
Lead Channel Setting Sensing Sensitivity: 0.8 mV
Pulse Gen Model: 429525
Pulse Gen Serial Number: 84855891

## 2021-08-19 NOTE — Progress Notes (Signed)
Electrophysiology Office Follow up Visit Note:    Date:  08/20/2021   ID:  Garrett Hunter, DOB [birth year], MRN [id number]  PCP:  Derinda Late, MD  [facility] Cardiologist:  None  [facility] Electrophysiologist:  [name], MD    Interval History:    Garrett Hunter is a 75 y.o. male who presents for a follow up visit. They were last seen in clinic 07/16/2021 for sustained VT. Despite treatment with amiodarone and mexiletine he continues to receive ATP therapy from his device. On his recent interrogation there were 4 episodes of VT treated with ATP.   Today he tells me he did not feel the episodes of VT.  No syncope.  No lightheadedness.     Past Medical History:  Diagnosis Date   AAA (abdominal aortic aneurysm)    thoracoabdominal aortic aneursym, s/p right ilio-femoral bypass 10/12/16; 3V FEVAR with SMA, RRA, and LRA stenting 11/16/16 by Dr. Sammuel Hines [facility])   Benign neoplasm of colon    patient not sure   CHF (congestive heart failure) (HCC)    EF 45-50%   Chronic airway obstruction, not elsewhere classified    pt denies   Coronary artery disease    Inferior MI in 1996 treated with TPA. Cardiac cath in 2011 at [facility] showed chronic occlusion of the proximal RCA and proximal left circumflex without obstructive disease in the LAD. Non-ST elevation myocardial infarction in October 2013 while on vacation in [place]. Cardiac catheterization showed plaque rupture in the proximal ramus with thrombus. He had balloon angioplasty done. 01/28/21 cath    Coronary atherosclerosis of native coronary artery    Hepatitis A    teenager, no longer a problem   Hyperlipidemia    Intolerance to statins.   Hypertension    MI (myocardial infarction) ([place])    x 3 - last one 2013   Renal artery stenosis (HCC)    s/p bilateral renal artery angioplasty 07/26/18   Stroke ([place]) 01/2021   Ventricular tachycardia    s/p cardioverson 01/27/21    Past Surgical History:  Procedure  Laterality Date   ABDOMINAL AORTIC ANEURYSM REPAIR     11/16/2016   ANGIOPLASTY     CARDIAC CATHETERIZATION  2011   CARDIAC CATHETERIZATION  2/14   [facility]; 95% lesion in the ramus intermedius.    CATARACT EXTRACTION W/PHACO Left 12/19/2018   Procedure: CATARACT EXTRACTION PHACO AND INTRAOCULAR LENS PLACEMENT ([place])  LEFT;  Surgeon: Eulogio Bear, MD;  Location: [place];  Service: Ophthalmology;  Laterality: Left;   CATARACT EXTRACTION W/PHACO Right 03/06/2019   Procedure: CATARACT EXTRACTION PHACO AND INTRAOCULAR LENS PLACEMENT (IOC)RIGHT;  Surgeon: Eulogio Bear, MD;  Location: [place];  Service: Ophthalmology;  Laterality: Right;   COLONOSCOPY WITH PROPOFOL N/A 03/20/2021   Procedure: COLONOSCOPY WITH PROPOFOL;  Surgeon: Lin Landsman, MD;  [facility] ENDOSCOPY;  Service: Gastroenterology;  Laterality: N/A;   CORONARY ANGIOPLASTY  1996   [facility] x1   CORONARY ANGIOPLASTY  2/14   Severe restenosis in the ostial ramus. Status post angioplasty and drug-eluting stent placement with a 2.5 x 18 mm Xience drug-eluting stent   ESOPHAGOGASTRODUODENOSCOPY N/A 08/03/2018   Procedure: ESOPHAGOGASTRODUODENOSCOPY (EGD);  Surgeon: Lin Landsman, MD;  [facility] ENDOSCOPY;  Service: Gastroenterology;  Laterality: N/A;   ESOPHAGOGASTRODUODENOSCOPY (EGD) WITH PROPOFOL N/A 11/03/2018   Procedure: ESOPHAGOGASTRODUODENOSCOPY (EGD) WITH PROPOFOL;  Surgeon: Lin Landsman, MD;  Location: [facility] ENDOSCOPY;  Service: Gastroenterology;  Laterality: N/A;   ESOPHAGOGASTRODUODENOSCOPY (  EGD) WITH PROPOFOL N/A 12/06/2018   Procedure: ESOPHAGOGASTRODUODENOSCOPY (EGD) WITH PROPOFOL;  Surgeon: Lucilla Lame, MD;  Location: [facility] ENDOSCOPY;  Service: Endoscopy;  Laterality: N/A;   ESOPHAGOGASTRODUODENOSCOPY (EGD) WITH PROPOFOL N/A 03/19/2021   Procedure: ESOPHAGOGASTRODUODENOSCOPY (EGD) WITH PROPOFOL;  Surgeon: Lin Landsman, MD;  Location: [facility] ENDOSCOPY;  Service:  Gastroenterology;  Laterality: N/A;   ICD IMPLANT N/A 05/14/2021   Procedure: ICD IMPLANT;  Surgeon: [name], MD;  Location: [place] CV LAB;  Service: Cardiovascular;  Laterality: N/A;   IR CT HEAD LTD  01/30/2021   IR PERCUTANEOUS ART THROMBECTOMY/INFUSION INTRACRANIAL INC DIAG ANGIO  01/30/2021   LEFT HEART CATH AND CORONARY ANGIOGRAPHY N/A 01/28/2021   Procedure: LEFT HEART CATH AND CORONARY ANGIOGRAPHY;  Surgeon: Nelva Bush, MD;  Location: [place] CV LAB;  Service: Cardiovascular;  Laterality: N/A;   RADIOLOGY WITH ANESTHESIA N/A 01/30/2021   Procedure: IR WITH ANESTHESIA;  Surgeon: Luanne Bras, MD;  Location: [place];  Service: Radiology;  Laterality: N/A;   TONSILLECTOMY AND ADENOIDECTOMY     TRANSCAROTID ARTERY REVASCULARIZATION  Left 02/07/2021   Procedure: TRANSCAROTID ARTERY REVASCULARIZATION LEFT;  Surgeon: [name], MD;  Location: [facility] OR;  Service: Vascular;  Laterality: Left;    Current Medications: Current Meds  Medication Sig   carvedilol (COREG) 12.5 MG tablet Take 1 tablet (12.5 mg total) by mouth 2 (two) times daily.   clopidogrel (PLAVIX) 75 MG tablet Take 75 mg by mouth daily.   Coenzyme Q10 (COQ10) 100 MG CAPS Take 1 capsule by mouth every evening.   mexiletine (MEXITIL) 150 MG capsule Take 1 capsule (150 mg total) by mouth 2 (two) times daily.   Multiple Vitamin (MULTIVITAMIN WITH MINERALS) TABS tablet Take 1 tablet by mouth daily.   pantoprazole (PROTONIX) 40 MG tablet Take 1 tablet (40 mg total) by mouth daily.   rosuvastatin (CRESTOR) 5 MG tablet Take 1 tablet (5 mg total) by mouth daily.   sacubitril-valsartan (ENTRESTO) 24-26 MG Take 1 tablet by mouth 2 (two) times daily.   vitamin C (ASCORBIC ACID) 500 MG tablet Take 500 mg by mouth daily.   [DISCONTINUED] amiodarone (PACERONE) 200 MG tablet Take 1 tablet (200 mg total) by mouth daily.   [DISCONTINUED] FARXIGA 10 MG TABS tablet Take 10 mg by mouth daily.     Allergies:    Crestor [rosuvastatin], Livalo [pitavastatin], Pravachol [pravastatin sodium], and Zocor [simvastatin]   Social History   Socioeconomic History   Marital status: Married    Spouse name: Not on file   Number of children: 4   Years of education: Not on file   Highest education level: Not on file  Occupational History   Not on file  Tobacco Use   Smoking status: Former    Packs/day: 2.00    Years: 40.00    Pack years: 80.00    Types: Cigarettes    Quit date: 2004    Years since quitting: 18.8   Smokeless tobacco: Former    Types: Nurse, children's Use: Never used  Substance and Sexual Activity   Alcohol use: Not Currently    Alcohol/week: 3.0 standard drinks    Types: 3 Cans of beer per week    Comment: occassionally   Drug use: No   Sexual activity: Yes  Other Topics Concern   Not on file  Social History Narrative   Lives at home with wife, works   Investment banker, operational of Radio broadcast assistant Strain: Not  on file  Food Insecurity: Not on file  Transportation Needs: Not on file  Physical Activity: Not on file  Stress: Not on file  Social Connections: Not on file     Family History: The patient's family history includes Cancer (age of onset: 61) in his mother; Heart attack in his father; Hypertension in his brother and sister.  ROS:   Please see the history of present illness.    All other systems reviewed and are negative.  EKGs/Labs/Other Studies Reviewed:    The following studies were reviewed today:  August 20, 2021 in clinic device interrogation personally reviewed Battery longevity good.  Device functioning appropriately.  Lead parameter stable.  0% pacing. 3 episodes of VT treated with ATP on October 25   EKG:  The ekg ordered today demonstrates sinus rhythm  Recent Labs: 03/17/2021: Magnesium 2.0 03/20/2021: Hemoglobin 9.3; Platelets 223 07/09/2021: ALT 15; BUN 11; Creatinine, Ser 1.10; Potassium 4.2; Sodium 143; TSH 18.200  Recent  Lipid Panel    Component Value Date/Time   CHOL 119 01/30/2021 0530   CHOL 121 09/13/2012 0819   TRIG 85 01/30/2021 0530   HDL 38 (L) 01/30/2021 0530   HDL 37 (L) 09/13/2012 0819   CHOLHDL 3.1 01/30/2021 0530   VLDL 17 01/30/2021 0530   LDLCALC 64 01/30/2021 0530   LDLCALC 68 09/13/2012 0819    Physical Exam:    VS:  BP (!) 146/78 (BP Location: Left Arm, Patient Position: Sitting, Cuff Size: Normal)   Pulse 64   Ht 5\' 11"  (1.803 m)   Wt 200 lb (90.7 kg)   SpO2 98%   BMI 27.89 kg/m     Wt Readings from Last 3 Encounters:  08/20/21 200 lb (90.7 kg)  07/16/21 198 lb (89.8 kg)  07/09/21 198 lb (89.8 kg)     GEN:  Well nourished, well developed in no acute distress HEENT: Normal NECK: No JVD; No carotid bruits LYMPHATICS: No lymphadenopathy CARDIAC: RRR, no murmurs, rubs, gallops.  ICD pocket well-healed. RESPIRATORY:  Clear to auscultation without rales, wheezing or rhonchi  ABDOMEN: Soft, non-tender, non-distended MUSCULOSKELETAL:  No edema; No deformity  SKIN: Warm and dry NEUROLOGIC:  Alert and oriented x 3.  Expressive aphasia. PSYCHIATRIC:  Normal affect        ASSESSMENT:    1. Sustained VT (ventricular tachycardia)   2. Chronic systolic congestive heart failure ([place])   3. Primary hypertension   4. ICD (implantable cardioverter-defibrillator) in place   5. Ischemic cardiomyopathy   6. Paroxysmal VT    PLAN:    In order of problems listed above:  1. Sustained VT (ventricular tachycardia) Receiving appropriate therapy from his ICD.  I will increase his amiodarone to 400 mg by mouth once a day in addition to his mexiletine 150 mg by mouth twice daily.  We will continue remotely monitoring.  I will plan to see him back in February.  If he continues to have ventricular tachycardia, we may consider proceeding with EP study and possible ablation/scar modification.  This was discussed again with the patient in detail during today's visit.  2. Chronic systolic  congestive heart failure (HCC) NYHA class II.  Warm and dry.  Continue Coreg, Lisabeth Register.  3. Primary hypertension Slightly above goal.  Continue current regimen.  4. ICD (implantable cardioverter-defibrillator) in place Functioning appropriately.  Continue remote monitoring.  5. Ischemic cardiomyopathy No ischemic symptoms.  See above.  6. Paroxysmal VT See #1.   Medication Adjustments/Labs and Tests Ordered: Current  medicines are reviewed at length with the patient today.  Concerns regarding medicines are outlined above.  Orders Placed This Encounter  Procedures   EKG 12-Lead   Meds ordered this encounter  Medications   FARXIGA 10 MG TABS tablet    Sig: Take 1 tablet (10 mg total) by mouth daily.    Dispense:  90 tablet    Refill:  3   amiodarone (PACERONE) 200 MG tablet    Sig: Take 1 tablet (200 mg total) by mouth 2 (two) times daily.    Dispense:  180 tablet    Refill:  3     Signed, Lars Mage, MD, [facility], [facility] 08/20/2021 12:30 PM    Electrophysiology [facility]

## 2021-08-20 ENCOUNTER — Other Ambulatory Visit: Payer: Self-pay | Admitting: Neurology

## 2021-08-20 ENCOUNTER — Ambulatory Visit (INDEPENDENT_AMBULATORY_CARE_PROVIDER_SITE_OTHER): Payer: Medicare Other | Admitting: Cardiology

## 2021-08-20 ENCOUNTER — Other Ambulatory Visit: Payer: Self-pay

## 2021-08-20 ENCOUNTER — Encounter: Payer: Self-pay | Admitting: Cardiology

## 2021-08-20 VITALS — BP 146/78 | HR 64 | Ht 71.0 in | Wt 200.0 lb

## 2021-08-20 DIAGNOSIS — I472 Ventricular tachycardia, unspecified: Secondary | ICD-10-CM

## 2021-08-20 DIAGNOSIS — I4729 Other ventricular tachycardia: Secondary | ICD-10-CM

## 2021-08-20 DIAGNOSIS — Z95810 Presence of automatic (implantable) cardiac defibrillator: Secondary | ICD-10-CM

## 2021-08-20 DIAGNOSIS — I10 Essential (primary) hypertension: Secondary | ICD-10-CM

## 2021-08-20 DIAGNOSIS — I255 Ischemic cardiomyopathy: Secondary | ICD-10-CM | POA: Diagnosis not present

## 2021-08-20 DIAGNOSIS — I5022 Chronic systolic (congestive) heart failure: Secondary | ICD-10-CM | POA: Diagnosis not present

## 2021-08-20 DIAGNOSIS — I69320 Aphasia following cerebral infarction: Secondary | ICD-10-CM

## 2021-08-20 LAB — CUP PACEART INCLINIC DEVICE CHECK
Battery Voltage: 3.12 V
Brady Statistic RV Percent Paced: 0 %
Date Time Interrogation Session: 20221116121631
HighPow Impedance: 68 Ohm
Implantable Lead Implant Date: 20220810
Implantable Lead Location: 753860
Implantable Lead Model: 436909
Implantable Lead Serial Number: 81459128
Implantable Pulse Generator Implant Date: 20220810
Lead Channel Impedance Value: 541 Ohm
Lead Channel Pacing Threshold Amplitude: 0.9 V
Lead Channel Pacing Threshold Pulse Width: 0.4 ms
Lead Channel Sensing Intrinsic Amplitude: 22.4 mV
Lead Channel Sensing Intrinsic Amplitude: 6.4 mV
Lead Channel Setting Pacing Amplitude: 1.9 V
Lead Channel Setting Pacing Pulse Width: 0.4 ms
Lead Channel Setting Sensing Sensitivity: 0.8 mV
Pulse Gen Model: 429525
Pulse Gen Serial Number: 84855891

## 2021-08-20 LAB — PACEMAKER DEVICE OBSERVATION

## 2021-08-20 MED ORDER — FARXIGA 10 MG PO TABS: 10.0000 mg | ORAL_TABLET | Freq: Every day | ORAL | 3 refills | Status: DC

## 2021-08-20 MED ORDER — AMIODARONE HCL 200 MG PO TABS: 200.0000 mg | ORAL_TABLET | Freq: Two times a day (BID) | ORAL | 3 refills | Status: DC

## 2021-08-20 NOTE — Patient Instructions (Addendum)
Medication Instructions:  Your physician has recommended you make the following change in your medication:    INCREASE your amiodarone 200 mg-  Take 400 mg by mouth daily  Lab Work: None ordered. If you have labs (blood work) drawn today and your tests are completely normal, you will receive your results only by: [place] (if you have MyChart) OR A paper copy in the mail If you have any lab test that is abnormal or we need to change your treatment, we will call you to review the results.  Testing/Procedures: None ordered.  Follow-Up: At [facility], you and your health needs are our priority.  As part of our continuing mission to provide you with exceptional heart care, we have created designated Provider Care Teams.  These Care Teams include your primary Cardiologist (physician) and Advanced Practice Providers (APPs -  Physician Assistants and Nurse Practitioners) who all work together to provide you with the care you need, when you need it.  Your next appointment:   Your physician wants you to follow-up in: February with Dr. Quentin Ore   November 19, 2021 at 9:00 am  Remote monitoring is used to monitor your ICD from home. This monitoring reduces the number of office visits required to check your device to one time per year. It allows [place] to keep an eye on the functioning of your device to ensure it is working properly. You are scheduled for a device check from home on 11/12/2021. You may send your transmission at any time that day. If you have a wireless device, the transmission will be sent automatically. After your physician reviews your transmission, you will receive a postcard with your next transmission date.

## 2021-08-21 NOTE — Progress Notes (Signed)
Remote ICD transmission.   

## 2021-09-11 ENCOUNTER — Encounter: Payer: Medicare Other | Admitting: Speech Pathology

## 2021-09-15 ENCOUNTER — Telehealth: Payer: Self-pay

## 2021-09-15 NOTE — Telephone Encounter (Signed)
[employer] alert received for VT1 detected 09/13/21 at 9:17 pm and terminated with ATPx1. Duration 1 min 10 seconds. Successful telephone encounter to patient. Patient was unaware of slow VT with therapy. Denies symptoms. Medications reviewed and patient compliant with increased dose of Amio to 400mg  daily, Coreg 12.5 bid, mexiletine 150 mg bid, and entresto 24-26 mg bid. [place] driving restrictions and shock plan reviewed. Will route to Dr. Quentin Ore for review.

## 2021-09-16 ENCOUNTER — Encounter: Payer: Medicare Other | Admitting: Speech Pathology

## 2021-09-18 ENCOUNTER — Encounter: Payer: Medicare Other | Admitting: Speech Pathology

## 2021-09-22 ENCOUNTER — Encounter: Payer: Medicare Other | Admitting: Speech Pathology

## 2021-09-25 ENCOUNTER — Encounter: Payer: Medicare Other | Admitting: Speech Pathology

## 2021-09-26 ENCOUNTER — Other Ambulatory Visit: Payer: Self-pay | Admitting: Cardiology

## 2021-09-26 DIAGNOSIS — I4729 Other ventricular tachycardia: Secondary | ICD-10-CM

## 2021-09-30 ENCOUNTER — Encounter: Payer: Medicare Other | Admitting: Speech Pathology

## 2021-10-02 ENCOUNTER — Encounter: Payer: Medicare Other | Admitting: Speech Pathology

## 2021-10-06 ENCOUNTER — Other Ambulatory Visit: Payer: Self-pay | Admitting: Cardiovascular Disease

## 2021-10-07 MED ORDER — ROSUVASTATIN CALCIUM 5 MG PO TABS: 5.0000 mg | ORAL_TABLET | Freq: Every day | ORAL | 3 refills | Status: DC

## 2021-10-08 ENCOUNTER — Ambulatory Visit: Payer: Medicare Other | Attending: Family Medicine | Admitting: Speech Pathology

## 2021-10-08 ENCOUNTER — Encounter: Payer: Medicare Other | Admitting: Speech Pathology

## 2021-10-08 ENCOUNTER — Other Ambulatory Visit: Payer: Self-pay

## 2021-10-08 DIAGNOSIS — R4701 Aphasia: Secondary | ICD-10-CM | POA: Insufficient documentation

## 2021-10-08 DIAGNOSIS — I63412 Cerebral infarction due to embolism of left middle cerebral artery: Secondary | ICD-10-CM | POA: Insufficient documentation

## 2021-10-08 DIAGNOSIS — I6330 Cerebral infarction due to thrombosis of unspecified cerebral artery: Secondary | ICD-10-CM | POA: Diagnosis present

## 2021-10-08 DIAGNOSIS — R482 Apraxia: Secondary | ICD-10-CM | POA: Diagnosis present

## 2021-10-09 NOTE — Therapy (Signed)
[place] MAIN [facility] SERVICES [address] [place], [place], [address] Phone: [phone]   Fax:  [phone]  Speech Language Pathology Evaluation  Patient Details  Name: Garrett Hunter MRN: [id number] Date of Birth: Jun 07, 1946 Referring Provider (SLP): Derinda Late   Encounter Date: 10/08/2021   End of Session - 10/09/21 1631     Visit Number 1    Number of Visits 25    Date for SLP Re-Evaluation 01/01/22    Authorization Type Medicare    Authorization Time Period 10/08/2021 thru 01/01/2022    Authorization - Visit Number 1    Progress Note Due on Visit 10    SLP Start Time 1500    SLP Stop Time  1600    SLP Time Calculation (min) 60 min    Activity Tolerance Patient tolerated treatment well             Past Medical History:  Diagnosis Date   AAA (abdominal aortic aneurysm)    thoracoabdominal aortic aneursym, s/p right ilio-femoral bypass 10/12/16; 3V FEVAR with SMA, RRA, and LRA stenting 11/16/16 by Dr. Sammuel Hines [facility])   Benign neoplasm of colon    patient not sure   CHF (congestive heart failure) (HCC)    EF 45-50%   Chronic airway obstruction, not elsewhere classified    pt denies   Coronary artery disease    Inferior MI in 1996 treated with TPA. Cardiac cath in 2011 at [facility] showed chronic occlusion of the proximal RCA and proximal left circumflex without obstructive disease in the LAD. Non-ST elevation myocardial infarction in October 2013 while on vacation in [place]. Cardiac catheterization showed plaque rupture in the proximal ramus with thrombus. He had balloon angioplasty done. 01/28/21 cath    Coronary atherosclerosis of native coronary artery    Hepatitis A    teenager, no longer a problem   Hyperlipidemia    Intolerance to statins.   Hypertension    MI (myocardial infarction) ([place])    x 3 - last one 2013   Renal artery stenosis (HCC)    s/p bilateral renal artery angioplasty 07/26/18   Stroke ([place]) 01/2021    Ventricular tachycardia    s/p cardioverson 01/27/21    Past Surgical History:  Procedure Laterality Date   ABDOMINAL AORTIC ANEURYSM REPAIR     11/16/2016   ANGIOPLASTY     CARDIAC CATHETERIZATION  2011   CARDIAC CATHETERIZATION  2/14   [facility]; 95% lesion in the ramus intermedius.    CATARACT EXTRACTION W/PHACO Left 12/19/2018   Procedure: CATARACT EXTRACTION PHACO AND INTRAOCULAR LENS PLACEMENT ([place])  LEFT;  Surgeon: Eulogio Bear, MD;  Location: [place];  Service: Ophthalmology;  Laterality: Left;   CATARACT EXTRACTION W/PHACO Right 03/06/2019   Procedure: CATARACT EXTRACTION PHACO AND INTRAOCULAR LENS PLACEMENT (IOC)RIGHT;  Surgeon: Eulogio Bear, MD;  Location: [place];  Service: Ophthalmology;  Laterality: Right;   COLONOSCOPY WITH PROPOFOL N/A 03/20/2021   Procedure: COLONOSCOPY WITH PROPOFOL;  Surgeon: Lin Landsman, MD;  Location: [facility] ENDOSCOPY;  Service: Gastroenterology;  Laterality: N/A;   CORONARY ANGIOPLASTY  1996   [facility] x1   CORONARY ANGIOPLASTY  2/14   Severe restenosis in the ostial ramus. Status post angioplasty and drug-eluting stent placement with a 2.5 x 18 mm Xience drug-eluting stent   ESOPHAGOGASTRODUODENOSCOPY N/A 08/03/2018   Procedure: ESOPHAGOGASTRODUODENOSCOPY (EGD);  Surgeon: Lin Landsman, MD;  Location: [facility] ENDOSCOPY;  Service: Gastroenterology;  Laterality: N/A;   ESOPHAGOGASTRODUODENOSCOPY (EGD)  WITH PROPOFOL N/A 11/03/2018   Procedure: ESOPHAGOGASTRODUODENOSCOPY (EGD) WITH PROPOFOL;  Surgeon: Lin Landsman, MD;  Location: [facility] ENDOSCOPY;  Service: Gastroenterology;  Laterality: N/A;   ESOPHAGOGASTRODUODENOSCOPY (EGD) WITH PROPOFOL N/A 12/06/2018   Procedure: ESOPHAGOGASTRODUODENOSCOPY (EGD) WITH PROPOFOL;  Surgeon: Lucilla Lame, MD;  Location: [facility] ENDOSCOPY;  Service: Endoscopy;  Laterality: N/A;   ESOPHAGOGASTRODUODENOSCOPY (EGD) WITH PROPOFOL N/A 03/19/2021   Procedure: ESOPHAGOGASTRODUODENOSCOPY (EGD) WITH  PROPOFOL;  Surgeon: Lin Landsman, MD;  Location: [facility] ENDOSCOPY;  Service: Gastroenterology;  Laterality: N/A;   ICD IMPLANT N/A 05/14/2021   Procedure: ICD IMPLANT;  Surgeon: [name], MD;  Location: [place] CV LAB;  Service: Cardiovascular;  Laterality: N/A;   IR CT HEAD LTD  01/30/2021   IR PERCUTANEOUS ART THROMBECTOMY/INFUSION INTRACRANIAL INC DIAG ANGIO  01/30/2021   LEFT HEART CATH AND CORONARY ANGIOGRAPHY N/A 01/28/2021   Procedure: LEFT HEART CATH AND CORONARY ANGIOGRAPHY;  Surgeon: Nelva Bush, MD;  Location: [place] CV LAB;  Service: Cardiovascular;  Laterality: N/A;   RADIOLOGY WITH ANESTHESIA N/A 01/30/2021   Procedure: IR WITH ANESTHESIA;  Surgeon: Luanne Bras, MD;  Location: [place];  Service: Radiology;  Laterality: N/A;   TONSILLECTOMY AND ADENOIDECTOMY     TRANSCAROTID ARTERY REVASCULARIZATION  Left 02/07/2021   Procedure: TRANSCAROTID ARTERY REVASCULARIZATION LEFT;  Surgeon: [name], MD;  Location: [place];  Service: Vascular;  Laterality: Left;    There were no vitals filed for this visit.   Subjective Assessment - 10/09/21 1626     Subjective pt pleasant, marked word finding difficulty, appears to be good historian    Currently in Pain? No/denies                SLP Evaluation [facility] - 10/09/21 1626       SLP Visit Information   SLP Received On 10/08/21    Referring Provider (SLP) Derinda Late    Onset Date 01/30/2021    Medical Diagnosis Left MCA      General Information   HPI Pt  is a 76 y.o. male with history of HTN, HLD, prior MI, CAD, AAA, CHD, ICM who was admitted for sustained Vtach requiring cardioversion 4/25. Pt had a code stroke called 4/28 due to aphasia and R sided facial droop noted by RN. 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 CTA Head noted an acute occlusion of the L MCA at M3. tPA given, pt s/p thrombectomy 4/28.  Pt also underwent placement of  Pacemaker as an Outpatient with some HHST services provided.    Behavioral/Cognition appropriate    Mobility Status ambulatory      Balance Screen   Has the patient fallen in the past 6 months No    Has the patient had a decrease in activity level because of a fear of falling?  No    Is the patient reluctant to leave their home because of a fear of falling?  No      Prior Functional Status   Cognitive/Linguistic Baseline Within functional limits    Type of Home House     Lives With Spouse    Available Support Family    Vocation Retired      Associate Professor   Overall Cognitive Status Within Functional Limits for tasks assessed      Auditory Comprehension   Overall Auditory Comprehension Appears within functional limits for tasks assessed    Yes/No Questions Within Functional Limits    Commands  Within Functional Limits    Conversation Moderately complex    Interfering Components Motor planning      Visual Recognition/Discrimination   Discrimination Within Function Limits      Reading Comprehension   Reading Status Within funtional limits      Expression   Primary Mode of Expression Verbal      Verbal Expression   Overall Verbal Expression Impaired    Initiation No impairment    Automatic Speech Name;Social Response    Level of Generative/Spontaneous Verbalization Phrase;Sentence    Repetition Impaired    Level of Impairment Word level    Naming Impairment    Responsive 76-100% accurate    Confrontation 75-100% accurate    Convergent 75-100% accurate    Divergent 50-74% accurate    Verbal Errors Semantic paraphasias;Phonemic paraphasias;Aware of errors;Inconsistent;Consistent    Pragmatics No impairment    Effective Techniques Phonemic cues;Sentence completion    Non-Verbal Means of Communication Not applicable      Written Expression   Dominant Hand Right    Written Expression Exceptions to [facility]    Self Formulation Ability Phrase      Oral Motor/Sensory Function    Overall Oral Motor/Sensory Function Appears within functional limits for tasks assessed      Motor Speech   Overall Motor Speech Impaired    Respiration Within functional limits    Phonation Normal    Resonance Within functional limits    Intelligibility Intelligibility reduced    Word 75-100% accurate    Phrase 75-100% accurate    Sentence 50-74% accurate    Conversation 50-74% accurate    Motor Planning Impaired    Level of Impairment Word    Motor Speech Errors Aware;Groping for words;Consistent;Inconsistent    Effective Techniques Slow rate    Phonation WFL      Standardized Assessments   Standardized Assessments  Western Aphasia Battery revised                             SLP Education - 10/09/21 1631     Education Details results of assessment, ST POC    Person(s) Educated Patient    Methods Explanation    Comprehension Verbalized understanding;Returned demonstration              SLP Short Term Goals - 10/09/21 1637       SLP SHORT TERM GOAL #1   Title Pt will produce sentences in response to a question (e.g. whats your favorite holiday and why) with appropriate articulation at 50% accuracy given frequent moderate phonemic placement cues.    Baseline moderate impairments    Time 10    Period --   sessions   Status New      SLP SHORT TERM GOAL #2   Title Pt will expressively produce 4+ word sentence in response to situation/picture with 50% accuracy given moderate cues in order to increase ability to communicate basic wants needs.    Baseline moderate impairments    Time 10    Period --   sessions   Status New      SLP SHORT TERM GOAL #3   Title Pt will write grammatically correct 5 word sentence about a basic picture with moderate assistance.    Baseline 3 word sentences    Time 10    Period --   sessions   Status New  SLP Long Term Goals - 10/09/21 1636       SLP LONG TERM GOAL #1   Title Pt will use  functional communication skills for social interaction (e.g., greetings,  social etiquette, and short questions/simple sentences) with both familiar and unfamiliar  partners with 90% success.    Baseline moderate impairment    Time 12    Period Weeks    Status New    Target Date 01/01/22              Plan - 10/09/21 1632     Clinical Impression Statement Pt presents with moderate non-fluent expressive aphasia, functional receptive language and reading abilities with mild impairments in writing. 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 Pt's expressive communication is marked by halting speech, word finding deficits, phonemic paraphasias, semantic paraphasias. When describing a picture, pt's sentence length is ~ 2-3 word. In addition, pt has phonemic dysfluencies with groping observed.   The Western Aphasia Battery Revised was administered to further quantify pt's communication abilities.   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      Speech Therapy Frequency 2x / week    Duration 12 weeks    Treatment/Interventions SLP instruction and feedback;Patient/family education;Language facilitation    Potential to Achieve Goals Good    Consulted and Agree with Plan of Care Patient             Patient will benefit from skilled therapeutic intervention in order to improve the following  deficits and impairments:   Aphasia  Apraxia  Cerebral thrombosis with cerebral infarction  Cerebral infarction due to embolism of left middle cerebral artery [facility])    Problem List Patient Active Problem List   Diagnosis Date Noted   Aortic aneurysm ([place]) 05/29/2021   ICD (implantable cardioverter-defibrillator) in place 05/14/2021   Ischemic cardiomyopathy 05/14/2021   NSVT (nonsustained ventricular tachycardia)    Melena 03/17/2021   Upper GI bleeding 03/16/2021   CKD (chronic kidney disease) stage 3, GFR 30-59 ml/min ([place]) 03/16/2021   Hypokalemia 03/16/2021   Carotid stenosis 02/07/2021   Cerebral thrombosis with cerebral infarction 01/30/2021   Cerebral embolism with cerebral infarction 01/30/2021   AKI (acute kidney injury) ([place])    Sustained VT (ventricular tachycardia) 01/27/2021   Peptic ulcer disease    Duodenal ulcer disease    GIB (gastrointestinal bleeding) 08/02/2018   Diabetes mellitus without complication ([place]) 02/63/7858   Erectile dysfunction 07/01/2015   Primary hypertension 08/09/2014   Pure hypercholesterolemia 85/11/7739   Chronic systolic heart failure ([place]) 08/04/2012   Coronary artery disease    Hyperlipidemia    AAA (abdominal aortic aneurysm)    NSTEMI (non-ST elevated myocardial infarction) ([place]) 07/09/2012   Hyperglycemia 07/08/2012   [place] B. Rutherford Nail M.S., CCC-SLP, [facility] Speech-Language Pathologist Rehabilitation Services Office [phone], [place] 10/09/2021, 4:53 PM  [place] MAIN [facility] SERVICES [address] [place], [place], [address] Phone: [phone]   Fax:  [phone]  Name: Garrett Hunter MRN: [id number] Date of Birth: 1945-11-16

## 2021-10-13 ENCOUNTER — Other Ambulatory Visit: Payer: Self-pay

## 2021-10-13 ENCOUNTER — Ambulatory Visit: Payer: Medicare Other | Admitting: Speech Pathology

## 2021-10-13 DIAGNOSIS — I6330 Cerebral infarction due to thrombosis of unspecified cerebral artery: Secondary | ICD-10-CM

## 2021-10-13 DIAGNOSIS — R4701 Aphasia: Secondary | ICD-10-CM

## 2021-10-13 DIAGNOSIS — I63412 Cerebral infarction due to embolism of left middle cerebral artery: Secondary | ICD-10-CM

## 2021-10-14 NOTE — Therapy (Signed)
[place] MAIN [facility] SERVICES [address] [place], [place], [address] Phone: [phone]   Fax:  [phone]  Speech Language Pathology Treatment  Patient Details  Name: Garrett Hunter MRN: [id number] Date of Birth: Oct 29, 1945 Referring Provider (SLP): Derinda Late   Encounter Date: 10/13/2021   End of Session - 10/14/21 1720     Visit Number 2    Number of Visits 25    Date for SLP Re-Evaluation 01/01/22    Authorization Type Medicare    Authorization Time Period 10/08/2021 thru 01/01/2022    Authorization - Visit Number 2    Progress Note Due on Visit 10    SLP Start Time 20    SLP Stop Time  1400    SLP Time Calculation (min) 60 min    Activity Tolerance Patient tolerated treatment well             Past Medical History:  Diagnosis Date   AAA (abdominal aortic aneurysm)    thoracoabdominal aortic aneursym, s/p right ilio-femoral bypass 10/12/16; 3V FEVAR with SMA, RRA, and LRA stenting 11/16/16 by Dr. Sammuel Hines [facility])   Benign neoplasm of colon    patient not sure   CHF (congestive heart failure) (HCC)    EF 45-50%   Chronic airway obstruction, not elsewhere classified    pt denies   Coronary artery disease    Inferior MI in 1996 treated with TPA. Cardiac cath in 2011 at [facility] showed chronic occlusion of the proximal RCA and proximal left circumflex without obstructive disease in the LAD. Non-ST elevation myocardial infarction in October 2013 while on vacation in [place]. Cardiac catheterization showed plaque rupture in the proximal ramus with thrombus. He had balloon angioplasty done. 01/28/21 cath    Coronary atherosclerosis of native coronary artery    Hepatitis A    teenager, no longer a problem   Hyperlipidemia    Intolerance to statins.   Hypertension    MI (myocardial infarction) ([place])    x 3 - last one 2013   Renal artery stenosis (HCC)    s/p bilateral renal artery angioplasty 07/26/18   Stroke ([place]) 01/2021    Ventricular tachycardia    s/p cardioverson 01/27/21    Past Surgical History:  Procedure Laterality Date   ABDOMINAL AORTIC ANEURYSM REPAIR     11/16/2016   ANGIOPLASTY     CARDIAC CATHETERIZATION  2011   CARDIAC CATHETERIZATION  2/14   [facility]; 95% lesion in the ramus intermedius.    CATARACT EXTRACTION W/PHACO Left 12/19/2018   Procedure: CATARACT EXTRACTION PHACO AND INTRAOCULAR LENS PLACEMENT ([place])  LEFT;  Surgeon: Eulogio Bear, MD;  Location: [place];  Service: Ophthalmology;  Laterality: Left;   CATARACT EXTRACTION W/PHACO Right 03/06/2019   Procedure: CATARACT EXTRACTION PHACO AND INTRAOCULAR LENS PLACEMENT (IOC)RIGHT;  Surgeon: Eulogio Bear, MD;  Location: [place];  Service: Ophthalmology;  Laterality: Right;   COLONOSCOPY WITH PROPOFOL N/A 03/20/2021   Procedure: COLONOSCOPY WITH PROPOFOL;  Surgeon: Lin Landsman, MD;  Location: [facility] ENDOSCOPY;  Service: Gastroenterology;  Laterality: N/A;   CORONARY ANGIOPLASTY  1996   [facility] x1   CORONARY ANGIOPLASTY  2/14   Severe restenosis in the ostial ramus. Status post angioplasty and drug-eluting stent placement with a 2.5 x 18 mm Xience drug-eluting stent   ESOPHAGOGASTRODUODENOSCOPY N/A 08/03/2018   Procedure: ESOPHAGOGASTRODUODENOSCOPY (EGD);  Surgeon: Lin Landsman, MD;  Location: [facility] ENDOSCOPY;  Service: Gastroenterology;  Laterality: N/A;   ESOPHAGOGASTRODUODENOSCOPY (EGD)  WITH PROPOFOL N/A 11/03/2018   Procedure: ESOPHAGOGASTRODUODENOSCOPY (EGD) WITH PROPOFOL;  Surgeon: Lin Landsman, MD;  Location: [facility] ENDOSCOPY;  Service: Gastroenterology;  Laterality: N/A;   ESOPHAGOGASTRODUODENOSCOPY (EGD) WITH PROPOFOL N/A 12/06/2018   Procedure: ESOPHAGOGASTRODUODENOSCOPY (EGD) WITH PROPOFOL;  Surgeon: Lucilla Lame, MD;  Location: [facility] ENDOSCOPY;  Service: Endoscopy;  Laterality: N/A;   ESOPHAGOGASTRODUODENOSCOPY (EGD) WITH PROPOFOL N/A 03/19/2021   Procedure: ESOPHAGOGASTRODUODENOSCOPY (EGD) WITH  PROPOFOL;  Surgeon: Lin Landsman, MD;  Location: [facility] ENDOSCOPY;  Service: Gastroenterology;  Laterality: N/A;   ICD IMPLANT N/A 05/14/2021   Procedure: ICD IMPLANT;  Surgeon: [name], MD;  Location: [place] CV LAB;  Service: Cardiovascular;  Laterality: N/A;   IR CT HEAD LTD  01/30/2021   IR PERCUTANEOUS ART THROMBECTOMY/INFUSION INTRACRANIAL INC DIAG ANGIO  01/30/2021   LEFT HEART CATH AND CORONARY ANGIOGRAPHY N/A 01/28/2021   Procedure: LEFT HEART CATH AND CORONARY ANGIOGRAPHY;  Surgeon: Nelva Bush, MD;  Location: [place] CV LAB;  Service: Cardiovascular;  Laterality: N/A;   RADIOLOGY WITH ANESTHESIA N/A 01/30/2021   Procedure: IR WITH ANESTHESIA;  Surgeon: Luanne Bras, MD;  Location: [place];  Service: Radiology;  Laterality: N/A;   TONSILLECTOMY AND ADENOIDECTOMY     TRANSCAROTID ARTERY REVASCULARIZATION  Left 02/07/2021   Procedure: TRANSCAROTID ARTERY REVASCULARIZATION LEFT;  Surgeon: [name], MD;  Location: [place];  Service: Vascular;  Laterality: Left;    There were no vitals filed for this visit.   Subjective Assessment - 10/14/21 1719     Subjective pt pleasant, motivated    Currently in Pain? No/denies                   ADULT SLP TREATMENT - 10/14/21 0001       Treatment Provided   Treatment provided Cognitive-Linquistic      Cognitive-Linquistic Treatment   Treatment focused on Aphasia;Patient/family/caregiver education    Skilled Treatment Skilled treatment session focused on pt's expressive language deficits and speech intelligibility.  creating a list of functional vocabulary words that pt uses within daily tasks. Using these vocabulary words (related to doing laundry), pt required max faded to moderate assistance to create 4-5 word sentences. SLP further facilitated session by providing moderate assistance to create grammatically correct sentence in response to presented story [facility] 1: Aphasia Rehab; Unit 5 Functional  Language; p 207. Pt benefited from visual printed representation for 5 out of 10 that were ungrammatical. When re-reading incorrect sentences, pt able to identify errors. Pt's speech intelligibility was targeted by using biographical information related to his children, grandchildren and great grands. Photos on pt's cell phone used to support names with pt further benefited from verbal model to improve pt's speech intelligibility from 75% to 80%.              SLP Education - 10/14/21 1720     Education Details aphasia    Person(s) Educated Patient    Methods Explanation;Demonstration    Comprehension Verbalized understanding;Returned demonstration              SLP Short Term Goals - 10/09/21 1637       SLP SHORT TERM GOAL #1   Title Pt will produce sentences in response to a question (e.g. whats your favorite holiday and why) with appropriate articulation at 50% accuracy given frequent moderate phonemic placement cues.    Baseline moderate impairments    Time 10    Period --   sessions   Status New  SLP SHORT TERM GOAL #2   Title Pt will expressively produce 4+ word sentence in response to situation/picture with 50% accuracy given moderate cues in order to increase ability to communicate basic wants needs.    Baseline moderate impairments    Time 10    Period --   sessions   Status New      SLP SHORT TERM GOAL #3   Title Pt will write grammatically correct 5 word sentence about a basic picture with moderate assistance.    Baseline 3 word sentences    Time 10    Period --   sessions   Status New              SLP Long Term Goals - 10/09/21 1636       SLP LONG TERM GOAL #1   Title Pt will use functional communication skills for social interaction (e.g., greetings,  social etiquette, and short questions/simple sentences) with both familiar and unfamiliar  partners with 90% success.    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 He responded well to today's therapy activities by increasing the number of words during structured responses but spontaneous responses continue to be halting, limited to 1-2 word phrases with decreased speech intelligibility d/t motor speech errors. Skilled ST intervention continues to be required to target pt's moderate aphasia in an effort to increase pt's functional independence for return to leisure, family and community activities.    Speech Therapy Frequency 2x / week    Duration 12 weeks    Treatment/Interventions SLP instruction and feedback;Patient/family education;Language facilitation    Potential to Achieve Goals Good    Consulted and Agree with Plan of Care Patient             Patient will benefit from skilled therapeutic intervention in order to improve the following deficits and impairments:   Aphasia  Cerebral thrombosis with cerebral infarction  Cerebral infarction due to embolism of left middle cerebral artery [facility])    Problem List Patient Active Problem List   Diagnosis Date Noted   Aortic aneurysm ([place]) 05/29/2021   ICD (implantable cardioverter-defibrillator) in place 05/14/2021   Ischemic cardiomyopathy 05/14/2021   NSVT (nonsustained ventricular tachycardia)    Melena 03/17/2021   Upper GI bleeding 03/16/2021   CKD (chronic kidney disease) stage 3, GFR 30-59 ml/min ([place]) 03/16/2021   Hypokalemia 03/16/2021   Carotid stenosis 02/07/2021   Cerebral thrombosis with cerebral infarction 01/30/2021   Cerebral embolism with cerebral infarction 01/30/2021   AKI (acute kidney injury) ([place])    Sustained VT (ventricular tachycardia) 01/27/2021   Peptic ulcer disease    Duodenal ulcer disease    GIB (gastrointestinal bleeding) 08/02/2018   Diabetes mellitus without complication  ([place]) 79/39/0300   Erectile dysfunction 07/01/2015   Primary hypertension 08/09/2014   Pure hypercholesterolemia 92/33/0076   Chronic systolic heart failure ([place]) 08/04/2012   Coronary artery disease    Hyperlipidemia    AAA (abdominal aortic aneurysm)    NSTEMI (non-ST elevated myocardial infarction) ([place]) 07/09/2012   Hyperglycemia 07/08/2012   [place] B. Rutherford Nail M.S., CCC-SLP, [place] [facility] [place], [place] 10/14/2021, 5:22 PM  [place]  [place] MAIN [facility] SERVICES [address] [place], [place], [address] Phone: [phone]   Fax:  [phone]   Name: Garrett Hunter MRN: [id number] Date of Birth: Jun 15, 1946

## 2021-10-15 ENCOUNTER — Other Ambulatory Visit: Payer: Self-pay

## 2021-10-15 ENCOUNTER — Ambulatory Visit: Payer: Medicare Other | Admitting: Speech Pathology

## 2021-10-15 DIAGNOSIS — I63412 Cerebral infarction due to embolism of left middle cerebral artery: Secondary | ICD-10-CM

## 2021-10-15 DIAGNOSIS — R4701 Aphasia: Secondary | ICD-10-CM | POA: Diagnosis not present

## 2021-10-15 DIAGNOSIS — R482 Apraxia: Secondary | ICD-10-CM

## 2021-10-15 NOTE — Therapy (Signed)
[place] MAIN [facility] SERVICES [address] [place], [place], [address] Phone: [phone]   Fax:  [phone]  Speech Language Pathology Treatment  Patient Details  Name: Garrett Hunter MRN: [id number] Date of Birth: 24-Jul-1946 Referring Provider (SLP): Derinda Late   Encounter Date: 10/15/2021   End of Session - 10/15/21 1651     Visit Number 3    Number of Visits 25    Date for SLP Re-Evaluation 01/01/22    Authorization Type Medicare    Authorization Time Period 10/08/2021 thru 01/01/2022    Authorization - Visit Number 3    Progress Note Due on Visit 10    SLP Start Time 22    SLP Stop Time  1400    SLP Time Calculation (min) 60 min    Activity Tolerance Patient tolerated treatment well             Past Medical History:  Diagnosis Date   AAA (abdominal aortic aneurysm)    thoracoabdominal aortic aneursym, s/p right ilio-femoral bypass 10/12/16; 3V FEVAR with SMA, RRA, and LRA stenting 11/16/16 by Dr. Sammuel Hines [facility])   Benign neoplasm of colon    patient not sure   CHF (congestive heart failure) (HCC)    EF 45-50%   Chronic airway obstruction, not elsewhere classified    pt denies   Coronary artery disease    Inferior MI in 1996 treated with TPA. Cardiac cath in 2011 at [facility] showed chronic occlusion of the proximal RCA and proximal left circumflex without obstructive disease in the LAD. Non-ST elevation myocardial infarction in October 2013 while on vacation in [place]. Cardiac catheterization showed plaque rupture in the proximal ramus with thrombus. He had balloon angioplasty done. 01/28/21 cath    Coronary atherosclerosis of native coronary artery    Hepatitis A    teenager, no longer a problem   Hyperlipidemia    Intolerance to statins.   Hypertension    MI (myocardial infarction) ([place])    x 3 - last one 2013   Renal artery stenosis (HCC)    s/p bilateral renal artery angioplasty 07/26/18   Stroke ([place]) 01/2021    Ventricular tachycardia    s/p cardioverson 01/27/21    Past Surgical History:  Procedure Laterality Date   ABDOMINAL AORTIC ANEURYSM REPAIR     11/16/2016   ANGIOPLASTY     CARDIAC CATHETERIZATION  2011   CARDIAC CATHETERIZATION  2/14   [facility]; 95% lesion in the ramus intermedius.    CATARACT EXTRACTION W/PHACO Left 12/19/2018   Procedure: CATARACT EXTRACTION PHACO AND INTRAOCULAR LENS PLACEMENT ([place])  LEFT;  Surgeon: Eulogio Bear, MD;  Location: [place];  Service: Ophthalmology;  Laterality: Left;   CATARACT EXTRACTION W/PHACO Right 03/06/2019   Procedure: CATARACT EXTRACTION PHACO AND INTRAOCULAR LENS PLACEMENT (IOC)RIGHT;  Surgeon: Eulogio Bear, MD;  Location: [place];  Service: Ophthalmology;  Laterality: Right;   COLONOSCOPY WITH PROPOFOL N/A 03/20/2021   Procedure: COLONOSCOPY WITH PROPOFOL;  Surgeon: Lin Landsman, MD;  Location: [facility] ENDOSCOPY;  Service: Gastroenterology;  Laterality: N/A;   CORONARY ANGIOPLASTY  1996   [facility] x1   CORONARY ANGIOPLASTY  2/14   Severe restenosis in the ostial ramus. Status post angioplasty and drug-eluting stent placement with a 2.5 x 18 mm Xience drug-eluting stent   ESOPHAGOGASTRODUODENOSCOPY N/A 08/03/2018   Procedure: ESOPHAGOGASTRODUODENOSCOPY (EGD);  Surgeon: Lin Landsman, MD;  Location: [facility] ENDOSCOPY;  Service: Gastroenterology;  Laterality: N/A;   ESOPHAGOGASTRODUODENOSCOPY (EGD)  WITH PROPOFOL N/A 11/03/2018   Procedure: ESOPHAGOGASTRODUODENOSCOPY (EGD) WITH PROPOFOL;  Surgeon: Lin Landsman, MD;  Location: [facility] ENDOSCOPY;  Service: Gastroenterology;  Laterality: N/A;   ESOPHAGOGASTRODUODENOSCOPY (EGD) WITH PROPOFOL N/A 12/06/2018   Procedure: ESOPHAGOGASTRODUODENOSCOPY (EGD) WITH PROPOFOL;  Surgeon: Lucilla Lame, MD;  Location: [facility] ENDOSCOPY;  Service: Endoscopy;  Laterality: N/A;   ESOPHAGOGASTRODUODENOSCOPY (EGD) WITH PROPOFOL N/A 03/19/2021   Procedure: ESOPHAGOGASTRODUODENOSCOPY (EGD) WITH  PROPOFOL;  Surgeon: Lin Landsman, MD;  Location: [facility] ENDOSCOPY;  Service: Gastroenterology;  Laterality: N/A;   ICD IMPLANT N/A 05/14/2021   Procedure: ICD IMPLANT;  Surgeon: [name], MD;  Location: [place] CV LAB;  Service: Cardiovascular;  Laterality: N/A;   IR CT HEAD LTD  01/30/2021   IR PERCUTANEOUS ART THROMBECTOMY/INFUSION INTRACRANIAL INC DIAG ANGIO  01/30/2021   LEFT HEART CATH AND CORONARY ANGIOGRAPHY N/A 01/28/2021   Procedure: LEFT HEART CATH AND CORONARY ANGIOGRAPHY;  Surgeon: Nelva Bush, MD;  Location: [place] CV LAB;  Service: Cardiovascular;  Laterality: N/A;   RADIOLOGY WITH ANESTHESIA N/A 01/30/2021   Procedure: IR WITH ANESTHESIA;  Surgeon: Luanne Bras, MD;  Location: [place];  Service: Radiology;  Laterality: N/A;   TONSILLECTOMY AND ADENOIDECTOMY     TRANSCAROTID ARTERY REVASCULARIZATION  Left 02/07/2021   Procedure: TRANSCAROTID ARTERY REVASCULARIZATION LEFT;  Surgeon: [name], MD;  Location: [place];  Service: Vascular;  Laterality: Left;    There were no vitals filed for this visit.   Subjective Assessment - 10/15/21 1649     Subjective pt pleasant, motivated, brought his iPad with him as instructed    Currently in Pain? No/denies                   ADULT SLP TREATMENT - 10/15/21 0001       Treatment Provided   Treatment provided Cognitive-Linquistic      Cognitive-Linquistic Treatment   Treatment focused on Aphasia;Patient/family/caregiver education    Skilled Treatment [employer] Therapy app on iPad used to target multiple skill areas: COMPLETE THE PHRASE - level 2 - 70%; improving to 100% with moderate cues to slow response rate; PHRASE REPETITION - level 1 - 70% improving to 80% with continued cues to slow rate of speech; SENTENCE SCRAMBLE - level 4 - 70%; FLASHCARD NAMING - level 1 - 50% improving to 100% with use of definition and sentence completion              SLP Education - 10/15/21 1650      Education Details slowing rate of speech and slowing rate of completing therapy activities    Person(s) Educated Patient    Methods Explanation;Demonstration;Verbal cues;Tactile cues    Comprehension Verbalized understanding;Need further instruction              SLP Short Term Goals - 10/09/21 1637       SLP SHORT TERM GOAL #1   Title Pt will produce sentences in response to a question (e.g. whats your favorite holiday and why) with appropriate articulation at 50% accuracy given frequent moderate phonemic placement cues.    Baseline moderate impairments    Time 10    Period --   sessions   Status New      SLP SHORT TERM GOAL #2   Title Pt will expressively produce 4+ word sentence in response to situation/picture with 50% accuracy given moderate cues in order to increase ability to communicate basic wants needs.    Baseline moderate impairments    Time 10  Period --   sessions   Status New      SLP SHORT TERM GOAL #3   Title Pt will write grammatically correct 5 word sentence about a basic picture with moderate assistance.    Baseline 3 word sentences    Time 10    Period --   sessions   Status New              SLP Long Term Goals - 10/09/21 1636       SLP LONG TERM GOAL #1   Title Pt will use functional communication skills for social interaction (e.g., greetings,  social etiquette, and short questions/simple sentences) with both familiar and unfamiliar  partners with 90% success.    Baseline moderate impairment    Time 12    Period Weeks    Status New    Target Date 01/01/22              Plan - 10/15/21 1651     Clinical Impression Statement Pt is eager to engage in therapy tasks/activities but requires continued cues to slow rate of completion as well as rate of speech to improve accuracy and speech intelligibility. Pt able to navigate TalkPath therapy app with minimal assistance for further activities targeting language expression.  Skilled ST  intervention continues to be required to target pt's moderate aphasia in an effort to increase pt's functional independence for return to leisure, family and community activities.    Speech Therapy Frequency 2x / week    Duration 12 weeks    Treatment/Interventions SLP instruction and feedback;Patient/family education;Language facilitation    Potential to Achieve Goals Good    SLP Home Exercise Plan provided, see pt instructions section    Consulted and Agree with Plan of Care Patient             Patient will benefit from skilled therapeutic intervention in order to improve the following deficits and impairments:   Aphasia  Apraxia  Cerebral infarction due to embolism of left middle cerebral artery [facility])    Problem List Patient Active Problem List   Diagnosis Date Noted   Aortic aneurysm ([place]) 05/29/2021   ICD (implantable cardioverter-defibrillator) in place 05/14/2021   Ischemic cardiomyopathy 05/14/2021   NSVT (nonsustained ventricular tachycardia)    Melena 03/17/2021   Upper GI bleeding 03/16/2021   CKD (chronic kidney disease) stage 3, GFR 30-59 ml/min (HCC) 03/16/2021   Hypokalemia 03/16/2021   Carotid stenosis 02/07/2021   Cerebral thrombosis with cerebral infarction 01/30/2021   Cerebral embolism with cerebral infarction 01/30/2021   AKI (acute kidney injury) ([place])    Sustained VT (ventricular tachycardia) 01/27/2021   Peptic ulcer disease    Duodenal ulcer disease    GIB (gastrointestinal bleeding) 08/02/2018   Diabetes mellitus without complication ([place]) 62/95/2841   Erectile dysfunction 07/01/2015   Primary hypertension 08/09/2014   Pure hypercholesterolemia 32/44/0102   Chronic systolic heart failure ([place]) 08/04/2012   Coronary artery disease    Hyperlipidemia    AAA (abdominal aortic aneurysm)    NSTEMI (non-ST elevated myocardial infarction) ([place]) 07/09/2012   Hyperglycemia 07/08/2012   Charlott Calvario B. Rutherford Nail M.S., CCC-SLP, [place]  Pathologist Rehabilitation Services Office [phone]  Stormy Fabian, [place] 10/15/2021, 4:52 PM  [place] MAIN [facility] SERVICES [address] [place], [place], [address] Phone: [phone]   Fax:  [phone]   Name: DOMNICK CHERVENAK MRN: [id number] Date of Birth: 1946-07-26

## 2021-10-15 NOTE — Patient Instructions (Signed)
Complete selected activities on TalkPath therapy app

## 2021-10-20 ENCOUNTER — Encounter: Payer: Medicare Other | Admitting: Speech Pathology

## 2021-10-22 ENCOUNTER — Ambulatory Visit: Payer: Medicare Other | Admitting: Speech Pathology

## 2021-10-22 ENCOUNTER — Other Ambulatory Visit: Payer: Self-pay

## 2021-10-22 ENCOUNTER — Encounter: Payer: Medicare Other | Admitting: Cardiology

## 2021-10-22 DIAGNOSIS — R4701 Aphasia: Secondary | ICD-10-CM | POA: Diagnosis not present

## 2021-10-22 DIAGNOSIS — R482 Apraxia: Secondary | ICD-10-CM

## 2021-10-22 DIAGNOSIS — I63412 Cerebral infarction due to embolism of left middle cerebral artery: Secondary | ICD-10-CM

## 2021-10-23 NOTE — Patient Instructions (Signed)
Complete worksheets targeting synonyms TalkPath therapy apps

## 2021-10-23 NOTE — Therapy (Signed)
[place] MAIN [facility] SERVICES [address] [place], [place], [address] Phone: [phone]   Fax:  [phone]  Speech Language Pathology Treatment  Patient Details  Name: Garrett Hunter MRN: [id number] Date of Birth: 06-May-1946 Referring Provider (SLP): Derinda Late   Encounter Date: 10/22/2021   End of Session - 10/23/21 0839     Visit Number 4    Number of Visits 25    Date for SLP Re-Evaluation 01/01/22    Authorization Type Medicare    Authorization Time Period 10/08/2021 thru 01/01/2022    Authorization - Visit Number 4    Progress Note Due on Visit 10    SLP Start Time 27    SLP Stop Time  1400    SLP Time Calculation (min) 60 min    Activity Tolerance Patient tolerated treatment well             Past Medical History:  Diagnosis Date   AAA (abdominal aortic aneurysm)    thoracoabdominal aortic aneursym, s/p right ilio-femoral bypass 10/12/16; 3V FEVAR with SMA, RRA, and LRA stenting 11/16/16 by Dr. Sammuel Hines [facility])   Benign neoplasm of colon    patient not sure   CHF (congestive heart failure) (HCC)    EF 45-50%   Chronic airway obstruction, not elsewhere classified    pt denies   Coronary artery disease    Inferior MI in 1996 treated with TPA. Cardiac cath in 2011 at [facility] showed chronic occlusion of the proximal RCA and proximal left circumflex without obstructive disease in the LAD. Non-ST elevation myocardial infarction in October 2013 while on vacation in [place]. Cardiac catheterization showed plaque rupture in the proximal ramus with thrombus. He had balloon angioplasty done. 01/28/21 cath    Coronary atherosclerosis of native coronary artery    Hepatitis A    teenager, no longer a problem   Hyperlipidemia    Intolerance to statins.   Hypertension    MI (myocardial infarction) ([place])    x 3 - last one 2013   Renal artery stenosis (HCC)    s/p bilateral renal artery angioplasty 07/26/18   Stroke ([place]) 01/2021    Ventricular tachycardia    s/p cardioverson 01/27/21    Past Surgical History:  Procedure Laterality Date   ABDOMINAL AORTIC ANEURYSM REPAIR     11/16/2016   ANGIOPLASTY     CARDIAC CATHETERIZATION  2011   CARDIAC CATHETERIZATION  2/14   [facility]; 95% lesion in the ramus intermedius.    CATARACT EXTRACTION W/PHACO Left 12/19/2018   Procedure: CATARACT EXTRACTION PHACO AND INTRAOCULAR LENS PLACEMENT ([place])  LEFT;  Surgeon: Eulogio Bear, MD;  Location: [place];  Service: Ophthalmology;  Laterality: Left;   CATARACT EXTRACTION W/PHACO Right 03/06/2019   Procedure: CATARACT EXTRACTION PHACO AND INTRAOCULAR LENS PLACEMENT (IOC)RIGHT;  Surgeon: Eulogio Bear, MD;  Location: [place];  Service: Ophthalmology;  Laterality: Right;   COLONOSCOPY WITH PROPOFOL N/A 03/20/2021   Procedure: COLONOSCOPY WITH PROPOFOL;  Surgeon: Lin Landsman, MD;  Location: [facility] ENDOSCOPY;  Service: Gastroenterology;  Laterality: N/A;   CORONARY ANGIOPLASTY  1996   [facility] x1   CORONARY ANGIOPLASTY  2/14   Severe restenosis in the ostial ramus. Status post angioplasty and drug-eluting stent placement with a 2.5 x 18 mm Xience drug-eluting stent   ESOPHAGOGASTRODUODENOSCOPY N/A 08/03/2018   Procedure: ESOPHAGOGASTRODUODENOSCOPY (EGD);  Surgeon: Lin Landsman, MD;  Location: [facility] ENDOSCOPY;  Service: Gastroenterology;  Laterality: N/A;   ESOPHAGOGASTRODUODENOSCOPY (EGD)  WITH PROPOFOL N/A 11/03/2018   Procedure: ESOPHAGOGASTRODUODENOSCOPY (EGD) WITH PROPOFOL;  Surgeon: Lin Landsman, MD;  Location: [facility] ENDOSCOPY;  Service: Gastroenterology;  Laterality: N/A;   ESOPHAGOGASTRODUODENOSCOPY (EGD) WITH PROPOFOL N/A 12/06/2018   Procedure: ESOPHAGOGASTRODUODENOSCOPY (EGD) WITH PROPOFOL;  Surgeon: Lucilla Lame, MD;  Location: [facility] ENDOSCOPY;  Service: Endoscopy;  Laterality: N/A;   ESOPHAGOGASTRODUODENOSCOPY (EGD) WITH PROPOFOL N/A 03/19/2021   Procedure: ESOPHAGOGASTRODUODENOSCOPY (EGD) WITH  PROPOFOL;  Surgeon: Lin Landsman, MD;  Location: [facility] ENDOSCOPY;  Service: Gastroenterology;  Laterality: N/A;   ICD IMPLANT N/A 05/14/2021   Procedure: ICD IMPLANT;  Surgeon: [name], MD;  Location: [place] CV LAB;  Service: Cardiovascular;  Laterality: N/A;   IR CT HEAD LTD  01/30/2021   IR PERCUTANEOUS ART THROMBECTOMY/INFUSION INTRACRANIAL INC DIAG ANGIO  01/30/2021   LEFT HEART CATH AND CORONARY ANGIOGRAPHY N/A 01/28/2021   Procedure: LEFT HEART CATH AND CORONARY ANGIOGRAPHY;  Surgeon: Nelva Bush, MD;  Location: [place] CV LAB;  Service: Cardiovascular;  Laterality: N/A;   RADIOLOGY WITH ANESTHESIA N/A 01/30/2021   Procedure: IR WITH ANESTHESIA;  Surgeon: Luanne Bras, MD;  Location: [place];  Service: Radiology;  Laterality: N/A;   TONSILLECTOMY AND ADENOIDECTOMY     TRANSCAROTID ARTERY REVASCULARIZATION  Left 02/07/2021   Procedure: TRANSCAROTID ARTERY REVASCULARIZATION LEFT;  Surgeon: [name], MD;  Location: [place];  Service: Vascular;  Laterality: Left;    There were no vitals filed for this visit.   Subjective Assessment - 10/23/21 0826     Subjective "boring" referring to activities on the [place] therapy app    Currently in Pain? No/denies                   ADULT SLP TREATMENT - 10/23/21 0001       Treatment Provided   Treatment provided Cognitive-Linquistic      Cognitive-Linquistic Treatment   Treatment focused on Aphasia;Patient/family/caregiver education    Skilled Treatment TalkPath Therapy app on iPad used to target multiple skill areas: READING  [place] - LEVEL 2: 100% advanced to level 3  WRITING  COMPLETE THE PHRASE - level 2 - 80%; improving to 100% with moderate cues to slow response rate;    SENTENCE SCRAMBLE - level 4 - 70%; changed task to writing sentence on paper d/t pt frustration with selecting on iPad screen  WRITTEN SENTENCES: pt able to correctly unscramble sentences but required maximal  assistance for correct spelling and use of suffixes  VOCABULARY BUILDING: pt required maximal faded to moderate assistance to achieve 100% accuracy with synonym activity ([facility] 1: Aphasia Rehab, Unit 3 - Vocabulary pp 70-73) - providing a model sentence helpful and moderate assistance to provide appropriate word based on definition [facility] 1: Aphasia Rehab - Vocabulary p82)  APRAXIA OF SPEECH: 100% monosyllabic words; 25% with bi-syllabic words              SLP Education - 10/23/21 0839     Education Details apraxia of speech    Person(s) Educated Patient    Methods Explanation;Demonstration;Verbal cues;Handout    Comprehension Verbalized understanding;Need further instruction              SLP Short Term Goals - 10/09/21 1637       SLP SHORT TERM GOAL #1   Title Pt will produce sentences in response to a question (e.g. whats your favorite holiday and why) with appropriate articulation at 50% accuracy given frequent moderate phonemic placement cues.    Baseline  moderate impairments    Time 10    Period --   sessions   Status New      SLP SHORT TERM GOAL #2   Title Pt will expressively produce 4+ word sentence in response to situation/picture with 50% accuracy given moderate cues in order to increase ability to communicate basic wants needs.    Baseline moderate impairments    Time 10    Period --   sessions   Status New      SLP SHORT TERM GOAL #3   Title Pt will write grammatically correct 5 word sentence about a basic picture with moderate assistance.    Baseline 3 word sentences    Time 10    Period --   sessions   Status New              SLP Long Term Goals - 10/09/21 1636       SLP LONG TERM GOAL #1   Title Pt will use functional communication skills for social interaction (e.g., greetings,  social etiquette, and short questions/simple sentences) with both familiar and unfamiliar  partners with 90% success.    Baseline moderate impairment    Time 12     Period Weeks    Status New    Target Date 01/01/22              Plan - 10/23/21 0981     Clinical Impression Statement Pt is eager to engage in therapy tasks/activities but requires continued cues to slow rate of completion as well as rate of speech to improve accuracy and speech intelligibility.  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    Speech Therapy Frequency 2x / week    Duration 12 weeks    Treatment/Interventions SLP instruction and feedback;Patient/family education;Language facilitation    Potential to Achieve Goals Good    SLP Home Exercise Plan provided, see pt instructions section    Consulted and Agree with Plan of Care Patient             Patient will benefit from skilled therapeutic intervention in order to improve the following deficits and impairments:   Aphasia  Apraxia  Cerebral infarction due to embolism of left middle cerebral artery [facility])    Problem List Patient Active Problem List   Diagnosis Date Noted   Aortic aneurysm ([place]) 05/29/2021   ICD (implantable cardioverter-defibrillator) in place 05/14/2021   Ischemic cardiomyopathy 05/14/2021   NSVT (nonsustained ventricular tachycardia)    Melena 03/17/2021   Upper GI bleeding 03/16/2021   CKD (chronic kidney disease) stage 3, GFR 30-59 ml/min (HCC) 03/16/2021   Hypokalemia 03/16/2021   Carotid stenosis 02/07/2021   Cerebral thrombosis with cerebral infarction 01/30/2021   Cerebral embolism with cerebral infarction 01/30/2021   AKI (acute kidney injury) ([place])    Sustained VT (ventricular tachycardia) 01/27/2021   Peptic ulcer disease    Duodenal ulcer disease    GIB (gastrointestinal bleeding) 08/02/2018   Diabetes mellitus without complication ([place]) 19/14/7829   Erectile dysfunction 07/01/2015   Primary hypertension  08/09/2014   Pure hypercholesterolemia 56/21/3086   Chronic systolic heart failure ([place]) 08/04/2012   Coronary artery disease    Hyperlipidemia    AAA (abdominal aortic aneurysm)    NSTEMI (non-ST elevated myocardial infarction) ([place]) 07/09/2012   Hyperglycemia 07/08/2012   Dymon [place] B. Rutherford Nail, M.S.,  CCC-SLP, [facility] [phone]  Stormy Fabian, [place] 10/23/2021, 8:40 AM  [place] MAIN [facility] SERVICES [address] [place], [place], [address] Phone: [phone]   Fax:  [phone]   Name: Garrett Hunter MRN: [id number] Date of Birth: 03-18-1946

## 2021-10-27 ENCOUNTER — Other Ambulatory Visit: Payer: Self-pay

## 2021-10-27 ENCOUNTER — Ambulatory Visit: Payer: Medicare Other | Admitting: Speech Pathology

## 2021-10-27 DIAGNOSIS — R4701 Aphasia: Secondary | ICD-10-CM | POA: Diagnosis not present

## 2021-10-27 DIAGNOSIS — I63412 Cerebral infarction due to embolism of left middle cerebral artery: Secondary | ICD-10-CM

## 2021-10-27 DIAGNOSIS — R482 Apraxia: Secondary | ICD-10-CM

## 2021-10-28 NOTE — Therapy (Signed)
[place] MAIN [facility] SERVICES [address] [place], [place], [address] Phone: [phone]   Fax:  [phone]  Speech Language Pathology Treatment  Patient Details  Name: Garrett Hunter MRN: [id number] Date of Birth: [birth year] Referring Provider (SLP): Derinda Late   Encounter Date: 10/27/2021   End of Session - 10/28/21 2542     Visit Number 5    Number of Visits 25    Date for SLP Re-Evaluation 01/01/22    Authorization Type Medicare    Authorization Time Period 10/08/2021 thru 01/01/2022    Authorization - Visit Number 5    Progress Note Due on Visit 10    SLP Start Time 65    SLP Stop Time  1400    SLP Time Calculation (min) 60 min    Activity Tolerance Patient tolerated treatment well             Past Medical History:  Diagnosis Date   AAA (abdominal aortic aneurysm)    thoracoabdominal aortic aneursym, s/p right ilio-femoral bypass 10/12/16; 3V FEVAR with SMA, RRA, and LRA stenting 11/16/16 by Dr. Sammuel Hines [facility])   Benign neoplasm of colon    patient not sure   CHF (congestive heart failure) (HCC)    EF 45-50%   Chronic airway obstruction, not elsewhere classified    pt denies   Coronary artery disease    Inferior MI in 1996 treated with TPA. Cardiac cath in 2011 at [facility] showed chronic occlusion of the proximal RCA and proximal left circumflex without obstructive disease in the LAD. Non-ST elevation myocardial infarction in October 2013 while on vacation in [place]. Cardiac catheterization showed plaque rupture in the proximal ramus with thrombus. He had balloon angioplasty done. 01/28/21 cath    Coronary atherosclerosis of native coronary artery    Hepatitis A    teenager, no longer a problem   Hyperlipidemia    Intolerance to statins.   Hypertension    MI (myocardial infarction) ([place])    x 3 - last one 2013   Renal artery stenosis (HCC)    s/p bilateral renal artery angioplasty 07/26/18   Stroke ([place]) 01/2021    Ventricular tachycardia    s/p cardioverson 01/27/21    Past Surgical History:  Procedure Laterality Date   ABDOMINAL AORTIC ANEURYSM REPAIR     11/16/2016   ANGIOPLASTY     CARDIAC CATHETERIZATION  2011   CARDIAC CATHETERIZATION  2/14   [facility]; 95% lesion in the ramus intermedius.    CATARACT EXTRACTION W/PHACO Left 12/19/2018   Procedure: CATARACT EXTRACTION PHACO AND INTRAOCULAR LENS PLACEMENT ([place])  LEFT;  Surgeon: Eulogio Bear, MD;  Location: [place];  Service: Ophthalmology;  Laterality: Left;   CATARACT EXTRACTION W/PHACO Right 03/06/2019   Procedure: CATARACT EXTRACTION PHACO AND INTRAOCULAR LENS PLACEMENT (IOC)RIGHT;  Surgeon: Eulogio Bear, MD;  Location: [place];  Service: Ophthalmology;  Laterality: Right;   COLONOSCOPY WITH PROPOFOL N/A 03/20/2021   Procedure: COLONOSCOPY WITH PROPOFOL;  Surgeon: Lin Landsman, MD;  Location: [facility] ENDOSCOPY;  Service: Gastroenterology;  Laterality: N/A;   CORONARY ANGIOPLASTY  1996   [facility] x1   CORONARY ANGIOPLASTY  2/14   Severe restenosis in the ostial ramus. Status post angioplasty and drug-eluting stent placement with a 2.5 x 18 mm Xience drug-eluting stent   ESOPHAGOGASTRODUODENOSCOPY N/A 08/03/2018   Procedure: ESOPHAGOGASTRODUODENOSCOPY (EGD);  Surgeon: Lin Landsman, MD;  Location: [facility] ENDOSCOPY;  Service: Gastroenterology;  Laterality: N/A;   ESOPHAGOGASTRODUODENOSCOPY (EGD)  WITH PROPOFOL N/A 11/03/2018   Procedure: ESOPHAGOGASTRODUODENOSCOPY (EGD) WITH PROPOFOL;  Surgeon: Lin Landsman, MD;  Location: [facility] ENDOSCOPY;  Service: Gastroenterology;  Laterality: N/A;   ESOPHAGOGASTRODUODENOSCOPY (EGD) WITH PROPOFOL N/A 12/06/2018   Procedure: ESOPHAGOGASTRODUODENOSCOPY (EGD) WITH PROPOFOL;  Surgeon: Lucilla Lame, MD;  Location: [facility] ENDOSCOPY;  Service: Endoscopy;  Laterality: N/A;   ESOPHAGOGASTRODUODENOSCOPY (EGD) WITH PROPOFOL N/A 03/19/2021   Procedure: ESOPHAGOGASTRODUODENOSCOPY (EGD) WITH  PROPOFOL;  Surgeon: Lin Landsman, MD;  Location: [facility] ENDOSCOPY;  Service: Gastroenterology;  Laterality: N/A;   ICD IMPLANT N/A 05/14/2021   Procedure: ICD IMPLANT;  Surgeon: [name], MD;  Location: [place] CV LAB;  Service: Cardiovascular;  Laterality: N/A;   IR CT HEAD LTD  01/30/2021   IR PERCUTANEOUS ART THROMBECTOMY/INFUSION INTRACRANIAL INC DIAG ANGIO  01/30/2021   LEFT HEART CATH AND CORONARY ANGIOGRAPHY N/A 01/28/2021   Procedure: LEFT HEART CATH AND CORONARY ANGIOGRAPHY;  Surgeon: Nelva Bush, MD;  Location: [place] CV LAB;  Service: Cardiovascular;  Laterality: N/A;   RADIOLOGY WITH ANESTHESIA N/A 01/30/2021   Procedure: IR WITH ANESTHESIA;  Surgeon: Luanne Bras, MD;  Location: [place];  Service: Radiology;  Laterality: N/A;   TONSILLECTOMY AND ADENOIDECTOMY     TRANSCAROTID ARTERY REVASCULARIZATION  Left 02/07/2021   Procedure: TRANSCAROTID ARTERY REVASCULARIZATION LEFT;  Surgeon: [name], MD;  Location: [place];  Service: Vascular;  Laterality: Left;    There were no vitals filed for this visit.   Subjective Assessment - 10/28/21 0650     Subjective "It took me a while, I had to use [employer] to help me with spelling" referring to vocabulary building homework    Currently in Pain? No/denies                   ADULT SLP TREATMENT - 10/28/21 0001       Treatment Provided   Treatment provided Cognitive-Linquistic      Cognitive-Linquistic Treatment   Treatment focused on Aphasia;Patient/family/caregiver education    Skilled Treatment SENTENCE SCRAMBLE - 100% accuracy when given 5 written words, pt write sentence placing words in correct order with appropriate spelling - much improved    WRITTEN SENTENCES: Given a familiar prompt, pt able to write a 2 sentence response with moderate faded to minimal cues for word finding and grammar - much improved - pt reports effort of 7 out 10     APRAXIA OF SPEECH: maximal cues to preplan,  produce initial phoneme of difficult words, after receiving maximal cues, pt was able to produce with greater speech accuracy when written the sentences that he wrote in the above tasks    CONVERSATIONAL SPEECH AND LANGUAGE: continues to be 2-3 word utterances with heavy cues to slow rate of speech              SLP Education - 10/28/21 0652     Education Details word finding deficits, spelling, grammar, speech intelligibility strategies    Person(s) Educated Patient    Methods Explanation;Demonstration;Verbal cues;Handout    Comprehension Verbalized understanding;Returned demonstration;Need further instruction              SLP Short Term Goals - 10/09/21 1637       SLP SHORT TERM GOAL #1   Title Pt will produce sentences in response to a question (e.g. whats your favorite holiday and why) with appropriate articulation at 50% accuracy given frequent moderate phonemic placement cues.    Baseline moderate impairments    Time 10  Period --   sessions   Status New      SLP SHORT TERM GOAL #2   Title Pt will expressively produce 4+ word sentence in response to situation/picture with 50% accuracy given moderate cues in order to increase ability to communicate basic wants needs.    Baseline moderate impairments    Time 10    Period --   sessions   Status New      SLP SHORT TERM GOAL #3   Title Pt will write grammatically correct 5 word sentence about a basic picture with moderate assistance.    Baseline 3 word sentences    Time 10    Period --   sessions   Status New              SLP Long Term Goals - 10/09/21 1636       SLP LONG TERM GOAL #1   Title Pt will use functional communication skills for social interaction (e.g., greetings,  social etiquette, and short questions/simple sentences) with both familiar and unfamiliar  partners with 90% success.    Baseline moderate impairment    Time 12    Period Weeks    Status New    Target Date 01/01/22               Plan - 10/28/21 9937     Clinical Impression Statement Pt is eager to engage in therapy tasks/activities with improvement noted in grammar and sentence creation within structured tasks. His expressive language abilities continue to be 2-3 words in length with moderate apraxia of speech.  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    Speech Therapy Frequency 2x / week    Duration 12 weeks    Treatment/Interventions SLP instruction and feedback;Patient/family education;Language facilitation    Potential to Achieve Goals Good    SLP Home Exercise Plan provided, see pt instructions section    Consulted and Agree with Plan of Care Patient             Patient will benefit from skilled therapeutic intervention in order to improve the following deficits and impairments:   Aphasia  Apraxia  Cerebral infarction due to embolism of left middle cerebral artery [facility])    Problem List Patient Active Problem List   Diagnosis Date Noted   Aortic aneurysm ([place]) 05/29/2021   ICD (implantable cardioverter-defibrillator) in place 05/14/2021   Ischemic cardiomyopathy 05/14/2021   NSVT (nonsustained ventricular tachycardia)    Melena 03/17/2021   Upper GI bleeding 03/16/2021   CKD (chronic kidney disease) stage 3, GFR 30-59 ml/min (HCC) 03/16/2021   Hypokalemia 03/16/2021   Carotid stenosis 02/07/2021   Cerebral thrombosis with cerebral infarction 01/30/2021   Cerebral embolism with cerebral infarction 01/30/2021   AKI (acute kidney injury) ([place])    Sustained VT (ventricular tachycardia) 01/27/2021   Peptic ulcer disease    Duodenal ulcer disease    GIB (gastrointestinal bleeding) 08/02/2018   Diabetes mellitus without complication ([place]) 16/96/7893   Erectile dysfunction 07/01/2015   Primary hypertension 08/09/2014    Pure hypercholesterolemia 81/10/7508   Chronic systolic heart failure ([place]) 08/04/2012   Coronary artery disease    Hyperlipidemia    AAA (abdominal aortic aneurysm)    NSTEMI (non-ST elevated myocardial infarction) ([place]) 07/09/2012   Hyperglycemia 07/08/2012   Evangela Heffler B. Rutherford Nail, M.S., CCC-SLP, [place] Speech-Language Pathologist  Rehabilitation Services Office [phone], Layne Benton 10/28/2021, 6:55 AM  [place] MAIN [facility] SERVICES [address] [place], [place], [address] Phone: [phone]   Fax:  [phone]   Name: Garrett Hunter MRN: [id number] Date of Birth: 03-Sep-1946

## 2021-10-28 NOTE — Patient Instructions (Signed)
Complete TalkPath therapy app subsections

## 2021-10-29 ENCOUNTER — Ambulatory Visit: Payer: Medicare Other | Admitting: Speech Pathology

## 2021-11-03 ENCOUNTER — Other Ambulatory Visit: Payer: Self-pay

## 2021-11-03 ENCOUNTER — Ambulatory Visit: Payer: Medicare Other | Admitting: Speech Pathology

## 2021-11-03 DIAGNOSIS — R482 Apraxia: Secondary | ICD-10-CM

## 2021-11-03 DIAGNOSIS — R4701 Aphasia: Secondary | ICD-10-CM

## 2021-11-03 DIAGNOSIS — I63412 Cerebral infarction due to embolism of left middle cerebral artery: Secondary | ICD-10-CM

## 2021-11-04 NOTE — Patient Instructions (Signed)
Read information about the 1920's (provided by SLP) and create a written response

## 2021-11-04 NOTE — Therapy (Signed)
[place] MAIN [facility] SERVICES [address] [place], [place], [address] Phone: [phone]   Fax:  [phone]  Speech Language Pathology Treatment  Patient Details  Name: GURNIE DURIS MRN: [id number] Date of Birth: 1946/09/18 Referring Provider (SLP): Derinda Late   Encounter Date: 11/03/2021   End of Session - 11/04/21 1257     Visit Number 6    Number of Visits 25    Date for SLP Re-Evaluation 01/01/22    Authorization Type Medicare    Authorization Time Period 10/08/2021 thru 01/01/2022    Authorization - Visit Number 6    Progress Note Due on Visit 10    SLP Start Time 11    SLP Stop Time  1400    SLP Time Calculation (min) 60 min    Activity Tolerance Patient tolerated treatment well             Past Medical History:  Diagnosis Date   AAA (abdominal aortic aneurysm)    thoracoabdominal aortic aneursym, s/p right ilio-femoral bypass 10/12/16; 3V FEVAR with SMA, RRA, and LRA stenting 11/16/16 by Dr. Sammuel Hines [facility])   Benign neoplasm of colon    patient not sure   CHF (congestive heart failure) (HCC)    EF 45-50%   Chronic airway obstruction, not elsewhere classified    pt denies   Coronary artery disease    Inferior MI in 1996 treated with TPA. Cardiac cath in 2011 at [facility] showed chronic occlusion of the proximal RCA and proximal left circumflex without obstructive disease in the LAD. Non-ST elevation myocardial infarction in October 2013 while on vacation in [place]. Cardiac catheterization showed plaque rupture in the proximal ramus with thrombus. He had balloon angioplasty done. 01/28/21 cath    Coronary atherosclerosis of native coronary artery    Hepatitis A    teenager, no longer a problem   Hyperlipidemia    Intolerance to statins.   Hypertension    MI (myocardial infarction) ([place])    x 3 - last one 2013   Renal artery stenosis (HCC)    s/p bilateral renal artery angioplasty 07/26/18   Stroke ([place]) 01/2021    Ventricular tachycardia    s/p cardioverson 01/27/21    Past Surgical History:  Procedure Laterality Date   ABDOMINAL AORTIC ANEURYSM REPAIR     11/16/2016   ANGIOPLASTY     CARDIAC CATHETERIZATION  2011   CARDIAC CATHETERIZATION  2/14   [facility]; 95% lesion in the ramus intermedius.    CATARACT EXTRACTION W/PHACO Left 12/19/2018   Procedure: CATARACT EXTRACTION PHACO AND INTRAOCULAR LENS PLACEMENT ([place])  LEFT;  Surgeon: Eulogio Bear, MD;  Location: [place];  Service: Ophthalmology;  Laterality: Left;   CATARACT EXTRACTION W/PHACO Right 03/06/2019   Procedure: CATARACT EXTRACTION PHACO AND INTRAOCULAR LENS PLACEMENT (IOC)RIGHT;  Surgeon: Eulogio Bear, MD;  Location: [place];  Service: Ophthalmology;  Laterality: Right;   COLONOSCOPY WITH PROPOFOL N/A 03/20/2021   Procedure: COLONOSCOPY WITH PROPOFOL;  Surgeon: Lin Landsman, MD;  [facility] ENDOSCOPY;  Service: Gastroenterology;  Laterality: N/A;   CORONARY ANGIOPLASTY  1996   [facility] x1   CORONARY ANGIOPLASTY  2/14   Severe restenosis in the ostial ramus. Status post angioplasty and drug-eluting stent placement with a 2.5 x 18 mm Xience drug-eluting stent   ESOPHAGOGASTRODUODENOSCOPY N/A 08/03/2018   Procedure: ESOPHAGOGASTRODUODENOSCOPY (EGD);  Surgeon: Lin Landsman, MD;  Location: [facility] ENDOSCOPY;  Service: Gastroenterology;  Laterality: N/A;   ESOPHAGOGASTRODUODENOSCOPY (EGD)  WITH PROPOFOL N/A 11/03/2018   Procedure: ESOPHAGOGASTRODUODENOSCOPY (EGD) WITH PROPOFOL;  Surgeon: Lin Landsman, MD;  Location: [facility] ENDOSCOPY;  Service: Gastroenterology;  Laterality: N/A;   ESOPHAGOGASTRODUODENOSCOPY (EGD) WITH PROPOFOL N/A 12/06/2018   Procedure: ESOPHAGOGASTRODUODENOSCOPY (EGD) WITH PROPOFOL;  Surgeon: Lucilla Lame, MD;  Location: [facility] ENDOSCOPY;  Service: Endoscopy;  Laterality: N/A;   ESOPHAGOGASTRODUODENOSCOPY (EGD) WITH PROPOFOL N/A 03/19/2021   Procedure: ESOPHAGOGASTRODUODENOSCOPY (EGD) WITH  PROPOFOL;  Surgeon: Lin Landsman, MD;  Location: [facility] ENDOSCOPY;  Service: Gastroenterology;  Laterality: N/A;   ICD IMPLANT N/A 05/14/2021   Procedure: ICD IMPLANT;  Surgeon: [name], MD;  Location: [place] CV LAB;  Service: Cardiovascular;  Laterality: N/A;   IR CT HEAD LTD  01/30/2021   IR PERCUTANEOUS ART THROMBECTOMY/INFUSION INTRACRANIAL INC DIAG ANGIO  01/30/2021   LEFT HEART CATH AND CORONARY ANGIOGRAPHY N/A 01/28/2021   Procedure: LEFT HEART CATH AND CORONARY ANGIOGRAPHY;  Surgeon: Nelva Bush, MD;  Location: [place] CV LAB;  Service: Cardiovascular;  Laterality: N/A;   RADIOLOGY WITH ANESTHESIA N/A 01/30/2021   Procedure: IR WITH ANESTHESIA;  Surgeon: Luanne Bras, MD;  Location: [place];  Service: Radiology;  Laterality: N/A;   TONSILLECTOMY AND ADENOIDECTOMY     TRANSCAROTID ARTERY REVASCULARIZATION  Left 02/07/2021   Procedure: TRANSCAROTID ARTERY REVASCULARIZATION LEFT;  Surgeon: [name], MD;  Location: [place];  Service: Vascular;  Laterality: Left;    There were no vitals filed for this visit.   Subjective Assessment - 11/04/21 1244     Subjective "I feel better today" pt missed last session d/t stomach virus    Currently in Pain? No/denies                   ADULT SLP TREATMENT - 11/04/21 0001       Treatment Provided   Treatment provided Cognitive-Linquistic      Cognitive-Linquistic Treatment   Treatment focused on Aphasia;Patient/family/caregiver education    Skilled Treatment Pt continues to struggle with functionality of TalkPath Therapy app and as a result, he is not utilizing the app. Adaptations attempted such as having pt say the sentence that was provided in app and then writing sentence down. Pt was more independent with slowing rate when reading written sentence which resulted in 90% speech intelligibility. While he made several grammatical errors, he displayed little interest d/t decreased functionality of  sentences. Of note, pt appeared to have some ideomotor difficulty as evidenced by perseveratively writing down the same word in its misspelled form.              SLP Education - 11/04/21 1257     Education Details ideomotor apraxia    Person(s) Educated Patient    Methods Explanation;Demonstration;Verbal cues;Handout    Comprehension Verbalized understanding;Need further instruction              SLP Short Term Goals - 10/09/21 1637       SLP SHORT TERM GOAL #1   Title Pt will produce sentences in response to a question (e.g. whats your favorite holiday and why) with appropriate articulation at 50% accuracy given frequent moderate phonemic placement cues.    Baseline moderate impairments    Time 10    Period --   sessions   Status New      SLP SHORT TERM GOAL #2   Title Pt will expressively produce 4+ word sentence in response to situation/picture with 50% accuracy given moderate cues in order to increase ability to communicate basic  wants needs.    Baseline moderate impairments    Time 10    Period --   sessions   Status New      SLP SHORT TERM GOAL #3   Title Pt will write grammatically correct 5 word sentence about a basic picture with moderate assistance.    Baseline 3 word sentences    Time 10    Period --   sessions   Status New              SLP Long Term Goals - 10/09/21 1636       SLP LONG TERM GOAL #1   Title Pt will use functional communication skills for social interaction (e.g., greetings,  social etiquette, and short questions/simple sentences) with both familiar and unfamiliar  partners with 90% success.    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 Skilled ST intervention is required to target these impairments to  increase pt's functional independence and reduce caregiver burden.    Speech Therapy Frequency 2x / week    Duration 12 weeks    Treatment/Interventions SLP instruction and feedback;Patient/family education;Language facilitation    Potential to Achieve Goals Good    SLP Home Exercise Plan provided, see pt instructions section    Consulted and Agree with Plan of Care Patient             Patient will benefit from skilled therapeutic intervention in order to improve the following deficits and impairments:   Aphasia  Apraxia  Cerebral infarction due to embolism of left middle cerebral artery [facility])    Problem List Patient Active Problem List   Diagnosis Date Noted   Aortic aneurysm ([place]) 05/29/2021   ICD (implantable cardioverter-defibrillator) in place 05/14/2021   Ischemic cardiomyopathy 05/14/2021   NSVT (nonsustained ventricular tachycardia)    Melena 03/17/2021   Upper GI bleeding 03/16/2021   CKD (chronic kidney disease) stage 3, GFR 30-59 ml/min (HCC) 03/16/2021   Hypokalemia 03/16/2021   Carotid stenosis 02/07/2021   Cerebral thrombosis with cerebral infarction 01/30/2021   Cerebral embolism with cerebral infarction 01/30/2021   AKI (acute kidney injury) ([place])    Sustained VT (ventricular tachycardia) 01/27/2021   Peptic ulcer disease    Duodenal ulcer disease    GIB (gastrointestinal bleeding) 08/02/2018   Diabetes mellitus without complication ([place]) 13/05/6577   Erectile dysfunction 07/01/2015   Primary hypertension 08/09/2014   Pure hypercholesterolemia 46/96/2952   Chronic systolic heart failure ([place]) 08/04/2012   Coronary artery disease    Hyperlipidemia    AAA (abdominal aortic aneurysm)    NSTEMI (non-ST elevated myocardial infarction) ([place]) 07/09/2012   Hyperglycemia 07/08/2012   Shondell Fabel B. Rutherford Nail M.S., CCC-SLP, [facility] Pathologist Rehabilitation Services Office [phone]  Stormy Fabian, [place] 11/04/2021, 12:59 PM  [place] MAIN [facility] SERVICES [address] [place], [place], [address] Phone: [phone]   Fax:  [phone]   Name: INMER NIX MRN: [id number] Date of Birth: [birth year]

## 2021-11-05 ENCOUNTER — Ambulatory Visit: Payer: Medicare Other | Attending: Neurology | Admitting: Speech Pathology

## 2021-11-05 ENCOUNTER — Other Ambulatory Visit: Payer: Self-pay

## 2021-11-05 DIAGNOSIS — R482 Apraxia: Secondary | ICD-10-CM | POA: Diagnosis present

## 2021-11-05 DIAGNOSIS — I63412 Cerebral infarction due to embolism of left middle cerebral artery: Secondary | ICD-10-CM | POA: Diagnosis present

## 2021-11-05 DIAGNOSIS — R4701 Aphasia: Secondary | ICD-10-CM | POA: Insufficient documentation

## 2021-11-06 NOTE — Patient Instructions (Signed)
Write a paragraph about the presidents in the 1920's

## 2021-11-06 NOTE — Therapy (Signed)
[place] MAIN [facility] SERVICES [address] [place], [place], [address] Phone: [phone]   Fax:  [phone]  Speech Language Pathology Treatment  Patient Details  Name: [place] MRN: [id number] Date of Birth: [birth year] Referring Provider (SLP): Derinda Late   Encounter Date: 11/05/2021   End of Session - 11/06/21 1227     Visit Number 7    Number of Visits 25    Date for SLP Re-Evaluation 01/01/22    Authorization Type Medicare    Authorization Time Period 10/08/2021 thru 01/01/2022    Authorization - Visit Number 7    Progress Note Due on Visit 10    SLP Start Time 53    SLP Stop Time  1400    SLP Time Calculation (min) 60 min    Activity Tolerance Patient tolerated treatment well             Past Medical History:  Diagnosis Date   AAA (abdominal aortic aneurysm)    thoracoabdominal aortic aneursym, s/p right ilio-femoral bypass 10/12/16; 3V FEVAR with SMA, RRA, and LRA stenting 11/16/16 by Dr. Sammuel Hines [facility])   Benign neoplasm of colon    patient not sure   CHF (congestive heart failure) (HCC)    EF 45-50%   Chronic airway obstruction, not elsewhere classified    pt denies   Coronary artery disease    Inferior MI in 1996 treated with TPA. Cardiac cath in 2011 at [facility] showed chronic occlusion of the proximal RCA and proximal left circumflex without obstructive disease in the LAD. Non-ST elevation myocardial infarction in October 2013 while on vacation in [place]. Cardiac catheterization showed plaque rupture in the proximal ramus with thrombus. He had balloon angioplasty done. 01/28/21 cath    Coronary atherosclerosis of native coronary artery    Hepatitis A    teenager, no longer a problem   Hyperlipidemia    Intolerance to statins.   Hypertension    MI (myocardial infarction) ([place])    x 3 - last one 2013   Renal artery stenosis (HCC)    s/p bilateral renal artery angioplasty 07/26/18   Stroke ([place]) 01/2021    Ventricular tachycardia    s/p cardioverson 01/27/21    Past Surgical History:  Procedure Laterality Date   ABDOMINAL AORTIC ANEURYSM REPAIR     11/16/2016   ANGIOPLASTY     CARDIAC CATHETERIZATION  2011   CARDIAC CATHETERIZATION  2/14   [facility]; 95% lesion in the ramus intermedius.    CATARACT EXTRACTION W/PHACO Left 12/19/2018   Procedure: CATARACT EXTRACTION PHACO AND INTRAOCULAR LENS PLACEMENT ([place])  LEFT;  Surgeon: Eulogio Bear, MD;  Location: [place];  Service: Ophthalmology;  Laterality: Left;   CATARACT EXTRACTION W/PHACO Right 03/06/2019   Procedure: CATARACT EXTRACTION PHACO AND INTRAOCULAR LENS PLACEMENT (IOC)RIGHT;  Surgeon: Eulogio Bear, MD;  Location: [place];  Service: Ophthalmology;  Laterality: Right;   COLONOSCOPY WITH PROPOFOL N/A 03/20/2021   Procedure: COLONOSCOPY WITH PROPOFOL;  Surgeon: Lin Landsman, MD;  Location: [facility] ENDOSCOPY;  Service: Gastroenterology;  Laterality: N/A;   CORONARY ANGIOPLASTY  1996   [facility] x1   CORONARY ANGIOPLASTY  2/14   Severe restenosis in the ostial ramus. Status post angioplasty and drug-eluting stent placement with a 2.5 x 18 mm Xience drug-eluting stent   ESOPHAGOGASTRODUODENOSCOPY N/A 08/03/2018   Procedure: ESOPHAGOGASTRODUODENOSCOPY (EGD);  Surgeon: Lin Landsman, MD;  Location: [facility] ENDOSCOPY;  Service: Gastroenterology;  Laterality: N/A;   ESOPHAGOGASTRODUODENOSCOPY (EGD)  WITH PROPOFOL N/A 11/03/2018   Procedure: ESOPHAGOGASTRODUODENOSCOPY (EGD) WITH PROPOFOL;  Surgeon: Lin Landsman, MD;  Location: [facility] ENDOSCOPY;  Service: Gastroenterology;  Laterality: N/A;   ESOPHAGOGASTRODUODENOSCOPY (EGD) WITH PROPOFOL N/A 12/06/2018   Procedure: ESOPHAGOGASTRODUODENOSCOPY (EGD) WITH PROPOFOL;  Surgeon: Lucilla Lame, MD;  Location: [facility] ENDOSCOPY;  Service: Endoscopy;  Laterality: N/A;   ESOPHAGOGASTRODUODENOSCOPY (EGD) WITH PROPOFOL N/A 03/19/2021   Procedure: ESOPHAGOGASTRODUODENOSCOPY (EGD) WITH  PROPOFOL;  Surgeon: Lin Landsman, MD;  Location: [facility] ENDOSCOPY;  Service: Gastroenterology;  Laterality: N/A;   ICD IMPLANT N/A 05/14/2021   Procedure: ICD IMPLANT;  Surgeon: [name], MD;  Location: [place] CV LAB;  Service: Cardiovascular;  Laterality: N/A;   IR CT HEAD LTD  01/30/2021   IR PERCUTANEOUS ART THROMBECTOMY/INFUSION INTRACRANIAL INC DIAG ANGIO  01/30/2021   LEFT HEART CATH AND CORONARY ANGIOGRAPHY N/A 01/28/2021   Procedure: LEFT HEART CATH AND CORONARY ANGIOGRAPHY;  Surgeon: Nelva Bush, MD;  Location: [place] CV LAB;  Service: Cardiovascular;  Laterality: N/A;   RADIOLOGY WITH ANESTHESIA N/A 01/30/2021   Procedure: IR WITH ANESTHESIA;  Surgeon: Luanne Bras, MD;  Location: [place];  Service: Radiology;  Laterality: N/A;   TONSILLECTOMY AND ADENOIDECTOMY     TRANSCAROTID ARTERY REVASCULARIZATION  Left 02/07/2021   Procedure: TRANSCAROTID ARTERY REVASCULARIZATION LEFT;  Surgeon: [name], MD;  Location: [place];  Service: Vascular;  Laterality: Left;    There were no vitals filed for this visit.   Subjective Assessment - 11/06/21 1224     Subjective pt pleasant, stated he enjoyed homework about the 1920's    Currently in Pain? No/denies                   ADULT SLP TREATMENT - 11/06/21 0001       Treatment Provided   Treatment provided Cognitive-Linquistic      Cognitive-Linquistic Treatment   Treatment focused on Aphasia;Apraxia;Patient/family/caregiver education    Skilled Treatment To target sentence generation:  Pharmacist, hospital ([facility]) was utilized. The pt generated 4 subjects and objects for 4 verbs, for a total of 16 subject objects. Pt required mild to moderate cues. Pt generated 16 complex sentences by answering "wh" questions. Pt required  mild to moderate cues to generate complex sentences.              SLP Education - 11/06/21 1227     Education Details grammatically correct complex  sentences    Person(s) Educated Patient    Methods Explanation;Demonstration;Verbal cues;Handout    Comprehension Verbalized understanding;Need further instruction              SLP Short Term Goals - 10/09/21 1637       SLP SHORT TERM GOAL #1   Title Pt will produce sentences in response to a question (e.g. whats your favorite holiday and why) with appropriate articulation at 50% accuracy given frequent moderate phonemic placement cues.    Baseline moderate impairments    Time 10    Period --   sessions   Status New      SLP SHORT TERM GOAL #2   Title Pt will expressively produce 4+ word sentence in response to situation/picture with 50% accuracy given moderate cues in order to increase ability to communicate basic wants needs.    Baseline moderate impairments    Time 10    Period --   sessions   Status New      SLP SHORT TERM GOAL #3   Title  Pt will write grammatically correct 5 word sentence about a basic picture with moderate assistance.    Baseline 3 word sentences    Time 10    Period --   sessions   Status New              SLP Long Term Goals - 10/09/21 1636       SLP LONG TERM GOAL #1   Title Pt will use functional communication skills for social interaction (e.g., greetings,  social etiquette, and short questions/simple sentences) with both familiar and unfamiliar  partners with 90% success.    Baseline moderate impairment    Time 12    Period Weeks    Status New    Target Date 01/01/22              Plan - 11/06/21 1227     Clinical Impression Statement Pt wrote a grammatically correct cohesive paragraph response to article on the 1920's. Pt appeared to enjoy using the [employer] techniqueto create sentences and complex written senetences about familiar family members. He requires mild to moderate cues for generation of sentences and moderate assistance to identify grammatical errors with printed sentences. 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    Speech Therapy Frequency 2x / week    Duration 12 weeks    Treatment/Interventions SLP instruction and feedback;Patient/family education;Language facilitation    Potential to Achieve Goals Good    Consulted and Agree with Plan of Care Patient             Patient will benefit from skilled therapeutic intervention in order to improve the following deficits and impairments:   Aphasia  Apraxia  Cerebral infarction due to embolism of left middle cerebral artery [facility])    Problem List Patient Active Problem List   Diagnosis Date Noted   Aortic aneurysm ([place]) 05/29/2021   ICD (implantable cardioverter-defibrillator) in place 05/14/2021   Ischemic cardiomyopathy 05/14/2021   NSVT (nonsustained ventricular tachycardia)    Melena 03/17/2021   Upper GI bleeding 03/16/2021   CKD (chronic kidney disease) stage 3, GFR 30-59 ml/min (HCC) 03/16/2021   Hypokalemia 03/16/2021   Carotid stenosis 02/07/2021   Cerebral thrombosis with cerebral infarction 01/30/2021   Cerebral embolism with cerebral infarction 01/30/2021   AKI (acute kidney injury) ([place])    Sustained VT (ventricular tachycardia) 01/27/2021   Peptic ulcer disease    Duodenal ulcer disease    GIB (gastrointestinal bleeding) 08/02/2018   Diabetes mellitus without complication ([place]) 24/23/5361   Erectile dysfunction 07/01/2015   Primary hypertension 08/09/2014   Pure hypercholesterolemia 44/31/5400   Chronic systolic heart failure ([place]) 08/04/2012   Coronary artery disease    Hyperlipidemia    AAA (abdominal aortic aneurysm)    NSTEMI (non-ST elevated myocardial infarction) ([place]) 07/09/2012   Hyperglycemia 07/08/2012   Landa Mullinax B. Rutherford Nail M.S., CCC-SLP, [facility] Pathologist Rehabilitation Services Office [place], [place] 11/06/2021, 12:33  PM  [place] MAIN [facility] SERVICES [address] [place], [place], [address] Phone: [phone]   Fax:  [phone]   Name: Garrett [place] MRN: [id number] Date of Birth: 08/30/1946

## 2021-11-10 ENCOUNTER — Ambulatory Visit: Payer: Medicare Other | Admitting: Speech Pathology

## 2021-11-10 ENCOUNTER — Other Ambulatory Visit: Payer: Self-pay

## 2021-11-10 DIAGNOSIS — R4701 Aphasia: Secondary | ICD-10-CM | POA: Diagnosis not present

## 2021-11-10 DIAGNOSIS — I63412 Cerebral infarction due to embolism of left middle cerebral artery: Secondary | ICD-10-CM

## 2021-11-10 DIAGNOSIS — R482 Apraxia: Secondary | ICD-10-CM

## 2021-11-11 NOTE — Patient Instructions (Signed)
Write a paragraph describe which president caused the depression with supporting details

## 2021-11-11 NOTE — Therapy (Signed)
[place] MAIN [facility] SERVICES [address] [place], [place], [address] Phone: [phone]   Fax:  [phone]  Speech Language Pathology Treatment  Patient Details  Name: Garrett [place] MRN: [id number] Date of Birth: November 11, 1945 Referring Provider (SLP): Derinda Late   Encounter Date: 11/10/2021   End of Session - 11/11/21 1939     Visit Number 8    Number of Visits 25    Date for SLP Re-Evaluation 01/01/22    Authorization Type Medicare    Authorization Time Period 10/08/2021 thru 01/01/2022    Authorization - Visit Number 8    Progress Note Due on Visit 10    SLP Start Time 7    SLP Stop Time  1400    SLP Time Calculation (min) 60 min    Activity Tolerance Patient tolerated treatment well             Past Medical History:  Diagnosis Date   AAA (abdominal aortic aneurysm)    thoracoabdominal aortic aneursym, s/p right ilio-femoral bypass 10/12/16; 3V FEVAR with SMA, RRA, and LRA stenting 11/16/16 by Dr. Sammuel Hines [facility])   Benign neoplasm of colon    patient not sure   CHF (congestive heart failure) (HCC)    EF 45-50%   Chronic airway obstruction, not elsewhere classified    pt denies   Coronary artery disease    Inferior MI in 1996 treated with TPA. Cardiac cath in 2011 at [facility] showed chronic occlusion of the proximal RCA and proximal left circumflex without obstructive disease in the LAD. Non-ST elevation myocardial infarction in October 2013 while on vacation in [place]. Cardiac catheterization showed plaque rupture in the proximal ramus with thrombus. He had balloon angioplasty done. 01/28/21 cath    Coronary atherosclerosis of native coronary artery    Hepatitis A    teenager, no longer a problem   Hyperlipidemia    Intolerance to statins.   Hypertension    MI (myocardial infarction) ([place])    x 3 - last one 2013   Renal artery stenosis (HCC)    s/p bilateral renal artery angioplasty 07/26/18   Stroke ([place]) 01/2021    Ventricular tachycardia    s/p cardioverson 01/27/21    Past Surgical History:  Procedure Laterality Date   ABDOMINAL AORTIC ANEURYSM REPAIR     11/16/2016   ANGIOPLASTY     CARDIAC CATHETERIZATION  2011   CARDIAC CATHETERIZATION  2/14   [facility]; 95% lesion in the ramus intermedius.    CATARACT EXTRACTION W/PHACO Left 12/19/2018   Procedure: CATARACT EXTRACTION PHACO AND INTRAOCULAR LENS PLACEMENT ([place])  LEFT;  Surgeon: Eulogio Bear, MD;  Location: [place];  Service: Ophthalmology;  Laterality: Left;   CATARACT EXTRACTION W/PHACO Right 03/06/2019   Procedure: CATARACT EXTRACTION PHACO AND INTRAOCULAR LENS PLACEMENT (IOC)RIGHT;  Surgeon: Eulogio Bear, MD;  Location: [place];  Service: Ophthalmology;  Laterality: Right;   COLONOSCOPY WITH PROPOFOL N/A 03/20/2021   Procedure: COLONOSCOPY WITH PROPOFOL;  Surgeon: Lin Landsman, MD;  Location: [facility] ENDOSCOPY;  Service: Gastroenterology;  Laterality: N/A;   CORONARY ANGIOPLASTY  1996   [facility] x1   CORONARY ANGIOPLASTY  2/14   Severe restenosis in the ostial ramus. Status post angioplasty and drug-eluting stent placement with a 2.5 x 18 mm Xience drug-eluting stent   ESOPHAGOGASTRODUODENOSCOPY N/A 08/03/2018   Procedure: ESOPHAGOGASTRODUODENOSCOPY (EGD);  Surgeon: Lin Landsman, MD;  Location: [facility] ENDOSCOPY;  Service: Gastroenterology;  Laterality: N/A;   ESOPHAGOGASTRODUODENOSCOPY (EGD)  WITH PROPOFOL N/A 11/03/2018   Procedure: ESOPHAGOGASTRODUODENOSCOPY (EGD) WITH PROPOFOL;  Surgeon: Lin Landsman, MD;  Location: [facility] ENDOSCOPY;  Service: Gastroenterology;  Laterality: N/A;   ESOPHAGOGASTRODUODENOSCOPY (EGD) WITH PROPOFOL N/A 12/06/2018   Procedure: ESOPHAGOGASTRODUODENOSCOPY (EGD) WITH PROPOFOL;  Surgeon: Lucilla Lame, MD;  Location: [facility] ENDOSCOPY;  Service: Endoscopy;  Laterality: N/A;   ESOPHAGOGASTRODUODENOSCOPY (EGD) WITH PROPOFOL N/A 03/19/2021   Procedure: ESOPHAGOGASTRODUODENOSCOPY (EGD) WITH  PROPOFOL;  Surgeon: Lin Landsman, MD;  Location: [facility] ENDOSCOPY;  Service: Gastroenterology;  Laterality: N/A;   ICD IMPLANT N/A 05/14/2021   Procedure: ICD IMPLANT;  Surgeon: [name], MD;  Location: [place] CV LAB;  Service: Cardiovascular;  Laterality: N/A;   IR CT HEAD LTD  01/30/2021   IR PERCUTANEOUS ART THROMBECTOMY/INFUSION INTRACRANIAL INC DIAG ANGIO  01/30/2021   LEFT HEART CATH AND CORONARY ANGIOGRAPHY N/A 01/28/2021   Procedure: LEFT HEART CATH AND CORONARY ANGIOGRAPHY;  Surgeon: Nelva Bush, MD;  Location: [place] CV LAB;  Service: Cardiovascular;  Laterality: N/A;   RADIOLOGY WITH ANESTHESIA N/A 01/30/2021   Procedure: IR WITH ANESTHESIA;  Surgeon: Luanne Bras, MD;  Location: [place];  Service: Radiology;  Laterality: N/A;   TONSILLECTOMY AND ADENOIDECTOMY     TRANSCAROTID ARTERY REVASCULARIZATION  Left 02/07/2021   Procedure: TRANSCAROTID ARTERY REVASCULARIZATION LEFT;  Surgeon: [name], MD;  Location: [place];  Service: Vascular;  Laterality: Left;    There were no vitals filed for this visit.   Subjective Assessment - 11/11/21 1936     Subjective pt pleasant, motivated "I had a run in with my trash can" had a bruise on his hand    Currently in Pain? No/denies                   ADULT SLP TREATMENT - 11/11/21 0001       Treatment Provided   Treatment provided Cognitive-Linquistic      Cognitive-Linquistic Treatment   Treatment focused on Aphasia;Apraxia;Patient/family/caregiver education    Skilled Treatment To target sentence generation:  Pharmacist, hospital (VNeST) was utilized. The pt generated 4 subjects and objects for 4 verbs, for a total of 16 subject objects. Pt required mild cues. Pt generated 16 complex sentences by answering "wh" questions. Pt's writing was free of grammatical errors.              SLP Education - 11/11/21 1939     Education Details grammatically correct complex  sentences    Person(s) Educated Patient    Methods Explanation;Demonstration;Verbal cues;Handout    Comprehension Verbalized understanding;Need further instruction;Returned demonstration              SLP Short Term Goals - 10/09/21 1637       SLP SHORT TERM GOAL #1   Title Pt will produce sentences in response to a question (e.g. whats your favorite holiday and why) with appropriate articulation at 50% accuracy given frequent moderate phonemic placement cues.    Baseline moderate impairments    Time 10    Period --   sessions   Status New      SLP SHORT TERM GOAL #2   Title Pt will expressively produce 4+ word sentence in response to situation/picture with 50% accuracy given moderate cues in order to increase ability to communicate basic wants needs.    Baseline moderate impairments    Time 10    Period --   sessions   Status New      SLP SHORT TERM GOAL #  3   Title Pt will write grammatically correct 5 word sentence about a basic picture with moderate assistance.    Baseline 3 word sentences    Time 10    Period --   sessions   Status New              SLP Long Term Goals - 10/09/21 1636       SLP LONG TERM GOAL #1   Title Pt will use functional communication skills for social interaction (e.g., greetings,  social etiquette, and short questions/simple sentences) with both familiar and unfamiliar  partners with 90% success.    Baseline moderate impairment    Time 12    Period Weeks    Status New    Target Date 01/01/22              Plan - 11/11/21 1939     Clinical Impression Statement Pt wrote an even better paragraph describing the presidents during the 28's. Pt had taken some notes, wrote a rough draft and then a final draft. Much improved. Pt also demonstrated improved ability to generate sentences during VnEST therapy activity. Skilled ST intervention continues to be required to target pt's moderate word finding deficits and apraxia of speech.     Speech Therapy Frequency 2x / week    Duration 12 weeks    Treatment/Interventions SLP instruction and feedback;Patient/family education;Language facilitation    Potential to Achieve Goals Good    SLP Home Exercise Plan provided, see pt instructions section    Consulted and Agree with Plan of Care Patient             Patient will benefit from skilled therapeutic intervention in order to improve the following deficits and impairments:   Aphasia  Apraxia  Cerebral infarction due to embolism of left middle cerebral artery [facility])    Problem List Patient Active Problem List   Diagnosis Date Noted   Aortic aneurysm ([place]) 05/29/2021   ICD (implantable cardioverter-defibrillator) in place 05/14/2021   Ischemic cardiomyopathy 05/14/2021   NSVT (nonsustained ventricular tachycardia)    Melena 03/17/2021   Upper GI bleeding 03/16/2021   CKD (chronic kidney disease) stage 3, GFR 30-59 ml/min (HCC) 03/16/2021   Hypokalemia 03/16/2021   Carotid stenosis 02/07/2021   Cerebral thrombosis with cerebral infarction 01/30/2021   Cerebral embolism with cerebral infarction 01/30/2021   AKI (acute kidney injury) ([place])    Sustained VT (ventricular tachycardia) 01/27/2021   Peptic ulcer disease    Duodenal ulcer disease    GIB (gastrointestinal bleeding) 08/02/2018   Diabetes mellitus without complication ([place]) 68/09/7516   Erectile dysfunction 07/01/2015   Primary hypertension 08/09/2014   Pure hypercholesterolemia 00/17/4944   Chronic systolic heart failure ([place]) 08/04/2012   Coronary artery disease    Hyperlipidemia    AAA (abdominal aortic aneurysm)    NSTEMI (non-ST elevated myocardial infarction) ([place]) 07/09/2012   Hyperglycemia 07/08/2012   Marsheila Alejo B. Rutherford Nail M.S., CCC-SLP, [facility] Speech-Language Pathologist Rehabilitation Services Office [phone], [place] 11/11/2021, 7:43 PM  [place] MAIN [facility] SERVICES [address]  [place], [place], [address] Phone: [phone]   Fax:  [phone]   Name: [place] MRN: [id number] Date of Birth: 05/16/1946

## 2021-11-12 ENCOUNTER — Other Ambulatory Visit: Payer: Self-pay

## 2021-11-12 ENCOUNTER — Ambulatory Visit: Payer: Medicare Other | Admitting: Speech Pathology

## 2021-11-12 ENCOUNTER — Ambulatory Visit (INDEPENDENT_AMBULATORY_CARE_PROVIDER_SITE_OTHER): Payer: Medicare Other

## 2021-11-12 DIAGNOSIS — R482 Apraxia: Secondary | ICD-10-CM

## 2021-11-12 DIAGNOSIS — I63412 Cerebral infarction due to embolism of left middle cerebral artery: Secondary | ICD-10-CM

## 2021-11-12 DIAGNOSIS — R4701 Aphasia: Secondary | ICD-10-CM

## 2021-11-12 DIAGNOSIS — I255 Ischemic cardiomyopathy: Secondary | ICD-10-CM

## 2021-11-12 LAB — CUP PACEART REMOTE DEVICE CHECK
Date Time Interrogation Session: 20230208085622
Implantable Lead Implant Date: 20220810
Implantable Lead Location: 753860
Implantable Lead Model: 436909
Implantable Lead Serial Number: 81459128
Implantable Pulse Generator Implant Date: 20220810
Pulse Gen Model: 429525
Pulse Gen Serial Number: 84855891

## 2021-11-12 NOTE — Patient Instructions (Signed)
Using strategies in session, expand on last 2 points about the cause of the [facility] Depression

## 2021-11-12 NOTE — Therapy (Signed)
[place] MAIN [facility] SERVICES [address] [place], [place], [address] Phone: [phone]   Fax:  [phone]  Speech Language Pathology Treatment  Patient Details  Name: Garrett Hunter MRN: [id number] Date of Birth: 1946/07/17 Referring Provider (SLP): Derinda Late   Encounter Date: 11/12/2021   End of Session - 11/12/21 1931     Visit Number 9    Number of Visits 25    Date for SLP Re-Evaluation 01/01/22    Authorization Type Medicare    Authorization Time Period 10/08/2021 thru 01/01/2022    Authorization - Visit Number 9    Progress Note Due on Visit 10    SLP Start Time 9    SLP Stop Time  1400    SLP Time Calculation (min) 60 min    Activity Tolerance Patient tolerated treatment well             Past Medical History:  Diagnosis Date   AAA (abdominal aortic aneurysm)    thoracoabdominal aortic aneursym, s/p right ilio-femoral bypass 10/12/16; 3V FEVAR with SMA, RRA, and LRA stenting 11/16/16 by Dr. Sammuel Hines [facility])   Benign neoplasm of colon    patient not sure   CHF (congestive heart failure) (HCC)    EF 45-50%   Chronic airway obstruction, not elsewhere classified    pt denies   Coronary artery disease    Inferior MI in 1996 treated with TPA. Cardiac cath in 2011 at [facility] showed chronic occlusion of the proximal RCA and proximal left circumflex without obstructive disease in the LAD. Non-ST elevation myocardial infarction in October 2013 while on vacation in [place]. Cardiac catheterization showed plaque rupture in the proximal ramus with thrombus. He had balloon angioplasty done. 01/28/21 cath    Coronary atherosclerosis of native coronary artery    Hepatitis A    teenager, no longer a problem   Hyperlipidemia    Intolerance to statins.   Hypertension    MI (myocardial infarction) ([place])    x 3 - last one 2013   Renal artery stenosis (HCC)    s/p bilateral renal artery angioplasty 07/26/18   Stroke ([place]) 01/2021    Ventricular tachycardia    s/p cardioverson 01/27/21    Past Surgical History:  Procedure Laterality Date   ABDOMINAL AORTIC ANEURYSM REPAIR     11/16/2016   ANGIOPLASTY     CARDIAC CATHETERIZATION  2011   CARDIAC CATHETERIZATION  2/14   [facility]; 95% lesion in the ramus intermedius.    CATARACT EXTRACTION W/PHACO Left 12/19/2018   Procedure: CATARACT EXTRACTION PHACO AND INTRAOCULAR LENS PLACEMENT ([place])  LEFT;  Surgeon: Eulogio Bear, MD;  Location: [place];  Service: Ophthalmology;  Laterality: Left;   CATARACT EXTRACTION W/PHACO Right 03/06/2019   Procedure: CATARACT EXTRACTION PHACO AND INTRAOCULAR LENS PLACEMENT (IOC)RIGHT;  Surgeon: Eulogio Bear, MD;  Location: [place];  Service: Ophthalmology;  Laterality: Right;   COLONOSCOPY WITH PROPOFOL N/A 03/20/2021   Procedure: COLONOSCOPY WITH PROPOFOL;  Surgeon: Lin Landsman, MD;  Location: [facility] ENDOSCOPY;  Service: Gastroenterology;  Laterality: N/A;   CORONARY ANGIOPLASTY  1996   [facility] x1   CORONARY ANGIOPLASTY  2/14   Severe restenosis in the ostial ramus. Status post angioplasty and drug-eluting stent placement with a 2.5 x 18 mm Xience drug-eluting stent   ESOPHAGOGASTRODUODENOSCOPY N/A 08/03/2018   Procedure: ESOPHAGOGASTRODUODENOSCOPY (EGD);  Surgeon: Lin Landsman, MD;  Location: [facility] ENDOSCOPY;  Service: Gastroenterology;  Laterality: N/A;   ESOPHAGOGASTRODUODENOSCOPY (EGD)  WITH PROPOFOL N/A 11/03/2018   Procedure: ESOPHAGOGASTRODUODENOSCOPY (EGD) WITH PROPOFOL;  Surgeon: Lin Landsman, MD;  Location: [facility] ENDOSCOPY;  Service: Gastroenterology;  Laterality: N/A;   ESOPHAGOGASTRODUODENOSCOPY (EGD) WITH PROPOFOL N/A 12/06/2018   Procedure: ESOPHAGOGASTRODUODENOSCOPY (EGD) WITH PROPOFOL;  Surgeon: Lucilla Lame, MD;  Location: [facility] ENDOSCOPY;  Service: Endoscopy;  Laterality: N/A;   ESOPHAGOGASTRODUODENOSCOPY (EGD) WITH PROPOFOL N/A 03/19/2021   Procedure: ESOPHAGOGASTRODUODENOSCOPY (EGD) WITH  PROPOFOL;  Surgeon: Lin Landsman, MD;  Location: [facility] ENDOSCOPY;  Service: Gastroenterology;  Laterality: N/A;   ICD IMPLANT N/A 05/14/2021   Procedure: ICD IMPLANT;  Surgeon: [name], MD;  Location: [place] CV LAB;  Service: Cardiovascular;  Laterality: N/A;   IR CT HEAD LTD  01/30/2021   IR PERCUTANEOUS ART THROMBECTOMY/INFUSION INTRACRANIAL INC DIAG ANGIO  01/30/2021   LEFT HEART CATH AND CORONARY ANGIOGRAPHY N/A 01/28/2021   Procedure: LEFT HEART CATH AND CORONARY ANGIOGRAPHY;  Surgeon: Nelva Bush, MD;  Location: [place] CV LAB;  Service: Cardiovascular;  Laterality: N/A;   RADIOLOGY WITH ANESTHESIA N/A 01/30/2021   Procedure: IR WITH ANESTHESIA;  Surgeon: Luanne Bras, MD;  Location: [place];  Service: Radiology;  Laterality: N/A;   TONSILLECTOMY AND ADENOIDECTOMY     TRANSCAROTID ARTERY REVASCULARIZATION  Left 02/07/2021   Procedure: TRANSCAROTID ARTERY REVASCULARIZATION LEFT;  Surgeon: [name], MD;  Location: [place];  Service: Vascular;  Laterality: Left;    There were no vitals filed for this visit.   Subjective Assessment - 11/12/21 1915     Subjective pt's speech is improved but still marginally worse than last week    Currently in Pain? No/denies                   ADULT SLP TREATMENT - 11/12/21 0001       Treatment Provided   Treatment provided Cognitive-Linquistic      Cognitive-Linquistic Treatment   Treatment focused on Aphasia;Apraxia;Patient/family/caregiver education    Skilled Treatment Skilled treatment session focused on pt's expressive language and writing ability. Pt had created bulleted points about causes of the Great Depression. SLP facilitated session by providing moderate assistance to create grammatically cogent complex sentences.              SLP Education - 11/12/21 1930     Education Details semantics and syntax    Person(s) Educated Patient    Methods Explanation;Demonstration;Verbal  cues;Handout    Comprehension Verbalized understanding;Returned demonstration;Need further instruction              SLP Short Term Goals - 10/09/21 1637       SLP SHORT TERM GOAL #1   Title Pt will produce sentences in response to a question (e.g. whats your favorite holiday and why) with appropriate articulation at 50% accuracy given frequent moderate phonemic placement cues.    Baseline moderate impairments    Time 10    Period --   sessions   Status New      SLP SHORT TERM GOAL #2   Title Pt will expressively produce 4+ word sentence in response to situation/picture with 50% accuracy given moderate cues in order to increase ability to communicate basic wants needs.    Baseline moderate impairments    Time 10    Period --   sessions   Status New      SLP SHORT TERM GOAL #3   Title Pt will write grammatically correct 5 word sentence about a basic picture with moderate assistance.  Baseline 3 word sentences    Time 10    Period --   sessions   Status New              SLP Long Term Goals - 10/09/21 1636       SLP LONG TERM GOAL #1   Title Pt will use functional communication skills for social interaction (e.g., greetings,  social etiquette, and short questions/simple sentences) with both familiar and unfamiliar  partners with 90% success.    Baseline moderate impairment    Time 12    Period Weeks    Status New    Target Date 01/01/22              Plan - 11/12/21 1931     Clinical Impression Statement Pt wrote bulleted points about causes of the Great Depression. He continues to require moderate assistance for word finding, syntax and connectivity of points. 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    Speech Therapy Frequency 2x / week    Duration 12 weeks    Treatment/Interventions SLP instruction and feedback;Patient/family education;Language facilitation    Potential to Achieve  Goals Good    SLP Home Exercise Plan provided, see pt instructions section    Consulted and Agree with Plan of Care Patient             Patient will benefit from skilled therapeutic intervention in order to improve the following deficits and impairments:   Aphasia  Apraxia  Cerebral infarction due to embolism of left middle cerebral artery [facility])    Problem List Patient Active Problem List   Diagnosis Date Noted   Aortic aneurysm ([place]) 05/29/2021   ICD (implantable cardioverter-defibrillator) in place 05/14/2021   Ischemic cardiomyopathy 05/14/2021   NSVT (nonsustained ventricular tachycardia)    [place] 03/17/2021   Upper GI bleeding 03/16/2021   CKD (chronic kidney disease) stage 3, GFR 30-59 ml/min (HCC) 03/16/2021   Hypokalemia 03/16/2021   Carotid stenosis 02/07/2021   Cerebral thrombosis with cerebral infarction 01/30/2021   Cerebral embolism with cerebral infarction 01/30/2021   AKI (acute kidney injury) ([place])    Sustained VT (ventricular tachycardia) 01/27/2021   Peptic ulcer disease    Duodenal ulcer disease    GIB (gastrointestinal bleeding) 08/02/2018   Diabetes mellitus without complication ([place]) 70/35/0093   Erectile dysfunction 07/01/2015   Primary hypertension 08/09/2014   Pure hypercholesterolemia 81/82/9937   Chronic systolic heart failure ([place]) 08/04/2012   Coronary artery disease    Hyperlipidemia    AAA (abdominal aortic aneurysm)    NSTEMI (non-ST elevated myocardial infarction) ([place]) 07/09/2012   Hyperglycemia 07/08/2012   Cinde Ebert B. Rutherford Nail M.S., CCC-SLP, [facility] Speech-Language Pathologist Rehabilitation Services Office [phone], [place] 11/12/2021, 7:34 PM  [place] MAIN [facility] SERVICES [address] [place], [place], [address] Phone: [phone]   Fax:  [phone]   Name: Garrett Hunter MRN: [id number] Date of Birth: 07/25/1946

## 2021-11-17 ENCOUNTER — Other Ambulatory Visit: Payer: Self-pay

## 2021-11-17 ENCOUNTER — Ambulatory Visit: Payer: Medicare Other | Admitting: Speech Pathology

## 2021-11-17 DIAGNOSIS — R4701 Aphasia: Secondary | ICD-10-CM

## 2021-11-17 DIAGNOSIS — I63412 Cerebral infarction due to embolism of left middle cerebral artery: Secondary | ICD-10-CM

## 2021-11-17 DIAGNOSIS — R482 Apraxia: Secondary | ICD-10-CM

## 2021-11-17 NOTE — Progress Notes (Signed)
Remote ICD transmission.   

## 2021-11-19 ENCOUNTER — Other Ambulatory Visit
Admission: RE | Admit: 2021-11-19 | Discharge: 2021-11-19 | Disposition: A | Discharge status: Home / Self Care | Payer: Medicare Other | Source: Ambulatory Visit | Attending: Cardiology | Admitting: Cardiology

## 2021-11-19 ENCOUNTER — Ambulatory Visit (INDEPENDENT_AMBULATORY_CARE_PROVIDER_SITE_OTHER): Payer: Medicare Other | Admitting: Cardiology

## 2021-11-19 ENCOUNTER — Encounter: Payer: Self-pay | Admitting: Cardiology

## 2021-11-19 ENCOUNTER — Other Ambulatory Visit: Payer: Self-pay

## 2021-11-19 VITALS — BP 120/60 | HR 62 | Ht 71.0 in | Wt 197.0 lb

## 2021-11-19 DIAGNOSIS — Z79899 Other long term (current) drug therapy: Secondary | ICD-10-CM | POA: Diagnosis present

## 2021-11-19 DIAGNOSIS — I472 Ventricular tachycardia, unspecified: Secondary | ICD-10-CM | POA: Insufficient documentation

## 2021-11-19 DIAGNOSIS — I5022 Chronic systolic (congestive) heart failure: Secondary | ICD-10-CM

## 2021-11-19 DIAGNOSIS — I4729 Other ventricular tachycardia: Secondary | ICD-10-CM

## 2021-11-19 DIAGNOSIS — I255 Ischemic cardiomyopathy: Secondary | ICD-10-CM

## 2021-11-19 LAB — COMPREHENSIVE METABOLIC PANEL
ALT: 39 U/L (ref 0–44)
AST: 29 U/L (ref 15–41)
Albumin: 4.3 g/dL (ref 3.5–5.0)
Alkaline Phosphatase: 103 U/L (ref 38–126)
Anion gap: 4 — ABNORMAL LOW (ref 5–15)
BUN: 16 mg/dL (ref 8–23)
CO2: 29 mmol/L (ref 22–32)
Calcium: 9.1 mg/dL (ref 8.9–10.3)
Chloride: 105 mmol/L (ref 98–111)
Creatinine, Ser: 1.14 mg/dL (ref 0.61–1.24)
GFR, Estimated: >60 mL/min (ref >60)
Glucose, Bld: 90 mg/dL (ref 70–99)
Potassium: 4.1 mmol/L (ref 3.5–5.1)
Sodium: 138 mmol/L (ref 135–145)
Total Bilirubin: 0.7 mg/dL (ref 0.3–1.2)
Total Protein: 7.1 g/dL (ref 6.5–8.1)

## 2021-11-19 LAB — T4, FREE: Free T4: 0.7 ng/dL (ref 0.61–1.12)

## 2021-11-19 LAB — TSH: TSH: 11.593 u[IU]/mL — ABNORMAL HIGH (ref 0.350–4.500)

## 2021-11-19 MED ORDER — AMIODARONE HCL 200 MG PO TABS: 200.0000 mg | ORAL_TABLET | Freq: Every day | ORAL | 3 refills | Status: DC

## 2021-11-19 NOTE — Patient Instructions (Signed)
Use note pad app on phone to write words to improve listener understanding

## 2021-11-19 NOTE — Patient Instructions (Addendum)
Medications: Reduce Amiodarone to 200 mg daily Your physician recommends that you continue on your current medications as directed. Please refer to the Current Medication list given to you today. *If you need a refill on your cardiac medications before your next appointment, please call your pharmacy*  Lab Work: CMP, TSH, Free T4 If you have labs (blood work) drawn today and your tests are completely normal, you will receive your results only by: [place] (if you have MyChart) OR A paper copy in the mail If you have any lab test that is abnormal or we need to change your treatment, we will call you to review the results.  Testing/Procedures: None.  Follow-Up: At [facility], you and your health needs are our priority.  As part of our continuing mission to provide you with exceptional heart care, we have created designated Provider Care Teams.  These Care Teams include your primary Cardiologist (physician) and Advanced Practice Providers (APPs -  Physician Assistants and Nurse Practitioners) who all work together to provide you with the care you need, when you need it.  Your physician wants you to follow-up in: 6 months with  one of the following Advanced Practice Providers on your designated Care Team:    Ignacia Bayley, NP Christell Faith PA Cadence Kathlen Mody PA    You will receive a reminder letter in the mail two months in advance. If you don't receive a letter, please call our office to schedule the follow-up appointment.  Remote monitoring is used to monitor your ICD from home. This monitoring reduces the number of office visits required to check your device to one time per year. It allows [place] to keep an eye on the functioning of your device to ensure it is working properly. You are scheduled for a device check from home on 02/11/22. You may send your transmission at any time that day. If you have a wireless device, the transmission will be sent automatically. After your physician reviews  your transmission, you will receive a postcard with your next transmission date.  We recommend signing up for the patient portal called "MyChart".  Sign up information is provided on this After Visit Summary.  MyChart is used to connect with patients for Virtual Visits (Telemedicine).  Patients are able to view lab/test results, encounter notes, upcoming appointments, etc.  Non-urgent messages can be sent to your provider as well.   To learn more about what you can do with MyChart, go to [email or web address].    Any Other Special Instructions Will Be Listed Below (If Applicable).

## 2021-11-19 NOTE — Progress Notes (Signed)
Electrophysiology Office Follow up Visit Note:    Date:  11/19/2021   ID:  Garrett Hunter, DOB September 22, 1946, MRN [id number]  PCP:  Derinda Late, MD  [facility] Cardiologist:  None  [facility] Electrophysiologist:  [name], MD    Interval History:    Garrett Hunter is a 76 y.o. male who presents for a follow up visit. They were last seen in clinic August 20, 2021 for history of ventricular tachycardia.  He is maintained on amiodarone and mexiletine.  He has received appropriate ATP in the past for ventricular tachycardia.  We have previously discussed catheter ablation but if held off in favor of Amio/mexiletine therapy.       Past Medical History:  Diagnosis Date   AAA (abdominal aortic aneurysm)    thoracoabdominal aortic aneursym, s/p right ilio-femoral bypass 10/12/16; 3V FEVAR with SMA, RRA, and LRA stenting 11/16/16 by Dr. Sammuel Hines [facility])   Benign neoplasm of colon    patient not sure   CHF (congestive heart failure) (HCC)    EF 45-50%   Chronic airway obstruction, not elsewhere classified    pt denies   Coronary artery disease    Inferior MI in 1996 treated with TPA. Cardiac cath in 2011 at [facility] showed chronic occlusion of the proximal RCA and proximal left circumflex without obstructive disease in the LAD. Non-ST elevation myocardial infarction in October 2013 while on vacation in [place]. Cardiac catheterization showed plaque rupture in the proximal ramus with thrombus. He had balloon angioplasty done. 01/28/21 cath    Coronary atherosclerosis of native coronary artery    Hepatitis A    teenager, no longer a problem   Hyperlipidemia    Intolerance to statins.   Hypertension    MI (myocardial infarction) ([place])    x 3 - last one 2013   Renal artery stenosis (HCC)    s/p bilateral renal artery angioplasty 07/26/18   Stroke ([place]) 01/2021   Ventricular tachycardia    s/p cardioverson 01/27/21    Past Surgical History:  Procedure Laterality Date    ABDOMINAL AORTIC ANEURYSM REPAIR     11/16/2016   ANGIOPLASTY     CARDIAC CATHETERIZATION  2011   CARDIAC CATHETERIZATION  2/14   [facility]; 95% lesion in the ramus intermedius.    CATARACT EXTRACTION W/PHACO Left 12/19/2018   Procedure: CATARACT EXTRACTION PHACO AND INTRAOCULAR LENS PLACEMENT ([place])  LEFT;  Surgeon: Eulogio Bear, MD;  Location: [place];  Service: Ophthalmology;  Laterality: Left;   CATARACT EXTRACTION W/PHACO Right 03/06/2019   Procedure: CATARACT EXTRACTION PHACO AND INTRAOCULAR LENS PLACEMENT (IOC)RIGHT;  Surgeon: Eulogio Bear, MD;  Location: [place];  Service: Ophthalmology;  Laterality: Right;   COLONOSCOPY WITH PROPOFOL N/A 03/20/2021   Procedure: COLONOSCOPY WITH PROPOFOL;  Surgeon: Lin Landsman, MD;  Location[facility] ENDOSCOPY;  Service: Gastroenterology;  Laterality: N/A;   CORONARY ANGIOPLASTY  1996   [facility] x1   CORONARY ANGIOPLASTY  2/14   Severe restenosis in the ostial ramus. Status post angioplasty and drug-eluting stent placement with a 2.5 x 18 mm Xience drug-eluting stent   ESOPHAGOGASTRODUODENOSCOPY N/A 08/03/2018   Procedure: ESOPHAGOGASTRODUODENOSCOPY (EGD);  Surgeon: Lin Landsman, MD;  Location: [facility] ENDOSCOPY;  Service: Gastroenterology;  Laterality: N/A;   ESOPHAGOGASTRODUODENOSCOPY (EGD) WITH PROPOFOL N/A 11/03/2018   Procedure: ESOPHAGOGASTRODUODENOSCOPY (EGD) WITH PROPOFOL;  Surgeon: Lin Landsman, MD;  Location: [facility] ENDOSCOPY;  Service: Gastroenterology;  Laterality: N/A;   ESOPHAGOGASTRODUODENOSCOPY (EGD) WITH PROPOFOL N/A 12/06/2018  Procedure: ESOPHAGOGASTRODUODENOSCOPY (EGD) WITH PROPOFOL;  Surgeon: Lucilla Lame, MD;  Location[facility] ENDOSCOPY;  Service: Endoscopy;  Laterality: N/A;   ESOPHAGOGASTRODUODENOSCOPY (EGD) WITH PROPOFOL N/A 03/19/2021   Procedure: ESOPHAGOGASTRODUODENOSCOPY (EGD) WITH PROPOFOL;  Surgeon: Lin Landsman, MD;  Location: [facility] ENDOSCOPY;  Service: Gastroenterology;  Laterality:  N/A;   ICD IMPLANT N/A 05/14/2021   Procedure: ICD IMPLANT;  Surgeon: [name], MD;  Location: [place] CV LAB;  Service: Cardiovascular;  Laterality: N/A;   IR CT HEAD LTD  01/30/2021   IR PERCUTANEOUS ART THROMBECTOMY/INFUSION INTRACRANIAL INC DIAG ANGIO  01/30/2021   LEFT HEART CATH AND CORONARY ANGIOGRAPHY N/A 01/28/2021   Procedure: LEFT HEART CATH AND CORONARY ANGIOGRAPHY;  Surgeon: Nelva Bush, MD;  Location: [place] CV LAB;  Service: Cardiovascular;  Laterality: N/A;   RADIOLOGY WITH ANESTHESIA N/A 01/30/2021   Procedure: IR WITH ANESTHESIA;  Surgeon: Luanne Bras, MD;  Location: [place];  Service: Radiology;  Laterality: N/A;   TONSILLECTOMY AND ADENOIDECTOMY     TRANSCAROTID ARTERY REVASCULARIZATION  Left 02/07/2021   Procedure: TRANSCAROTID ARTERY REVASCULARIZATION LEFT;  Surgeon: [name], MD;  Location[facility] OR;  Service: Vascular;  Laterality: Left;    Current Medications: Current Meds  Medication Sig   amiodarone (PACERONE) 200 MG tablet Take 1 tablet (200 mg total) by mouth daily.   carvedilol (COREG) 12.5 MG tablet Take 1 tablet (12.5 mg total) by mouth 2 (two) times daily.   clopidogrel (PLAVIX) 75 MG tablet Take 75 mg by mouth daily.   Coenzyme Q10 (COQ10) 100 MG CAPS Take 1 capsule by mouth every evening.   FARXIGA 10 MG TABS tablet Take 1 tablet (10 mg total) by mouth daily.   mexiletine (MEXITIL) 150 MG capsule Take 1 capsule (150 mg total) by mouth 2 (two) times daily.   Multiple Vitamin (MULTIVITAMIN WITH MINERALS) TABS tablet Take 1 tablet by mouth daily.   pantoprazole (PROTONIX) 40 MG tablet Take 1 tablet (40 mg total) by mouth daily.   rosuvastatin (CRESTOR) 5 MG tablet Take 1 tablet (5 mg total) by mouth daily.   sacubitril-valsartan (ENTRESTO) 24-26 MG Take 1 tablet by mouth 2 (two) times daily.   vitamin C (ASCORBIC ACID) 500 MG tablet Take 500 mg by mouth daily.   [DISCONTINUED] amiodarone (PACERONE) 200 MG tablet Take 1 tablet  (200 mg total) by mouth 2 (two) times daily.     Allergies:   Crestor [rosuvastatin], Livalo [pitavastatin], Pravachol [pravastatin sodium], and Zocor [simvastatin]   Social History   Socioeconomic History   Marital status: Married    Spouse name: Not on file   Number of children: 4   Years of education: Not on file   Highest education level: Not on file  Occupational History   Not on file  Tobacco Use   Smoking status: Former    Packs/day: 2.00    Years: 40.00    Pack years: 80.00    Types: Cigarettes    Quit date: 2004    Years since quitting: 19.1   Smokeless tobacco: Former    Types: Nurse, children's Use: Never used  Substance and Sexual Activity   Alcohol use: Not Currently    Alcohol/week: 3.0 standard drinks    Types: 3 Cans of beer per week    Comment: occassionally   Drug use: No   Sexual activity: Yes  Other Topics Concern   Not on file  Social History Narrative   Lives at home with  wife, works   Oncologist: Not on [employer] Insecurity: Not on file  Transportation Needs: Not on file  Physical Activity: Not on file  Stress: Not on file  Social Connections: Not on file     Family History: The patient's family history includes Cancer (age of onset: 30) in his mother; Heart attack in his father; Hypertension in his brother and sister.  ROS:   Please see the history of present illness.    All other systems reviewed and are negative.  EKGs/Labs/Other Studies Reviewed:    The following studies were reviewed today:  November 19, 2021 in clinic device interrogation personally reviewed Lead parameter stable 0% pacing Most recent episode of ventricular tachycardia was September 13, 2021 which was successfully treated by 3 rounds of ATP  EKG:  The ekg ordered today demonstrates sinus rhythm  Recent Labs: 03/17/2021: Magnesium 2.0 03/20/2021: Hemoglobin 9.3; Platelets 223 11/19/2021: ALT 39; BUN  16; Creatinine, Ser 1.14; Potassium 4.1; Sodium 138; TSH 11.593  Recent Lipid Panel    Component Value Date/Time   CHOL 119 01/30/2021 0530   CHOL 121 09/13/2012 0819   TRIG 85 01/30/2021 0530   HDL 38 (L) 01/30/2021 0530   HDL 37 (L) 09/13/2012 0819   CHOLHDL 3.1 01/30/2021 0530   VLDL 17 01/30/2021 0530   LDLCALC 64 01/30/2021 0530   LDLCALC 68 09/13/2012 0819    Physical Exam:    VS:  BP 120/60 (BP Location: Left Arm, Patient Position: Sitting, Cuff Size: Normal)    Pulse 62    Ht _0  (1.803 m)    Wt 197 lb (89.4 kg)    SpO2 99%    BMI 27.48 kg/m     Wt Readings from Last 3 Encounters:  11/19/21 197 lb (89.4 kg)  08/20/21 200 lb (90.7 kg)  07/16/21 198 lb (89.8 kg)     GEN:  Well nourished, well developed in no acute distress HEENT: Normal NECK: No JVD; No carotid bruits LYMPHATICS: No lymphadenopathy CARDIAC: RRR, no murmurs, rubs, gallops.  ICD pocket well-healed RESPIRATORY:  Clear to auscultation without rales, wheezing or rhonchi  ABDOMEN: Soft, non-tender, non-distended MUSCULOSKELETAL:  No edema; No deformity  SKIN: Warm and dry NEUROLOGIC:  Alert and oriented x 3 PSYCHIATRIC:  Normal affect        ASSESSMENT:    1. Chronic systolic congestive heart failure ([place])   2. Ischemic cardiomyopathy   3. Paroxysmal VT   4. Encounter for long-term (current) use of high-risk medication   5. Medication management    PLAN:    In order of problems listed above:  #Chronic systolic heart failure #Ischemic cardiomyopathy NYHA class II.  Warm and dry on exam today.  On Coreg, Farxiga, Entresto.  #Ventricular tachycardia #ICD in situ Has a history of VT with most recent episode treated with ATP in December 2022.  Maintained on amiodarone and mexiletine.  We will decrease amiodarone to 200 mg by mouth once daily.  We will check CMP, TSH and free T4 today given chronic amiodarone use.  We will plan on seeing him back in 6 months or sooner as needed.  APP  okay.      Medication Adjustments/Labs and Tests Ordered: Current medicines are reviewed at length with the patient today.  Concerns regarding medicines are outlined above.  Orders Placed This Encounter  Procedures   TSH   Comp Met (CMET)   T4, free   EKG 12-Lead  Meds ordered this encounter  Medications   amiodarone (PACERONE) 200 MG tablet    Sig: Take 1 tablet (200 mg total) by mouth daily.    Dispense:  90 tablet    Refill:  3     Signed, Lars Mage, MD, [facility], [facility] 11/19/2021 9:09 PM    Electrophysiology [facility]

## 2021-11-19 NOTE — Therapy (Signed)
[place] MAIN [facility] SERVICES [place], [place], [address] Phone: [phone]   Fax:  [phone]  Speech Language Pathology Treatment Speech Therapy Progress Note   Dates of reporting period  10/08/2021   to   11/17/2021   Patient Details  Name: Garrett Hunter MRN: [id number] Date of Birth: 06/02/1946 Referring Provider (SLP): Derinda Late   Encounter Date: 11/17/2021   Speech Therapy Progress Note  Dates of Reporting Period: 10/08/2021 thru 11/17/2021  Objective: Patient has been seen for 10 speech therapy sessions this reporting period targeting aphasia and apraxia of speech. Patient is making progress toward LTGs and met 3/4 STGs this reporting period. See skilled intervention, clinical impressions, and goals below for details.    End of Session - 11/19/21 0710     Visit Number 10    Number of Visits 25    Date for SLP Re-Evaluation 01/01/22    Authorization Type Medicare    Authorization Time Period 10/08/2021 thru 01/01/2022    Authorization - Visit Number 10    Progress Note Due on Visit 10    SLP Start Time 61    SLP Stop Time  1500    SLP Time Calculation (min) 60 min    Activity Tolerance Patient tolerated treatment well             Past Medical History:  Diagnosis Date   AAA (abdominal aortic aneurysm)    thoracoabdominal aortic aneursym, s/p right ilio-femoral bypass 10/12/16; 3V FEVAR with SMA, RRA, and LRA stenting 11/16/16 by Dr. Sammuel H[facility])   Benign neoplasm of colon    patient not sure   CHF (congestive heart failure) (HCC)    EF 45-50%   Chronic airway obstruction, not elsewhere classified    pt denies   Coronary artery disease    Inferior MI in 1996 treated with TPA. Cardiac cath in 2011 at [facility] showed chronic occlusion of the proximal RCA and proximal left circumflex without obstructive disease in the LAD. Non-ST elevation myocardial infarction in October 2013 while on vacation in  [place]. Cardiac catheterization showed plaque rupture in the proximal ramus with thrombus. He had balloon angioplasty done. 01/28/21 cath    Coronary atherosclerosis of native coronary artery    Hepatitis A    teenager, no longer a problem   Hyperlipidemia    Intolerance to statins.   Hypertension    MI (myocardial infarction) ([place])    x 3 - last one 2013   Renal artery stenosis (HCC)    s/p bilateral renal artery angioplasty 07/26/18   Stroke ([place]) 01/2021   Ventricular tachycardia    s/p cardioverson 01/27/21    Past Surgical History:  Procedure Laterality Date   ABDOMINAL AORTIC ANEURYSM REPAIR     11/16/2016   ANGIOPLASTY     CARDIAC CATHETERIZATION  2011   CARDIAC CATHETERIZATION  2/14   [facility]; 95% lesion in the ramus intermedius.    CATARACT EXTRACTION W/PHACO Left 12/19/2018   Procedure: CATARACT EXTRACTION PHACO AND INTRAOCULAR LENS PLACEMENT ([place])  LEFT;  Surgeon: Eulogio Bear, MD;  Location: [place];  Service: Ophthalmology;  Laterality: Left;   CATARACT EXTRACTION W/PHACO Right 03/06/2019   Procedure: CATARACT EXTRACTION PHACO AND INTRAOCULAR LENS PLACEMENT (IOC)RIGHT;  Surgeon: Eulogio Bear, MD;  Location: [place];  Service: Ophthalmology;  Laterality: Right;   COLONOSCOPY WITH PROPOFOL N/A 03/20/2021   Procedure: COLONOSCOPY WITH PROPOFOL;  Surgeon: Lin Landsman, MD;  Location: [facility]  ENDOSCOPY;  Service: Gastroenterology;  Laterality: N/A;   CORONARY ANGIOPLASTY  1996   [facility] x1   CORONARY ANGIOPLASTY  2/14   Severe restenosis in the ostial ramus. Status post angioplasty and drug-eluting stent placement with a 2.5 x 18 mm Xience drug-eluting stent   ESOPHAGOGASTRODUODENOSCOPY N/A 08/03/2018   Procedure: ESOPHAGOGASTRODUODENOSCOPY (EGD);  Surgeon: Lin Landsman, MD;  Location: [facility] ENDOSCOPY;  Service: Gastroenterology;  Laterality: N/A;   ESOPHAGOGASTRODUODENOSCOPY (EGD) WITH PROPOFOL N/A 11/03/2018   Procedure:  ESOPHAGOGASTRODUODENOSCOPY (EGD) WITH PROPOFOL;  Surgeon: Lin Landsman, MD;  Location: [facility] ENDOSCOPY;  Service: Gastroenterology;  Laterality: N/A;   ESOPHAGOGASTRODUODENOSCOPY (EGD) WITH PROPOFOL N/A 12/06/2018   Procedure: ESOPHAGOGASTRODUODENOSCOPY (EGD) WITH PROPOFOL;  Surgeon: Lucilla Lame, MD;  Location: [facility] ENDOSCOPY;  Service: Endoscopy;  Laterality: N/A;   ESOPHAGOGASTRODUODENOSCOPY (EGD) WITH PROPOFOL N/A 03/19/2021   Procedure: ESOPHAGOGASTRODUODENOSCOPY (EGD) WITH PROPOFOL;  Surgeon: Lin Landsman, MD;  Location: [facility] ENDOSCOPY;  Service: Gastroenterology;  Laterality: N/A;   ICD IMPLANT N/A 05/14/2021   Procedure: ICD IMPLANT;  Surgeon: [name], MD;  Location: [place] CV LAB;  Service: Cardiovascular;  Laterality: N/A;   IR CT HEAD LTD  01/30/2021   IR PERCUTANEOUS ART THROMBECTOMY/INFUSION INTRACRANIAL INC DIAG ANGIO  01/30/2021   LEFT HEART CATH AND CORONARY ANGIOGRAPHY N/A 01/28/2021   Procedure: LEFT HEART CATH AND CORONARY ANGIOGRAPHY;  Surgeon: Nelva Bush, MD;  Location: [place] CV LAB;  Service: Cardiovascular;  Laterality: N/A;   RADIOLOGY WITH ANESTHESIA N/A 01/30/2021   Procedure: IR WITH ANESTHESIA;  Surgeon: Luanne Bras, MD;  Location: [place];  Service: Radiology;  Laterality: N/A;   TONSILLECTOMY AND ADENOIDECTOMY     TRANSCAROTID ARTERY REVASCULARIZATION  Left 02/07/2021   Procedure: TRANSCAROTID ARTERY REVASCULARIZATION LEFT;  Surgeon: [name], MD;  Location: [place];  Service: Vascular;  Laterality: Left;    There were no vitals filed for this visit.   Subjective Assessment - 11/19/21 0706     Subjective pt enjoyed talking about his granddaughter's birthday party this weekend    Currently in Pain? No/denies                   ADULT SLP TREATMENT - 11/19/21 0001       Treatment Provided   Treatment provided Cognitive-Linquistic      Cognitive-Linquistic Treatment   Treatment focused on  Aphasia;Apraxia;Patient/family/caregiver education    Skilled Treatment Skilled tratment session focused on using compensatory strategies to improve pt's expressive abilities. SLP instructed pt in using written prompts to help his listeners. Since pt doesn't usually carry a note pad with him, SLP downloaded a note app on pt's phone. Pt able to locate and demonstrate use of app to type various words. Pt eager to try using in moments of word finding difficulty and apraxia.              SLP Education - 11/19/21 0710     Education Details use writting to supplement spoken language    Person(s) Educated Patient    Methods Explanation;Demonstration;Verbal cues    Comprehension Verbalized understanding;Returned demonstration;Need further instruction              SLP Short Term Goals - 11/19/21 0723       SLP SHORT TERM GOAL #1   Title Pt will produce sentences in response to a question (e.g. whats your favorite holiday and why) with appropriate articulation at 75% accuracy given frequent moderate phonemic placement cues.    Baseline goal  met, revised to reflection progress    Time 10    Period --   sessions   Status Revised      SLP SHORT TERM GOAL #2   Title Pt will expressively produce 4+ word sentence in response to situation/picture with 50% accuracy given moderate cues in order to increase ability to communicate basic wants needs.    Time --   10   Period --   sessions   Status On-going      SLP SHORT TERM GOAL #3   Title Pt will write grammatically correct 5 word sentence about a basic picture with minimal assistance.    Baseline goal met, revised to reflect progress    Time 10    Period --   sessions   Status Revised      SLP SHORT TERM GOAL #4   Title Pt will initiate use of compensatory strategy to repair moments of communication breakdown 2 times in a session.    Baseline new goal    Time 10    Period --   sessions   Status New              SLP Long Term  Goals - 11/19/21 0725       SLP LONG TERM GOAL #1   Title Pt will use functional communication skills for social interaction (e.g., greetings,  social etiquette, and short questions/simple sentences) with both familiar and unfamiliar  partners with 90% success.    Target Date 01/01/22              Plan - 11/19/21 0720     Clinical Impression Statement Pt eager to use writing as compenstory strategy to help in moments of communication breakdwon. Pt is demonstrating improvement in word finding as well as attempts to slow rate of speech. Despite this, pt continues to have moderate communication impairment is unable to be functional independent with communication. Continue to recommend skilled ST intervention to improve pt's functional communication.    Speech Therapy Frequency 2x / week    Duration 12 weeks    Treatment/Interventions SLP instruction and feedback;Patient/family education;Language facilitation    Potential to Achieve Goals Good    SLP Home Exercise Plan provided, see pt instructions section    Consulted and Agree with Plan of Care Patient             Patient will benefit from skilled therapeutic intervention in order to improve the following deficits and impairments:   Aphasia  Apraxia  Cerebral infarction due to embolism of left middle cerebral artery [facility])    Problem List Patient Active Problem List   Diagnosis Date Noted   Aortic aneurysm ([place]) 05/29/2021   ICD (implantable cardioverter-defibrillator) in place 05/14/2021   Ischemic cardiomyopathy 05/14/2021   NSVT (nonsustained ventricular tachycardia)    Melena 03/17/2021   Upper GI bleeding 03/16/2021   CKD (chronic kidney disease) stage 3, GFR 30-59 ml/min (HCC) 03/16/2021   Hypokalemia 03/16/2021   Carotid stenosis 02/07/2021   Cerebral thrombosis with cerebral infarction 01/30/2021   Cerebral embolism with cerebral infarction 01/30/2021   AKI (acute kidney injury) ([place])    Sustained VT  (ventricular tachycardia) 01/27/2021   Peptic ulcer disease    Duodenal ulcer disease    GIB (gastrointestinal bleeding) 08/02/2018   Diabetes mellitus without complication ([place]) 78/58/8502   Erectile dysfunction 07/01/2015   Primary hypertension 08/09/2014   Pure hypercholesterolemia 77/41/2878   Chronic systolic heart failure ([place]) 08/04/2012   Coronary artery  disease    Hyperlipidemia    AAA (abdominal aortic aneurysm)    NSTEMI (non-ST elevated myocardial infarction) ([place]) 07/09/2012   Hyperglycemia 07/08/2012   Larinda Herter B. Rutherford Nail M.S., CCC-SLP, [facility] Cl[facility] Pathologist Rehabilitation Services Office [phone], [place] 11/19/2021, 7:26 AM  [place] MAIN [facility] SERVICES [address] [place], [place], [address] Phone: [phone]   Fax:  [phone]   Name: Garrett Hunter MRN: [id number] Date of Birth: [birth year]

## 2021-11-20 ENCOUNTER — Ambulatory Visit: Payer: Medicare Other | Admitting: Speech Pathology

## 2021-11-20 ENCOUNTER — Other Ambulatory Visit: Payer: Self-pay | Admitting: Cardiovascular Disease

## 2021-11-20 DIAGNOSIS — R4701 Aphasia: Secondary | ICD-10-CM | POA: Diagnosis not present

## 2021-11-20 DIAGNOSIS — R482 Apraxia: Secondary | ICD-10-CM

## 2021-11-20 DIAGNOSIS — I63412 Cerebral infarction due to embolism of left middle cerebral artery: Secondary | ICD-10-CM

## 2021-11-22 NOTE — Therapy (Signed)
[place] MAIN [facility] SERVICES [address] [place], [place], [address] Phone: [phone]   Fax:  [phone]  Speech Language Pathology Treatment  Patient Details  Name: Garrett Hunter MRN: [id number] Date of Birth: Mar 27, 1946 Referring Provider (SLP): Derinda Late   Encounter Date: 11/20/2021   End of Session - 11/22/21 1054     Visit Number 11    Number of Visits 25    Date for SLP Re-Evaluation 01/01/22    Authorization Type Medicare    Authorization Time Period 10/08/2021 thru 01/01/2022    Authorization - Visit Number 1    Progress Note Due on Visit 10    SLP Start Time 51    SLP Stop Time  1400    SLP Time Calculation (min) 60 min    Activity Tolerance Patient tolerated treatment well             Past Medical History:  Diagnosis Date   AAA (abdominal aortic aneurysm)    thoracoabdominal aortic aneursym, s/p right ilio-femoral bypass 10/12/16; 3V FEVAR with SMA, RRA, and LRA stenting 11/16/16 by Dr. Sammuel Hines [facility])   Benign neoplasm of colon    patient not sure   CHF (congestive heart failure) (HCC)    EF 45-50%   Chronic airway obstruction, not elsewhere classified    pt denies   Coronary artery disease    Inferior MI in 1996 treated with TPA. Cardiac cath in 2011 at [facility] showed chronic occlusion of the proximal RCA and proximal left circumflex without obstructive disease in the LAD. Non-ST elevation myocardial infarction in October 2013 while on vacation in [place]. Cardiac catheterization showed plaque rupture in the proximal ramus with thrombus. He had balloon angioplasty done. 01/28/21 cath    Coronary atherosclerosis of native coronary artery    Hepatitis A    teenager, no longer a problem   Hyperlipidemia    Intolerance to statins.   Hypertension    MI (myocardial infarction) ([place])    x 3 - last one 2013   Renal artery stenosis (HCC)    s/p bilateral renal artery angioplasty 07/26/18   Stroke ([place]) 01/2021    Ventricular tachycardia    s/p cardioverson 01/27/21    Past Surgical History:  Procedure Laterality Date   ABDOMINAL AORTIC ANEURYSM REPAIR     11/16/2016   ANGIOPLASTY     CARDIAC CATHETERIZATION  2011   CARDIAC CATHETERIZATION  2/14   [facility]; 95% lesion in the ramus intermedius.    CATARACT EXTRACTION W/PHACO Left 12/19/2018   Procedure: CATARACT EXTRACTION PHACO AND INTRAOCULAR LENS PLACEMENT ([place])  LEFT;  Surgeon: Eulogio Bear, MD;  Location: [place];  Service: Ophthalmology;  Laterality: Left;   CATARACT EXTRACTION W/PHACO Right 03/06/2019   Procedure: CATARACT EXTRACTION PHACO AND INTRAOCULAR LENS PLACEMENT (IOC)RIGHT;  Surgeon: Eulogio Bear, MD;  Location: [place];  Service: Ophthalmology;  Laterality: Right;   COLONOSCOPY WITH PROPOFOL N/A 03/20/2021   Procedure: COLONOSCOPY WITH PROPOFOL;  Surgeon: Lin Landsman, MD;  Location: [facility] ENDOSCOPY;  Service: Gastroenterology;  Laterality: N/A;   CORONARY ANGIOPLASTY  1996   [facility] x1   CORONARY ANGIOPLASTY  2/14   Severe restenosis in the ostial ramus. Status post angioplasty and drug-eluting stent placement with a 2.5 x 18 mm Xience drug-eluting stent   ESOPHAGOGASTRODUODENOSCOPY N/A 08/03/2018   Procedure: ESOPHAGOGASTRODUODENOSCOPY (EGD);  Surgeon: Lin Landsman, MD;  Location: [facility] ENDOSCOPY;  Service: Gastroenterology;  Laterality: N/A;   ESOPHAGOGASTRODUODENOSCOPY (EGD)  WITH PROPOFOL N/A 11/03/2018   Procedure: ESOPHAGOGASTRODUODENOSCOPY (EGD) WITH PROPOFOL;  Surgeon: Lin Landsman, MD;  Location: [facility] ENDOSCOPY;  Service: Gastroenterology;  Laterality: N/A;   ESOPHAGOGASTRODUODENOSCOPY (EGD) WITH PROPOFOL N/A 12/06/2018   Procedure: ESOPHAGOGASTRODUODENOSCOPY (EGD) WITH PROPOFOL;  Surgeon: Lucilla Lame, MD;  Location: [facility] ENDOSCOPY;  Service: Endoscopy;  Laterality: N/A;   ESOPHAGOGASTRODUODENOSCOPY (EGD) WITH PROPOFOL N/A 03/19/2021   Procedure: ESOPHAGOGASTRODUODENOSCOPY (EGD) WITH  PROPOFOL;  Surgeon: Lin Landsman, MD;  Location: [facility] ENDOSCOPY;  Service: Gastroenterology;  Laterality: N/A;   ICD IMPLANT N/A 05/14/2021   Procedure: ICD IMPLANT;  Surgeon: [name], MD;  Location: [place] CV LAB;  Service: Cardiovascular;  Laterality: N/A;   IR CT HEAD LTD  01/30/2021   IR PERCUTANEOUS ART THROMBECTOMY/INFUSION INTRACRANIAL INC DIAG ANGIO  01/30/2021   LEFT HEART CATH AND CORONARY ANGIOGRAPHY N/A 01/28/2021   Procedure: LEFT HEART CATH AND CORONARY ANGIOGRAPHY;  Surgeon: Nelva Bush, MD;  Location: [place] CV LAB;  Service: Cardiovascular;  Laterality: N/A;   RADIOLOGY WITH ANESTHESIA N/A 01/30/2021   Procedure: IR WITH ANESTHESIA;  Surgeon: Luanne Bras, MD;  Location: [place];  Service: Radiology;  Laterality: N/A;   TONSILLECTOMY AND ADENOIDECTOMY     TRANSCAROTID ARTERY REVASCULARIZATION  Left 02/07/2021   Procedure: TRANSCAROTID ARTERY REVASCULARIZATION LEFT;  Surgeon: [name], MD;  Location: [place];  Service: Vascular;  Laterality: Left;    There were no vitals filed for this visit.   Subjective Assessment - 11/22/21 1049     Subjective pt pleasant, motivated, eager    Currently in Pain? No/denies                   ADULT SLP TREATMENT - 11/22/21 0001       Treatment Provided   Treatment provided Cognitive-Linquistic      Cognitive-Linquistic Treatment   Treatment focused on Aphasia;Apraxia;Patient/family/caregiver education    Skilled Treatment Skilled treatment focused on pt's dysfluent speech d/t apraxia of speech. SLP introduced compensatory strategy of PACING using visual aid of dots to touch for each word and syllable. Pt required maximal multimodal assistance d/t continued fast rate. More specifically, pt struggled with slowing multi syllabic words giving each syllable a tap. When using pacing, pt's speech intelligibility was greatly improved when reading list of 30 functional phrases to ~ 90%. An  additional list of functional multi-syllabic words were created pertaining to pt's family. He began demonstrating some independent ability using pacing for each syllable. SLP used pt's phone to record SLP using pacing with list of functional phrases as well as multi-syllabic words.              SLP Education - 11/22/21 1053     Education Details motor movement and pacing    Person(s) Educated Patient    Methods Explanation;Demonstration;Tactile cues;Verbal cues;Handout    Comprehension Verbalized understanding;Returned demonstration;Need further instruction              SLP Short Term Goals - 11/19/21 0723       SLP SHORT TERM GOAL #1   Title Pt will produce sentences in response to a question (e.g. whats your favorite holiday and why) with appropriate articulation at 75% accuracy given frequent moderate phonemic placement cues.    Baseline goal met, revised to reflection progress    Time 10    Period --   sessions   Status Revised      SLP SHORT TERM GOAL #2   Title Pt will expressively  produce 4+ word sentence in response to situation/picture with 50% accuracy given moderate cues in order to increase ability to communicate basic wants needs.    Time --   10   Period --   sessions   Status On-going      SLP SHORT TERM GOAL #3   Title Pt will write grammatically correct 5 word sentence about a basic picture with minimal assistance.    Baseline goal met, revised to reflect progress    Time 10    Period --   sessions   Status Revised      SLP SHORT TERM GOAL #4   Title Pt will initiate use of compensatory strategy to repair moments of communication breakdown 2 times in a session.    Baseline new goal    Time 10    Period --   sessions   Status New              SLP Long Term Goals - 11/19/21 0725       SLP LONG TERM GOAL #1   Title Pt will use functional communication skills for social interaction (e.g., greetings,  social etiquette, and short  questions/simple sentences) with both familiar and unfamiliar  partners with 90% success.    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 As a result, pt's speech is significantly disfluent and difficult to comprehend. Pt's speech intellibibility was greatly improved when pacing his words/syllables. However, he requires total multimodal assistance to use. As such, skilled ST intervention continues to be required to improve pt's functional communication.    Speech Therapy Frequency 2x / week    Duration 12 weeks    Treatment/Interventions SLP instruction and feedback;Patient/family education;Language facilitation    Potential to Achieve Goals Good    SLP Home Exercise Plan provided, see pt instructions section    Consulted and Agree with Plan of Care Patient             Patient will benefit from skilled therapeutic intervention in order to improve the following deficits and impairments:   Aphasia  Apraxia  Cerebral infarction due to embolism of left middle cerebral artery [facility])    Problem List Patient Active Problem List   Diagnosis Date Noted   Aortic aneurysm ([place]) 05/29/2021   ICD (implantable cardioverter-defibrillator) in place 05/14/2021   Ischemic cardiomyopathy 05/14/2021   NSVT (nonsustained ventricular tachycardia)    Melena 03/17/2021   Upper GI bleeding 03/16/2021   CKD (chronic kidney disease) stage 3, GFR 30-59 ml/min (HCC) 03/16/2021   Hypokalemia 03/16/2021   Carotid stenosis 02/07/2021   Cerebral thrombosis with cerebral infarction 01/30/2021   Cerebral embolism with cerebral infarction 01/30/2021   AKI (acute kidney injury) ([place])    Sustained VT (ventricular tachycardia) 01/27/2021   Peptic ulcer disease    Duodenal ulcer disease    GIB (gastrointestinal bleeding) 08/02/2018   Diabetes  mellitus without complication ([place]) 66/03/3015   Erectile dysfunction 07/01/2015   Primary hypertension 08/09/2014   Pure hypercholesterolemia 10/13/3233   Chronic systolic heart failure ([place]) 08/04/2012   Coronary artery disease    Hyperlipidemia    AAA (abdominal aortic aneurysm)    NSTEMI (non-ST elevated myocardial infarction) ([place]) 07/09/2012   Hyperglycemia 07/08/2012   Yue Flanigan B. Rutherford Nail, M.S., CCC-SLP,  [facility] Pathologist Rehabilitation Services Office [phone]  Stormy Fabian, [place] 11/22/2021, 10:59 AM  [place] [address] [place], [place], [address] Phone: [phone]   Fax:  [phone]   Name: Garrett Hunter MRN: [id number] Date of Birth: Nov 29, 1945

## 2021-11-22 NOTE — Patient Instructions (Signed)
Use SLP recording to practice pacing to slow rate of speech and improve speech intelligibility

## 2021-11-24 ENCOUNTER — Ambulatory Visit: Payer: Medicare Other | Admitting: Neurology

## 2021-11-25 ENCOUNTER — Ambulatory Visit: Payer: Medicare Other | Admitting: Speech Pathology

## 2021-11-27 ENCOUNTER — Other Ambulatory Visit: Payer: Self-pay

## 2021-11-27 ENCOUNTER — Ambulatory Visit: Payer: Medicare Other | Admitting: Speech Pathology

## 2021-11-27 DIAGNOSIS — I63412 Cerebral infarction due to embolism of left middle cerebral artery: Secondary | ICD-10-CM

## 2021-11-27 DIAGNOSIS — R4701 Aphasia: Secondary | ICD-10-CM | POA: Diagnosis not present

## 2021-11-27 DIAGNOSIS — R482 Apraxia: Secondary | ICD-10-CM

## 2021-11-27 NOTE — Patient Instructions (Signed)
Use pacing board during simple conversation such as during supper with basic Q&A about the food/meal

## 2021-11-27 NOTE — Therapy (Signed)
[place] MAIN [facility] SERVICES [address] [place], [place], [address] Phone: [phone]   Fax:  [phone]  Speech Language Pathology Treatment  Patient Details  Name: Garrett Hunter MRN: [id number] Date of Birth: 05/23/1946 Referring Provider (SLP): Derinda Late   Encounter Date: 11/27/2021   End of Session - 11/27/21 2253     Visit Number 12    Number of Visits 25    Date for SLP Re-Evaluation 01/01/22    Authorization Type Medicare    Authorization Time Period 10/08/2021 thru 01/01/2022    Authorization - Visit Number 2    Progress Note Due on Visit 10    SLP Start Time 51    SLP Stop Time  1400    SLP Time Calculation (min) 60 min    Activity Tolerance Patient tolerated treatment well             Past Medical History:  Diagnosis Date   AAA (abdominal aortic aneurysm)    thoracoabdominal aortic aneursym, s/p right ilio-femoral bypass 10/12/16; 3V FEVAR with SMA, RRA, and LRA stenting 11/16/16 by Dr. Sammuel Hines [facility])   Benign neoplasm of colon    patient not sure   CHF (congestive heart failure) (HCC)    EF 45-50%   Chronic airway obstruction, not elsewhere classified    pt denies   Coronary artery disease    Inferior MI in 1996 treated with TPA. Cardiac cath in 2011 at [facility] showed chronic occlusion of the proximal RCA and proximal left circumflex without obstructive disease in the LAD. Non-ST elevation myocardial infarction in October 2013 while on vacation in [place]. Cardiac catheterization showed plaque rupture in the proximal ramus with thrombus. He had balloon angioplasty done. 01/28/21 cath    Coronary atherosclerosis of native coronary artery    Hepatitis A    teenager, no longer a problem   Hyperlipidemia    Intolerance to statins.   Hypertension    MI (myocardial infarction) ([place])    x 3 - last one 2013   Renal artery stenosis (HCC)    s/p bilateral renal artery angioplasty 07/26/18   Stroke ([place]) 01/2021    Ventricular tachycardia    s/p cardioverson 01/27/21    Past Surgical History:  Procedure Laterality Date   ABDOMINAL AORTIC ANEURYSM REPAIR     11/16/2016   ANGIOPLASTY     CARDIAC CATHETERIZATION  2011   CARDIAC CATHETERIZATION  2/14   [facility]; 95% lesion in the ramus intermedius.    CATARACT EXTRACTION W/PHACO Left 12/19/2018   Procedure: CATARACT EXTRACTION PHACO AND INTRAOCULAR LENS PLACEMENT ([place])  LEFT;  Surgeon: Eulogio Bear, MD;  Location: [place];  Service: Ophthalmology;  Laterality: Left;   CATARACT EXTRACTION W/PHACO Right 03/06/2019   Procedure: CATARACT EXTRACTION PHACO AND INTRAOCULAR LENS PLACEMENT (IOC)RIGHT;  Surgeon: Eulogio Bear, MD;  Location: [place];  Service: Ophthalmology;  Laterality: Right;   COLONOSCOPY WITH PROPOFOL N/A 03/20/2021   Procedure: COLONOSCOPY WITH PROPOFOL;  Surgeon: Lin Landsman, MD;  Location: [facility] ENDOSCOPY;  Service: Gastroenterology;  Laterality: N/A;   CORONARY ANGIOPLASTY  1996   [facility] x1   CORONARY ANGIOPLASTY  2/14   Severe restenosis in the ostial ramus. Status post angioplasty and drug-eluting stent placement with a 2.5 x 18 mm Xience drug-eluting stent   ESOPHAGOGASTRODUODENOSCOPY N/A 08/03/2018   Procedure: ESOPHAGOGASTRODUODENOSCOPY (EGD);  Surgeon: Lin Landsman, MD;  Location: [facility] ENDOSCOPY;  Service: Gastroenterology;  Laterality: N/A;   ESOPHAGOGASTRODUODENOSCOPY (EGD)  WITH PROPOFOL N/A 11/03/2018   Procedure: ESOPHAGOGASTRODUODENOSCOPY (EGD) WITH PROPOFOL;  Surgeon: Toney Reil, MD;  Location: [facility] ENDOSCOPY;  Service: Gastroenterology;  Laterality: N/A;   ESOPHAGOGASTRODUODENOSCOPY (EGD) WITH PROPOFOL N/A 12/06/2018   Procedure: ESOPHAGOGASTRODUODENOSCOPY (EGD) WITH PROPOFOL;  Surgeon: Midge Minium, MD;  Location: [facility] ENDOSCOPY;  Service: Endoscopy;  Laterality: N/A;   ESOPHAGOGASTRODUODENOSCOPY (EGD) WITH PROPOFOL N/A 03/19/2021   Procedure: ESOPHAGOGASTRODUODENOSCOPY (EGD) WITH  PROPOFOL;  Surgeon: Toney Reil, MD;  Location: [facility] ENDOSCOPY;  Service: Gastroenterology;  Laterality: N/A;   ICD IMPLANT N/A 05/14/2021   Procedure: ICD IMPLANT;  Surgeon: Lanier Prude, MD;  Location: [facility] INVASIVE CV LAB;  Service: Cardiovascular;  Laterality: N/A;   IR CT HEAD LTD  01/30/2021   IR PERCUTANEOUS ART THROMBECTOMY/INFUSION INTRACRANIAL INC DIAG ANGIO  01/30/2021   LEFT HEART CATH AND CORONARY ANGIOGRAPHY N/A 01/28/2021   Procedure: LEFT HEART CATH AND CORONARY ANGIOGRAPHY;  Surgeon: Yvonne Kendall, MD;  Location: [facility] INVASIVE CV LAB;  Service: Cardiovascular;  Laterality: N/A;   RADIOLOGY WITH ANESTHESIA N/A 01/30/2021   Procedure: IR WITH ANESTHESIA;  Surgeon: Julieanne Cotton, MD;  Location: [facility] OR;  Service: Radiology;  Laterality: N/A;   TONSILLECTOMY AND ADENOIDECTOMY     TRANSCAROTID ARTERY REVASCULARIZATION  Left 02/07/2021   Procedure: TRANSCAROTID ARTERY REVASCULARIZATION LEFT;  Surgeon: Nada Libman, MD;  [facility] OR;  Service: Vascular;  Laterality: Left;    There were no vitals filed for this visit.   Subjective Assessment - 11/27/21 2249     Subjective "I had a problem at the shop on Tuesday" referring to missed session    Currently in Pain? No/denies                   ADULT SLP TREATMENT - 11/27/21 0001       Treatment Provided   Treatment provided Cognitive-Linquistic      Cognitive-Linquistic Treatment   Treatment focused on Aphasia;Apraxia;Patient/family/caregiver education    Skilled Treatment Skilled treatment session focused on using pacing to slow rate of speech and increase speech intelligibility. Pt required maximal cues to slow rate of speech and accurately engage in pacing. Pt continues to require extensive instruction/education in performing functional drills to promote correct motor movements. SLP further faciltiated session by providing moderate assistance and model to use pacing to answer questions regarding  the recent problem at his wife's shop.              SLP Education - 11/27/21 2253     Education Details promoting/reinforcing accurate motor movements    Person(s) Educated Patient    Methods Explanation;Demonstration;Tactile cues;Verbal cues;Handout    Comprehension Verbalized understanding;Need further instruction              SLP Short Term Goals - 11/19/21 0723       SLP SHORT TERM GOAL #1   Title Pt will produce sentences in response to a question (e.g. whats your favorite holiday and why) with appropriate articulation at 75% accuracy given frequent moderate phonemic placement cues.    Baseline goal met, revised to reflection progress    Time 10    Period --   sessions   Status Revised      SLP SHORT TERM GOAL #2   Title Pt will expressively produce 4+ word sentence in response to situation/picture with 50% accuracy given moderate cues in order to increase ability to communicate basic wants needs.    Time --   10   Period --  sessions   Status On-going      SLP SHORT TERM GOAL #3   Title Pt will write grammatically correct 5 word sentence about a basic picture with minimal assistance.    Baseline goal met, revised to reflect progress    Time 10    Period --   sessions   Status Revised      SLP SHORT TERM GOAL #4   Title Pt will initiate use of compensatory strategy to repair moments of communication breakdown 2 times in a session.    Baseline new goal    Time 10    Period --   sessions   Status New              SLP Long Term Goals - 11/19/21 0725       SLP LONG TERM GOAL #1   Title Pt will use functional communication skills for social interaction (e.g., greetings,  social etiquette, and short questions/simple sentences) with both familiar and unfamiliar  partners with 90% success.    Target Date 01/01/22              Plan - 11/27/21 2254     Clinical Impression Statement Pt has significant struggle with using pacing board to slow rate of  speech to produce accurate motor movements. As such, pt's spontaneous speech is dyslfuent with word finding deficits. Skilled sT intervention continues to be required to target these deficits and improve pt's functional communication.    Speech Therapy Frequency 2x / week    Duration 12 weeks    Treatment/Interventions SLP instruction and feedback;Patient/family education;Language facilitation    Potential to Achieve Goals Good    SLP Home Exercise Plan provided, see pt instructions section    Consulted and Agree with Plan of Care Patient             Patient will benefit from skilled therapeutic intervention in order to improve the following deficits and impairments:   Apraxia  Aphasia  Cerebral infarction due to embolism of left middle cerebral artery [facility])    Problem List Patient Active Problem List   Diagnosis Date Noted   Aortic aneurysm ([place]) 05/29/2021   ICD (implantable cardioverter-defibrillator) in place 05/14/2021   Ischemic cardiomyopathy 05/14/2021   NSVT (nonsustained ventricular tachycardia)    Melena 03/17/2021   Upper GI bleeding 03/16/2021   CKD (chronic kidney disease) stage 3, GFR 30-59 ml/min (HCC) 03/16/2021   Hypokalemia 03/16/2021   Carotid stenosis 02/07/2021   Cerebral thrombosis with cerebral infarction 01/30/2021   Cerebral embolism with cerebral infarction 01/30/2021   AKI (acute kidney injury) ([place])    Sustained VT (ventricular tachycardia) 01/27/2021   Peptic ulcer disease    Duodenal ulcer disease    GIB (gastrointestinal bleeding) 08/02/2018   Diabetes mellitus without complication ([place]) 31/49/7026   Erectile dysfunction 07/01/2015   Primary hypertension 08/09/2014   Pure hypercholesterolemia 37/85/8850   Chronic systolic heart failure ([place]) 08/04/2012   Coronary artery disease    Hyperlipidemia    AAA (abdominal aortic aneurysm)    NSTEMI (non-ST elevated myocardial infarction) ([place]) 07/09/2012   Hyperglycemia 07/08/2012   Rafeef Lau B.  Rutherford Nail M.S., CCC-SLP, [facility] Pathologist Rehabilitation Services Office [phone]  Stormy Fabian, [place] 11/27/2021, 10:56 PM  [place] MAIN [facility] SERVICES [address] [place], [place], [address] Phone: [phone]   Fax:  [phone]   Name: SEVERUS BRODZINSKI MRN: [id number] Date of Birth: 1946-01-07

## 2021-12-02 ENCOUNTER — Ambulatory Visit: Payer: Medicare Other | Admitting: Speech Pathology

## 2021-12-02 ENCOUNTER — Other Ambulatory Visit: Payer: Self-pay

## 2021-12-02 DIAGNOSIS — R482 Apraxia: Secondary | ICD-10-CM

## 2021-12-02 DIAGNOSIS — R4701 Aphasia: Secondary | ICD-10-CM

## 2021-12-02 DIAGNOSIS — I63412 Cerebral infarction due to embolism of left middle cerebral artery: Secondary | ICD-10-CM

## 2021-12-02 NOTE — Therapy (Addendum)
[place] MAIN [facility] SERVICES [address] [place], [place], [address] Phone: [phone]   Fax:  [phone]  Speech Language Pathology Treatment  Patient Details  Name: Garrett Hunter MRN: [id number] Date of Birth: [birth year] Referring Provider (SLP): Derinda Late   Encounter Date: 12/02/2021   End of Session - 12/02/21 1721     Visit Number 13    Number of Visits 25    Date for SLP Re-Evaluation 01/01/22    Authorization Type Medicare    Authorization Time Period 10/08/2021 thru 01/01/2022    Authorization - Visit Number 3    Progress Note Due on Visit 10    SLP Start Time 57    SLP Stop Time  1400    SLP Time Calculation (min) 60 min    Activity Tolerance Patient tolerated treatment well             Past Medical History:  Diagnosis Date   AAA (abdominal aortic aneurysm)    thoracoabdominal aortic aneursym, s/p right ilio-femoral bypass 10/12/16; 3V FEVAR with SMA, RRA, and LRA stenting 11/16/16 by Dr. Sammuel Hines [facility])   Benign neoplasm of colon    patient not sure   CHF (congestive heart failure) (HCC)    EF 45-50%   Chronic airway obstruction, not elsewhere classified    pt denies   Coronary artery disease    Inferior MI in 1996 treated with TPA. Cardiac cath in 2011 at [facility] showed chronic occlusion of the proximal RCA and proximal left circumflex without obstructive disease in the LAD. Non-ST elevation myocardial infarction in October 2013 while on vacation in [place]. Cardiac catheterization showed plaque rupture in the proximal ramus with thrombus. He had balloon angioplasty done. 01/28/21 cath    Coronary atherosclerosis of native coronary artery    Hepatitis A    teenager, no longer a problem   Hyperlipidemia    Intolerance to statins.   Hypertension    MI (myocardial infarction) ([place])    x 3 - last one 2013   Renal artery stenosis (HCC)    s/p bilateral renal artery angioplasty 07/26/18   Stroke ([place]) 01/2021    Ventricular tachycardia    s/p cardioverson 01/27/21    Past Surgical History:  Procedure Laterality Date   ABDOMINAL AORTIC ANEURYSM REPAIR     11/16/2016   ANGIOPLASTY     CARDIAC CATHETERIZATION  2011   CARDIAC CATHETERIZATION  2/14   [facility]; 95% lesion in the ramus intermedius.    CATARACT EXTRACTION W/PHACO Left 12/19/2018   Procedure: CATARACT EXTRACTION PHACO AND INTRAOCULAR LENS PLACEMENT ([place])  LEFT;  Surgeon: Eulogio Bear, MD;  Location: [place];  Service: Ophthalmology;  Laterality: Left;   CATARACT EXTRACTION W/PHACO Right 03/06/2019   Procedure: CATARACT EXTRACTION PHACO AND INTRAOCULAR LENS PLACEMENT (IOC)RIGHT;  Surgeon: Eulogio Bear, MD;  Location: [place];  Service: Ophthalmology;  Laterality: Right;   COLONOSCOPY WITH PROPOFOL N/A 03/20/2021   Procedure: COLONOSCOPY WITH PROPOFOL;  Surgeon: Lin Landsman, MD;  Location: [facility] ENDOSCOPY;  Service: Gastroenterology;  Laterality: N/A;   CORONARY ANGIOPLASTY  1996   [facility] x1   CORONARY ANGIOPLASTY  2/14   Severe restenosis in the ostial ramus. Status post angioplasty and drug-eluting stent placement with a 2.5 x 18 mm Xience drug-eluting stent   ESOPHAGOGASTRODUODENOSCOPY N/A 08/03/2018   Procedure: ESOPHAGOGASTRODUODENOSCOPY (EGD);  Surgeon: Lin Landsman, MD;  Location: [facility] ENDOSCOPY;  Service: Gastroenterology;  Laterality: N/A;   ESOPHAGOGASTRODUODENOSCOPY (EGD)  WITH PROPOFOL N/A 11/03/2018   Procedure: ESOPHAGOGASTRODUODENOSCOPY (EGD) WITH PROPOFOL;  Surgeon: Lin Landsman, MD;  Location: [facility] ENDOSCOPY;  Service: Gastroenterology;  Laterality: N/A;   ESOPHAGOGASTRODUODENOSCOPY (EGD) WITH PROPOFOL N/A 12/06/2018   Procedure: ESOPHAGOGASTRODUODENOSCOPY (EGD) WITH PROPOFOL;  Surgeon: Lucilla Lame, MD;  Location: [facility] ENDOSCOPY;  Service: Endoscopy;  Laterality: N/A;   ESOPHAGOGASTRODUODENOSCOPY (EGD) WITH PROPOFOL N/A 03/19/2021   Procedure: ESOPHAGOGASTRODUODENOSCOPY (EGD) WITH  PROPOFOL;  Surgeon: Lin Landsman, MD;  Location: [facility] ENDOSCOPY;  Service: Gastroenterology;  Laterality: N/A;   ICD IMPLANT N/A 05/14/2021   Procedure: ICD IMPLANT;  Surgeon: [name], MD;  Location: [place] CV LAB;  Service: Cardiovascular;  Laterality: N/A;   IR CT HEAD LTD  01/30/2021   IR PERCUTANEOUS ART THROMBECTOMY/INFUSION INTRACRANIAL INC DIAG ANGIO  01/30/2021   LEFT HEART CATH AND CORONARY ANGIOGRAPHY N/A 01/28/2021   Procedure: LEFT HEART CATH AND CORONARY ANGIOGRAPHY;  Surgeon: Nelva Bush, MD;  Location: [place] CV LAB;  Service: Cardiovascular;  Laterality: N/A;   RADIOLOGY WITH ANESTHESIA N/A 01/30/2021   Procedure: IR WITH ANESTHESIA;  Surgeon: Luanne Bras, MD;  Location: [place];  Service: Radiology;  Laterality: N/A;   TONSILLECTOMY AND ADENOIDECTOMY     TRANSCAROTID ARTERY REVASCULARIZATION  Left 02/07/2021   Procedure: TRANSCAROTID ARTERY REVASCULARIZATION LEFT;  Surgeon: [name], MD;  Location: [place];  Service: Vascular;  Laterality: Left;    There were no vitals filed for this visit.   Subjective Assessment - 12/02/21 1719     Subjective family noticed slower rate and resultant improved speech intelligibility    Currently in Pain? No/denies                   ADULT SLP TREATMENT - 12/03/21 0001       Treatment Provided   Treatment provided Cognitive-Linquistic      Cognitive-Linquistic Treatment   Treatment focused on Aphasia;Apraxia;Patient/family/caregiver education    Skilled Treatment Skilled treatment session focused on using pacing to slow pt's rate of speech to improve accuracy of motor speech movements. When reading questions on "Would you rather?" pt demosntrated independent improvement in rate of speech but required moderate assistance to slow rate of pacing during his responses. He was able to improve articulation of multi-syllable words to ~ 50% accuracy with minimal cues for pacing each syllable.               SLP Education - 12/02/21 1720     Education Details provided    Person(s) Educated Patient    Methods Explanation;Demonstration;Tactile cues;Verbal cues    Comprehension Verbalized understanding;Need further instruction;Verbal cues required;Tactile cues required              SLP Short Term Goals - 11/19/21 0723       SLP SHORT TERM GOAL #1   Title Pt will produce sentences in response to a question (e.g. whats your favorite holiday and why) with appropriate articulation at 75% accuracy given frequent moderate phonemic placement cues.    Baseline goal met, revised to reflection progress    Time 10    Period --   sessions   Status Revised      SLP SHORT TERM GOAL #2   Title Pt will expressively produce 4+ word sentence in response to situation/picture with 50% accuracy given moderate cues in order to increase ability to communicate basic wants needs.    Time --   10   Period --   sessions   Status  On-going      SLP SHORT TERM GOAL #3   Title Pt will write grammatically correct 5 word sentence about a basic picture with minimal assistance.    Baseline goal met, revised to reflect progress    Time 10    Period --   sessions   Status Revised      SLP SHORT TERM GOAL #4   Title Pt will initiate use of compensatory strategy to repair moments of communication breakdown 2 times in a session.    Baseline new goal    Time 10    Period --   sessions   Status New              SLP Long Term Goals - 11/19/21 0725       SLP LONG TERM GOAL #1   Title Pt will use functional communication skills for social interaction (e.g., greetings,  social etiquette, and short questions/simple sentences) with both familiar and unfamiliar  partners with 90% success.    Target Date 01/01/22              Plan - 12/02/21 1721     Clinical Impression Statement Pt demonstrated improved rate of speech and states that his family has noticed a difference in his speech  intelligibility. In adition to cues for pacing, he requires ocntinued cues to write words during movements of word finding difficulty. When writing down the word, it promotes continued communication and he is also able to say the word. As such, pt's speech continues to halting with decreased speech intelligibility to ~ 75% in causal conversation. As a result, skilled ST intervention continues to be required to target the abovementioned impairments to establish functional communication.    Speech Therapy Frequency 2x / week    Duration 12 weeks    Treatment/Interventions SLP instruction and feedback;Patient/family education;Language facilitation    Potential to Achieve Goals Good             Patient will benefit from skilled therapeutic intervention in order to improve the following deficits and impairments:   Aphasia  Apraxia  Cerebral infarction due to embolism of left middle cerebral artery [facility])    Problem List Patient Active Problem List   Diagnosis Date Noted   Aortic aneurysm ([place]) 05/29/2021   ICD (implantable cardioverter-defibrillator) in place 05/14/2021   Ischemic cardiomyopathy 05/14/2021   NSVT (nonsustained ventricular tachycardia)    Melena 03/17/2021   Upper GI bleeding 03/16/2021   CKD (chronic kidney disease) stage 3, GFR 30-59 ml/min ([place]) 03/16/2021   Hypokalemia 03/16/2021   Carotid stenosis 02/07/2021   Cerebral thrombosis with cerebral infarction 01/30/2021   Cerebral embolism with cerebral infarction 01/30/2021   AKI (acute kidney injury) ([place])    Sustained VT (ventricular tachycardia) 01/27/2021   Peptic ulcer disease    Duodenal ulcer disease    GIB (gastrointestinal bleeding) 08/02/2018   Diabetes mellitus without complication ([place]) 56/86/1683   Erectile dysfunction 07/01/2015   Primary hypertension 08/09/2014   Pure hypercholesterolemia 72/90/2111   Chronic systolic heart failure ([place]) 08/04/2012   Coronary artery disease    Hyperlipidemia     AAA (abdominal aortic aneurysm)    NSTEMI (non-ST elevated myocardial infarction) ([place]) 07/09/2012   Hyperglycemia 07/08/2012   Levana Minetti B. Rutherford Nail M.S., CCC-SLP, [facility] Speech-Language Pathologist Rehabilitation Services Office [place], [place] 12/03/2021, 1:07 PM  [place] MAIN [facility] SERVICES [address] [place], [place], [address] Phone: [phone]   Fax:  [phone]   Name: Garrett Hunter MRN: [id number] Date of Birth: 29-Sep-1946

## 2021-12-03 ENCOUNTER — Encounter: Payer: Medicare Other | Admitting: Speech Pathology

## 2021-12-04 ENCOUNTER — Encounter: Payer: Medicare Other | Admitting: Speech Pathology

## 2021-12-08 ENCOUNTER — Other Ambulatory Visit: Payer: Self-pay

## 2021-12-08 ENCOUNTER — Ambulatory Visit: Payer: Medicare Other | Attending: Neurology | Admitting: Speech Pathology

## 2021-12-08 DIAGNOSIS — R482 Apraxia: Secondary | ICD-10-CM | POA: Insufficient documentation

## 2021-12-08 DIAGNOSIS — R4701 Aphasia: Secondary | ICD-10-CM | POA: Diagnosis not present

## 2021-12-08 DIAGNOSIS — I63412 Cerebral infarction due to embolism of left middle cerebral artery: Secondary | ICD-10-CM | POA: Diagnosis present

## 2021-12-10 ENCOUNTER — Ambulatory Visit: Payer: Medicare Other | Admitting: Speech Pathology

## 2021-12-10 ENCOUNTER — Other Ambulatory Visit: Payer: Self-pay

## 2021-12-10 DIAGNOSIS — I63412 Cerebral infarction due to embolism of left middle cerebral artery: Secondary | ICD-10-CM

## 2021-12-10 DIAGNOSIS — R482 Apraxia: Secondary | ICD-10-CM

## 2021-12-10 DIAGNOSIS — R4701 Aphasia: Secondary | ICD-10-CM

## 2021-12-10 NOTE — Therapy (Signed)
[place] MAIN [facility] SERVICES [address] [place], [place], [address] Phone: [phone]   Fax:  [phone]  Speech Language Pathology Treatment  Patient Details  Name: Garrett Hunter MRN: [id number] Date of Birth: Mar 02, 1946 Referring Provider (SLP): Derinda Late   Encounter Date: 12/08/2021   End of Session - 12/10/21 0811     Visit Number 14    Number of Visits 25    Date for SLP Re-Evaluation 01/01/22    Authorization Type Medicare    Authorization Time Period 10/08/2021 thru 01/01/2022    Authorization - Visit Number 4    Progress Note Due on Visit 10    SLP Start Time 21    SLP Stop Time  1400    SLP Time Calculation (min) 60 min    Activity Tolerance Patient tolerated treatment well             Past Medical History:  Diagnosis Date   AAA (abdominal aortic aneurysm)    thoracoabdominal aortic aneursym, s/p right ilio-femoral bypass 10/12/16; 3V FEVAR with SMA, RRA, and LRA stenting 11/16/16 by Dr. Sammuel Hines [facility])   Benign neoplasm of colon    patient not sure   CHF (congestive heart failure) (HCC)    EF 45-50%   Chronic airway obstruction, not elsewhere classified    pt denies   Coronary artery disease    Inferior MI in 1996 treated with TPA. Cardiac cath in [facility] at [facility] showed chronic occlusion of the proximal RCA and proximal left circumflex without obstructive disease in the LAD. Non-ST elevation myocardial infarction in October 2013 while on vacation in [place]. Cardiac catheterization showed plaque rupture in the proximal ramus with thrombus. He had balloon angioplasty done. 01/28/21 cath    Coronary atherosclerosis of native coronary artery    Hepatitis A    teenager, no longer a problem   Hyperlipidemia    Intolerance to statins.   Hypertension    MI (myocardial infarction) ([place])    x 3 - last one 2013   Renal artery stenosis (HCC)    s/p bilateral renal artery angioplasty 07/26/18   Stroke ([place]) 01/2021    Ventricular tachycardia    s/p cardioverson 01/27/21    Past Surgical History:  Procedure Laterality Date   ABDOMINAL AORTIC ANEURYSM REPAIR     11/16/2016   ANGIOPLASTY     CARDIAC CATHETERIZATION  2011   CARDIAC CATHETERIZATION  2/14   [facility]; 95% lesion in the ramus intermedius.    CATARACT EXTRACTION W/PHACO Left 12/19/2018   Procedure: CATARACT EXTRACTION PHACO AND INTRAOCULAR LENS PLACEMENT ([place])  LEFT;  Surgeon: Eulogio Bear, MD;  Location: [place];  Service: Ophthalmology;  Laterality: Left;   CATARACT EXTRACTION W/PHACO Right 03/06/2019   Procedure: CATARACT EXTRACTION PHACO AND INTRAOCULAR LENS PLACEMENT (IOC)RIGHT;  Surgeon: Eulogio Bear, MD;  Location: [place];  Service: Ophthalmology;  Laterality: Right;   COLONOSCOPY WITH PROPOFOL N/A 03/20/2021   Procedure: COLONOSCOPY WITH PROPOFOL;  Surgeon: Lin Landsman, MD;  Location: [facility] ENDOSCOPY;  Service: Gastroenterology;  Laterality: N/A;   CORONARY ANGIOPLASTY  1996   [facility] x1   CORONARY ANGIOPLASTY  2/14   Severe restenosis in the ostial ramus. Status post angioplasty and drug-eluting stent placement with a 2.5 x 18 mm Xience drug-eluting stent   ESOPHAGOGASTRODUODENOSCOPY N/A 08/03/2018   Procedure: ESOPHAGOGASTRODUODENOSCOPY (EGD);  Surgeon: Lin Landsman, MD;  Location: [facility] ENDOSCOPY;  Service: Gastroenterology;  Laterality: N/A;   ESOPHAGOGASTRODUODENOSCOPY (EGD)  WITH PROPOFOL N/A 11/03/2018   Procedure: ESOPHAGOGASTRODUODENOSCOPY (EGD) WITH PROPOFOL;  Surgeon: Toney Reil, MD;  Location: [facility] ENDOSCOPY;  Service: Gastroenterology;  Laterality: N/A;   ESOPHAGOGASTRODUODENOSCOPY (EGD) WITH PROPOFOL N/A 12/06/2018   Procedure: ESOPHAGOGASTRODUODENOSCOPY (EGD) WITH PROPOFOL;  Surgeon: Midge Minium, MD;  Location: [facility] ENDOSCOPY;  Service: Endoscopy;  Laterality: N/A;   ESOPHAGOGASTRODUODENOSCOPY (EGD) WITH PROPOFOL N/A 03/19/2021   Procedure: ESOPHAGOGASTRODUODENOSCOPY (EGD) WITH  PROPOFOL;  Surgeon: Toney Reil, MD;  Location: [facility] ENDOSCOPY;  Service: Gastroenterology;  Laterality: N/A;   ICD IMPLANT N/A 05/14/2021   Procedure: ICD IMPLANT;  Surgeon: Lanier Prude, MD;  Location: [facility] INVASIVE CV LAB;  Service: Cardiovascular;  Laterality: N/A;   IR CT HEAD LTD  01/30/2021   IR PERCUTANEOUS ART THROMBECTOMY/INFUSION INTRACRANIAL INC DIAG ANGIO  01/30/2021   LEFT HEART CATH AND CORONARY ANGIOGRAPHY N/A 01/28/2021   Procedure: LEFT HEART CATH AND CORONARY ANGIOGRAPHY;  Surgeon: Yvonne Kendall, MD;  Location: [facility] INVASIVE CV LAB;  Service: Cardiovascular;  Laterality: N/A;   RADIOLOGY WITH ANESTHESIA N/A 01/30/2021   Procedure: IR WITH ANESTHESIA;  Surgeon: Julieanne Cotton, MD;  Location: [facility] OR;  Service: Radiology;  Laterality: N/A;   TONSILLECTOMY AND ADENOIDECTOMY     TRANSCAROTID ARTERY REVASCULARIZATION  Left 02/07/2021   Procedure: TRANSCAROTID ARTERY REVASCULARIZATION LEFT;  Surgeon: Nada Libman, MD;  Location: [facility] OR;  Service: Vascular;  Laterality: Left;    There were no vitals filed for this visit.   Subjective Assessment - 12/10/21 0810     Subjective pt with noticably slower rate of speech    Currently in Pain? No/denies                   ADULT SLP TREATMENT - 12/10/21 0001       Treatment Provided   Treatment provided Cognitive-Linquistic      Cognitive-Linquistic Treatment   Treatment focused on Aphasia;Apraxia;Patient/family/caregiver education    Skilled Treatment Skilled treatment session focused on instructing pt on downloading notes app and several language games onto his new cellphone.              SLP Education - 12/10/21 [phone]     Education Details provided    Person(s) Educated Patient    Methods Explanation;Demonstration;Tactile cues;Verbal cues    Comprehension Verbalized understanding;Returned demonstration;Need further instruction              SLP Short Term Goals - 11/19/21 0723        SLP SHORT TERM GOAL #1   Title Pt will produce sentences in response to a question (e.g. whats your favorite holiday and why) with appropriate articulation at 75% accuracy given frequent moderate phonemic placement cues.    Baseline goal met, revised to reflection progress    Time 10    Period --   sessions   Status Revised      SLP SHORT TERM GOAL #2   Title Pt will expressively produce 4+ word sentence in response to situation/picture with 50% accuracy given moderate cues in order to increase ability to communicate basic wants needs.    Time --   10   Period --   sessions   Status On-going      SLP SHORT TERM GOAL #3   Title Pt will write grammatically correct 5 word sentence about a basic picture with minimal assistance.    Baseline goal met, revised to reflect progress    Time 10    Period --   sessions  Status Revised      SLP SHORT TERM GOAL #4   Title Pt will initiate use of compensatory strategy to repair moments of communication breakdown 2 times in a session.    Baseline new goal    Time 10    Period --   sessions   Status New              SLP Long Term Goals - 11/19/21 0725       SLP LONG TERM GOAL #1   Title Pt will use functional communication skills for social interaction (e.g., greetings,  social etiquette, and short questions/simple sentences) with both familiar and unfamiliar  partners with 90% success.    Target Date 01/01/22              Plan - 12/10/21 1779     Clinical Impression Statement Pt continues to present with poor strategies during moments of word finding difficulty and dysfluency. 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 Skilled ST intervention continues to be required to target these impairments in an effort to improve pt's functional communication and increase pt's independence.    Speech Therapy Frequency 2x / week    Duration 12 weeks     Treatment/Interventions SLP instruction and feedback;Patient/family education;Language facilitation    Potential to Achieve Goals Good    SLP Home Exercise Plan provided, see pt instructions section    Consulted and Agree with Plan of Care Patient             Patient will benefit from skilled therapeutic intervention in order to improve the following deficits and impairments:   Aphasia  Apraxia  Cerebral infarction due to embolism of left middle cerebral artery [facility])    Problem List Patient Active Problem List   Diagnosis Date Noted   Aortic aneurysm ([place]) 05/29/2021   ICD (implantable cardioverter-defibrillator) in place 05/14/2021   Ischemic cardiomyopathy 05/14/2021   NSVT (nonsustained ventricular tachycardia)    Melena 03/17/2021   Upper GI bleeding 03/16/2021   CKD (chronic kidney disease) stage 3, GFR 30-59 ml/min (HCC) 03/16/2021   Hypokalemia 03/16/2021   Carotid stenosis 02/07/2021   Cerebral thrombosis with cerebral infarction 01/30/2021   Cerebral embolism with cerebral infarction 01/30/2021   AKI (acute kidney injury) ([place])    Sustained VT (ventricular tachycardia) 01/27/2021   Peptic ulcer disease    Duodenal ulcer disease    GIB (gastrointestinal bleeding) 08/02/2018   Diabetes mellitus without complication ([place]) 39/12/90   Erectile dysfunction 07/01/2015   Primary hypertension 08/09/2014   Pure hypercholesterolemia 33/00/7622   Chronic systolic heart failure ([place]) 08/04/2012   Coronary artery disease    Hyperlipidemia    AAA (abdominal aortic aneurysm)    NSTEMI (non-ST elevated myocardial infarction) ([place]) 07/09/2012   Hyperglycemia 07/08/2012   Leannah Guse B. Rutherford Nail M.S., CCC-SLP, [facility] Pathologist Rehabilitation Services Office [phone], [place] 12/10/2021, 8:14 AM  [place] MAIN [facility] SERVICES [address] [place], [place], [address] Phone: [phone]   Fax:   [phone]   Name: Garrett Hunter MRN: [id number] Date of Birth: June 28, 1946

## 2021-12-10 NOTE — Patient Instructions (Signed)
Use notes app to help during moment of communication breakdowns ?

## 2021-12-11 ENCOUNTER — Ambulatory Visit (INDEPENDENT_AMBULATORY_CARE_PROVIDER_SITE_OTHER): Payer: Medicare Other | Admitting: Neurology

## 2021-12-11 ENCOUNTER — Encounter: Payer: Self-pay | Admitting: Neurology

## 2021-12-11 VITALS — BP 116/59 | HR 59 | Ht 66.0 in | Wt 202.0 lb

## 2021-12-11 DIAGNOSIS — I69320 Aphasia following cerebral infarction: Secondary | ICD-10-CM | POA: Diagnosis not present

## 2021-12-11 DIAGNOSIS — I6522 Occlusion and stenosis of left carotid artery: Secondary | ICD-10-CM

## 2021-12-11 DIAGNOSIS — I6330 Cerebral infarction due to thrombosis of unspecified cerebral artery: Secondary | ICD-10-CM

## 2021-12-11 NOTE — Patient Instructions (Signed)
Practice clapping syllables  ?

## 2021-12-11 NOTE — Patient Instructions (Signed)
I had a long d/w patient about his remote stroke and carotid stenosis,, risk for recurrent stroke/TIAs, personally independently reviewed imaging studies and stroke evaluation results and answered questions.Continue Plavixl 75 mg daily  for secondary stroke prevention and maintain strict control of hypertension with blood pressure goal below 130/90, diabetes with hemoglobin A1c goal below 6.5% and lipids with LDL cholesterol goal below 70 mg/dL. I also advised the patient to eat a healthy diet with plenty of whole grains, cereals, fruits and vegetables, exercise regularly and maintain ideal body weight check follow-up carotid ultrasound for surveillance.  Followup in the future with me in 1 year or call earlier if necessary. ? ?

## 2021-12-11 NOTE — Progress Notes (Signed)
[facility] [address] [place]. [place] [address] [phone]       OFFICE FOLLOW-UP NOTE  [name] Date of Birth:  1945/11/16 Medical Record Number:  [id number]   HPI: Initial visit 05/26/2021 [name] is a pleasant 76 year old Caucasian male seen today for initial office follow-up visit following hospital admission for stroke in April 2022.  He is accompanied by his wife today.  History is obtained from them and review of electronic medical records and I personally reviewed available pertinent imaging films in PACS. [name] is a 76 y.o. male with history of HTN, HLD, prior MI, CAD, AAA, CHD, ICM who was initially admitted to CVICU for sustained Vtach with cardioversion to NSR and EP evaluation who had a CODE STROKE called on 01/30/2021 after sudden status change with aphasia and R sided facial droop noted by RN. CT Head confirmed that the patient had a small L MCA infarct involving the L insula and frontal operculum. Patient received tPA. CTA Head noted an acute occlusion of the L MCA at M3 and patient had unsuccesful thrombectomy.  Attempt.  MRI scan showed moderate size left frontal MCA branch infarct.  2D echo showed diminished ejection fraction of 45 to 50% with left ventricular global hypokinesis and mildly dilated left atrium.  Carotid ultrasound showed 1-39% right ICA and 40-59% left ICA stenosis.  LDL cholesterol 64 mg percent.  Hemoglobin A1c was 6.0.  Patient was started on dual antiplatelet therapy of aspirin and Plavix for 3 months.  He was seen by vascular surgery and underwent elective left carotid revascularization on 02/07/2021 by Dr. Trula Slade with a TCAR procedure.  He also had electively ICD implanted by Dr. Quentin Ore on 05/14/2021 for his wide-complex tachycardia.[name]  He was admitted for melena on 03/16/2021 and had colonoscopy but no active bleeding source of identified.  An upper endoscopy as well and 1 small ulcer was noted which was  unlikely to be the bleeding source.  Aspirin was discontinued and he was discharged on proton pump inhibitors for 2 months.  Patient states that his speech is improving but he still has some word finding difficulties and has to search for words and talks slowly.  He has some good days and bad days.  When he gets excited or tries to talk fast he notices more difficulty.  He feels his taste has also not improved.  He has had no recurrent stroke or TIA symptoms.  He is currently getting home speech therapy which is about 2 and this week.  He has no new complaints.  Is tolerating Plavix well but does have some minor skin bruising.  Blood pressures well controlled today it is 120/69.  He remains on Crestor which is tolerating well without muscle aches and pains.  He did have follow-up carotid ultrasound in 03/10/2021 in vascular surgeon office which showed no significant restenosis and patent left ICA stent. Update 12/11/2021 : He returns for follow-up after last visit 6 months ago.  He is doing well.  He has had no recurrent stroke or TIA symptoms.  He is remains on Plavix which is tolerating well with minor bruising but no bleeding.  His blood pressure is under good control and today it is 116/59.  He is tolerating Crestor well without muscle aches and pains.  He still has some occasional word hesitancy and nonfluent speech particularly when he is tired.  He still getting outpatient speech therapy at [facility].  He had follow-up lipid profile  checked on 09/30/2021 and LDL cholesterol was 47 mg percent and hemoglobin A1c was 5.5.  He has not had any follow-up with vascular surgery or follow-up carotid ultrasound for more than 6 months.  He has no complaints today ROS:   14 system review of systems is positive for petechiae, bruising, speech and word finding difficulties and all other systems negative  PMH:  Past Medical History:  Diagnosis Date   AAA (abdominal aortic aneurysm)    thoracoabdominal aortic aneursym, s/p  right ilio-femoral bypass 10/12/16; 3V FEVAR with SMA, RRA, and LRA stenting 11/16/16 by Dr. Sammuel Hines [facility])   Benign neoplasm of colon    patient not sure   CHF (congestive heart failure) (HCC)    EF 45-50%   Chronic airway obstruction, not elsewhere classified    pt denies   Coronary artery disease    Inferior MI in 1996 treated with TPA. Cardiac cath in 2011 at [facility] showed chronic occlusion of the proximal RCA and proximal left circumflex without obstructive disease in the LAD. Non-ST elevation myocardial infarction in October 2013 while on vacation in [place]. Cardiac catheterization showed plaque rupture in the proximal ramus with thrombus. He had balloon angioplasty done. 01/28/21 cath    Coronary atherosclerosis of native coronary artery    Hepatitis A    teenager, no longer a problem   Hyperlipidemia    Intolerance to statins.   Hypertension    MI (myocardial infarction) ([place])    x 3 - last one 2013   Renal artery stenosis (HCC)    s/p bilateral renal artery angioplasty 07/26/18   Stroke ([place]) 01/2021   Ventricular tachycardia    s/p cardioverson 01/27/21    Social History:  Social History   Socioeconomic History   Marital status: Married    Spouse name: Not on file   Number of children: 4   Years of education: Not on file   Highest education level: Not on file  Occupational History   Not on file  Tobacco Use   Smoking status: Former    Packs/day: 2.00    Years: 40.00    Pack years: 80.00    Types: Cigarettes    Quit date: 2004    Years since quitting: 19.1   Smokeless tobacco: Former    Types: Nurse, children's Use: Never used  Substance and Sexual Activity   Alcohol use: Not Currently    Alcohol/week: 3.0 standard drinks    Types: 3 Cans of beer per week    Comment: occassionally   Drug use: No   Sexual activity: Yes  Other Topics Concern   Not on file  Social History Narrative   Lives at home with wife, works   Investment banker, operational of Adult nurse Strain: Not on file  Food Insecurity: Not on file  Transportation Needs: Not on file  Physical Activity: Not on file  Stress: Not on file  Social Connections: Not on file  Intimate Partner Violence: Not on file    Medications:   Current Outpatient Medications on File Prior to Visit  Medication Sig Dispense Refill   amiodarone (PACERONE) 200 MG tablet Take 1 tablet (200 mg total) by mouth daily. 90 tablet 3   carvedilol (COREG) 12.5 MG tablet TAKE 1 TABLET TWICE A DAY 180 tablet 0   clopidogrel (PLAVIX) 75 MG tablet Take 75 mg by mouth daily.     Coenzyme Q10 (COQ10) 100 MG CAPS Take 1 capsule by  mouth every evening.     FARXIGA 10 MG TABS tablet Take 1 tablet (10 mg total) by mouth daily. 90 tablet 3   mexiletine (MEXITIL) 150 MG capsule Take 1 capsule (150 mg total) by mouth 2 (two) times daily. 180 capsule 3   Multiple Vitamin (MULTIVITAMIN WITH MINERALS) TABS tablet Take 1 tablet by mouth daily.     pantoprazole (PROTONIX) 40 MG tablet Take 1 tablet (40 mg total) by mouth daily. 90 tablet 3   rosuvastatin (CRESTOR) 5 MG tablet Take 1 tablet (5 mg total) by mouth daily. 90 tablet 3   sacubitril-valsartan (ENTRESTO) 24-26 MG Take 1 tablet by mouth 2 (two) times daily.     vitamin C (ASCORBIC ACID) 500 MG tablet Take 500 mg by mouth daily.     No current facility-administered medications on file prior to visit.    Allergies:   Allergies  Allergen Reactions   Crestor [Rosuvastatin]     Cramps. (Hight dose of Crestor)   Livalo [Pitavastatin]     myalgia   Pravachol [Pravastatin Sodium]     Cramps   Zocor [Simvastatin]     Cramps    Physical Exam General: well developed, well nourished, pleasant elderly Caucasian male seated, in no evident distress Head: head normocephalic and atraumatic.  Neck: supple with no carotid or supraclavicular bruits Cardiovascular: regular rate and rhythm, no murmurs Musculoskeletal: no deformity Skin:  no rash scattered  petichiae on the skin of forearms and hands bilaterally Vascular:  Normal pulses all extremities Vitals:   12/11/21 1508  BP: (!) 116/59  Pulse: (!) 59   Neurologic Exam Mental Status: Awake and fully alert. Oriented to place and time. Recent and remote memory intact. Attention span, concentration and fund of knowledge appropriate. Mood and affect appropriate.  Mild expressive aphasia with nonfluent speech with some word finding difficulties and occasional paraphasic errors.  Good comprehension and naming.  Poor repetition. Cranial Nerves: Fundoscopic exam not done. Pupils equal, briskly reactive to light. Extraocular movements full without nystagmus. Visual fields full to confrontation. Hearing intact. Facial sensation intact. Face, tongue, palate moves normally and symmetrically.  Motor: Normal bulk and tone. Normal strength in all tested extremity muscles. Sensory.: intact to touch ,pinprick .position and vibratory sensation.  Coordination: Rapid alternating movements normal in all extremities. Finger-to-nose and heel-to-shin performed accurately bilaterally. Gait and Station: Arises from chair without difficulty. Stance is normal. Gait demonstrates normal stride length and balance . Able to heel, toe and tandem walk without difficulty.  Reflexes: 1+ and symmetric. Toes downgoing.      ASSESSMENT: 76 year old Caucasian male with left frontal MCA branch infarct symptomatic from proximal left carotid stenosis in April 2022 treated with IV tPA but with failed attempt at thrombectomy.  S/p elective left carotid revascularization via TCAR procedure on 02/07/2021 by Dr. Trula Slade.  Vascular risk factors of hyperlipidemia, hypertension, coronary artery disease, cardiomyopathy and congestive heart failure.     PLAN: I had a long d/w patient about his remote stroke and carotid stenosis,, risk for recurrent stroke/TIAs, personally independently reviewed imaging studies and stroke evaluation results and  answered questions.Continue Plavixl 75 mg daily  for secondary stroke prevention and maintain strict control of hypertension with blood pressure goal below 130/90, diabetes with hemoglobin A1c goal below 6.5% and lipids with LDL cholesterol goal below 70 mg/dL. I also advised the patient to eat a healthy diet with plenty of whole grains, cereals, fruits and vegetables, exercise regularly and maintain ideal body weight check follow-up carotid  ultrasound for surveillance.  Followup in the future with me in 1 year or call earlier if necessary. Greater than 50% of time during this 30 minute visit was spent on counseling,explanation of diagnosis, of carotid stenosis and stroke and aphasia planning of further management, discussion with patient and family and coordination of care Antony Contras, MD Note: This document was prepared with digital dictation and possible smart phrase technology. Any transcriptional errors that result from this process are unintentional

## 2021-12-11 NOTE — Therapy (Signed)
[place] MAIN [facility] SERVICES [address] [place], [place], [address] Phone: [phone]   Fax:  [phone]  Speech Language Pathology Treatment  Patient Details  Name: Garrett Hunter MRN: [id number] Date of Birth: [birth year] Referring Provider (SLP): Derinda Late   Encounter Date: 12/10/2021   End of Session - 12/11/21 1403     Visit Number 15    Number of Visits 25    Date for SLP Re-Evaluation 01/01/22    Authorization Type Medicare    Authorization Time Period 10/08/2021 thru 01/01/2022    Authorization - Visit Number 5    Progress Note Due on Visit 10    SLP Start Time 51    SLP Stop Time  1400    SLP Time Calculation (min) 60 min    Activity Tolerance Patient tolerated treatment well             Past Medical History:  Diagnosis Date   AAA (abdominal aortic aneurysm)    thoracoabdominal aortic aneursym, s/p right ilio-femoral bypass 10/12/16; 3V FEVAR with SMA, RRA, and LRA stenting 11/16/16 by Dr. Sammuel Hines [facility])   Benign neoplasm of colon    patient not sure   CHF (congestive heart failure) (HCC)    EF 45-50%   Chronic airway obstruction, not elsewhere classified    pt denies   Coronary artery disease    Inferior MI in 1996 treated with TPA. Cardiac cath in 2011 at [facility] showed chronic occlusion of the proximal RCA and proximal left circumflex without obstructive disease in the LAD. Non-ST elevation myocardial infarction in October 2013 while on vacation in [place]. Cardiac catheterization showed plaque rupture in the proximal ramus with thrombus. He had balloon angioplasty done. 01/28/21 cath    Coronary atherosclerosis of native coronary artery    Hepatitis A    teenager, no longer a problem   Hyperlipidemia    Intolerance to statins.   Hypertension    MI (myocardial infarction) ([place])    x 3 - last one 2013   Renal artery stenosis (HCC)    s/p bilateral renal artery angioplasty 07/26/18   Stroke ([place]) 01/2021    Ventricular tachycardia    s/p cardioverson 01/27/21    Past Surgical History:  Procedure Laterality Date   ABDOMINAL AORTIC ANEURYSM REPAIR     11/16/2016   ANGIOPLASTY     CARDIAC CATHETERIZATION  2011   CARDIAC CATHETERIZATION  2/14   [facility]; 95% lesion in the ramus intermedius.    CATARACT EXTRACTION W/PHACO Left 12/19/2018   Procedure: CATARACT EXTRACTION PHACO AND INTRAOCULAR LENS PLACEMENT ([place])  LEFT;  Surgeon: Eulogio Bear, MD;  Location: [place];  Service: Ophthalmology;  Laterality: Left;   CATARACT EXTRACTION W/PHACO Right 03/06/2019   Procedure: CATARACT EXTRACTION PHACO AND INTRAOCULAR LENS PLACEMENT (IOC)RIGHT;  Surgeon: Eulogio Bear, MD;  Location: [place];  Service: Ophthalmology;  Laterality: Right;   COLONOSCOPY WITH PROPOFOL N/A 03/20/2021   Procedure: COLONOSCOPY WITH PROPOFOL;  Surgeon: Lin Landsman, MD;  Location: [facility] ENDOSCOPY;  Service: Gastroenterology;  Laterality: N/A;   CORONARY ANGIOPLASTY  1996   [facility] x1   CORONARY ANGIOPLASTY  2/14   Severe restenosis in the ostial ramus. Status post angioplasty and drug-eluting stent placement with a 2.5 x 18 mm Xience drug-eluting stent   ESOPHAGOGASTRODUODENOSCOPY N/A 08/03/2018   Procedure: ESOPHAGOGASTRODUODENOSCOPY (EGD);  Surgeon: Lin Landsman, MD;  Location: [facility] ENDOSCOPY;  Service: Gastroenterology;  Laterality: N/A;   ESOPHAGOGASTRODUODENOSCOPY (EGD)  WITH PROPOFOL N/A 11/03/2018   Procedure: ESOPHAGOGASTRODUODENOSCOPY (EGD) WITH PROPOFOL;  Surgeon: Lin Landsman, MD;  Location: [facility] ENDOSCOPY;  Service: Gastroenterology;  Laterality: N/A;   ESOPHAGOGASTRODUODENOSCOPY (EGD) WITH PROPOFOL N/A 12/06/2018   Procedure: ESOPHAGOGASTRODUODENOSCOPY (EGD) WITH PROPOFOL;  Surgeon: Lucilla Lame, MD;  Location: [facility] ENDOSCOPY;  Service: Endoscopy;  Laterality: N/A;   ESOPHAGOGASTRODUODENOSCOPY (EGD) WITH PROPOFOL N/A 03/19/2021   Procedure: ESOPHAGOGASTRODUODENOSCOPY (EGD) WITH  PROPOFOL;  Surgeon: Lin Landsman, MD;  Location: [facility] ENDOSCOPY;  Service: Gastroenterology;  Laterality: N/A;   ICD IMPLANT N/A 05/14/2021   Procedure: ICD IMPLANT;  Surgeon: [name], MD;  Location: [place] CV LAB;  Service: Cardiovascular;  Laterality: N/A;   IR CT HEAD LTD  01/30/2021   IR PERCUTANEOUS ART THROMBECTOMY/INFUSION INTRACRANIAL INC DIAG ANGIO  01/30/2021   LEFT HEART CATH AND CORONARY ANGIOGRAPHY N/A 01/28/2021   Procedure: LEFT HEART CATH AND CORONARY ANGIOGRAPHY;  Surgeon: Nelva Bush, MD;  Location: [place] CV LAB;  Service: Cardiovascular;  Laterality: N/A;   RADIOLOGY WITH ANESTHESIA N/A 01/30/2021   Procedure: IR WITH ANESTHESIA;  Surgeon: Luanne Bras, MD;  Location: [place];  Service: Radiology;  Laterality: N/A;   TONSILLECTOMY AND ADENOIDECTOMY     TRANSCAROTID ARTERY REVASCULARIZATION  Left 02/07/2021   Procedure: TRANSCAROTID ARTERY REVASCULARIZATION LEFT;  Surgeon: [name], MD;  Location: [place];  Service: Vascular;  Laterality: Left;    There were no vitals filed for this visit.   Subjective Assessment - 12/11/21 1352     Subjective pt with less word finding struggles during today's conversation    Currently in Pain? No/denies                   ADULT SLP TREATMENT - 12/11/21 0001       Treatment Provided   Treatment provided Cognitive-Linquistic      Cognitive-Linquistic Treatment   Treatment focused on Apraxia;Patient/family/caregiver education    Skilled Treatment Skilled treatment session focused on increasing pt's speech intelligiblity. Pt continues with fast rate and severe motor movement difficulty with multi-syllable words. Despite max multimodal assistance, pt continues to struggle with using pacing board for multiple syllable words and as a result, he continues with misarticulation of syllables as he pcaes each word - not the syllables. SLP further instructed pt to cough out syllables and after  some practice he became ~ 25% accurate. When pacing or clapping each syllable to words, pt's speech intelligibility improves greatly.              SLP Education - 12/11/21 1402     Education Details breaking words into syllables for increase articulatory precision    Person(s) Educated Patient    Methods Explanation;Demonstration;Verbal cues;Handout;Tactile cues    Comprehension Verbalized understanding;Need further instruction;Tactile cues required;Verbal cues required              SLP Short Term Goals - 11/19/21 0723       SLP SHORT TERM GOAL #1   Title Pt will produce sentences in response to a question (e.g. whats your favorite holiday and why) with appropriate articulation at 75% accuracy given frequent moderate phonemic placement cues.    Baseline goal met, revised to reflection progress    Time 10    Period --   sessions   Status Revised      SLP SHORT TERM GOAL #2   Title Pt will expressively produce 4+ word sentence in response to situation/picture with 50% accuracy given moderate cues in  order to increase ability to communicate basic wants needs.    Time --   10   Period --   sessions   Status On-going      SLP SHORT TERM GOAL #3   Title Pt will write grammatically correct 5 word sentence about a basic picture with minimal assistance.    Baseline goal met, revised to reflect progress    Time 10    Period --   sessions   Status Revised      SLP SHORT TERM GOAL #4   Title Pt will initiate use of compensatory strategy to repair moments of communication breakdown 2 times in a session.    Baseline new goal    Time 10    Period --   sessions   Status New              SLP Long Term Goals - 11/19/21 0725       SLP LONG TERM GOAL #1   Title Pt will use functional communication skills for social interaction (e.g., greetings,  social etiquette, and short questions/simple sentences) with both familiar and unfamiliar  partners with 90% success.    Target  Date 01/01/22              Plan - 12/11/21 1403     Clinical Impression Statement Pt continues to present with poor strategies during moments of word finding difficulty and dysfluency. 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 Skilled ST intervention continues to be required to target these impairments in an effort to improve pt's functional communication and increase pt's independence.    Speech Therapy Frequency 2x / week    Duration 12 weeks    Treatment/Interventions SLP instruction and feedback;Patient/family education;Language facilitation    Potential to Achieve Goals Good    SLP Home Exercise Plan provided, see pt instructions section    Consulted and Agree with Plan of Care Patient             Patient will benefit from skilled therapeutic intervention in order to improve the following deficits and impairments:   Aphasia  Apraxia  Cerebral infarction due to embolism of left middle cerebral artery [facility])    Problem List Patient Active Problem List   Diagnosis Date Noted   Aortic aneurysm ([place]) 05/29/2021   ICD (implantable cardioverter-defibrillator) in place 05/14/2021   Ischemic cardiomyopathy 05/14/2021   NSVT (nonsustained ventricular tachycardia)    Melena 03/17/2021   Upper GI bleeding 03/16/2021   CKD (chronic kidney disease) stage 3, GFR 30-59 ml/min (HCC) 03/16/2021   Hypokalemia 03/16/2021   Carotid stenosis 02/07/2021   Cerebral thrombosis with cerebral infarction 01/30/2021   Cerebral embolism with cerebral infarction 01/30/2021   AKI (acute kidney injury) ([place])    Sustained VT (ventricular tachycardia) 01/27/2021   Peptic ulcer disease    Duodenal ulcer disease    GIB (gastrointestinal bleeding) 08/02/2018   Diabetes mellitus without complication ([place]) 24/23/5361   Erectile dysfunction 07/01/2015   Primary hypertension 08/09/2014   Pure  hypercholesterolemia 44/31/5400   Chronic systolic heart failure ([place]) 08/04/2012   Coronary artery disease    Hyperlipidemia    AAA (abdominal aortic aneurysm)    NSTEMI (non-ST elevated myocardial infarction) ([place]) 07/09/2012   Hyperglycemia 07/08/2012   Norberto Wishon B. Rutherford Nail M.S., CCC-SLP, [facility] Speech-Language Pathologist Rehabilitation Services Office [phone], [place] 12/11/2021, 2:04 PM  [place]  MAIN [facility] SERVICES [place], [place], [address] Phone: [phone]   Fax:  [phone]   Name: Garrett Hunter MRN: [id number] Date of Birth: 06-May-1946

## 2021-12-15 ENCOUNTER — Other Ambulatory Visit: Payer: Self-pay

## 2021-12-15 ENCOUNTER — Ambulatory Visit: Payer: Medicare Other | Admitting: Speech Pathology

## 2021-12-15 DIAGNOSIS — I63412 Cerebral infarction due to embolism of left middle cerebral artery: Secondary | ICD-10-CM

## 2021-12-15 DIAGNOSIS — R4701 Aphasia: Secondary | ICD-10-CM

## 2021-12-15 DIAGNOSIS — R482 Apraxia: Secondary | ICD-10-CM

## 2021-12-16 ENCOUNTER — Other Ambulatory Visit
Admission: RE | Admit: 2021-12-16 | Discharge: 2021-12-16 | Disposition: A | Discharge status: Home / Self Care | Payer: Medicare Other | Attending: Cardiology | Admitting: Cardiology

## 2021-12-16 ENCOUNTER — Telehealth: Payer: Self-pay | Admitting: Cardiology

## 2021-12-16 ENCOUNTER — Other Ambulatory Visit: Payer: Medicare Other

## 2021-12-16 ENCOUNTER — Telehealth: Payer: Self-pay

## 2021-12-16 DIAGNOSIS — I4729 Other ventricular tachycardia: Secondary | ICD-10-CM | POA: Diagnosis present

## 2021-12-16 LAB — MAGNESIUM: Magnesium: 2.3 mg/dL (ref 1.7–2.4)

## 2021-12-16 NOTE — Therapy (Signed)
[place] [facility] [facility]IN REHAB SERVICES ?[place], [place], [address] ?Phone: [phone]   Fax:  [phone] ? ?Speech Language Pathology Treatment ? ?Patient Details  ?Name: Garrett Hunter ?MRN: [id number] ?Date of Birth: 10/25/1945 ?Referring Provider (SLP): Derinda Late ? ? ?Encounter Date: 12/15/2021 ? ? End of Session - 12/16/21 1124   ? ? Visit Number 16   ? Number of Visits 25   ? Date for SLP Re-Evaluation 01/01/22   ? Authorization Type Medicare   ? Authorization Time Period 10/08/2021 thru 01/01/2022   ? Authorization - Visit Number 6   ? Progress Note Due on Visit 10   ? SLP Start Time 1300   ? SLP Stop Time  1400   ? SLP Time Calculation (min) 60 min   ? Activity Tolerance Patient tolerated treatment well   ? ?  ?  ? ?  ? ? ?Past Medical History:  ?Diagnosis Date  ? AAA (abdominal aortic aneurysm)   ? thoracoabdominal aortic aneursym, s/p right ilio-femoral bypass 10/12/16; 3V FEVAR with SMA, RRA, and LRA stenting 11/16/16 by Dr. Sammuel Hines [facility])  ? Benign neoplasm of colon   ? patient not sure  ? CHF (congestive heart failure) ([place])   ? EF 45-50%  ? Chronic airway obstruction, not elsewhere classified   ? pt denies  ? Coronary artery disease   ? Inferior MI in 1996 treated with TPA. Cardiac cath in 2011 at [facility] showed chronic occlusion of the proximal RCA and proximal left circumflex without obstructive disease in the LAD. Non-ST elevation myocardial infarction in October 2013 while on vacation in [place]. Cardiac catheterization showed plaque rupture in the proximal ramus with thrombus. He had balloon angioplasty done. 01/28/21 cath   ? Coronary atherosclerosis of native coronary artery   ? Hepatitis A   ? teenager, no longer a problem  ? Hyperlipidemia   ? Intolerance to statins.  ? Hypertension   ? MI (myocardial infarction) ([place])   ? x 3 - last one 2013  ? Renal artery stenosis (HCC)   ? s/p bilateral renal artery angioplasty 07/26/18  ? Stroke [facility]) 01/2021  ?  Ventricular tachycardia   ? s/p cardioverson 01/27/21  ? ? ?Past Surgical History:  ?Procedure Laterality Date  ? ABDOMINAL AORTIC ANEURYSM REPAIR    ? 11/16/2016  ? ANGIOPLASTY    ? CARDIAC CATHETERIZATION  2011  ? CARDIAC CATHETERIZATION  2/14  ? [facility]; 95% lesion in the ramus intermedius.   ? CATARACT EXTRACTION W/PHACO Left 12/19/2018  ? Procedure: CATARACT EXTRACTION PHACO AND INTRAOCULAR LENS PLACEMENT ([place])  LEFT;  Surgeon: Eulogio Bear, MD;  Location: [place];  Service: Ophthalmology;  Laterality: Left;  ? CATARACT EXTRACTION W/PHACO Right 03/06/2019  ? Procedure: CATARACT EXTRACTION PHACO AND INTRAOCULAR LENS PLACEMENT (IOC)RIGHT;  Surgeon: Eulogio Bear, MD;  Location: [place];  Service: Ophthalmology;  Laterality: Right;  ? COLONOSCOPY WITH PROPOFOL N/A 03/20/2021  ? Procedure: COLONOSCOPY WITH PROPOFOL;  Surgeon: Lin Landsman, MD;  Location: [facility] ENDOSCOPY;  Service: Gastroenterology;  Laterality: N/A;  ? CORONARY ANGIOPLASTY  1996  ? [facility] x1  ? CORONARY ANGIOPLASTY  2/14  ? Severe restenosis in the ostial ramus. Status post angioplasty and drug-eluting stent placement with a 2.5 x 18 mm Xience drug-eluting stent  ? ESOPHAGOGASTRODUODENOSCOPY N/A 08/03/2018  ? Procedure: ESOPHAGOGASTRODUODENOSCOPY (EGD);  Surgeon: Lin Landsman, MD;  Location[facility] ENDOSCOPY;  Service: Gastroenterology;  Laterality: N/A;  ? ESOPHAGOGASTRODUODENOSCOPY (EGD)  WITH PROPOFOL N/A 11/03/2018  ? Procedure: ESOPHAGOGASTRODUODENOSCOPY (EGD) WITH PROPOFOL;  Surgeon: Lin Landsman, MD;  Location: [facility] ENDOSCOPY;  Service: Gastroenterology;  Laterality: N/A;  ? ESOPHAGOGASTRODUODENOSCOPY (EGD) WITH PROPOFOL N/A 12/06/2018  ? Procedure: ESOPHAGOGASTRODUODENOSCOPY (EGD) WITH PROPOFOL;  Surgeon: Lucilla Lame, MD;  Location: [facility] ENDOSCOPY;  Service: Endoscopy;  Laterality: N/A;  ? ESOPHAGOGASTRODUODENOSCOPY (EGD) WITH PROPOFOL N/A 03/19/2021  ? Procedure: ESOPHAGOGASTRODUODENOSCOPY (EGD) WITH  PROPOFOL;  Surgeon: Lin Landsman, MD;  Location: [facility] ENDOSCOPY;  Service: Gastroenterology;  Laterality: N/A;  ? ICD IMPLANT N/A 05/14/2021  ? Procedure: ICD IMPLANT;  Surgeon: [name], MD;  Location: [place] CV LAB;  Service: Cardiovascular;  Laterality: N/A;  ? IR CT HEAD LTD  01/30/2021  ? IR PERCUTANEOUS ART THROMBECTOMY/INFUSION INTRACRANIAL INC DIAG ANGIO  01/30/2021  ? LEFT HEART CATH AND CORONARY ANGIOGRAPHY N/A 01/28/2021  ? Procedure: LEFT HEART CATH AND CORONARY ANGIOGRAPHY;  Surgeon: Nelva Bush, MD;  Location: [place] CV LAB;  Service: Cardiovascular;  Laterality: N/A;  ? RADIOLOGY WITH ANESTHESIA N/A 01/30/2021  ? Procedure: IR WITH ANESTHESIA;  Surgeon: Luanne Bras, MD;  Location: [place];  Service: Radiology;  Laterality: N/A;  ? TONSILLECTOMY AND ADENOIDECTOMY    ? TRANSCAROTID ARTERY REVASCULARIZATION?  Left 02/07/2021  ? Procedure: TRANSCAROTID ARTERY REVASCULARIZATION LEFT;  Surgeon: [name], MD;  Location: [facility] OR;  Service: Vascular;  Laterality: Left;  ? ? ?There were no vitals filed for this visit. ? ? Subjective Assessment - 12/16/21 1123   ? ? Subjective "I left my phone at the shop"   ? Currently in Pain? No/denies   ? ?  ?  ? ?  ? ? ? ? ? ? ? ? ADULT SLP TREATMENT - 12/16/21 0001   ? ?  ? Treatment Provided  ? Treatment provided Cognitive-Linquistic   ?  ? Cognitive-Linquistic Treatment  ? 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 Skilled Treatment Skilled treatment session focused on pt's language impairments, specifically word finding, written language and apraxia of speech.  ? ?SLP facilitated session by providing the following interventions:     ? ?Written Language: Pt required moderate assistance when spelling multi-syllabic words; SLP facilitated sentence generation by providing 2 synonyms for pt to make sentences out of. Pt required mod to max assistance for sentence creation (word finding) and for  grammatical accuracy      ? ?EXAMPLE: beard - whiskers    Your face looked it needs to be shaved, whisker or you maybe your going to grow a beard    Beard on some people look good on some other not as good     ? ?Apraxia of Speech: When proofreading generated sentences pt demonstrated slow rate d/t concentration   ? ?  ?  ? ?  ? ? ? SLP Education - 12/16/21 1123   ? ? Education Details word finding strategies, grammar, apraxia of speech   ? Person(s) Educated Patient   ? Methods Explanation;Demonstration;Tactile cues;Verbal cues;Handout   ? Comprehension Verbalized understanding;Need further instruction;Verbal cues required   ? ?  ?  ? ?  ? ? ? SLP Short Term Goals - 11/19/21 0723   ? ?  ? SLP SHORT TERM GOAL #1  ? Title Pt will produce sentences in response to a question (e.g. what?s your favorite holiday and why) with appropriate articulation at 75% accuracy given frequent moderate phonemic placement cues.   ? Baseline goal met, revised to reflection progress   ? Time  10   ? Period --   sessions  ? Status Revised   ?  ? SLP SHORT TERM GOAL #2  ? Title Pt will expressively produce 4+ word sentence in response to situation/picture with 50% accuracy given moderate cues in order to increase ability to communicate basic wants needs.   ? Time --   10  ? Period --   sessions  ? Status On-going   ?  ? SLP SHORT TERM GOAL #3  ? Title Pt will write grammatically correct 5 word sentence about a basic picture with minimal assistance.   ? Baseline goal met, revised to reflect progress   ? Time 10   ? Period --   sessions  ? Status Revised   ?  ? SLP SHORT TERM GOAL #4  ? Title Pt will initiate use of compensatory strategy to repair moments of communication breakdown 2 times in a session.   ? Baseline new goal   ? Time 10   ? Period --   sessions  ? Status New   ? ?  ?  ? ?  ? ? ? SLP Long Term Goals - 11/19/21 0725   ? ?  ? SLP LONG TERM GOAL #1  ? Title Pt will use functional communication skills for social interaction (e.g.,  greetings,  social etiquette, and short questions/simple sentences) with both familiar and unfamiliar  partners with 90% success.   ? Target Date 01/01/22   ? ?  ?  ? ?  ? ? ? Plan - 12/16/21 1124   ? ? Clinic

## 2021-12-16 NOTE — Telephone Encounter (Signed)
[place] Alert Received:  ?1 VT1 detected between Dec 15, 2021 1:51:02 AM and Dec 16, 2021 1:51:02 AM ? ?Successful telephone encounter to patient to follow up on s/s of VT with [facility] yesterday at 4:29 pm. Patient states he "just did not feel well" at that time. Denies syncopal episode. Feels well today. Medications reviewed. Patient is compliant with all medications as prescribed including coreg, amiodarone, and mexiletine. Reviewed with Dr. Quentin Ore. Patient is instructed to increase amiodarone to '200mg'$  BID x 7 days. BMET and Mag ordered. Patient will have labs drawn in [facility]. [place] driving law and shock plan reviewed. Will continue to monitor remotes.  ? ? ? ? ? ? ? ? ? ? ? ?

## 2021-12-16 NOTE — Telephone Encounter (Signed)
LMOV to schedule labs this week ? ?

## 2021-12-17 ENCOUNTER — Other Ambulatory Visit: Payer: Self-pay

## 2021-12-17 ENCOUNTER — Ambulatory Visit: Payer: Medicare Other | Admitting: Speech Pathology

## 2021-12-17 DIAGNOSIS — I63412 Cerebral infarction due to embolism of left middle cerebral artery: Secondary | ICD-10-CM

## 2021-12-17 DIAGNOSIS — R4701 Aphasia: Secondary | ICD-10-CM

## 2021-12-17 DIAGNOSIS — R482 Apraxia: Secondary | ICD-10-CM

## 2021-12-17 LAB — BASIC METABOLIC PANEL
Anion gap: 4 — ABNORMAL LOW (ref 5–15)
BUN: 22 mg/dL (ref 8–23)
CO2: 27 mmol/L (ref 22–32)
Calcium: 8.9 mg/dL (ref 8.9–10.3)
Chloride: 106 mmol/L (ref 98–111)
Creatinine, Ser: 1.17 mg/dL (ref 0.61–1.24)
GFR, Estimated: >60 mL/min (ref >60)
Glucose, Bld: 94 mg/dL (ref 70–99)
Potassium: 3.8 mmol/L (ref 3.5–5.1)
Sodium: 137 mmol/L (ref 135–145)

## 2021-12-17 NOTE — Therapy (Signed)
[place] [facility] ?[place], [place], [address] ?Phone: [phone]   Fax:  [phone] ? ?Speech Language Pathology Treatment ? ?Patient Details  ?Name: Garrett Hunter ?MRN: [id number] ?Date of Birth: 08-Oct-1945 ?Referring Provider (SLP): Derinda Late ? ? ?Encounter Date: 12/17/2021 ? ? End of Session - 12/17/21 1620   ? ? Visit Number 17   ? Number of Visits 25   ? Date for SLP Re-Evaluation 01/01/22   ? Authorization Type Medicare   ? Authorization Time Period 10/08/2021 thru 01/01/2022   ? Authorization - Visit Number 7   ? Progress Note Due on Visit 10   ? SLP Start Time 1300   ? SLP Stop Time  1400   ? SLP Time Calculation (min) 60 min   ? Activity Tolerance Patient tolerated treatment well   ? ?  ?  ? ?  ? ? ?Past Medical History:  ?Diagnosis Date  ? AAA (abdominal aortic aneurysm)   ? thoracoabdominal aortic aneursym, s/p right ilio-femoral bypass 10/12/16; 3V FEVAR with SMA, RRA, and LRA stenting 11/16/16 by Dr. Sammuel Hines [facility])  ? Benign neoplasm of colon   ? patient not sure  ? CHF (congestive heart failure) ([place])   ? EF 45-50%  ? Chronic airway obstruction, not elsewhere classified   ? pt denies  ? Coronary artery disease   ? Inferior MI in 1996 treated with TPA. Cardiac cath in 2011 at [facility] showed chronic occlusion of the proximal RCA and proximal left circumflex without obstructive disease in the LAD. Non-ST elevation myocardial infarction in October 2013 while on vacation in [place]. Cardiac catheterization showed plaque rupture in the proximal ramus with thrombus. He had balloon angioplasty done. 01/28/21 cath   ? Coronary atherosclerosis of native coronary artery   ? Hepatitis A   ? teenager, no longer a problem  ? Hyperlipidemia   ? Intolerance to statins.  ? Hypertension   ? MI (myocardial infarction) ([place])   ? x 3 - last one 2013  ? Renal artery stenosis (HCC)   ? s/p bilateral renal artery angioplasty 07/26/18  ? Stroke [facility]) 01/2021  ?  Ventricular tachycardia   ? s/p cardioverson 01/27/21  ? ? ?Past Surgical History:  ?Procedure Laterality Date  ? ABDOMINAL AORTIC ANEURYSM REPAIR    ? 11/16/2016  ? ANGIOPLASTY    ? CARDIAC CATHETERIZATION  2011  ? CARDIAC CATHETERIZATION  2/14  ? [facility]; 95% lesion in the ramus intermedius.   ? CATARACT EXTRACTION W/PHACO Left 12/19/2018  ? Procedure: CATARACT EXTRACTION PHACO AND INTRAOCULAR LENS PLACEMENT ([place])  LEFT;  Surgeon: Eulogio Bear, MD;  Location: [place];  Service: Ophthalmology;  Laterality: Left;  ? CATARACT EXTRACTION W/PHACO Right 03/06/2019  ? Procedure: CATARACT EXTRACTION PHACO AND INTRAOCULAR LENS PLACEMENT (IOC)RIGHT;  Surgeon: Eulogio Bear, MD;  Location: [place];  Service: Ophthalmology;  Laterality: Right;  ? COLONOSCOPY WITH PROPOFOL N/A 03/20/2021  ? Procedure: COLONOSCOPY WITH PROPOFOL;  Surgeon: Lin Landsman, MD;  Location: [facility] ENDOSCOPY;  Service: Gastroenterology;  Laterality: N/A;  ? CORONARY ANGIOPLASTY  1996  ? [facility] x1  ? CORONARY ANGIOPLASTY  2/14  ? Severe restenosis in the ostial ramus. Status post angioplasty and drug-eluting stent placement with a 2.5 x 18 mm Xience drug-eluting stent  ? ESOPHAGOGASTRODUODENOSCOPY N/A 08/03/2018  ? Procedure: ESOPHAGOGASTRODUODENOSCOPY (EGD);  Surgeon: Lin Landsman, MD;  Location: [facility] ENDOSCOPY;  Service: Gastroenterology;  Laterality: N/A;  ? ESOPHAGOGASTRODUODENOSCOPY (EGD)  WITH PROPOFOL N/A 11/03/2018  ? Procedure: ESOPHAGOGASTRODUODENOSCOPY (EGD) WITH PROPOFOL;  Surgeon: Lin Landsman, MD;  Location: [facility] ENDOSCOPY;  Service: Gastroenterology;  Laterality: N/A;  ? ESOPHAGOGASTRODUODENOSCOPY (EGD) WITH PROPOFOL N/A 12/06/2018  ? Procedure: ESOPHAGOGASTRODUODENOSCOPY (EGD) WITH PROPOFOL;  Surgeon: Lucilla Lame, MD;  Location: [facility] ENDOSCOPY;  Service: Endoscopy;  Laterality: N/A;  ? ESOPHAGOGASTRODUODENOSCOPY (EGD) WITH PROPOFOL N/A 03/19/2021  ? Procedure: ESOPHAGOGASTRODUODENOSCOPY (EGD) WITH  PROPOFOL;  Surgeon: Lin Landsman, MD;  Location: [facility] ENDOSCOPY;  Service: Gastroenterology;  Laterality: N/A;  ? ICD IMPLANT N/A 05/14/2021  ? Procedure: ICD IMPLANT;  Surgeon: [name], MD;  Location: [place] CV LAB;  Service: Cardiovascular;  Laterality: N/A;  ? IR CT HEAD LTD  01/30/2021  ? IR PERCUTANEOUS ART THROMBECTOMY/INFUSION INTRACRANIAL INC DIAG ANGIO  01/30/2021  ? LEFT HEART CATH AND CORONARY ANGIOGRAPHY N/A 01/28/2021  ? Procedure: LEFT HEART CATH AND CORONARY ANGIOGRAPHY;  Surgeon: Nelva Bush, MD;  Location: [place] CV LAB;  Service: Cardiovascular;  Laterality: N/A;  ? RADIOLOGY WITH ANESTHESIA N/A 01/30/2021  ? Procedure: IR WITH ANESTHESIA;  Surgeon: Luanne Bras, MD;  Location: [place];  Service: Radiology;  Laterality: N/A;  ? TONSILLECTOMY AND ADENOIDECTOMY    ? TRANSCAROTID ARTERY REVASCULARIZATION?  Left 02/07/2021  ? Procedure: TRANSCAROTID ARTERY REVASCULARIZATION LEFT;  Surgeon: [name], MD;  Location: [facility] OR;  Service: Vascular;  Laterality: Left;  ? ? ?There were no vitals filed for this visit. ? ? Subjective Assessment - 12/17/21 1618   ? ? Subjective "my pacemaker sent message that I was out of rythm"   ? Currently in Pain? No/denies   ? ?  ?  ? ?  ? ? ? ? ? ? ? ? ADULT SLP TREATMENT - 12/17/21 0001   ? ?  ? Treatment Provided  ? Treatment provided Cognitive-Linquistic   ?  ? Cognitive-Linquistic Treatment  ? 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 Skilled Treatment Skilled treatment session focused on pt's language impairments, specifically word finding, written language and apraxia of speech.  ? ?SLP facilitated session by providing the following interventions:     ? ?Written Language: With structure for noun and verb, pt was able to create simple basic verbal sentences related to his family; He required moderate assistance for writing this sentence d/t grammatical errors and spelling errors; with moderate  verbal assistance, information was added to make sentence a complex sentence; pt struggled with writing down the generated sentence and required direct instruction to write down what he just said     ? ? ?Apraxia of Speech: When proofreading generated sentences pt demonstrated slow rate d/t concentration   ? ?  ?  ? ?  ? ? ? SLP Education - 12/17/21 1619   ? ? Education Details word finding strategies, grammar   ? Person(s) Educated Patient   ? Methods Explanation;Demonstration;Verbal cues   ? Comprehension Verbalized understanding;Returned demonstration;Need further instruction   ? ?  ?  ? ?  ? ? ? SLP Short Term Goals - 11/19/21 0723   ? ?  ? SLP SHORT TERM GOAL #1  ? Title Pt will produce sentences in response to a question (e.g. what?s your favorite holiday and why) with appropriate articulation at 75% accuracy given frequent moderate phonemic placement cues.   ? Baseline goal met, revised to reflection progress   ? Time 10   ? Period --   sessions  ? Status Revised   ?  ? SLP SHORT  TERM GOAL #2  ? Title Pt will expressively produce 4+ word sentence in response to situation/picture with 50% accuracy given moderate cues in order to increase ability to communicate basic wants needs.   ? Time --   10  ? Period --   sessions  ? Status On-going   ?  ? SLP SHORT TERM GOAL #3  ? Title Pt will write grammatically correct 5 word sentence about a basic picture with minimal assistance.   ? Baseline goal met, revised to reflect progress   ? Time 10   ? Period --   sessions  ? Status Revised   ?  ? SLP SHORT TERM GOAL #4  ? Title Pt will initiate use of compensatory strategy to repair moments of communication breakdown 2 times in a session.   ? Baseline new goal   ? Time 10   ? Period --   sessions  ? Status New   ? ?  ?  ? ?  ? ? ? SLP Long Term Goals - 11/19/21 0725   ? ?  ? SLP LONG TERM GOAL #1  ? Title Pt will use functional communication skills for social interaction (e.g., greetings,  social etiquette, and short  questions/simple sentences) with both familiar and unfamiliar  partners with 90% success.   ? Target Date 01/01/22   ? ?  ?  ? ?  ? ? ? Plan - 12/17/21 1620   ? ? Clinical Impression Statement Pt continues to

## 2021-12-22 ENCOUNTER — Ambulatory Visit: Payer: Medicare Other | Admitting: Speech Pathology

## 2021-12-22 ENCOUNTER — Other Ambulatory Visit: Payer: Self-pay

## 2021-12-22 DIAGNOSIS — R4701 Aphasia: Secondary | ICD-10-CM | POA: Diagnosis not present

## 2021-12-22 DIAGNOSIS — R482 Apraxia: Secondary | ICD-10-CM

## 2021-12-22 DIAGNOSIS — I63412 Cerebral infarction due to embolism of left middle cerebral artery: Secondary | ICD-10-CM

## 2021-12-23 ENCOUNTER — Telehealth: Payer: Self-pay | Admitting: Neurology

## 2021-12-23 NOTE — Telephone Encounter (Signed)
Medicare/supp plan no Roxanna Mew, sent Butch Penny a message she will reach out to the patient to schedule.  ?

## 2021-12-23 NOTE — Therapy (Signed)
[place] [facility] ?[place], [place], [address] ?Phone: [phone]   Fax:  [phone] ? ?Speech Language Pathology Treatment ? ?Patient Details  ?Name: Garrett Hunter ?MRN: [id number] ?Date of Birth: [birth year] ?Referring Provider (SLP): Derinda Late ? ? ?Encounter Date: 12/22/2021 ? ? End of Session - 12/23/21 1926   ? ? Visit Number 18   ? Number of Visits 25   ? Date for SLP Re-Evaluation 01/01/22   ? Authorization Type Medicare   ? Authorization Time Period 10/08/2021 thru 01/01/2022   ? Authorization - Visit Number 8   ? Progress Note Due on Visit 10   ? SLP Start Time 1300   ? SLP Stop Time  1400   ? SLP Time Calculation (min) 60 min   ? Activity Tolerance Patient tolerated treatment well   ? ?  ?  ? ?  ? ? ?Past Medical History:  ?Diagnosis Date  ? AAA (abdominal aortic aneurysm)   ? thoracoabdominal aortic aneursym, s/p right ilio-femoral bypass 10/12/16; 3V FEVAR with SMA, RRA, and LRA stenting 11/16/16 by Dr. Sammuel Hines [facility])  ? Benign neoplasm of colon   ? patient not sure  ? CHF (congestive heart failure) ([place])   ? EF 45-50%  ? Chronic airway obstruction, not elsewhere classified   ? pt denies  ? Coronary artery disease   ? Inferior MI in 1996 treated with TPA. Cardiac cath in 2011 at [facility] showed chronic occlusion of the proximal RCA and proximal left circumflex without obstructive disease in the LAD. Non-ST elevation myocardial infarction in October 2013 while on vacation in [place]. Cardiac catheterization showed plaque rupture in the proximal ramus with thrombus. He had balloon angioplasty done. 01/28/21 cath   ? Coronary atherosclerosis of native coronary artery   ? Hepatitis A   ? teenager, no longer a problem  ? Hyperlipidemia   ? Intolerance to statins.  ? Hypertension   ? MI (myocardial infarction) ([place])   ? x 3 - last one 2013  ? Renal artery stenosis (HCC)   ? s/p bilateral renal artery angioplasty 07/26/18  ? Stroke [facility]) 01/2021  ?  Ventricular tachycardia   ? s/p cardioverson 01/27/21  ? ? ?Past Surgical History:  ?Procedure Laterality Date  ? ABDOMINAL AORTIC ANEURYSM REPAIR    ? 11/16/2016  ? ANGIOPLASTY    ? CARDIAC CATHETERIZATION  2011  ? CARDIAC CATHETERIZATION  2/14  ? [facility]; 95% lesion in the ramus intermedius.   ? CATARACT EXTRACTION W/PHACO Left 12/19/2018  ? Procedure: CATARACT EXTRACTION PHACO AND INTRAOCULAR LENS PLACEMENT ([place])  LEFT;  Surgeon: Eulogio Bear, MD;  Location: [place];  Service: Ophthalmology;  Laterality: Left;  ? CATARACT EXTRACTION W/PHACO Right 03/06/2019  ? Procedure: CATARACT EXTRACTION PHACO AND INTRAOCULAR LENS PLACEMENT (I[facility])RIGHT;  Surgeon: Eulogio Bear, MD;  Location: [place];  Service: Ophthalmology;  Laterality: Right;  ? COLONOSCOPY WITH PROPOFOL N/A 03/20/2021  ? Procedure: COLONOSCOPY WITH PROPOFOL;  Surgeon: Lin Landsman, MD;  Location: [facility] ENDOSCOPY;  Service: Gastroenterology;  Laterality: N/A;  ? CORONARY ANGIOPLASTY  1996  ? [facility] x1  ? CORONARY ANGIOPLASTY  2/14  ? Severe restenosis in the ostial ramus. Status post angioplasty and drug-eluting stent placement with a 2.5 x 18 mm Xience drug-eluting stent  ? ESOPHAGOGASTRODUODENOSCOPY N/A 08/03/2018  ? Procedure: ESOPHAGOGASTRODUODENOSCOPY (EGD);  Surgeon: Lin Landsman, MD;  Location: [facility] ENDOSCOPY;  Service: Gastroenterology;  Laterality: N/A;  ? ESOPHAGOGASTRODUODENOSCOPY (EGD)  WITH PROPOFOL N/A 11/03/2018  ? Procedure: ESOPHAGOGASTRODUODENOSCOPY (EGD) WITH PROPOFOL;  Surgeon: Lin Landsman, MD;  Location: [facility] ENDOSCOPY;  Service: Gastroenterology;  Laterality: N/A;  ? ESOPHAGOGASTRODUODENOSCOPY (EGD) WITH PROPOFOL N/A 12/06/2018  ? Procedure: ESOPHAGOGASTRODUODENOSCOPY (EGD) WITH PROPOFOL;  Surgeon: Lucilla Lame, MD;  Location: [facility] ENDOSCOPY;  Service: Endoscopy;  Laterality: N/A;  ? ESOPHAGOGASTRODUODENOSCOPY (EGD) WITH PROPOFOL N/A 03/19/2021  ? Procedure: ESOPHAGOGASTRODUODENOSCOPY (EGD) WITH  PROPOFOL;  Surgeon: Lin Landsman, MD;  Location: [facility] ENDOSCOPY;  Service: Gastroenterology;  Laterality: N/A;  ? ICD IMPLANT N/A 05/14/2021  ? Procedure: ICD IMPLANT;  Surgeon: [name], MD;  Location: [place] CV LAB;  Service: Cardiovascular;  Laterality: N/A;  ? IR CT HEAD LTD  01/30/2021  ? IR PERCUTANEOUS ART THROMBECTOMY/INFUSION INTRACRANIAL INC DIAG ANGIO  01/30/2021  ? LEFT HEART CATH AND CORONARY ANGIOGRAPHY N/A 01/28/2021  ? Procedure: LEFT HEART CATH AND CORONARY ANGIOGRAPHY;  Surgeon: Nelva Bush, MD;  Location: [place] CV LAB;  Service: Cardiovascular;  Laterality: N/A;  ? RADIOLOGY WITH ANESTHESIA N/A 01/30/2021  ? Procedure: IR WITH ANESTHESIA;  Surgeon: Luanne Bras, MD;  Location: [place];  Service: Radiology;  Laterality: N/A;  ? TONSILLECTOMY AND ADENOIDECTOMY    ? TRANSCAROTID ARTERY REVASCULARIZATION?  Left 02/07/2021  ? Procedure: TRANSCAROTID ARTERY REVASCULARIZATION LEFT;  Surgeon: [name], MD;  Location: [facility] OR;  Service: Vascular;  Laterality: Left;  ? ? ?There were no vitals filed for this visit. ? ? Subjective Assessment - 12/23/21 1916   ? ? Subjective "working on taxes"   ? Currently in Pain? No/denies   ? ?  ?  ? ?  ? ? ? ? ? ? ? ? ADULT SLP TREATMENT - 12/23/21 0001   ? ?  ? Treatment Provided  ? Treatment provided Cognitive-Linquistic   ?  ? Cognitive-Linquistic Treatment  ? 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 Skilled Treatment Skilled treatment session focused on pt's language impairments, specifically word finding, written language and apraxia of speech. SLP facilitated session by providing the following interventions:    Written Language: With structure for noun and verb, pt was able to create simple basic verbal sentences related to his family; He required moderate assistance for writing this sentence d/t grammatical errors and spelling errors; with moderate verbal assistance, information was added to  make sentence a complex sentence; pt struggled with writing down the generated sentence and required direct instruction to write down what he just said    Apraxia of Speech: When proofreading generated sentences pt demonstrated slow rate d/t concentration   ? ?  ?  ? ?  ? ? ? SLP Education - 12/23/21 1925   ? ? Education Details improvement with use of strategies during moments of word finding deficits   ? Person(s) Educated Patient   ? Methods Explanation;Demonstration;Verbal cues   ? Comprehension Verbalized understanding;Returned demonstration;Need further instruction   ? ?  ?  ? ?  ? ? ? SLP Short Term Goals - 11/19/21 0723   ? ?  ? SLP SHORT TERM GOAL #1  ? Title Pt will produce sentences in response to a question (e.g. what?s your favorite holiday and why) with appropriate articulation at 75% accuracy given frequent moderate phonemic placement cues.   ? Baseline goal met, revised to reflection progress   ? Time 10   ? Period --   sessions  ? Status Revised   ?  ? SLP SHORT TERM GOAL #2  ? Title Pt  will expressively produce 4+ word sentence in response to situation/picture with 50% accuracy given moderate cues in order to increase ability to communicate basic wants needs.   ? Time --   10  ? Period --   sessions  ? Status On-going   ?  ? SLP SHORT TERM GOAL #3  ? Title Pt will write grammatically correct 5 word sentence about a basic picture with minimal assistance.   ? Baseline goal met, revised to reflect progress   ? Time 10   ? Period --   sessions  ? Status Revised   ?  ? SLP SHORT TERM GOAL #4  ? Title Pt will initiate use of compensatory strategy to repair moments of communication breakdown 2 times in a session.   ? Baseline new goal   ? Time 10   ? Period --   sessions  ? Status New   ? ?  ?  ? ?  ? ? ? SLP Long Term Goals - 11/19/21 0725   ? ?  ? SLP LONG TERM GOAL #1  ? Title Pt will use functional communication skills for social interaction (e.g., greetings,  social etiquette, and short  questions/simple sentences) with both familiar and unfamiliar  partners with 90% success.   ? Target Date 01/01/22   ? ?  ?  ? ?  ? ? ? Plan - 12/23/21 1927   ? ? Clinical Impression Statement Pt continues to pres

## 2021-12-24 ENCOUNTER — Ambulatory Visit: Payer: Medicare Other | Admitting: Speech Pathology

## 2021-12-24 ENCOUNTER — Other Ambulatory Visit: Payer: Self-pay

## 2021-12-24 DIAGNOSIS — R4701 Aphasia: Secondary | ICD-10-CM | POA: Diagnosis not present

## 2021-12-24 DIAGNOSIS — I63412 Cerebral infarction due to embolism of left middle cerebral artery: Secondary | ICD-10-CM

## 2021-12-24 DIAGNOSIS — R482 Apraxia: Secondary | ICD-10-CM

## 2021-12-25 NOTE — Therapy (Signed)
[facility] ?[place], [place], [address] ?Phone: [phone]   Fax:  [phone] ? ?Speech Language Pathology Treatment ? ?Patient Details  ?Name: Garrett Hunter ?MRN: [id number] ?Date of Birth: 10/14/1945 ?Referring Provider (SLP): Derinda Late ? ? ?Encounter Date: 12/24/2021 ? ? End of Session - 12/25/21 1551   ? ? Visit Number 19   ? Number of Visits 25   ? Date for SLP Re-Evaluation 01/01/22   ? Authorization Type Medicare   ? Authorization Time Period 10/08/2021 thru 01/01/2022   ? Authorization - Visit Number 9   ? Progress Note Due on Visit 10   ? SLP Start Time 1300   ? SLP Stop Time  1400   ? SLP Time Calculation (min) 60 min   ? Activity Tolerance Patient tolerated treatment well   ? ?  ?  ? ?  ? ? ?Past Medical History:  ?Diagnosis Date  ? AAA (abdominal aortic aneurysm)   ? thoracoabdominal aortic aneursym, s/p right ilio-femoral bypass 10/12/16; 3V FEVAR with SMA, RRA, and LRA stenting 11/16/16 by Dr. Sammuel Hines [facility])  ? Benign neoplasm of colon   ? patient not sure  ? CHF (congestive heart failure) ([place])   ? EF 45-50%  ? Chronic airway obstruction, not elsewhere classified   ? pt denies  ? Coronary artery disease   ? Inferior MI in 1996 treated with TPA. Cardiac cath in 2011 at [facility] showed chronic occlusion of the proximal RCA and proximal left circumflex without obstructive disease in the LAD. Non-ST elevation myocardial infarction in October 2013 while on vacation in [place]. Cardiac catheterization showed plaque rupture in the proximal ramus with thrombus. He had balloon angioplasty done. 01/28/21 cath   ? Coronary atherosclerosis of native coronary artery   ? Hepatitis A   ? teenager, no longer a problem  ? Hyperlipidemia   ? Intolerance to statins.  ? Hypertension   ? MI (myocardial infarction) ([place])   ? x 3 - last one 2013  ? Renal artery stenosis (HCC)   ? s/p bilateral renal artery angioplasty 07/26/18  ? Stroke [facility]) 01/2021  ?  Ventricular tachycardia   ? s/p cardioverson 01/27/21  ? ? ?Past Surgical History:  ?Procedure Laterality Date  ? ABDOMINAL AORTIC ANEURYSM REPAIR    ? 11/16/2016  ? ANGIOPLASTY    ? CARDIAC CATHETERIZATION  2011  ? CARDIAC CATHETERIZATION  2/14  ? [facility]; 95% lesion in the ramus intermedius.   ? CATARACT EXTRACTION W/PHACO Left 12/19/2018  ? Procedure: CATARACT EXTRACTION PHACO AND INTRAOCULAR LENS PLACEMENT ([place])  LEFT;  Surgeon: Eulogio Bear, MD;  Location: [place];  Service: Ophthalmology;  Laterality: Left;  ? CATARACT EXTRACTION W/PHACO Right 03/06/2019  ? Procedure: CATARACT EXTRACTION PHACO AND INTRAOCULAR LENS PLACEMENT (IOC)RIGHT;  Surgeon: Eulogio Bear, MD;  Location: [place];  Service: Ophthalmology;  Laterality: Right;  ? COLONOSCOPY WITH PROPOFOL N/A 03/20/2021  ? Procedure: COLONOSCOPY WITH PROPOFOL;  Surgeon: Lin Landsman, MD;  Location: [facility] ENDOSCOPY;  Service: Gastroenterology;  Laterality: N/A;  ? CORONARY ANGIOPLASTY  1996  ? [facility] x1  ? CORONARY ANGIOPLASTY  2/14  ? Severe restenosis in the ostial ramus. Status post angioplasty and drug-eluting stent placement with a 2.5 x 18 mm Xience drug-eluting stent  ? ESOPHAGOGASTRODUODENOSCOPY N/A 08/03/2018  ? Procedure: ESOPHAGOGASTRODUODENOSCOPY (EGD);  Surgeon: Lin Landsman, MD;  Location: [facility] ENDOSCOPY;  Service: Gastroenterology;  Laterality: N/A;  ? ESOPHAGOGASTRODUODENOSCOPY (EGD)  WITH PROPOFOL N/A 11/03/2018  ? Procedure: ESOPHAGOGASTRODUODENOSCOPY (EGD) WITH PROPOFOL;  Surgeon: Lin Landsman, MD;  Location[facility] ENDOSCOPY;  Service: Gastroenterology;  Laterality: N/A;  ? ESOPHAGOGASTRODUODENOSCOPY (EGD) WITH PROPOFOL N/A 12/06/2018  ? Procedure: ESOPHAGOGASTRODUODENOSCOPY (EGD) WITH PROPOFOL;  Surgeon: Lucilla Lame, MD;  Location: [facility] ENDOSCOPY;  Service: Endoscopy;  Laterality: N/A;  ? ESOPHAGOGASTRODUODENOSCOPY (EGD) WITH PROPOFOL N/A 03/19/2021  ? Procedure: ESOPHAGOGASTRODUODENOSCOPY (EGD) WITH  PROPOFOL;  Surgeon: Lin Landsman, MD;  Location: [facility] ENDOSCOPY;  Service: Gastroenterology;  Laterality: N/A;  ? ICD IMPLANT N/A 05/14/2021  ? Procedure: ICD IMPLANT;  Surgeon: [name], MD;  Location: [place] CV LAB;  Service: Cardiovascular;  Laterality: N/A;  ? IR CT HEAD LTD  01/30/2021  ? IR PERCUTANEOUS ART THROMBECTOMY/INFUSION INTRACRANIAL INC DIAG ANGIO  01/30/2021  ? LEFT HEART CATH AND CORONARY ANGIOGRAPHY N/A 01/28/2021  ? Procedure: LEFT HEART CATH AND CORONARY ANGIOGRAPHY;  Surgeon: Nelva Bush, MD;  Location: [place] CV LAB;  Service: Cardiovascular;  Laterality: N/A;  ? RADIOLOGY WITH ANESTHESIA N/A 01/30/2021  ? Procedure: IR WITH ANESTHESIA;  Surgeon: Luanne Bras, MD;  Location: [place];  Service: Radiology;  Laterality: N/A;  ? TONSILLECTOMY AND ADENOIDECTOMY    ? TRANSCAROTID ARTERY REVASCULARIZATION?  Left 02/07/2021  ? Procedure: TRANSCAROTID ARTERY REVASCULARIZATION LEFT;  Surgeon: [name], MD;  Location: [facility] OR;  Service: Vascular;  Laterality: Left;  ? ? ?There were no vitals filed for this visit. ? ? Subjective Assessment - 12/25/21 1548   ? ? Subjective "still working on taxes"   ? Currently in Pain? No/denies   ? ?  ?  ? ?  ? ? ? ? ? ? ? ? ADULT SLP TREATMENT - 12/25/21 0001   ? ?  ? Treatment Provided  ? Treatment provided Cognitive-Linquistic   ?  ? Cognitive-Linquistic Treatment  ? 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 Skilled Treatment Skilled treatment session focused on pt's language impairments, specifically word finding, written language and apraxia of speech. SLP facilitated session by providing the following interventions:    Written Language: With structure for noun and verb, pt was able to create simple basic verbal sentences related to his family; He required minimal assistance for writing this sentence d/t grammatical errors and spelling errors; with moderate verbal assistance, information was  added to make sentence a complex sentence; pt struggled with writing down the generated sentence and required direct instruction to write down what he just said    Apraxia of Speech: When proofreading generated sentences pt demonstrated slow rate d/t concentration   ? ?  ?  ? ?  ? ? ? SLP Education - 12/25/21 1551   ? ? Education Details strategies to use during moments of communication breakdowns   ? Person(s) Educated Patient   ? Methods Explanation;Demonstration;Verbal cues;Handout   ? Comprehension Verbalized understanding;Returned demonstration;Need further instruction   ? ?  ?  ? ?  ? ? ? SLP Short Term Goals - 11/19/21 0723   ? ?  ? SLP SHORT TERM GOAL #1  ? Title Pt will produce sentences in response to a question (e.g. what?s your favorite holiday and why) with appropriate articulation at 75% accuracy given frequent moderate phonemic placement cues.   ? Baseline goal met, revised to reflection progress   ? Time 10   ? Period --   sessions  ? Status Revised   ?  ? SLP SHORT TERM GOAL #2  ? Title Pt will expressively  produce 4+ word sentence in response to situation/picture with 50% accuracy given moderate cues in order to increase ability to communicate basic wants needs.   ? Time --   10  ? Period --   sessions  ? Status On-going   ?  ? SLP SHORT TERM GOAL #3  ? Title Pt will write grammatically correct 5 word sentence about a basic picture with minimal assistance.   ? Baseline goal met, revised to reflect progress   ? Time 10   ? Period --   sessions  ? Status Revised   ?  ? SLP SHORT TERM GOAL #4  ? Title Pt will initiate use of compensatory strategy to repair moments of communication breakdown 2 times in a session.   ? Baseline new goal   ? Time 10   ? Period --   sessions  ? Status New   ? ?  ?  ? ?  ? ? ? SLP Long Term Goals - 11/19/21 0725   ? ?  ? SLP LONG TERM GOAL #1  ? Title Pt will use functional communication skills for social interaction (e.g., greetings,  social etiquette, and short  questions/simple sentences) with both familiar and unfamiliar  partners with 90% success.   ? Target Date 01/01/22   ? ?  ?  ? ?  ? ? ? Plan - 12/25/21 1552   ? ? Clinical Impression Statement Pt continues to prese

## 2021-12-29 ENCOUNTER — Ambulatory Visit: Payer: Medicare Other | Admitting: Speech Pathology

## 2021-12-29 ENCOUNTER — Telehealth: Payer: Self-pay

## 2021-12-29 ENCOUNTER — Other Ambulatory Visit: Payer: Self-pay

## 2021-12-29 DIAGNOSIS — R482 Apraxia: Secondary | ICD-10-CM

## 2021-12-29 DIAGNOSIS — R4701 Aphasia: Secondary | ICD-10-CM

## 2021-12-29 DIAGNOSIS — I63412 Cerebral infarction due to embolism of left middle cerebral artery: Secondary | ICD-10-CM

## 2021-12-29 NOTE — Telephone Encounter (Signed)
Biotron[employer] alert received for VT1 zone and ATP X1 therapy delivered on 3/24 & 3/25. Patient called and was asymptomatic and has felt well. Reports compliance with medications on file. Advised I will forward to Dr. Quentin Ore and will call with any recommended changes. Voiced understanding.  ? ?Advised of [place] DMV driving restrictions x6 months and shock plan. Verbalized understanding. ? ? ? ? ? ? ? ? ? ? ? ? ?

## 2021-12-30 ENCOUNTER — Ambulatory Visit (HOSPITAL_COMMUNITY)
Admission: RE | Admit: 2021-12-30 | Discharge: 2021-12-30 | Disposition: A | Discharge status: Home / Self Care | Payer: Medicare Other | Source: Ambulatory Visit | Attending: Neurology | Admitting: Neurology

## 2021-12-30 DIAGNOSIS — I6522 Occlusion and stenosis of left carotid artery: Secondary | ICD-10-CM | POA: Insufficient documentation

## 2021-12-30 DIAGNOSIS — I69320 Aphasia following cerebral infarction: Secondary | ICD-10-CM | POA: Diagnosis present

## 2021-12-30 NOTE — Progress Notes (Signed)
Carotid duplex has been completed.  ? ?Preliminary results in CV Proc.  ? ?[name] [name] ?12/30/2021 10:04 AM    ?

## 2021-12-31 ENCOUNTER — Ambulatory Visit: Payer: Medicare Other | Admitting: Speech Pathology

## 2021-12-31 DIAGNOSIS — R4701 Aphasia: Secondary | ICD-10-CM

## 2021-12-31 DIAGNOSIS — R482 Apraxia: Secondary | ICD-10-CM

## 2021-12-31 DIAGNOSIS — I63412 Cerebral infarction due to embolism of left middle cerebral artery: Secondary | ICD-10-CM

## 2021-12-31 NOTE — Therapy (Addendum)
[facility] ?[place], [place], [address] ?Phone: [phone]   Fax:  [phone] ? ?Speech Language Pathology Treatment ? ?Speech Therapy Progress Note ? ? ?Dates of reporting period  11/17/2021   to   12/29/2021 ? ? RE-CERTIFICATION REQUEST ? ? ? ?Patient Details  ?Name: Garrett Hunter ?MRN: [id number] ?Date of Birth: [birth year] ?Referring Provider (SLP): Derinda Late ? ? ?Encounter Date: 12/29/2021 ? ? End of Session - 12/31/21 0714   ? ? Visit Number 20   ? Number of Visits 44   ? Date for SLP Re-Evaluation 03/25/22   ? Authorization Type Medicare   ? Authorization Time Period 12/29/2021 thru 03/25/2022   ? Authorization - Visit Number 10   ? Progress Note Due on Visit 10   ? SLP Start Time 1300   ? SLP Stop Time  1400   ? SLP Time Calculation (min) 60 min   ? Activity Tolerance Patient tolerated treatment well   ? ?  ?  ? ?  ? ? ?Past Medical History:  ?Diagnosis Date  ? AAA (abdominal aortic aneurysm)   ? thoracoabdominal aortic aneursym, s/p right ilio-femoral bypass 10/12/16; 3V FEVAR with SMA, RRA, and LRA stenting 11/16/16 by Dr. Sammuel Hines [facility])  ? Benign neoplasm of colon   ? patient not sure  ? CHF (congestive heart failure) ([place])   ? EF 45-50%  ? Chronic airway obstruction, not elsewhere classified   ? pt denies  ? Coronary artery disease   ? Inferior MI in 1996 treated with TPA. Cardiac cath in 2011 at [facility] showed chronic occlusion of the proximal RCA and proximal left circumflex without obstructive disease in the LAD. Non-ST elevation myocardial infarction in October 2013 while on vacation in [place]. Cardiac catheterization showed plaque rupture in the proximal ramus with thrombus. He had balloon angioplasty done. 01/28/21 cath   ? Coronary atherosclerosis of native coronary artery   ? Hepatitis A   ? teenager, no longer a problem  ? Hyperlipidemia   ? Intolerance to statins.  ? Hypertension   ? MI (myocardial infarction) ([place])   ? x 3  - last one 2013  ? Renal artery stenosis (HCC)   ? s/p bilateral renal artery angioplasty 07/26/18  ? Stroke [facility]) 01/2021  ? Ventricular tachycardia   ? s/p cardioverson 01/27/21  ? ? ?Past Surgical History:  ?Procedure Laterality Date  ? ABDOMINAL AORTIC ANEURYSM REPAIR    ? 11/16/2016  ? ANGIOPLASTY    ? CARDIAC CATHETERIZATION  2011  ? CARDIAC CATHETERIZATION  2/14  ? [facility]; 95% lesion in the ramus intermedius.   ? CATARACT EXTRACTION W/PHACO Left 12/19/2018  ? Procedure: CATARACT EXTRACTION PHACO AND INTRAOCULAR LENS PLACEMENT ([place])  LEFT;  Surgeon: Eulogio Bear, MD;  Location: [place];  Service: Ophthalmology;  Laterality: Left;  ? CATARACT EXTRACTION W/PHACO Right 03/06/2019  ? Procedure: CATARACT EXTRACTION PHACO AND INTRAOCULAR LENS PLACEMENT (IOC)RIGHT;  Surgeon: Eulogio Bear, MD;  Location: [place];  Service: Ophthalmology;  Laterality: Right;  ? COLONOSCOPY WITH PROPOFOL N/A 03/20/2021  ? Procedure: COLONOSCOPY WITH PROPOFOL;  Surgeon: Lin Landsman, MD;  Location: [facility] ENDOSCOPY;  Service: Gastroenterology;  Laterality: N/A;  ? CORONARY ANGIOPLASTY  1996  ? [facility] x1  ? CORONARY ANGIOPLASTY  2/14  ? Severe restenosis in the ostial ramus. Status post angioplasty and drug-eluting stent placement with a 2.5 x 18 mm Xience drug-eluting stent  ? ESOPHAGOGASTRODUODENOSCOPY N/A 08/03/2018  ?  Procedure: ESOPHAGOGASTRODUODENOSCOPY (EGD);  Surgeon: Lin Landsman, MD;  Location: [facility] ENDOSCOPY;  Service: Gastroenterology;  Laterality: N/A;  ? ESOPHAGOGASTRODUODENOSCOPY (EGD) WITH PROPOFOL N/A 11/03/2018  ? Procedure: ESOPHAGOGASTRODUODENOSCOPY (EGD) WITH PROPOFOL;  Surgeon: Lin Landsman, MD;  Location: [facility] ENDOSCOPY;  Service: Gastroenterology;  Laterality: N/A;  ? ESOPHAGOGASTRODUODENOSCOPY (EGD) WITH PROPOFOL N/A 12/06/2018  ? Procedure: ESOPHAGOGASTRODUODENOSCOPY (EGD) WITH PROPOFOL;  Surgeon: Lucilla Lame, MD;  Location: [facility] ENDOSCOPY;  Service: Endoscopy;   Laterality: N/A;  ? ESOPHAGOGASTRODUODENOSCOPY (EGD) WITH PROPOFOL N/A 03/19/2021  ? Procedure: ESOPHAGOGASTRODUODENOSCOPY (EGD) WITH PROPOFOL;  Surgeon: Lin Landsman, MD;  Location: [facility] ENDOSCOPY;  Service: Gastroenterology;  Laterality: N/A;  ? ICD IMPLANT N/A 05/14/2021  ? Procedure: ICD IMPLANT;  Surgeon: [name], MD;  Location: [place] CV LAB;  Service: Cardiovascular;  Laterality: N/A;  ? IR CT HEAD LTD  01/30/2021  ? IR PERCUTANEOUS ART THROMBECTOMY/INFUSION INTRACRANIAL INC DIAG ANGIO  01/30/2021  ? LEFT HEART CATH AND CORONARY ANGIOGRAPHY N/A 01/28/2021  ? Procedure: LEFT HEART CATH AND CORONARY ANGIOGRAPHY;  Surgeon: Nelva Bush, MD;  Location: [place] CV LAB;  Service: Cardiovascular;  Laterality: N/A;  ? RADIOLOGY WITH ANESTHESIA N/A 01/30/2021  ? Procedure: IR WITH ANESTHESIA;  Surgeon: Luanne Bras, MD;  Location: [place];  Service: Radiology;  Laterality: N/A;  ? TONSILLECTOMY AND ADENOIDECTOMY    ? TRANSCAROTID ARTERY REVASCULARIZATION?  Left 02/07/2021  ? Procedure: TRANSCAROTID ARTERY REVASCULARIZATION LEFT;  Surgeon: [name], MD;  Location: [facility] OR;  Service: Vascular;  Laterality: Left;  ? ? ?There were no vitals filed for this visit. ? ? Subjective Assessment - 12/31/21 0624   ? ? Subjective pt pleasant, conversant   ? Currently in Pain? No/denies   ? ?  ?  ? ?  ? ? ? ? ? ? ? ? ADULT SLP TREATMENT - 12/31/21 0001   ? ?  ? Treatment Provided  ? Treatment provided Cognitive-Linquistic   ?  ? Cognitive-Linquistic Treatment  ? 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 Skilled Treatment Skilled treatment session focused on pt's ability to implement strategies to improve listener understanding during moments of word finding difficulty. Initial tasks surrounding writing about his favorite vacation spot. Pt produced 3 semantically correct sentences with 2 grammatically correct sentences out of 4. Given that pen and paper were still  present during contextually based conversation, pt used them to write down words during moments of word finding difficulty. Very effective strategy.   ? ?  ?  ? ?  ? ? ? SLP Education - 12/31/21 0714   ? ? Education Details speech intelligibility and word finding strategies   ? Person(s) Educated Patient   ? Methods Explanation;Demonstration;Verbal cues;Handout   ? Comprehension Verbalized understanding;Returned demonstration;Need further instruction   ? ?  ?  ? ?  ? ? ? SLP Short Term Goals - 12/31/21 0715   ? ?  ? SLP SHORT TERM GOAL #1  ? Title Pt will produce sentences in response to a question (e.g. what?s your favorite holiday and why) with appropriate articulation at 75% accuracy given frequent moderate phonemic placement cues.   ? Time 10   ? Period --   sessions  ? Status On-going   ?  ? SLP SHORT TERM GOAL #2  ? Title Pt will expressively produce 4+ word sentence in response to situation/picture with 50% accuracy given moderate cues in order to increase ability to communicate basic wants needs.   ? Time 10   ?  Period --   sessions  ? Status On-going   ?  ? SLP SHORT TERM GOAL #3  ? Title Pt will write grammatically correct 5 word sentence about a basic picture with minimal assistance.   ? Time 10   ? Period --   sessions  ? Status On-going   ?  ? SLP SHORT TERM GOAL #4  ? Title Pt will initiate use of compensatory strategy to repair moments of communication breakdown 2 times in a session.   ? Time 10   ? Period --   sessions  ? Status On-going   ? ?  ?  ? ?  ? ? ? SLP Long Term Goals - 12/31/21 0716   ? ?  ? SLP LONG TERM GOAL #1  ? Title Pt will use functional communication skills for social interaction (e.g., greetings,  social etiquette, and short questions/simple sentences) with both familiar and unfamiliar  partners with 90% success.   ? Status Achieved   ?  ? SLP LONG TERM GOAL #2  ? Title Patient will report carryover of communication effectiveness strategies to reduce frustration when breakdowns  occur.   ? Baseline total assistance for use of strategies   ? Time 12   ? Period Weeks   ? Status New   ? Target Date 03/25/22   ? ?  ?  ? ?  ? ? ? Plan - 12/31/21 0714   ? ? Clinical Impression Statement Pt

## 2021-12-31 NOTE — Addendum Note (Signed)
Addended by: Stormy Fabian B on: 12/31/2021 12:56 PM ? ? Modules accepted: Orders ? ?

## 2022-01-01 ENCOUNTER — Telehealth: Payer: Self-pay

## 2022-01-01 NOTE — Telephone Encounter (Signed)
-----   Message from Gildardo Griffes, RN sent at 12/31/2021 12:14 PM EDT ----- ?Pt of Dr Leonie Man  ?----- Message ----- ?From: Star Age, MD ?Sent: 12/31/2021  10:24 AM EDT ?To: Gna-Pod 4 Results ? ?No significant narrowing of the carotid arteries was seen on carotid Doppler ultrasound.  Please notify patient.  Left carotid artery has a stent which is not significantly occluded, but should be monitored on a regular basis by Dr. Leonie Man. ? ?

## 2022-01-02 ENCOUNTER — Encounter: Payer: Self-pay | Admitting: Nurse Practitioner

## 2022-01-02 ENCOUNTER — Ambulatory Visit (INDEPENDENT_AMBULATORY_CARE_PROVIDER_SITE_OTHER): Payer: Medicare Other | Admitting: Nurse Practitioner

## 2022-01-02 VITALS — BP 110/62 | HR 62 | Ht 72.0 in | Wt 198.0 lb

## 2022-01-02 DIAGNOSIS — I5022 Chronic systolic (congestive) heart failure: Secondary | ICD-10-CM

## 2022-01-02 DIAGNOSIS — I2510 Atherosclerotic heart disease of native coronary artery without angina pectoris: Secondary | ICD-10-CM

## 2022-01-02 DIAGNOSIS — I4729 Other ventricular tachycardia: Secondary | ICD-10-CM | POA: Diagnosis not present

## 2022-01-02 DIAGNOSIS — I6330 Cerebral infarction due to thrombosis of unspecified cerebral artery: Secondary | ICD-10-CM | POA: Diagnosis not present

## 2022-01-02 DIAGNOSIS — E785 Hyperlipidemia, unspecified: Secondary | ICD-10-CM

## 2022-01-02 DIAGNOSIS — I472 Ventricular tachycardia, unspecified: Secondary | ICD-10-CM

## 2022-01-02 DIAGNOSIS — I6529 Occlusion and stenosis of unspecified carotid artery: Secondary | ICD-10-CM

## 2022-01-02 DIAGNOSIS — I255 Ischemic cardiomyopathy: Secondary | ICD-10-CM

## 2022-01-02 NOTE — Patient Instructions (Signed)
Purchase a small pad of paper to use during moments of word finding difficulty ?

## 2022-01-02 NOTE — Patient Instructions (Signed)
Medication Instructions:  ?No changes at this time. ? ?*If you need a refill on your cardiac medications before your next appointment, please call your pharmacy* ? ? ?Lab Work: ?BMET & Mag today ? ?If you have labs (blood work) drawn today and your tests are completely normal, you will receive your results only by: ?MyChart Message (if you have MyChart) OR ?A paper copy in the mail ?If you have any lab test that is abnormal or we need to change your treatment, we will call you to review the results. ? ? ?Testing/Procedures: ?None ? ? ?Follow-Up: ?At [facility] you and your health needs are our priority.  As part of our continuing mission to provide you with exceptional heart care, we have created designated Provider Care Teams.  These Care Teams include your primary Cardiologist (physician) and Advanced Practice Providers (APPs -  Physician Assistants and Nurse Practitioners) who all work together to provide you with the care you need, when you need it. ? ? ?Your next appointment:   ?2 month(s) ? ?The format for your next appointment:   ?In Person ? ?Provider:   ?Lars Mage, MD ?

## 2022-01-02 NOTE — Therapy (Signed)
[facility] ?[place], [place], [address] ?Phone: [phone]   Fax:  [phone] ? ?Speech Language Pathology Treatment ? ?Patient Details  ?Name: Garrett Hunter ?MRN: [id number] ?Date of Birth: 1945-11-03 ?Referring Provider (SLP): Derinda Late ? ? ?Encounter Date: 12/31/2021 ? ? End of Session - 01/02/22 8264   ? ? Visit Number 21   ? Number of Visits 44   ? Date for SLP Re-Evaluation 03/25/22   ? Authorization Type Medicare   ? Authorization Time Period 12/29/2021 thru 03/25/2022   ? Authorization - Visit Number 1   ? Progress Note Due on Visit 10   ? SLP Start Time 1300   ? SLP Stop Time  1400   ? SLP Time Calculation (min) 60 min   ? Activity Tolerance Patient tolerated treatment well   ? ?  ?  ? ?  ? ? ?Past Medical History:  ?Diagnosis Date  ? AAA (abdominal aortic aneurysm)   ? thoracoabdominal aortic aneursym, s/p right ilio-femoral bypass 10/12/16; 3V FEVAR with SMA, RRA, and LRA stenting 11/16/16 by Dr. Sammuel Hines [facility])  ? Benign neoplasm of colon   ? patient not sure  ? Chronic airway obstruction, not elsewhere classified   ? pt denies  ? 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 a. 01/2021 Echo: EF 45-50%, mild LVH, GrII DD, basal inf AK, nl RV fxn, mildly dil LA, triv MR. Ao root 39m.  ? Coronary artery disease   ? a. 1996 Inf MI: TPA; b. 2011 Cath: CTO pRCA/pLCX; c. 07/2012 NSTEMI/Cath (HI): plaque rupture in pRamius (stenting); d. 01/2021 Cath (in setting of VT): LM nl, LAD min irregs, RI patent stent, LCX 100p, OM2 fills via collats from D1, LPL1 - collats from dLAD, RCA 100p, RPDA - collats from Sept1/2.  ? Dilated aortic root ([place]   ? a. 01/2021 Echo: Ao root 361m  ? Gastric ulcer   ? a. 07/2018 s/p cauterization.  ? Hepatitis A   ? teenager, no longer a problem  ? Hyperlipidemia   ? Intolerance to statins.  ? Hypertension   ? Left carotid stenosis   ? a. 02/2021 s/p9 x 40 Enroute L carotid stent.  ?  MI (myocardial infarction) ([place]  ? x 3 - last one 2013  ? Renal artery stenosis ([place]  ? a. 07/2018 s/p bilateral renal artery angioplasty.  ? [facility]  ? Ventricular tachycardia   ? a. 01/2021 s/p DCCV; b. 05/2021 s/p Biotronik Acticor 7 VR-T DX AICD (ser# [id number]  ? ? ?Past Surgical History:  ?Procedure Laterality Date  ? ABDOMINAL AORTIC ANEURYSM REPAIR    ? 11/16/2016  ? ANGIOPLASTY    ? CARDIAC CATHETERIZATION  2011  ? CARDIAC CATHETERIZATION  2/14  ? [facility]; 95% lesion in the ramus intermedius.   ? CATARACT EXTRACTION W/PHACO Left 12/19/2018  ? Procedure: CATARACT EXTRACTION PHACO AND INTRAOCULAR LENS PLACEMENT (IOIsabela LEFT;  Surgeon: KiEulogio BearMD;  [place] Service: Ophthalmology;  Laterality: Left;  ? CATARACT EXTRACTION W/PHACO Right 03/06/2019  ? Procedure: CATARACT EXTRACTION PHACO AND INTRAOCULAR LENS PLACEMENT (IOC)RIGHT;  Surgeon: KiEulogio BearMD;  [place]: [place] Service: Ophthalmology;  Laterality: Right;  ? COLONOSCOPY WITH PROPOFOL N/A 03/20/2021  ? Procedure: COLONOSCOPY WITH PROPOFOL;  Surgeon: VaLin LandsmanMD;  [facility];  Service: Gastroenterology;  Laterality: N/A;  ? CORONARY ANGIOPLASTY  1996  ? [facility] x1  ? CORONARY ANGIOPLASTY  2/14  ? Severe restenosis in the ostial ramus. Status post angioplasty and drug-eluting stent placement with a 2.5 x 18 mm Xience drug-eluting stent  ? ESOPHAGOGASTRODUODENOSCOPY N/A 08/03/2018  ? Procedure: ESOPHAGOGASTRODUODENOSCOPY (EGD);  Surgeon: Lin Landsman, MD;  [facility];  Service: Gastroenterology;  Laterality: N/A;  ? ESOPHAGOGASTRODUODENOSCOPY (EGD) WITH PROPOFOL N/A 11/03/2018  ? Procedure: ESOPHAGOGASTRODUODENOSCOPY (EGD) WITH PROPOFOL;  Surgeon: Lin Landsman, MD;  Location: [facility] ENDOSCOPY;  Service: Gastroenterology;  Laterality: N/A;  ? ESOPHAGOGASTRODUODENOSCOPY (EGD) WITH PROPOFOL N/A 12/06/2018  ? Procedure: ESOPHAGOGASTRODUODENOSCOPY  (EGD) WITH PROPOFOL;  Surgeon: Lucilla Lame, MD;  Location: [facility] ENDOSCOPY;  Service: Endoscopy;  Laterality: N/A;  ? ESOPHAGOGASTRODUODENOSCOPY (EGD) WITH PROPOFOL N/A 03/19/2021  ? Procedure: ESOPHAGOGASTRODUODENOSCOPY (EGD) WITH PROPOFOL;  Surgeon: Lin Landsman, MD;  Location: [facility] ENDOSCOPY;  Service: Gastroenterology;  Laterality: N/A;  ? ICD IMPLANT N/A 05/14/2021  ? Procedure: ICD IMPLANT;  Surgeon: [name], MD;  Location: [place] CV LAB;  Service: Cardiovascular;  Laterality: N/A;  ? IR CT HEAD LTD  01/30/2021  ? IR PERCUTANEOUS ART THROMBECTOMY/INFUSION INTRACRANIAL INC DIAG ANGIO  01/30/2021  ? LEFT HEART CATH AND CORONARY ANGIOGRAPHY N/A 01/28/2021  ? Procedure: LEFT HEART CATH AND CORONARY ANGIOGRAPHY;  Surgeon: Nelva Bush, MD;  Location: [place] CV LAB;  Service: Cardiovascular;  Laterality: N/A;  ? RADIOLOGY WITH ANESTHESIA N/A 01/30/2021  ? Procedure: IR WITH ANESTHESIA;  Surgeon: Luanne Bras, MD;  Location: [place];  Service: Radiology;  Laterality: N/A;  ? TONSILLECTOMY AND ADENOIDECTOMY    ? TRANSCAROTID ARTERY REVASCULARIZATION?  Left 02/07/2021  ? Procedure: TRANSCAROTID ARTERY REVASCULARIZATION LEFT;  Surgeon: [name], MD;  Location: [facility] OR;  Service: Vascular;  Laterality: Left;  ? ? ?There were no vitals filed for this visit. ? ? Subjective Assessment - 01/01/22 2211   ? ? Subjective pt pleasant, conversant   ? Currently in Pain? No/denies   ? ?  ?  ? ?  ? ? ? ? ? ? ? ? ADULT SLP TREATMENT - 01/02/22 0001   ? ?  ? Treatment Provided  ? Treatment provided Cognitive-Linquistic   ?  ? Cognitive-Linquistic Treatment  ? 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 SLP facilitated session by having pt formulate verbal and written sentences describing the various services that pt's wife provides at her salon. When pt has pen and  paper in front of him, he frequently writes down words during moments of verbal word finding deficits.   ? ?  ?  ? ?  ? ? ? SLP Education - 01/02/22 0810   ? ? Education Details speech intelligibility and word finding strategies   ? Person(s) Educated Patient   ? Methods Explanation;Demonstration;Verbal cues;Handout   ? Comprehension Verbalized understanding;Returned demonstration;Need further instruction;Verbal cues required   ? ?  ?  ? ?  ? ? ? SLP Short Term Goals - 12/31/21 0715   ? ?  ? SLP SHORT TERM GOAL #1  ? Title Pt will produce sentences in response to a question (e.g. what?s your favorite holiday and why) with appropriate articulation at 75% accuracy given frequent moderate phonemic placement cues.   ? Time 10   ? Period --   sessions  ? Status On-going   ?  ? SLP SHORT TERM GOAL #2  ? Title Pt will expressively produce  4+ word sentence in response to situation/picture with 50% accuracy given moderate cues in order to increase ability to communicate basic wants needs.   ? Time 10   ? Period --   sessions  ? Status On-going   ?  ? SLP SHORT TERM GOAL #3  ? Title Pt will write grammatically correct 5 word sentence about a basic picture with minimal assistance.   ? Time 10   ? Period --   sessions  ? Status On-going   ?  ? SLP SHORT TERM GOAL #4  ? Title Pt will initiate use of compensatory strategy to repair moments of communication breakdown 2 times in a session.   ? Time 10   ? Period --   sessions  ? Status On-going   ? ?  ?  ? ?  ? ? ? SLP Long Term Goals - 12/31/21 0716   ? ?  ? SLP LONG TERM GOAL #1  ? Title Pt will use functional communication skills for social interaction (e.g., greetings,  social etiquette, and short questions/simple sentences) with both familiar and unfamiliar  partners with 90% success.   ? Status Achieved   ?  ? SLP LONG TERM GOAL #2  ? Title Patient will report carryover of communication effectiveness strategies to reduce frustration when breakdowns occur.   ? Baseline total  assistance for use of strategies   ? Time 12   ? Period Weeks   ? Status New   ? Target Date 03/25/22   ? ?  ?  ? ?  ? ? ? Plan - 01/02/22 0813   ? ? Clinical Impression Statement Pt continues to present

## 2022-01-02 NOTE — Progress Notes (Signed)
? ? ?Office Visit  ?  ?Patient Name: Garrett Hunter ?Date of Encounter: 01/02/2022 ? ?Primary Care Provider:  Derinda Late, MD ?Primary Cardiologist:  Kathlyn Sacramento, MD/EP: Lars Mage, MD ? ?Chief Complaint  ?  ?76 year old male with a history of CAD, ischemic cardiomyopathy, chronic heart failure with midrange ejection fraction, ventricular tachycardia status post AICD, stroke, carotid disease status post left carotid stenting, AAA status post endovascular repair, peripheral arterial disease, renal artery stenosis status post PTA, gastric ulcer status post cauterization, hypertension, hyperlipidemia, and dilated aortic root, who presents for follow-up related to CAD and cardiomyopathy. ? ?Past Medical History  ?  ?Past Medical History:  ?Diagnosis Date  ? AAA (abdominal aortic aneurysm)   ? thoracoabdominal aortic aneursym, s/p right ilio-femoral bypass 10/12/16; 3V FEVAR with SMA, RRA, and LRA stenting 11/16/16 by Dr. Sammuel Hines [facility])  ? Benign neoplasm of colon   ? patient not sure  ? Chronic airway obstruction, not elsewhere classified   ? pt denies  ? 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 a. 01/2021 Echo: EF 45-50%, mild LVH, GrII DD, basal inf AK, nl RV fxn, mildly dil LA, triv MR. Ao root 71m.  ? Coronary artery disease   ? a. 1996 Inf MI: TPA; b. 2011 Cath: CTO pRCA/pLCX; c. 07/2012 NSTEMI/Cath (HI): plaque rupture in pRamius (stenting); d. 01/2021 Cath (in setting of VT): LM nl, LAD min irregs, RI patent stent, LCX 100p, OM2 fills via collats from D1, LPL1 - collats from dLAD, RCA 100p, RPDA - collats from Sept1/2.  ? Dilated aortic root ([place]   ? a. 01/2021 Echo: Ao root 343m  ? Gastric ulcer   ? a. 07/2018 s/p cauterization.  ? Hepatitis A   ? teenager, no longer a problem  ? Hyperlipidemia   ? Intolerance to statins.  ? Hypertension   ? Left carotid stenosis   ? a. 02/2021 s/p9 x 40 Enroute L carotid stent.  ? MI (myocardial infarction) ([place]  ? x 3 - last one 2013   ? Renal artery stenosis ([place]  ? a. 07/2018 s/p bilateral renal artery angioplasty.  ? [facility]  ? Ventricular tachycardia   ? a. 01/2021 s/p DCCV; b. 05/2021 s/p Biotronik Acticor 7 VR-T DX AICD (ser# [id number]  ? ?Past Surgical History:  ?Procedure Laterality Date  ? ABDOMINAL AORTIC ANEURYSM REPAIR    ? 11/16/2016  ? ANGIOPLASTY    ? CARDIAC CATHETERIZATION  2011  ? CARDIAC CATHETERIZATION  2/14  ? [facility]; 95% lesion in the ramus intermedius.   ? CATARACT EXTRACTION W/PHACO Left 12/19/2018  ? Procedure: CATARACT EXTRACTION PHACO AND INTRAOCULAR LENS PLACEMENT (IOMarcus LEFT;  Surgeon: KiEulogio BearMD;  [place] Service: Ophthalmology;  Laterality: Left;  ? CATARACT EXTRACTION W/PHACO Right 03/06/2019  ? Procedure: CATARACT EXTRACTION PHACO AND INTRAOCULAR LENS PLACEMENT (IOC)RIGHT;  Surgeon: KiEulogio BearMD;  [place]: [place] Service: Ophthalmology;  Laterality: Right;  ? COLONOSCOPY WITH PROPOFOL N/A 03/20/2021  ? Procedure: COLONOSCOPY WITH PROPOFOL;  Surgeon: VaLin LandsmanMD;  [facility];  Service: Gastroenterology;  Laterality: N/A;  ? CORONARY ANGIOPLASTY  1996  ? [facility] x1  ? CORONARY ANGIOPLASTY  2/14  ? Severe restenosis in the ostial ramus. Status post angioplasty and drug-eluting stent placement with a 2.5 x 18 mm Xience drug-eluting stent  ? ESOPHAGOGASTRODUODENOSCOPY N/A 08/03/2018  ? Procedure: ESOPHAGOGASTRODUODENOSCOPY (EGD);  Surgeon: VaLin LandsmanMD;  Location: [facility] ENDOSCOPY;  Service: Gastroenterology;  Laterality: N/A;  ? ESOPHAGOGASTRODUODENOSCOPY (EGD) WITH PROPOFOL N/A 11/03/2018  ? Procedure: ESOPHAGOGASTRODUODENOSCOPY (EGD) WITH PROPOFOL;  Surgeon: Lin Landsman, MD;  Location: [facility] ENDOSCOPY;  Service: Gastroenterology;  Laterality: N/A;  ? ESOPHAGOGASTRODUODENOSCOPY (EGD) WITH PROPOFOL N/A 12/06/2018  ? Procedure: ESOPHAGOGASTRODUODENOSCOPY (EGD) WITH PROPOFOL;  Surgeon: Lucilla Lame, MD;  Location:  [facility] ENDOSCOPY;  Service: Endoscopy;  Laterality: N/A;  ? ESOPHAGOGASTRODUODENOSCOPY (EGD) WITH PROPOFOL N/A 03/19/2021  ? Procedure: ESOPHAGOGASTRODUODENOSCOPY (EGD) WITH PROPOFOL;  Surgeon: Lin Landsman, MD;  Location: [facility] ENDOSCOPY;  Service: Gastroenterology;  Laterality: N/A;  ? ICD IMPLANT N/A 05/14/2021  ? Procedure: ICD IMPLANT;  Surgeon: [name], MD;  Location: [place] CV LAB;  Service: Cardiovascular;  Laterality: N/A;  ? IR CT HEAD LTD  01/30/2021  ? IR PERCUTANEOUS ART THROMBECTOMY/INFUSION INTRACRANIAL INC DIAG ANGIO  01/30/2021  ? LEFT HEART CATH AND CORONARY ANGIOGRAPHY N/A 01/28/2021  ? Procedure: LEFT HEART CATH AND CORONARY ANGIOGRAPHY;  Surgeon: Nelva Bush, MD;  Location: [place] CV LAB;  Service: Cardiovascular;  Laterality: N/A;  ? RADIOLOGY WITH ANESTHESIA N/A 01/30/2021  ? Procedure: IR WITH ANESTHESIA;  Surgeon: Luanne Bras, MD;  Location: [place];  Service: Radiology;  Laterality: N/A;  ? TONSILLECTOMY AND ADENOIDECTOMY    ? TRANSCAROTID ARTERY REVASCULARIZATION?  Left 02/07/2021  ? Procedure: TRANSCAROTID ARTERY REVASCULARIZATION LEFT;  Surgeon: [name], MD;  Location: [facility] OR;  Service: Vascular;  Laterality: Left;  ? ? ?Allergies ? ?Allergies  ?Allergen Reactions  ? Crestor [Rosuvastatin]   ?  Cramps. (Hight dose of Crestor)  ? Livalo [Pitavastatin]   ?  myalgia  ? Pravachol [Pravastatin Sodium]   ?  Cramps  ? Zocor [Simvastatin]   ?  Cramps  ? ? ?History of Present Illness  ?  ?76 year old male with a history of CAD, ischemic cardiomyopathy, chronic heart failure with midrange ejection fraction, ventricular tachycardia status post AICD, stroke, carotid disease status post left carotid stenting, AAA status post endovascular repair, peripheral arterial disease, renal artery stenosis status post PTA, gastric ulcer status post cauterization, hypertension, hyperlipidemia, and dilated aortic root.  He previously suffered myocardial infarction in 1996  and was noted to have chronic total occlusions of the left circumflex and right coronary artery on catheterization in 2011.  In October 2013, he suffered a non-STEMI while vacationing in [place] and underwent stenting of the proximal ramus.  In April 2022, he was admitted with sustained ventricular tachycardia requiring cardioversion.  He was placed on amiodarone.  Echocardiogram showed an EF of 45 to 50%.  Diagnostic catheterization showed stable anatomy with chronic total occlusions of the RCA and circumflex, and patent ramus intermedius stent.  During admission, he suffered a left MCA stroke and received tPA.  Thrombectomy was attempted but unsuccessful.  He was found to have 75% stenosis in the left internal carotid artery and subsequently underwent left carotid stenting in [place].  He was readmitted in June 2022 with melena and anemia in the setting of aspirin and Plavix therapy.  He was also noted to have nonsustained VT and was placed on amiodarone.  He was subsequently seen by electrophysiology in the outpatient setting in August 2022, underwent single lead AICD placement.  He has been maintained on amiodarone and mexiletine and required ATP in December 2022. ? ?Mr. Rumpf was last seen in cardiology clinic 6 weeks ago, at which time he was doing well.  Amiodarone was reduced to 200 mg once daily.  On  March 13th patient noted symptoms of VT, which she described as not feeling well.  A remote device check was performed on March 14, which showed VT with successful ATP.  Amiodarone was increased to 10 mg twice daily x7 days and follow-up labs showed potassium 3.8 and magnesium of 2.3.  On March 27, our device clinic received an alert that patient had ATP x1 delivered on March 24 and again on March 25.  Patient was contacted and reported no symptoms.  Today, he says that he has been feeling well.  He was unaware of any device therapies last week.  He has remained active without symptoms or limitations and  denies chest pain, dyspnea, palpitations, PND, orthopnea, dizziness, syncope, edema, or early satiety.  He has been taking amiodarone 200 mg twice daily since the 14th. ? ?Home Medications  ?  ?Prior to

## 2022-01-03 LAB — BASIC METABOLIC PANEL
BUN/Creatinine Ratio: 15 (ref 10–24)
BUN: 19 mg/dL (ref 8–27)
CO2: 24 mmol/L (ref 20–29)
Calcium: 9.4 mg/dL (ref 8.6–10.2)
Chloride: 106 mmol/L (ref 96–106)
Creatinine, Ser: 1.3 mg/dL — ABNORMAL HIGH (ref 0.76–1.27)
Glucose: 76 mg/dL (ref 70–99)
Potassium: 4.2 mmol/L (ref 3.5–5.2)
Sodium: 146 mmol/L — ABNORMAL HIGH (ref 134–144)
eGFR: 58 mL/min/{1.73_m2} — ABNORMAL LOW (ref >59)

## 2022-01-03 LAB — MAGNESIUM: Magnesium: 2.1 mg/dL (ref 1.6–2.3)

## 2022-01-05 ENCOUNTER — Telehealth: Payer: Self-pay

## 2022-01-05 ENCOUNTER — Ambulatory Visit: Payer: Medicare Other | Attending: Family Medicine | Admitting: Speech Pathology

## 2022-01-05 DIAGNOSIS — R4701 Aphasia: Secondary | ICD-10-CM | POA: Insufficient documentation

## 2022-01-05 DIAGNOSIS — R471 Dysarthria and anarthria: Secondary | ICD-10-CM | POA: Insufficient documentation

## 2022-01-05 DIAGNOSIS — R482 Apraxia: Secondary | ICD-10-CM | POA: Insufficient documentation

## 2022-01-05 DIAGNOSIS — I63412 Cerebral infarction due to embolism of left middle cerebral artery: Secondary | ICD-10-CM | POA: Insufficient documentation

## 2022-01-05 NOTE — Telephone Encounter (Signed)
Spoke with patient regarding slow VT episode from 01/02/22 at 1:53 AM patient was asleep, episode fell just below detection rate, termination not seen. VT-1 detection 481m. ? ?Requested manual transmission no other episodes .  ? ?Next F/U with CL 03/04/22.  ? ? ? ? ? ? ? ?

## 2022-01-06 NOTE — Therapy (Signed)
[facility] ?[place], [place], [address] ?Phone: [phone]   Fax:  [phone] ? ?Speech Language Pathology Treatment ? ?Patient Details  ?Name: Garrett Hunter ?MRN: [id number] ?Date of Birth: August 26, 1946 ?Referring Provider (SLP): Derinda Late ? ? ?Encounter Date: 01/05/2022 ? ? End of Session - 01/06/22 1201   ? ? Visit Number 22   ? Number of Visits 44   ? Date for SLP Re-Evaluation 03/25/22   ? Authorization Type Medicare   ? Authorization Time Period 12/29/2021 thru 03/25/2022   ? Authorization - Visit Number 2   ? Progress Note Due on Visit 10   ? SLP Start Time 1300   ? SLP Stop Time  1400   ? SLP Time Calculation (min) 60 min   ? Activity Tolerance Patient tolerated treatment well   ? ?  ?  ? ?  ? ? ?Past Medical History:  ?Diagnosis Date  ? AAA (abdominal aortic aneurysm)   ? thoracoabdominal aortic aneursym, s/p right ilio-femoral bypass 10/12/16; 3V FEVAR with SMA, RRA, and LRA stenting 11/16/16 by Dr. Sammuel Hines [facility])  ? Benign neoplasm of colon   ? patient not sure  ? Chronic airway obstruction, not elsewhere classified   ? pt denies  ? 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 a. 01/2021 Echo: EF 45-50%, mild LVH, GrII DD, basal inf AK, nl RV fxn, mildly dil LA, triv MR. Ao root 66m.  ? Coronary artery disease   ? a. 1996 Inf MI: TPA; b. 2011 Cath: CTO pRCA/pLCX; c. 07/2012 NSTEMI/Cath (HI): plaque rupture in pRamius (stenting); d. 01/2021 Cath (in setting of VT): LM nl, LAD min irregs, RI patent stent, LCX 100p, OM2 fills via collats from D1, LPL1 - collats from dLAD, RCA 100p, RPDA - collats from Sept1/2.  ? Dilated aortic root ([place]   ? a. 01/2021 Echo: Ao root 368m  ? Gastric ulcer   ? a. 07/2018 s/p cauterization.  ? Hepatitis A   ? teenager, no longer a problem  ? Hyperlipidemia   ? Intolerance to statins.  ? Hypertension   ? Left carotid stenosis   ? a. 02/2021 s/p9 x 40 Enroute L carotid stent.  ?  [place] (myocardial infarction) ([place]  ? x 3 - last one 2013  ? Renal artery stenosis ([place]  ? a. 07/2018 s/p bilateral renal artery angioplasty.  ? [facility]  ? Ventricular tachycardia   ? a. 01/2021 s/p DCCV; b. 05/2021 s/p Biotronik Acticor 7 VR-T DX AICD (ser# [id number]  ? ? ?Past Surgical History:  ?Procedure Laterality Date  ? ABDOMINAL AORTIC ANEURYSM REPAIR    ? 11/16/2016  ? ANGIOPLASTY    ? CARDIAC CATHETERIZATION  2011  ? CARDIAC CATHETERIZATION  2/14  ? [facility]; 95% lesion in the ramus intermedius.   ? CATARACT EXTRACTION W/PHACO Left 12/19/2018  ? Procedure: CATARACT EXTRACTION PHACO AND INTRAOCULAR LENS PLACEMENT ([place] LEFT;  Surgeon: KiEulogio BearMD;  [place] Ophthal[place];  Laterality: Left;  ? CATARACT EXTRACTION W/PHACO Right 03/06/2019  ? Procedure: CATARACT EXTRACTION PHACO AND INTRAOCULAR LENS PLACEMENT (IOC)RIGHT;  Surgeon: KiEulogio BearMD;  [place] Service: Ophthalmology;  Laterality: Right;  ? COLONOSCOPY WITH PROPOFOL N/A 03/20/2021  ? Procedure: COLONOSCOPY WITH PROPOFOL;  Surgeon: VaLin LandsmanMD;  [facility];  Service: Gastroenterology;  Laterality: N/A;  ? CORONARY ANGIOPLASTY  1996  ? [facility] x1  ? CORONARY ANGIOPLASTY  2/14  ? Severe restenosis in the ostial ramus. Status post angioplasty and drug-eluting stent placement with a 2.5 x 18 mm Xience drug-eluting stent  ? ESOPHAGOGASTRODUODENOSCOPY N/A 08/03/2018  ? Procedure: ESOPHAGOGASTRODUODENOSCOPY (EGD);  Surgeon: Lin Landsman, MD;  [facility] END[facility]COPY;  Service: Gastroenterology;  Laterality: N/A;  ? ESOPHAGOGASTRODUODENOSCOPY (EGD) WITH PROPOFOL N/A 11/03/2018  ? Procedure: ESOPHAGOGASTRODUODENOSCOPY (EGD) WITH PROPOFOL;  Surgeon: Lin Landsman, MD;  Location: [facility] ENDOSCOPY;  Service: Gastroenterology;  Laterality: N/A;  ? ESOPHAGOGASTRODUODENOSCOPY (EGD) WITH PROPOFOL N/A 12/06/2018  ? Procedure: ESOPHAGOGASTRODUODENOSCOPY  (EGD) WITH PROPOFOL;  Surgeon: Lucilla Lame, MD;  [facility] END[facility]COPY;  Service: Endoscopy;  Laterality: N/A;  ? ESOPHAGOGASTRODUODENOSCOPY (EGD) WITH PROPOFOL N/A 03/19/2021  ? Procedure: ESOPHAGOGASTRODUODENOSCOPY (EGD) WITH PROPOFOL;  Surgeon: Lin Landsman, MD;  Location: [facility] ENDOSCOPY;  Service: Gastroenterology;  Laterality: N/A;  ? ICD IMPLANT N/A 05/14/2021  ? Procedure: ICD IMPLANT;  Surgeon: [name], MD;  Location: [place] CV LAB;  Service: Cardiovascular;  Laterality: N/A;  ? IR CT HEAD LTD  01/30/2021  ? IR PERCUTANEOUS ART THROMBECTOMY/INFUSION INTRACRANIAL INC DIAG ANGIO  01/30/2021  ? LEFT HEART CATH AND CORONARY ANGIOGRAPHY N/A 01/28/2021  ? Procedure: LEFT HEART CATH AND CORONARY ANGIOGRAPHY;  Surgeon: Nelva Bush, MD;  Location: [place] CV LAB;  Service: Cardiovascular;  Laterality: N/A;  ? RADIOLOGY WITH ANESTHESIA N/A 01/30/2021  ? Procedure: IR WITH ANESTHESIA;  Surgeon: Luanne Bras, MD;  Location: [place];  Service: Radiology;  Laterality: N/A;  ? TONSILLECTOMY AND ADENOIDECTOMY    ? TRANSCAROTID ARTERY REVASCULARIZATION?  Left 02/07/2021  ? Procedure: TRANSCAROTID ARTERY REVASCULARIZATION LEFT;  Surgeon: [name], MD;  Location: [facility] OR;  Service: Vascular;  Laterality: Left;  ? ? ?There were no vitals filed for this visit. ? ? Subjective Assessment - 01/06/22 1200   ? ? Subjective "my weekend was so so, didn't get to see my grandbabies"   ? Currently in Pain? No/denies   ? ?  ?  ? ?  ? ? ? ? ? ? ? ? ADULT SLP TREATMENT - 01/06/22 0001   ? ?  ? Treatment Provided  ? Treatment provided Cognitive-Linquistic   ?  ? Cognitive-Linquistic Treatment  ? 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 Skilled Treatment Skilled treatment session focused on pt's language impairments, specifically word finding, written language and apraxia of speech.  ? ?SLP facilitated session by providing the following interventions:     ? ?Apraxia  of Speech: Pt enjoys cooking therefore SLP engaged pt in reading a basic recipe. This proved less productive as pt struggled with motor patterns of words of measurement as these words aren't functionally used in daily language. Therefore SLP facilitated contextually based conversation to target his word finding ability and speech intelligibility.  Pt was Mod I with rate of speech with noticeable improved speech intelligibility when providing information on various landscaping techniques as well as different growth patterns for several bushes.   ? ?  ?  ? ?  ? ? ? SLP Education - 01/06/22 1201   ? ? Education Details speech intelligibility and word finding strategies   ? Person(s) Educated Patient   ? Methods Explanation;Demonstration;Verbal cues   ? Comprehension Verbalized understanding;Returned demonstration;Need further instruction   ? ?  ?  ? ?  ? ? ? SLP Short Term Goals - 12/31/21 0715   ? ?  ? SLP SHORT TERM GOAL #1  ?  Title Pt will produce sentences in response to a question (e.g. what?s your favorite holiday and why) with appropriate articulation at 75% accuracy given frequent moderate phonemic placement cues.   ? Time 10   ? Period --   sessions  ? Status On-going   ?  ? SLP SHORT TERM GOAL #2  ? Title Pt will expressively produce 4+ word sentence in response to situation/picture with 50% accuracy given moderate cues in order to increase ability to communicate basic wants needs.   ? Time 10   ? Period --   sessions  ? Status On-going   ?  ? SLP SHORT TERM GOAL #3  ? Title Pt will write grammatically correct 5 word sentence about a basic picture with minimal assistance.   ? Time 10   ? Period --   sessions  ? Status On-going   ?  ? SLP SHORT TERM GOAL #4  ? Title Pt will initiate use of compensatory strategy to repair moments of communication breakdown 2 times in a session.   ? Time 10   ? Period --   sessions  ? Status On-going   ? ?  ?  ? ?  ? ? ? SLP Long Term Goals - 12/31/21 0716   ? ?  ? SLP LONG TERM  GOAL #1  ? Title Pt will use functional communication skills for social interaction (e.g., greetings,  social etiquette, and short questions/simple sentences) with both familiar and unfamiliar  partners w

## 2022-01-07 ENCOUNTER — Ambulatory Visit: Payer: Medicare Other | Admitting: Speech Pathology

## 2022-01-07 DIAGNOSIS — R4701 Aphasia: Secondary | ICD-10-CM

## 2022-01-07 DIAGNOSIS — R482 Apraxia: Secondary | ICD-10-CM

## 2022-01-07 DIAGNOSIS — I63412 Cerebral infarction due to embolism of left middle cerebral artery: Secondary | ICD-10-CM

## 2022-01-09 NOTE — Therapy (Signed)
?[facility] ?[place], [place], [address] ?Phone: [phone]   Fax:  [phone] ? ?Speech Language Pathology Treatment ? ?Patient Details  ?Name: Garrett Hunter ?MRN: [id number] ?Date of Birth: 04/11/1946 ?Referring Provider (SLP): Derinda Late ? ? ?Encounter Date: 01/07/2022 ? ? End of Session - 01/09/22 0710   ? ? Visit Number 23   ? Number of Visits 44   ? Date for SLP Re-Evaluation 03/25/22   ? Authorization Type Medicare   ? Authorization Time Period 12/29/2021 thru 03/25/2022   ? Authorization - Visit Number 3   ? Progress Note Due on Visit 10   ? SLP Start Time 1300   ? SLP Stop Time  1400   ? SLP Time Calculation (min) 60 min   ? Activity Tolerance Patient tolerated treatment well   ? ?  ?  ? ?  ? ? ?Past Medical History:  ?Diagnosis Date  ? AAA (abdominal aortic aneurysm)   ? thoracoabdominal aortic aneursym, s/p right ilio-femoral bypass 10/12/16; 3V FEVAR with SMA, RRA, and LRA stenting 11/16/16 by Dr. Sammuel Hines [facility])  ? Benign neoplasm of colon   ? patient not sure  ? Chronic airway obstruction, not elsewhere classified   ? pt denies  ? 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 a. 01/2021 Echo: EF 45-50%, mild LVH, GrII DD, basal inf AK, nl RV fxn, mildly dil LA, triv MR. Ao root 41m.  ? Coronary artery disease   ? a. 1996 Inf MI: TPA; b. 2011 Cath: CTO pRCA/pLCX; c. 07/2012 NSTEMI/Cath (HI): plaque rupture in pRamius (stenting); d. 01/2021 Cath (in setting of VT): LM nl, LAD min irregs, RI patent stent, LCX 100p, OM2 fills via collats from D1, LPL1 - collats from dLAD, RCA 100p, RPDA - collats from Sept1/2.  ? Dilated aortic root ([place]   ? a. 01/2021 Echo: Ao root 379m  ? Gastric ulcer   ? a. 07/2018 s/p cauterization.  ? Hepatitis A   ? teenager, no longer a problem  ? Hyperlipidemia   ? Intolerance to statins.  ? Hypertension   ? Left carotid stenosis   ? a. 02/2021 s/p9 x 40 Enroute L carotid stent.  ?  MI (myocardial infarction) ([place]  ? x 3 - last one 2013  ? Renal artery stenosis (HCM[place]  ? a. 07/2018 s/p bilateral renal artery angioplasty.  ? [facility] ([facility]  ? Ventricular tachycardia   ? a. 01/2021 s/p DCCV; b. 05/2021 s/p Biotronik Acticor 7 VR-T DX AICD (ser# [id number]  ? ? ?Past Surgical History:  ?Procedure Laterality Date  ? ABDOMINAL AORTIC ANEURYSM REPAIR    ? 11/16/2016  ? ANGIOPLASTY    ? CARDIAC CATHETERIZATION  2011  ? CARDIAC CATHETERIZATION  2/14  ? [facility]; 95% lesion in the ramus intermedius.   ? CATARACT EXTRACTION W/PHACO Left 12/19/2018  ? Procedure: CATARACT EXTRACTION PHACO AND INTRAOCULAR LENS PLACEMENT ([place] LEFT;  Surgeon: KiEulogio BearMD;  [place] Service: Ophthalmology;  Laterality: Left;  ? CATARACT EXTRACTION W/PHACO Right 03/06/2019  ? Procedure: CATARACT EXTRACTION PHACO AND INTRAOCULAR LENS PLACEMENT (IOC)RIGHT;  Surgeon: KiEulogio BearMD;  [place] Service: Ophthalmology;  Laterality: Right;  ? COLONOSCOPY WITH PROPOFOL N/A 03/20/2021  ? Procedure: COLONOSCOPY WITH PROPOFOL;  Surgeon: VaLin LandsmanMD;  [facility];  Service: Gastroenterology;  Laterality: N/A;  ? CORONARY ANGIOPLASTY  1996  ? [facility] x1  ? CORONARY ANGIOPLASTY  2/14  ? Severe restenosis in the ostial ramus. Status post angioplasty and drug-eluting stent placement with a 2.5 x 18 mm Xience drug-eluting stent  ? ESOPHAGOGASTRODUODENOSCOPY N/A 08/03/2018  ? Procedure: ESOPHAGOGASTRODUODENOSCOPY (EGD);  Surgeon: Lin Landsman, MD;  Location: [facility] ENDOSCOPY;  Service: Gastroenterology;  Laterality: N/A;  ? ESOPHAGOGASTRODUODENOSCOPY (EGD) WITH PROPOFOL N/A 11/03/2018  ? Procedure: ESOPHAGOGASTRODUODENOSCOPY (EGD) WITH PROPOFOL;  Surgeon: Lin Landsman, MD;  Location: [facility] ENDOSCOPY;  Service: Gastroenterology;  Laterality: N/A;  ? ESOPHAGOGASTRODUODENOSCOPY (EGD) WITH PROPOFOL N/A 12/06/2018  ? Procedure: ESOPHAGOGASTRODUODENOSCOPY  (EGD) WITH PROPOFOL;  Surgeon: Lucilla Lame, MD;  Location[facility] ENDOSCOPY;  Service: Endoscopy;  Laterality: N/A;  ? ESOPHAGOGASTRODUODENOSCOPY (EGD) WITH PROPOFOL N/A 03/19/2021  ? Procedure: ESOPHAGOGASTRODUODENOSCOPY (EGD) WITH PROPOFOL;  Surgeon: Lin Landsman, MD;  Location: [facility] ENDOSCOPY;  Service: Gastroenterology;  Laterality: N/A;  ? ICD IMPLANT N/A 05/14/2021  ? Procedure: ICD IMPLANT;  Surgeon: [name], MD;  Location: [place] CV LAB;  Service: Cardiovascular;  Laterality: N/A;  ? IR CT HEAD LTD  01/30/2021  ? IR PERCUTANEOUS ART THROMBECTOMY/INFUSION INTRACRANIAL INC DIAG ANGIO  01/30/2021  ? LEFT HEART CATH AND CORONARY ANGIOGRAPHY N/A 01/28/2021  ? Procedure: LEFT HEART CATH AND CORONARY ANGIOGRAPHY;  Surgeon: Nelva Bush, MD;  Location: [place] CV LAB;  Service: Cardiovascular;  Laterality: N/A;  ? RADIOLOGY WITH ANESTHESIA N/A 01/30/2021  ? Procedure: IR WITH ANESTHESIA;  Surgeon: Luanne Bras, MD;  Location: [place];  Service: Radiology;  Laterality: N/A;  ? TONSILLECTOMY AND ADENOIDECTOMY    ? TRANSCAROTID ARTERY REVASCULARIZATION?  Left 02/07/2021  ? Procedure: TRANSCAROTID ARTERY REVASCULARIZATION LEFT;  Surgeon: [name], MD;  Location: [facility] OR;  Service: Vascular;  Laterality: Left;  ? ? ?There were no vitals filed for this visit. ? ? Subjective Assessment - 01/09/22 0707   ? ? Subjective pt eager, motivated   ? Currently in Pain? No/denies   ? ?  ?  ? ?  ? ? ? ? ? ? ? ? ADULT SLP TREATMENT - 01/09/22 0001   ? ?  ? Treatment Provided  ? Treatment provided Cognitive-Linquistic   ?  ? Cognitive-Linquistic Treatment  ? 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 Skilled Treatment Skilled treatment session focused on pt's speech intelligibility specifically using pacing board.  ? ?SLP facilitated session by providing the following interventions:     ? ?Use of speech intelligibility strategies: Increased cognitive load by  instructing pt in novel card game of [place].  Pt was Mod I for rate control when using sentence level utterances to inquire about the rules or potential plays.   ? ?  ?  ? ?  ? ? ? SLP Education - 01/09/22 0710   ? ? Education Details speech intelligibility and word finding strategies   ? Person(s) Educated Patient   ? Methods Explanation;Demonstration;Verbal cues   ? Comprehension Verbalized understanding;Returned demonstration;Need further instruction   ? ?  ?  ? ?  ? ? ? SLP Short Term Goals - 12/31/21 0715   ? ?  ? SLP SHORT TERM GOAL #1  ? Title Pt will produce sentences in response to a question (e.g. what?s your favorite holiday and why) with appropriate articulation at 75% accuracy given frequent moderate phonemic placement cues.   ? Time 10   ? Period --   sessions  ? Status On-going   ?  ? SLP SHORT  TERM GOAL #2  ? Title Pt will expressively produce 4+ word sentence in response to situation/picture with 50% accuracy given moderate cues in order to increase ability to communicate basic wants needs.   ? Time 10   ? Period --   sessions  ? Status On-going   ?  ? SLP SHORT TERM GOAL #3  ? Title Pt will write grammatically correct 5 word sentence about a basic picture with minimal assistance.   ? Time 10   ? Period --   sessions  ? Status On-going   ?  ? SLP SHORT TERM GOAL #4  ? Title Pt will initiate use of compensatory strategy to repair moments of communication breakdown 2 times in a session.   ? Time 10   ? Period --   sessions  ? Status On-going   ? ?  ?  ? ?  ? ? ? SLP Long Term Goals - 12/31/21 0716   ? ?  ? SLP LONG TERM GOAL #1  ? Title Pt will use functional communication skills for social interaction (e.g., greetings,  social etiquette, and short questions/simple sentences) with both familiar and unfamiliar  partners with 90% success.   ? Status Achieved   ?  ? SLP LONG TERM GOAL #2  ? Title Patient will report carryover of communication effectiveness strategies to reduce frustration  when breakdowns occur.   ? Baseline total assistance for use of strategies   ? Time 12   ? Period Weeks   ? Status New   ? Target Date 03/25/22   ? ?  ?  ? ?  ? ? ? Plan - 01/09/22 0710   ? ? Clinical Impression St

## 2022-01-12 ENCOUNTER — Ambulatory Visit: Payer: Medicare Other | Admitting: Speech Pathology

## 2022-01-12 DIAGNOSIS — R4701 Aphasia: Secondary | ICD-10-CM | POA: Diagnosis not present

## 2022-01-12 DIAGNOSIS — I63412 Cerebral infarction due to embolism of left middle cerebral artery: Secondary | ICD-10-CM

## 2022-01-12 DIAGNOSIS — R482 Apraxia: Secondary | ICD-10-CM

## 2022-01-13 NOTE — Therapy (Signed)
[place] [facility] ?[place], [place], [address] ?Phone: [phone]   Fax:  [phone] ? ?Speech Language Pathology Treatment ? ?Patient Details  ?Name: Garrett Hunter ?MRN: [id number] ?Date of Birth: May 17, 1946 ?Referring Provider (SLP): Derinda Late ? ? ?Encounter Date: 01/12/2022 ? ? End of Session - 01/13/22 1324   ? ? Visit Number 24   ? Number of Visits 44   ? Date for SLP Re-Evaluation 03/25/22   ? Authorization Type Medicare   ? Authorization Time Period 12/29/2021 thru 03/25/2022   ? Authorization - Visit Number 4   ? Progress Note Due on Visit 10   ? SLP Start Time 1300   ? SLP Stop Time  1400   ? SLP Time Calculation (min) 60 min   ? Activity Tolerance Patient tolerated treatment well   ? ?  ?  ? ?  ? ? ?Past Medical History:  ?Diagnosis Date  ? AAA (abdominal aortic aneurysm)   ? thoracoabdominal aortic aneursym, s/p right ilio-femoral bypass 10/12/16; 3V FEVAR with SMA, RRA, and LRA stenting 11/16/16 by Dr. Sammuel Hines [facility])  ? Benign neoplasm of colon   ? patient not sure  ? Chronic airway obstruction, not elsewhere classified   ? pt denies  ? 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 a. 01/2021 Echo: EF 45-50%, mild LVH, GrII DD, basal inf AK, nl RV fxn, mildly dil LA, triv MR. Ao root 38m.  ? Coronary artery disease   ? a. 1996 Inf MI: TPA; b. 2011 Cath: CTO pRCA/pLCX; c. 07/2012 NSTEMI/Cath (HI): plaque rupture in pRamius (stenting); d. 01/2021 Cath (in setting of VT): LM nl, LAD min irregs, RI patent stent, LCX 100p, OM2 fills via collats from D1, LPL1 - collats from dLAD, RCA 100p, RPDA - collats from Sept1/2.  ? Dilated aortic root ([place]   ? a. 01/2021 Echo: Ao root 32m  ? Gastric ulcer   ? a. 07/2018 s/p cauterization.  ? Hepatitis A   ? teenager, no longer a problem  ? Hyperlipidemia   ? Intolerance to statins.  ? Hypertension   ? Left carotid stenosis   ? a. 02/2021 s/p9 x 40 Enroute L carotid stent.  ?  MI (myocardial infarction) ([place]  ? x 3 - last one 2013  ? Renal artery stenosis ([place]  ? a. 07/2018 s/p bilateral renal artery angioplasty.  ? [facility]  ? Ventricular tachycardia   ? a. 01/2021 s/p DCCV; b. 05/2021 s/p Biotronik Acticor 7 VR-T DX AICD (ser# [id number]  ? ? ?Past Surgical History:  ?Procedure Laterality Date  ? ABDOMINAL AORTIC ANEURYSM REPAIR    ? 11/16/2016  ? ANGIOPLASTY    ? CARDIAC CATHETERIZATION  2011  ? CARDIAC CATHETERIZATION  2/14  ? [facility]; 95% lesion in the ramus intermedius.   ? CATARACT EXTRACTION W/PHACO Left 12/19/2018  ? Procedure: CATARACT EXTRACTION PHACO AND INTRAOCULAR LENS PLACEMENT ([place] LEFT;  Surgeon: KiEulogio BearMD;  [facility] Service: Ophthalmology;  Laterality: Left;  ? CATARACT EXTRACTION W/PHACO Right 03/06/2019  ? Procedure: CATARACT EXTRACTION PHACO AND INTRAOCULAR LENS PLACEMENT (IOC)RIGHT;  Surgeon: KiEulogio BearMD;  [place] Service: Ophthalmology;  Laterality: Right;  ? COLONOSCOPY WITH PROPOFOL N/A 03/20/2021  ? Procedure: COLONOSCOPY WITH PROPOFOL;  Surgeon: VaLin LandsmanMD;  [facility];  Service: Gastroenterology;  Laterality: N/A;  ? CORONARY ANGIOPLASTY  1996  ? [facility] x1  ? CORONARY ANGIOPLASTY  2/14  ? Severe restenosis in the ostial ramus. Status post angioplasty and drug-eluting stent placement with a 2.5 x 18 mm Xience drug-eluting stent  ? ESOPHAGOGASTRODUODENOSCOPY N/A 08/03/2018  ? Procedure: ESOPHAGOGASTRODUODENOSCOPY (EGD);  Surgeon: Lin Landsman, MD;  Location: [facility] ENDOSCOPY;  Service: Gastroenterology;  Laterality: N/A;  ? ESOPHAGOGASTRODUODENOSCOPY (EGD) WITH PROPOFOL N/A 11/03/2018  ? Procedure: ESOPHAGOGASTRODUODENOSCOPY (EGD) WITH PROPOFOL;  Surgeon: Lin Landsman, MD;  [facility] END[facility]COPY;  Service: Gastroenterology;  Laterality: N/A;  ? ESOPHAGOGASTRODUODENOSCOPY (EGD) WITH PROPOFOL N/A 12/06/2018  ? Procedure: ESOPHAGOGASTRODUODENOSCOPY  (EGD) WITH PROPOFOL;  Surgeon: Lucilla Lame, MD;  Location: [facility] ENDOSCOPY;  Service: Endoscopy;  Laterality: N/A;  ? ESOPHAGOGASTRODUODENOSCOPY (EGD) WITH PROPOFOL N/A 03/19/2021  ? Procedure: ESOPHAGOGASTRODUODENOSCOPY (EGD) WITH PROPOFOL;  Surgeon: Lin Landsman, MD;  Location: [facility] ENDOSCOPY;  Service: Gastroenterology;  Laterality: N/A;  ? ICD IMPLANT N/A 05/14/2021  ? Procedure: ICD IMPLANT;  Surgeon: [name], MD;  Location: [place] CV LAB;  Service: Cardiovascular;  Laterality: N/A;  ? IR CT HEAD LTD  01/30/2021  ? IR PERCUTANEOUS ART THROMBECTOMY/INFUSION INTRACRANIAL INC DIAG ANGIO  01/30/2021  ? LEFT HEART CATH AND CORONARY ANGIOGRAPHY N/A 01/28/2021  ? Procedure: LEFT HEART CATH AND CORONARY ANGIOGRAPHY;  Surgeon: Nelva Bush, MD;  Location: [place] CV LAB;  Service: Cardiovascular;  Laterality: N/A;  ? RADIOLOGY WITH ANESTHESIA N/A 01/30/2021  ? Procedure: IR WITH ANESTHESIA;  Surgeon: Luanne Bras, MD;  Location: [place];  Service: Radiology;  Laterality: N/A;  ? TONSILLECTOMY AND ADENOIDECTOMY    ? TRANSCAROTID ARTERY REVASCULARIZATION?  Left 02/07/2021  ? Procedure: TRANSCAROTID ARTERY REVASCULARIZATION LEFT;  Surgeon: [name], MD;  Location: [facility] OR;  Service: Vascular;  Laterality: Left;  ? ? ?There were no vitals filed for this visit. ? ? Subjective Assessment - 01/13/22 1315   ? ? Subjective "had a great Easter, too good, tired today"   ? Currently in Pain? No/denies   ? ?  ?  ? ?  ? ? ? ? ? ? ? ? ADULT SLP TREATMENT - 01/13/22 0001   ? ?  ? Treatment Provided  ? Treatment provided Cognitive-Linquistic   ?  ? Cognitive-Linquistic Treatment  ? 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  ? ?SLP facilitated session by providing the following interventions:     ? ?Apraxia  of Speech: SLP facilitated session by engaging pt in remotely novel card game. When asking phrase level clarifying questions pt was Mod I with controlling his rate resulting in ~ 75% speech intelligibility. SLP further facilitated session by having pt instruct SLP in another card game ([place]). Pt's rate of speech was well controlled inherently by the turn taking within the game resulting in ~ 90% speech intelligibility at the sentence level.   ? ?  ?  ? ?  ? ? ? SLP Education - 01/13/22 1324   ? ? Education Details speech intelligibility and word finding strategies   ? Person(s) Educated Patient   ? Methods Explanation;Demonstration;Verbal cues   ? Comprehension Verbalized understanding;Returned demonstration;Need further instruction   ? ?  ?  ? ?  ? ? ? SLP Short Term Goals - 12/31/21 0715   ? ?  ? SLP SHORT TERM GOAL #1  ? Title Pt will produce sentences in response to a  question (e.g. what?s your favorite holiday and why) with appropriate articulation at 75% accuracy given frequent moderate phonemic placement cues.   ? Time 10   ? Period --   sessions  ? Status On-going   ?  ? SLP SHORT TERM GOAL #2  ? Title Pt will expressively produce 4+ word sentence in response to situation/picture with 50% accuracy given moderate cues in order to increase ability to communicate basic wants needs.   ? Time 10   ? Period --   sessions  ? Status On-going   ?  ? SLP SHORT TERM GOAL #3  ? Title Pt will write grammatically correct 5 word sentence about a basic picture with minimal assistance.   ? Time 10   ? Period --   sessions  ? Status On-going   ?  ? SLP SHORT TERM GOAL #4  ? Title Pt will initiate use of compensatory strategy to repair moments of communication breakdown 2 times in a session.   ? Time 10   ? Period --   sessions  ? Status On-going   ? ?  ?  ? ?  ? ? ? SLP Long Term Goals - 12/31/21 0716   ? ?  ? SLP LONG TERM GOAL #1  ? Title Pt will use functional communication skills for social interaction (e.g., greetings,   social etiquette, and short questions/simple sentences) with both familiar and unfamiliar  partners with 90% success.   ? Status Achieved   ?  ? SLP LONG TERM GOAL #2  ? Title Patient will report carryo

## 2022-01-14 ENCOUNTER — Ambulatory Visit: Payer: Medicare Other | Admitting: Speech Pathology

## 2022-01-14 DIAGNOSIS — R4701 Aphasia: Secondary | ICD-10-CM | POA: Diagnosis not present

## 2022-01-14 DIAGNOSIS — I63412 Cerebral infarction due to embolism of left middle cerebral artery: Secondary | ICD-10-CM

## 2022-01-14 DIAGNOSIS — R482 Apraxia: Secondary | ICD-10-CM

## 2022-01-15 NOTE — Therapy (Signed)
[place] [facility] ?[place], [place], [address] ?Phone: [phone]   Fax:  [phone] ? ?Speech Language Pathology Treatment ? ?Patient Details  ?Name: Garrett Hunter ?MRN: [id number] ?Date of Birth: [birth year] ?Referring Provider (SLP): Derinda Late ? ? ?Encounter Date: 01/14/2022 ? ? End of Session - 01/15/22 1417   ? ? Visit Number 25   ? Number of Visits 44   ? Date for SLP Re-Evaluation 03/25/22   ? Authorization Type Medicare   ? Authorization Time Period 12/29/2021 thru 03/25/2022   ? Authorization - Visit Number 5   ? Progress Note Due on Visit 10   ? SLP Start Time 1300   ? SLP Stop Time  1400   ? SLP Time Calculation (min) 60 min   ? Activity Tolerance Patient tolerated treatment well   ? ?  ?  ? ?  ? ? ?Past Medical History:  ?Diagnosis Date  ? AAA (abdominal aortic aneurysm)   ? thoracoabdominal aortic aneursym, s/p right ilio-femoral bypass 10/12/16; 3V FEVAR with SMA, RRA, and LRA stenting 11/16/16 by Dr. Sammuel Hines [facility])  ? Benign neoplasm of colon   ? patient not sure  ? Chronic airway obstruction, not elsewhere classified   ? pt denies  ? 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 a. 01/2021 Echo: EF 45-50%, mild LVH, GrII DD, basal inf AK, nl RV fxn, mildly dil LA, triv MR. Ao root 32m.  ? Coronary artery disease   ? a. 1996 Inf MI: TPA; b. 2011 Cath: CTO pRCA/pLCX; c. 07/2012 NSTEMI/Cath ([place]): plaque rupture in pRamius (stenting); d. 01/2021 Cath (in setting of VT): LM nl, LAD min irregs, RI patent stent, LCX 100p, OM2 fills via collats from D1, LPL1 - collats from dLAD, RCA 100p, RPDA - collats from Sept1/2.  ? Dilated aortic root ([place]   ? a. 01/2021 Echo: Ao root 395m  ? Gastric ulcer   ? a. 07/2018 s/p cauterization.  ? Hepatitis A   ? teenager, no longer a problem  ? Hyperlipidemia   ? Intolerance to statins.  ? Hypertension   ? Left carotid stenosis   ? a. 02/2021 s/p9 x 40 Enroute L carotid stent.  ?  [place] (myocardial infarction) ([place]  ? x 3 - last one 2013  ? Renal artery stenosis ([place]  ? a. 07/2018 s/p bilateral renal artery angioplasty.  ? [facility] ([facility]  ? Ventricular tachycardia   ? a. 01/2021 s/p DCCV; b. 05/2021 s/p Biotronik Acticor 7 VR-T DX AICD (ser# [id number]  ? ? ?Past Surgical History:  ?Procedure Laterality Date  ? ABDOMINAL AORTIC ANEURYSM REPAIR    ? 11/16/2016  ? ANGIOPLASTY    ? CARDIAC CATHETERIZATION  2011  ? CARDIAC CATHETERIZATION  2/14  ? [facility]; 95% lesion in the ramus intermedius.   ? CATARACT EXTRACTION W/PHACO Left 12/19/2018  ? Procedure: CATARACT EXTRACTION PHACO AND INTRAOCULAR LENS PLACEMENT ([place] LEFT;  Surgeon: KiEulogio BearMD;  [place] Service: Ophthalmology;  Laterality: Left;  ? CATARACT EXTRACTION W/PHACO Right 03/06/2019  ? Procedure: CATARACT EXTRACTION PHACO AND INTRAOCULAR LENS PLACEMENT (IOC)RIGHT;  Surgeon: KiEulogio BearMD;  [place] Service: Ophthalmology;  Laterality: Right;  ? COLONOSCOPY WITH PROPOFOL N/A 03/20/2021  ? Procedure: COLONOSCOPY WITH PROPOFOL;  Surgeon: VaLin LandsmanMD;  [facility];  Service: Gastroenterology;  Laterality: N/A;  ? CORONARY ANGIOPLASTY  1996  ? [facility] x1  ? CORONARY ANGIOPLASTY  2/14  ? Severe restenosis in the ostial ramus. Status post angioplasty and drug-eluting stent placement with a 2.5 x 18 mm Xience drug-eluting stent  ? ESOPHAGOGASTRODUODENOSCOPY N/A 08/03/2018  ? Procedure: ESOPHAGOGASTRODUODENOSCOPY (EGD);  Surgeon: Lin Landsman, MD;  Location: [facility] ENDOSCOPY;  Service: Gastroenterology;  Laterality: N/A;  ? ESOPHAGOGASTRODUODENOSCOPY (EGD) WITH PROPOFOL N/A 11/03/2018  ? Procedure: ESOPHAGOGASTRODUODENOSCOPY (EGD) WITH PROPOFOL;  Surgeon: Lin Landsman, MD;  Location: [facility] ENDOSCOPY;  Service: Gastroenterology;  Laterality: N/A;  ? ESOPHAGOGASTRODUODENOSCOPY (EGD) WITH PROPOFOL N/A 12/06/2018  ? Procedure: ESOPHAGOGASTRODUODENOSCOPY  (EGD) WITH PROPOFOL;  Surgeon: Lucilla Lame, MD;  Location: [facility] ENDOSCOPY;  Service: Endoscopy;  Laterality: N/A;  ? ESOPHAGOGASTRODUODENOSCOPY (EGD) WITH PROPOFOL N/A 03/19/2021  ? Procedure: ESOPHAGOGASTRODUODENOSCOPY (EGD) WITH PROPOFOL;  Surgeon: Lin Landsman, MD;  [facility];  Service: Gastroenterology;  Laterality: N/A;  ? ICD IMPLANT N/A 05/14/2021  ? Procedure: ICD IMPLANT;  Surgeon: [name], MD;  Location: [place] CV LAB;  Service: Cardiovascular;  Laterality: N/A;  ? IR CT HEAD LTD  01/30/2021  ? IR PERCUTANEOUS ART THROMBECTOMY/INFUSION INTRACRANIAL INC DIAG ANGIO  01/30/2021  ? LEFT HEART CATH AND CORONARY ANGIOGRAPHY N/A 01/28/2021  ? Procedure: LEFT HEART CATH AND CORONARY ANGIOGRAPHY;  Surgeon: Nelva Bush, MD;  Location: [place] CV LAB;  Service: Cardiovascular;  Laterality: N/A;  ? RADIOLOGY WITH ANESTHESIA N/A 01/30/2021  ? Procedure: IR WITH ANESTHESIA;  Surgeon: Luanne Bras, MD;  Location: [place];  Service: Radiology;  Laterality: N/A;  ? TONSILLECTOMY AND ADENOIDECTOMY    ? TRANSCAROTID ARTERY REVASCULARIZATION?  Left 02/07/2021  ? Procedure: TRANSCAROTID ARTERY REVASCULARIZATION LEFT;  Surgeon: [name], MD;  Location: [facility] OR;  Service: Vascular;  Laterality: Left;  ? ? ?There were no vitals filed for this visit. ? ? Subjective Assessment - 01/15/22 1410   ? ? Subjective "I lost my pictures on my phone"   ? Currently in Pain? No/denies   ? ?  ?  ? ?  ? ? ? ? ? ? ? ? ADULT SLP TREATMENT - 01/15/22 0001   ? ?  ? Treatment Provided  ? Treatment provided Cognitive-Linquistic   ?  ? Cognitive-Linquistic Treatment  ? 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  ? ?SLP facilitated session by providing the following interventions:     ? ?Apraxia of Speech:  SLP facilitated session by engaging pt in another remotely novel card game. When asking phrase level clarifying questions pt was Mod I with controlling his rate resulting in ~ 75% speech intelligibility.  ? ?SLP continued to promote casual conversation by engaging pt in describing games that he played with his children. Pt required intermittent reminders to write down words that he had difficulty pronouncing   ? ?  ?  ? ?  ? ? ? SLP Education - 01/15/22 1417   ? ? Education Details speech intelligibility and word finding strategies   ? Person(s) Educated Patient   ? Methods Explanation;Verbal cues   ? Comprehension Verbalized understanding;Returned demonstration;Need further instruction   ? ?  ?  ? ?  ? ? ? SLP Short Term Goals - 12/31/21 0715   ? ?  ? SLP SHORT TERM GOAL #1  ? Title Pt will produce sentences in response to a question (e.g. what?s your favorite holiday  and why) with appropriate articulation at 75% accuracy given frequent moderate phonemic placement cues.   ? Time 10   ? Period --   sessions  ? Status On-going   ?  ? SLP SHORT TERM GOAL #2  ? Title Pt will expressively produce 4+ word sentence in response to situation/picture with 50% accuracy given moderate cues in order to increase ability to communicate basic wants needs.   ? Time 10   ? Period --   sessions  ? Status On-going   ?  ? SLP SHORT TERM GOAL #3  ? Title Pt will write grammatically correct 5 word sentence about a basic picture with minimal assistance.   ? Time 10   ? Period --   sessions  ? Status On-going   ?  ? SLP SHORT TERM GOAL #4  ? Title Pt will initiate use of compensatory strategy to repair moments of communication breakdown 2 times in a session.   ? Time 10   ? Period --   sessions  ? Status On-going   ? ?  ?  ? ?  ? ? ? SLP Long Term Goals - 12/31/21 0716   ? ?  ? SLP LONG TERM GOAL #1  ? Title Pt will use functional communication skills for social interaction (e.g., greetings,  social etiquette, and short questions/simple  sentences) with both familiar and unfamiliar  partners with 90% success.   ? Status Achieved   ?  ? SLP LONG TERM GOAL #2  ? Title Patient will report carryover of communication effectiveness strategies to

## 2022-01-19 ENCOUNTER — Ambulatory Visit: Payer: Medicare Other | Admitting: Speech Pathology

## 2022-01-20 ENCOUNTER — Telehealth: Payer: Self-pay

## 2022-01-20 NOTE — Telephone Encounter (Signed)
Outreach made to Pt regarding tachycardia treated with ATP x 3. ?Per Pt he does not recall any symptoms 01/19/2022 in the afternoon. ? ?He states he is taking amiodarone 400 mg daily and mexiletine 150 mg BID. ?Advised would forward to Dr. Quentin Ore for review and call him back if any changes. ? ? ? ? ? ?

## 2022-01-21 ENCOUNTER — Ambulatory Visit: Payer: Medicare Other | Admitting: Speech Pathology

## 2022-01-21 DIAGNOSIS — R4701 Aphasia: Secondary | ICD-10-CM | POA: Diagnosis not present

## 2022-01-21 DIAGNOSIS — R471 Dysarthria and anarthria: Secondary | ICD-10-CM

## 2022-01-21 DIAGNOSIS — R482 Apraxia: Secondary | ICD-10-CM

## 2022-01-21 NOTE — Patient Instructions (Signed)
Practice the words you get stuck on!  ?Write them down, then do repetitions ?

## 2022-01-21 NOTE — Therapy (Signed)
[place] ?[facility] ?[place], [place], [address] ?Phone: [phone]   Fax:  [phone] ? ?Speech Language Pathology Treatment ? ?Patient Details  ?Name: Garrett Hunter ?MRN: [id number] ?Date of Birth: 1945/10/23 ?Referring Provider (SLP): Derinda Late ? ? ?Encounter Date: 01/21/2022 ? ? End of Session - 01/21/22 1421   ? ? Visit Number 26   ? Number of Visits 44   ? Date for SLP Re-Evaluation 03/25/22   ? Authorization Type Medicare   ? Authorization Time Period 12/29/2021 thru 03/25/2022   ? Authorization - Visit Number 6   ? Progress Note Due on Visit 10   ? SLP Start Time 1300   ? SLP Stop Time  1400   ? SLP Time Calculation (min) 60 min   ? Activity Tolerance Patient tolerated treatment well   ? ?  ?  ? ?  ? ? ?Past Medical History:  ?Diagnosis Date  ? AAA (abdominal aortic aneurysm)   ? thoracoabdominal aortic aneursym, s/p right ilio-femoral bypass 10/12/16; 3V FEVAR with SMA, RRA, and LRA stenting 11/16/16 by Dr. Sammuel Hines [facility])  ? Benign neoplasm of colon   ? patient not sure  ? Chronic airway obstruction, not elsewhere classified   ? pt denies  ? 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 a. 01/2021 Echo: EF 45-50%, mild LVH, GrII DD, basal inf AK, nl RV fxn, mildly dil LA, triv MR. Ao root 62m.  ? Coronary artery disease   ? a. 1996 Inf MI: TPA; b. 2011 Cath: CTO pRCA/pLCX; c. 07/2012 NSTEMI/Cath (HI): plaque rupture in pRamius (stenting); d. 01/2021 Cath (in setting of VT): LM nl, LAD min irregs, RI patent stent, LCX 100p, OM2 fills via collats from D1, LPL1 - collats from dLAD, RCA 100p, RPDA - collats from Sept1/2.  ? Dilated aortic root ([place]   ? a. 01/2021 Echo: Ao root 359m  ? Gastric ulcer   ? a. 07/2018 s/p cauterization.  ? Hepatitis A   ? teenager, no longer a problem  ? Hyperlipidemia   ? Intolerance to statins.  ? Hypertension   ? Left carotid stenosis   ? a. 02/2021 s/p9 x 40 Enroute L carotid stent.  ?  [place] (myocardial infarction) ([place]  ? x 3 - last one 2013  ? Renal artery stenosis ([place]  ? a. 07/2018 s/p bilateral renal artery angioplasty.  ? [facility]  ? Ventricular tachycardia   ? a. 01/2021 s/p DCCV; b. 05/2021 s/p Biotronik Acticor 7 VR-T DX AICD (ser# [id number]  ? ? ?Past Surgical History:  ?Procedure Laterality Date  ? ABDOMINAL AORTIC ANEURYSM REPAIR    ? 11/16/2016  ? ANGIOPLASTY    ? CARDIAC CATHETERIZATION  2011  ? CARDIAC CATHETERIZATION  2/14  ? [facility]; 95% lesion in the ramus intermedius.   ? CATARACT EXTRACTION W/PHACO Left 12/19/2018  ? Procedure: CATARACT EXTRACTION PHACO AND INTRAOCULAR LENS PLACEMENT ([place] LEFT;  Surgeon: KiEulogio BearMD;  [place] Service: Ophthalmology;  Laterality: Left;  ? CATARACT EXTRACTION W/PHACO Right 03/06/2019  ? Procedure: CATARACT EXTRACTION PHACO AND INTRAOCULAR LENS PLACEMENT (IOC)RIGHT;  Surgeon: KiEulogio BearMD;  [place] Service: Ophthalmology;  Laterality: Right;  ? COLONOSCOPY WITH PROPOFOL N/A 03/20/2021  ? Procedure: COLONOSCOPY WITH PROPOFOL;  Surgeon: VaLin LandsmanMD;  [facility];  Service: Gastroenterology;  Laterality: N/A;  ? CORONARY ANGIOPLASTY  1996  ? [facility] x1  ? CORONARY ANGIOPLASTY  2/14  ? Severe restenosis in the ostial ramus. Status post angioplasty and drug-eluting stent placement with a 2.5 x 18 mm Xience drug-eluting stent  ? ESOPHAGOGASTRODUODENOSCOPY N/A 08/03/2018  ? Procedure: ESOPHAGOGASTRODUODENOSCOPY (EGD);  Surgeon: Lin Landsman, MD;  Location: [facility] ENDOSCOPY;  Service: Gastroenterology;  Laterality: N/A;  ? ESOPHAGOGASTRODUODENOSCOPY (EGD) WITH PROPOFOL N/A 11/03/2018  ? Procedure: ESOPHAGOGASTRODUODENOSCOPY (EGD) WITH PROPOFOL;  Surgeon: Lin Landsman, MD;  Location: [facility] ENDOSCOPY;  Service: Gastroenterology;  Laterality: N/A;  ? ESOPHAGOGASTRODUODENOSCOPY (EGD) WITH PROPOFOL N/A 12/06/2018  ? Procedure: ESOPHAGOGASTRODUODENOSCOPY  (EGD) WITH PROPOFOL;  Surgeon: Lucilla Lame, MD;  Location: [facility] ENDOSCOPY;  Service: Endoscopy;  Laterality: N/A;  ? ESOPHAGOGASTRODUODENOSCOPY (EGD) WITH PROPOFOL N/A 03/19/2021  ? Procedure: ESOPHAGOGASTRODUODENOSCOPY (EGD) WITH PROPOFOL;  Surgeon: Lin Landsman, MD;  [facility] END[facility]COPY;  Service: Gastroenterology;  Laterality: N/A;  ? ICD IMPLANT N/A 05/14/2021  ? Procedure: ICD IMPLANT;  Surgeon: [name], MD;  Location: [place] CV LAB;  Service: Cardiovascular;  Laterality: N/A;  ? IR CT HEAD LTD  01/30/2021  ? IR PERCUTANEOUS ART THROMBECTOMY/INFUSION INTRACRANIAL INC DIAG ANGIO  01/30/2021  ? LEFT HEART CATH AND CORONARY ANGIOGRAPHY N/A 01/28/2021  ? Procedure: LEFT HEART CATH AND CORONARY ANGIOGRAPHY;  Surgeon: Nelva Bush, MD;  Location: [place] CV LAB;  Service: Cardiovascular;  Laterality: N/A;  ? RADIOLOGY WITH ANESTHESIA N/A 01/30/2021  ? Procedure: IR WITH ANESTHESIA;  Surgeon: Luanne Bras, MD;  Location: [place];  Service: Radiology;  Laterality: N/A;  ? TONSILLECTOMY AND ADENOIDECTOMY    ? TRANSCAROTID ARTERY REVASCULARIZATION?  Left 02/07/2021  ? Procedure: TRANSCAROTID ARTERY REVASCULARIZATION LEFT;  Surgeon: [name], MD;  [facility];  Service: Vascular;  Laterality: Left;  ? ? ?There were no vitals filed for this visit. ? ? Subjective Assessment - 01/21/22 1407   ? ? Subjective "Its not what it was (re: speech) but its better than 6 months ago"   ? Currently in Pain? No/denies   ? ?  ?  ? ?  ? ? ? ? ? ? ? ? ADULT SLP TREATMENT - 01/21/22 0001   ? ?  ? Treatment Provided  ? Treatment provided Cognitive-Linquistic   ?  ? Cognitive-Linquistic Treatment  ? 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 Skilled Treatment Skilled intervention targeted improved motor speech production and articulation via sentence responses conversational questions. Initial set of x20 responses patient demonstrated appropriate articulation  with 55% accuracy independently, improved to 100% accuracy with mod-max phonemic placement cues and simultaneous production. Second set improved to 85% acc IND, again improved to 100% with mod-max cues. Third set still improved from the initial with 70% acc x23 IND, improved to 100% with mod-max cues as previously. Targeted appropriate articulation with reading trisyllabic words in preparation for more challenging conversational speech--patient demonstrated 71% accuracy x28 words IND, improved to 100% accuracy with max cues for placement and repeated demos/simultaneous production.  Targeted expressive writing of single min 5 word sentence. Patient demonstrated grammatically correct sentences with 40% accuracy x5 completed. Of note improved with continued attempted sentences as errors were only on initial x3 sentences. Patient demonstrated emerging error identification and correction in 2/3 error sentences.   ?  ? Assessment / Recommendations / Plan  ? Plan Continue with current plan of care   ? ?  ?  ? ?  ? ? ? SLP Education - 01/21/22 1420   ? ? Education  Details avoid "glossing over" words you dont/cant say   ? Person(s) Educated Patient   ? Methods Explanation;Demonstration   ? Comprehension Verbalized understanding;Returned demonstration;Need further instruction   ? ?  ?  ? ?  ? ? ? SLP Short Term Goals - 12/31/21 0715   ? ?  ? SLP SHORT TERM GOAL #1  ? Title Pt will produce sentences in response to a question (e.g. what?s your favorite holiday and why) with appropriate articulation at 75% accuracy given frequent moderate phonemic placement cues.   ? Time 10   ? Period --   sessions  ? Status On-going   ?  ? SLP SHORT TERM GOAL #2  ? Title Pt will expressively produce 4+ word sentence in response to situation/picture with 50% accuracy given moderate cues in order to increase ability to communicate basic wants needs.   ? Time 10   ? Period --   sessions  ? Status On-going   ?  ? SLP SHORT TERM GOAL #3  ? Title Pt  will write grammatically correct 5 word sentence about a basic picture with minimal assistance.   ? Time 10   ? Period --   sessions  ? Status On-going   ?  ? SLP SHORT TERM GOAL #4  ? Title Pt will initi

## 2022-01-21 NOTE — Telephone Encounter (Signed)
No change per Dr. Quentin Ore.  Will continue to monitor. ?

## 2022-01-26 ENCOUNTER — Ambulatory Visit: Payer: Medicare Other | Admitting: Speech Pathology

## 2022-01-28 ENCOUNTER — Ambulatory Visit: Payer: Medicare Other | Admitting: Speech Pathology

## 2022-01-28 DIAGNOSIS — R482 Apraxia: Secondary | ICD-10-CM

## 2022-01-28 DIAGNOSIS — R4701 Aphasia: Secondary | ICD-10-CM

## 2022-01-28 DIAGNOSIS — I63412 Cerebral infarction due to embolism of left middle cerebral artery: Secondary | ICD-10-CM

## 2022-01-30 NOTE — Therapy (Signed)
[place] ?[facility] ?[place], [place], [address] ?Phone: [phone]   Fax:  [phone] ? ?Speech Language Pathology Treatment ? ?Patient Details  ?Name: Garrett Hunter ?MRN: [id number] ?Date of Birth: [birth year] ?Referring Provider (SLP): Derinda Late ? ? ?Encounter Date: 01/28/2022 ? ? End of Session - 01/30/22 1927   ? ? Visit Number 27   ? Number of Visits 44   ? Date for SLP Re-Evaluation 03/25/22   ? Authorization Type Medicare   ? Authorization Time Period 12/29/2021 thru 03/25/2022   ? Authorization - Visit Number 7   ? Progress Note Due on Visit 10   ? SLP Start Time 1300   ? SLP Stop Time  1400   ? SLP Time Calculation (min) 60 min   ? Activity Tolerance Patient tolerated treatment well   ? ?  ?  ? ?  ? ? ?Past Medical History:  ?Diagnosis Date  ? AAA (abdominal aortic aneurysm)   ? thoracoabdominal aortic aneursym, s/p right ilio-femoral bypass 10/12/16; 3V FEVAR with SMA, RRA, and LRA stenting 11/16/16 by Dr. Sammuel Hines [facility])  ? Benign neoplasm of colon   ? patient not sure  ? Chronic airway obstruction, not elsewhere classified   ? pt denies  ? 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 a. 01/2021 Echo: EF 45-50%, mild LVH, GrII DD, basal inf AK, nl RV fxn, mildly dil LA, triv MR. Ao root 56m.  ? Coronary artery disease   ? a. 1996 Inf MI: TPA; b. 2011 Cath: CTO pRCA/pLCX; c. 07/2012 NSTEMI/Cath (HI): plaque rupture in pRamius (stenting); d. 01/2021 Cath (in setting of VT): LM nl, LAD min irregs, RI patent stent, LCX 100p, OM2 fills via collats from D1, LPL1 - collats from dLAD, RCA 100p, RPDA - collats from Sept1/2.  ? Dilated aortic root ([place]   ? a. 01/2021 Echo: Ao root 377m  ? Gastric ulcer   ? a. 07/2018 s/p cauterization.  ? Hepatitis A   ? teenager, no longer a problem  ? Hyperlipidemia   ? Intolerance to statins.  ? Hypertension   ? Left carotid stenosis   ? a. 02/2021 s/p9 x 40 Enroute L carotid stent.  ?  MI (myocardial infarction) ([place]  ? x 3 - last one 2013  ? Renal artery stenosis ([place]  ? a. 07/2018 s/p bilateral renal artery angioplasty.  ? [facility]  ? Ventricular tachycardia   ? a. 01/2021 s/p DCCV; b. 05/2021 s/p Biotronik Acticor 7 VR-T DX AICD (ser# [id number]  ? ? ?Past Surgical History:  ?Procedure Laterality Date  ? ABDOMINAL AORTIC ANEURYSM REPAIR    ? 11/16/2016  ? ANGIOPLASTY    ? CARDIAC CATHETERIZATION  2011  ? CARDIAC CATHETERIZATION  2/14  ? [facility]; 95% lesion in the ramus intermedius.   ? CATARACT EXTRACTION W/PHACO Left 12/19/2018  ? Procedure: CATARACT EXTRACTION PHACO AND INTRAOCULAR LENS PLACEMENT ([place] LEFT;  Surgeon: KiEulogio BearMD;  [place] Service: Ophthalmology;  Laterality: Left;  ? CATARACT EXTRACTION W/PHACO Right 03/06/2019  ? Procedure: CATARACT EXTRACTION PHACO AND INTRAOCULAR LENS PLACEMENT (IOC)RIGHT;  Surgeon: KiEulogio BearMD;  [place] Service: Ophthalmology;  Laterality: Right;  ? COLONOSCOPY WITH PROPOFOL N/A 03/20/2021  ? Procedure: COLONOSCOPY WITH PROPOFOL;  Surgeon: VaLin LandsmanMD;  [facility];  Service: Gastroenterology;  Laterality: N/A;  ? CORONARY ANGIOPLASTY  1996  ? [facility] x1  ? CORONARY ANGIOPLASTY  2/14  ? Severe restenosis in the ostial ramus. Status post angioplasty and drug-eluting stent placement with a 2.5 x 18 mm Xience drug-eluting stent  ? ESOPHAGOGASTRODUODENOSCOPY N/A 08/03/2018  ? Procedure: ESOPHAGOGASTRODUODENOSCOPY (EGD);  Surgeon: Lin Landsman, MD;  Location: [facility] ENDOSCOPY;  Service: Gastroenterology;  Laterality: N/A;  ? ESOPHAGOGASTRODUODENOSCOPY (EGD) WITH PROPOFOL N/A 11/03/2018  ? Procedure: ESOPHAGOGASTRODUODENOSCOPY (EGD) WITH PROPOFOL;  Surgeon: Lin Landsman, MD;  Location: [facility] ENDOSCOPY;  Service: Gastroenterology;  Laterality: N/A;  ? ESOPHAGOGASTRODUODENOSCOPY (EGD) WITH PROPOFOL N/A 12/06/2018  ? Procedure: ESOPHAGOGASTRODUODENOSCOPY  (EGD) WITH PROPOFOL;  Surgeon: Lucilla Lame, MD;  Location[facility] END[facility]COPY;  Service: Endoscopy;  Laterality: N/A;  ? ESOPHAGOGASTRODUODENOSCOPY (EGD) WITH PROPOFOL N/A 03/19/2021  ? Procedure: ESOPHAGOGASTRODUODENOSCOPY (EGD) WITH PROPOFOL;  Surgeon: Lin Landsman, MD;  Location: [facility] ENDOSCOPY;  Service: Gastroenterology;  Laterality: N/A;  ? ICD IMPLANT N/A 05/14/2021  ? Procedure: ICD IMPLANT;  Surgeon: [name], MD;  Location: [place] CV LAB;  Service: Cardiovascular;  Laterality: N/A;  ? IR CT HEAD LTD  01/30/2021  ? IR PERCUTANEOUS ART THROMBECTOMY/INFUSION INTRACRANIAL INC DIAG ANGIO  01/30/2021  ? LEFT HEART CATH AND CORONARY ANGIOGRAPHY N/A 01/28/2021  ? Procedure: LEFT HEART CATH AND CORONARY ANGIOGRAPHY;  Surgeon: Nelva Bush, MD;  Location: [place] CV LAB;  Service: Cardiovascular;  Laterality: N/A;  ? RADIOLOGY WITH ANESTHESIA N/A 01/30/2021  ? Procedure: IR WITH ANESTHESIA;  Surgeon: Luanne Bras, MD;  Location: [place];  Service: Radiology;  Laterality: N/A;  ? TONSILLECTOMY AND ADENOIDECTOMY    ? TRANSCAROTID ARTERY REVASCULARIZATION?  Left 02/07/2021  ? Procedure: TRANSCAROTID ARTERY REVASCULARIZATION LEFT;  Surgeon: [name], MD;  Location[facility] OR;  Service: Vascular;  Laterality: Left;  ? ? ?There were no vitals filed for this visit. ? ? Subjective Assessment - 01/30/22 1923   ? ? Subjective pt pleasant, eager, conversant   ? Currently in Pain? No/denies   ? ?  ?  ? ?  ? ? ? ? ? ? ? ? ADULT SLP TREATMENT - 01/30/22 0001   ? ?  ? Treatment Provided  ? Treatment provided Cognitive-Linquistic   ?  ? Cognitive-Linquistic Treatment  ? 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 Skilled Treatment Skilled treatment session focused on re-evaluating pt's language abilities using the Western Aphasia Battery.  ? ?Spontaneous Speech Score: 18/20 improved over 10/08/2021 ?Information content 9/10 ?Fluency 9/10 ? ?Auditory Verbal Comprehension  Score: 10/10 ?Yes/No questions 60/60 ?Auditory Word Recognition 60/60 ?Sequential Commands 80/80 ? ?Repetition Score: 9.2/10 ? ?Naming and Word Finding Score: 10/10 ?Object Naming 60/60 ?Word Fluency 20/20 ?Sentence Completion 10/10 ?Responsive Speech 10/10 ? 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 ?Reading Score: 20/20 ?Writing Score: 20/20 ?Language Quotient 97.20/100 - much improved over previous score of 54/100 (10/08/2021) ? ?  ? ?  ?  ? ?  ? ? ? SLP Education - 01/30/22 1926   ? ? Education Details progress on the Western Aphasia Battery   ? Person(s) Educated Patient   ? Methods Explanation   ? Comprehension Verbalized understanding;Returned demonstration   ? ?  ?  ? ?  ? ? ? SLP Short Term Goals - 12/31/21 0715   ? ?  ? SLP SHORT TERM GOAL #1  ? Title Pt will produce sentences in response to a question (e.g. what?s your favorite holiday and why) with appropriate articulation  at 75% accuracy given frequent moderate phonemic placement cues.   ? Time 10   ? Period --   sessions  ? Status On-going   ?  ? SLP SHORT TERM GOAL #2  ? Title Pt will expressively produce 4+ word sentence in response to situation/picture with 50% accuracy given moderate cues in order to increase ability to communicate basic wants needs.   ? Time 10   ? Period --   sessions  ? Status On-going   ?  ? SLP SHORT TERM GOAL #3  ? Title Pt will write grammatically correct 5 word sentence about a basic picture with minimal assistance.   ? Time 10   ? Period --   sessions  ? Status On-going   ?  ? SLP SHORT TERM GOAL #4  ? Title Pt will initiate use of compensatory strategy to repair moments of communication breakdown 2 times in a session.   ? Time 10   ? Period --   sessions  ? Status On-going   ? ?  ?  ? ?  ? ? ? SLP Long Term Goals - 01/21/22 1424   ? ?  ? SLP LONG TERM GOAL #1  ? Title Pt will use functional communication skills for social interaction (e.g., greetings,  social  etiquette, and short questions/simple sentences) with both familiar and unfamiliar  partners with 90% success.   ? ?  ?  ? ?  ? ? ? Plan - 01/30/22 1927   ? ? Clinical Impression Statement Pt with good

## 2022-02-02 ENCOUNTER — Ambulatory Visit: Payer: Medicare Other | Admitting: Speech Pathology

## 2022-02-04 ENCOUNTER — Ambulatory Visit: Payer: Medicare Other | Attending: Family Medicine | Admitting: Speech Pathology

## 2022-02-04 DIAGNOSIS — R482 Apraxia: Secondary | ICD-10-CM | POA: Insufficient documentation

## 2022-02-04 DIAGNOSIS — I63412 Cerebral infarction due to embolism of left middle cerebral artery: Secondary | ICD-10-CM | POA: Insufficient documentation

## 2022-02-04 DIAGNOSIS — R4701 Aphasia: Secondary | ICD-10-CM | POA: Insufficient documentation

## 2022-02-04 NOTE — Patient Instructions (Signed)
Utilize strategies at home/in community.  ?When people provide a word during patient errors--attempt to repeat the word for increased practice and production next attempt  ?

## 2022-02-04 NOTE — Therapy (Signed)
[place] [facility] ?[place], [place], [address] ?Phone: [phone]   Fax:  [phone] ? ?Speech Language Pathology Treatment ? ?Patient Details  ?Name: Garrett Hunter ?MRN: [id number] ?Date of Birth: 07/23/1946 ?Referring Provider (SLP): Derinda Late ? ? ?Encounter Date: 02/04/2022 ? ? End of Session - 02/04/22 1723   ? ? Visit Number 28   ? Number of Visits 44   ? Date for SLP Re-Evaluation 03/25/22   ? Authorization Type Medicare   ? Authorization Time Period 12/29/2021 thru 03/25/2022   ? Authorization - Visit Number 8   ? Progress Note Due on Visit 10   ? SLP Start Time 1300   ? SLP Stop Time  1400   ? SLP Time Calculation (min) 60 min   ? Activity Tolerance Patient tolerated treatment well   ? ?  ?  ? ?  ? ? ?Past Medical History:  ?Diagnosis Date  ? AAA (abdominal aortic aneurysm)   ? thoracoabdominal aortic aneursym, s/p right ilio-femoral bypass 10/12/16; 3V FEVAR with SMA, RRA, and LRA stenting 11/16/16 by Dr. Sammuel Hines [facility])  ? Benign neoplasm of colon   ? patient not sure  ? Chronic airway obstruction, not elsewhere classified   ? pt denies  ? 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 a. 01/2021 Echo: EF 45-50%, mild LVH, GrII DD, basal inf AK, nl RV fxn, mildly dil LA, triv MR. Ao root 93m.  ? Coronary artery disease   ? a. 1996 Inf MI: TPA; b. 2011 Cath: CTO pRCA/pLCX; c. 07/2012 NSTEMI/Cath (HI): plaque rupture in pRamius (stenting); d. 01/2021 Cath (in setting of VT): LM nl, LAD min irregs, RI patent stent, LCX 100p, OM2 fills via collats from D1, LPL1 - collats from dLAD, RCA 100p, RPDA - collats from Sept1/2.  ? Dilated aortic root ([place]   ? a. 01/2021 Echo: Ao root 37m  ? Gastric ulcer   ? a. 07/2018 s/p cauterization.  ? Hepatitis A   ? teenager, no longer a problem  ? Hyperlipidemia   ? Intolerance to statins.  ? Hypertension   ? Left carotid stenosis   ? a. 02/2021 s/p9 x 40 Enroute L carotid stent.  ?  MI (myocardial infarction) ([place]  ? x 3 - last one 2013  ? Renal artery stenosis ([place]  ? a. 07/2018 s/p bilateral renal artery angioplasty.  ? [facility]  ? Ventricular tachycardia   ? a. 01/2021 s/p DCCV; b. 05/2021 s/p Biotronik Acticor 7 VR-T DX AICD (ser# [id number]  ? ? ?Past Surgical History:  ?Procedure Laterality Date  ? ABDOMINAL AORTIC ANEURYSM REPAIR    ? 11/16/2016  ? ANGIOPLASTY    ? CARDIAC CATHETERIZATION  2011  ? CARDIAC CATHETERIZATION  2/14  ? [facility]; 95% lesion in the ramus intermedius.   ? CATARACT EXTRACTION W/PHACO Left 12/19/2018  ? Procedure: CATARACT EXTRACTION PHACO AND INTRAOCULAR LENS PLACEMENT ([place] LEFT;  Surgeon: KiEulogio BearMD;  [place] Service: Ophthalmology;  Laterality: Left;  ? CATARACT EXTRACTION W/PHACO Right 03/06/2019  ? Procedure: CATARACT EXTRACTION PHACO AND INTRAOCULAR LENS PLACEMENT (IOC)RIGHT;  Surgeon: KiEulogio BearMD;  [place] Service: Ophthalmology;  Laterality: Right;  ? COLONOSCOPY WITH PROPOFOL N/A 03/20/2021  ? Procedure: COLONOSCOPY WITH PROPOFOL;  Surgeon: VaLin LandsmanMD;  [facility];  Service: Gastroenterology;  Laterality: N/A;  ? CORONARY ANGIOPLASTY  1996  ? [facility] x1  ? CORONARY ANGIOPLASTY  2/14  ? Severe restenosis in the ostial ramus. Status post angioplasty and drug-eluting stent placement with a 2.5 x 18 mm Xience drug-eluting stent  ? ESOPHAGOGASTRODUODENOSCOPY N/A 08/03/2018  ? Procedure: ESOPHAGOGASTRODUODENOSCOPY (EGD);  Surgeon: Lin Landsman, MD;  Location: [facility] ENDOSCOPY;  Service: Gastroenterology;  Laterality: N/A;  ? ESOPHAGOGASTRODUODENOSCOPY (EGD) WITH PROPOFOL N/A 11/03/2018  ? Procedure: ESOPHAGOGASTRODUODENOSCOPY (EGD) WITH PROPOFOL;  Surgeon: Lin Landsman, MD;  Location: [facility] ENDOSCOPY;  Service: Gastroenterology;  Laterality: N/A;  ? ESOPHAGOGASTRODUODENOSCOPY (EGD) WITH PROPOFOL N/A 12/06/2018  ? Procedure: ESOPHAGOGASTRODUODENOSCOPY  (EGD) WITH PROPOFOL;  Surgeon: Lucilla Lame, MD;  Location[facility] END[facility]COPY;  Service: Endoscopy;  Laterality: N/A;  ? ESOPHAGOGASTRODUODENOSCOPY (EGD) WITH PROPOFOL N/A 03/19/2021  ? Procedure: ESOPHAGOGASTRODUODENOSCOPY (EGD) WITH PROPOFOL;  Surgeon: Lin Landsman, MD;  [facility] ENDOSCOPY;  Service: Gastroenterology;  Laterality: N/A;  ? ICD IMPLANT N/A 05/14/2021  ? Procedure: ICD IMPLANT;  Surgeon: [name], MD;  Location: [place] CV LAB;  Service: Cardiovascular;  Laterality: N/A;  ? IR CT HEAD LTD  01/30/2021  ? IR PERCUTANEOUS ART THROMBECTOMY/INFUSION INTRACRANIAL INC DIAG ANGIO  01/30/2021  ? LEFT HEART CATH AND CORONARY ANGIOGRAPHY N/A 01/28/2021  ? Procedure: LEFT HEART CATH AND CORONARY ANGIOGRAPHY;  Surgeon: Nelva Bush, MD;  Location: [place] CV LAB;  Service: Cardiovascular;  Laterality: N/A;  ? RADIOLOGY WITH ANESTHESIA N/A 01/30/2021  ? Procedure: IR WITH ANESTHESIA;  Surgeon: Luanne Bras, MD;  Location: [place];  Service: Radiology;  Laterality: N/A;  ? TONSILLECTOMY AND ADENOIDECTOMY    ? TRANSCAROTID ARTERY REVASCULARIZATION?  Left 02/07/2021  ? Procedure: TRANSCAROTID ARTERY REVASCULARIZATION LEFT;  Surgeon: [name], MD;  [facility] OR;  Service: Vascular;  Laterality: Left;  ? ? ?There were no vitals filed for this visit. ? ? Subjective Assessment - 02/04/22 1716   ? ? Subjective pt pleasant, eager, conversant   ? Currently in Pain? No/denies   ? ?  ?  ? ?  ? ? ? ? ? ? ? ? ADULT SLP TREATMENT - 02/04/22 0001   ? ?  ? Treatment Provided  ? Treatment provided Cognitive-Linquistic   ?  ? Cognitive-Linquistic Treatment  ? 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 Patient verbalized multi-syllabic words with appropriate aticulation with 40% accuracy x10 improved to 100% accuracy  with visual breakdown, visual model, and simulataneous production. Visual breakdown included slitting the word into syllabic parts. Patient verbally read 8-9word sentences with 75% accuracy x16 improved to 100% with visual aid and simultaneous production. Throughout patient cued for reduced rate which patient carried over best with verbal responses to questions as patient demonstrated appropriate articulation for intelligibility with 80% accuracy x2 trials of x10 responses each, patient improved to 100% accuracy with min cues for slowed production.   ?  ? Assessment / Recommendations / Plan  ? Plan Continue with current plan of care   ? ?  ?  ? ?  ? ? ? SLP Education - 02/04/22 1723   ? ? Education Details strategies and use at home   ? Person(s) Educated Patient   ? Methods Explanation;Demonstration   ? Comprehension Verbalized understanding   ? ?  ?  ? ?  ? ? ? SLP Short Term Goals - 12/31/21 0715   ? ?  ? SLP SHORT TERM  GOAL #1  ? Title Pt will produce sentences in response to a question (e.g. what?s your favorite holiday and why) with appropriate articulation at 75% accuracy given frequent moderate phonemic placement cues.   ? Time 10   ? Period --   sessions  ? Status On-going   ?  ? SLP SHORT TERM GOAL #2  ? Title Pt will expressively produce 4+ word sentence in response to situation/picture with 50% accuracy given moderate cues in order to increase ability to communicate basic wants needs.   ? Time 10   ? Period --   sessions  ? Status On-going   ?  ? SLP SHORT TERM GOAL #3  ? Title Pt will write grammatically correct 5 word sentence about a basic picture with minimal assistance.   ? Time 10   ? Period --   sessions  ? Status On-going   ?  ? SLP SHORT TERM GOAL #4  ? Title Pt will initiate use of compensatory strategy to repair moments of communication breakdown 2 times in a session.   ? Time 10   ? Period --   sessions  ? Status On-going   ? ?  ?  ? ?  ? ? ? SLP Long Term Goals - 01/21/22 1424   ? ?  ? SLP  LONG TERM GOAL #1  ? Title Pt will use functional communication skills for social interaction (e.g., greetings,  social etiquette, and short questions/simple sentences) with both familiar and unfamiliar  par

## 2022-02-09 ENCOUNTER — Ambulatory Visit: Payer: Medicare Other | Admitting: Speech Pathology

## 2022-02-09 DIAGNOSIS — R482 Apraxia: Secondary | ICD-10-CM | POA: Diagnosis not present

## 2022-02-09 DIAGNOSIS — I63412 Cerebral infarction due to embolism of left middle cerebral artery: Secondary | ICD-10-CM

## 2022-02-10 ENCOUNTER — Encounter: Payer: Self-pay | Admitting: Cardiology

## 2022-02-10 NOTE — Therapy (Signed)
[place] [facility] ?[place], [place], [address] ?Phone: [phone]   Fax:  [phone] ? ?Speech Language Pathology Treatment ? ?Patient Details  ?Name: Garrett Hunter ?MRN: [id number] ?Date of Birth: [birth year] ?Referring Provider (SLP): Derinda Late ? ? ?Encounter Date: 02/09/2022 ? ? End of Session - 02/10/22 2124   ? ? Visit Number 29   ? Number of Visits 44   ? Date for SLP Re-Evaluation 03/25/22   ? Authorization Type Medicare   ? Authorization Time Period 12/29/2021 thru 03/25/2022   ? Authorization - Visit Number 9   ? Progress Note Due on Visit 10   ? SLP Start Time 1300   ? SLP Stop Time  1400   ? SLP Time Calculation (min) 60 min   ? Activity Tolerance Patient tolerated treatment well   ? ?  ?  ? ?  ? ? ?Past Medical History:  ?Diagnosis Date  ? AAA (abdominal aortic aneurysm)   ? thoracoabdominal aortic aneursym, s/p right ilio-femoral bypass 10/12/16; 3V FEVAR with SMA, RRA, and LRA stenting 11/16/16 by Dr. Sammuel Hines [facility])  ? Benign neoplasm of colon   ? patient not sure  ? Chronic airway obstruction, not elsewhere classified   ? pt denies  ? 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 a. 01/2021 Echo: EF 45-50%, mild LVH, GrII DD, basal inf AK, nl RV fxn, mildly dil LA, triv MR. Ao root 76m.  ? Coronary artery disease   ? a. 1996 Inf MI: TPA; b. 2011 Cath: CTO pRCA/pLCX; c. 07/2012 NSTEMI/Cath (HI): plaque rupture in pRamius (stenting); d. 01/2021 Cath (in setting of VT): LM nl, LAD min irregs, RI patent stent, LCX 100p, OM2 fills via collats from D1, LPL1 - collats from dLAD, RCA 100p, RPDA - collats from Sept1/2.  ? Dilated aortic root ([place]   ? a. 01/2021 Echo: Ao root 338m  ? Gastric ulcer   ? a. 07/2018 s/p cauterization.  ? Hepatitis A   ? teenager, no longer a problem  ? Hyperlipidemia   ? Intolerance to statins.  ? Hypertension   ? Left carotid stenosis   ? a. 02/2021 s/p9 x 40 Enroute L carotid stent.  ?  MI (myocardial infarction) ([place]  ? x 3 - last one 2013  ? Renal artery stenosis ([place]  ? a. 07/2018 s/p bilateral renal artery angioplasty.  ? [facility]  ? Ventricular tachycardia   ? a. 01/2021 s/p DCCV; b. 05/2021 s/p Biotronik Acticor 7 VR-T DX AICD (ser# [id number]  ? ? ?Past Surgical History:  ?Procedure Laterality Date  ? ABDOMINAL AORTIC ANEURYSM REPAIR    ? 11/16/2016  ? ANGIOPLASTY    ? CARDIAC CATHETERIZATION  2011  ? CARDIAC CATHETERIZATION  2/14  ? [facility]; 95% lesion in the ramus intermedius.   ? CATARACT EXTRACTION W/PHACO Left 12/19/2018  ? Procedure: CATARACT EXTRACTION PHACO AND INTRAOCULAR LENS PLACEMENT ([place] LEFT;  Surgeon: KiEulogio BearMD;  [place] Service: Ophthalmology;  Laterality: Left;  ? CATARACT EXTRACTION W/PHACO Right 03/06/2019  ? Procedure: CATARACT EXTRACTION PHACO AND INTRAOCULAR LENS PLACEMENT (IOC)RIGHT;  Surgeon: KiEulogio BearMD;  Location: [place] Service: Ophthalmology;  Laterality: Right;  ? COLONOSCOPY WITH PROPOFOL N/A 03/20/2021  ? Procedure: COLONOSCOPY WITH PROPOFOL;  Surgeon: VaLin LandsmanMD;  [facility];  Service: Gastroenterology;  Laterality: N/A;  ? CORONARY ANGIOPLASTY  1996  ? [facility] x1  ? CORONARY ANGIOPLASTY  2/14  ? Severe restenosis in the ostial ramus. Status post angioplasty and drug-eluting stent placement with a 2.5 x 18 mm Xience drug-eluting stent  ? ESOPHAGOGASTRODUODENOSCOPY N/A 08/03/2018  ? Procedure: ESOPHAGOGASTRODUODENOSCOPY (EGD);  Surgeon: Lin Landsman, MD;  Location: [facility] ENDOSCOPY;  Service: Gastroenterology;  Laterality: N/A;  ? ESOPHAGOGASTRODUODENOSCOPY (EGD) WITH PROPOFOL N/A 11/03/2018  ? Procedure: ESOPHAGOGASTRODUODENOSCOPY (EGD) WITH PROPOFOL;  Surgeon: Lin Landsman, MD;  Location: [facility] ENDOSCOPY;  Service: Gastroenterology;  Laterality: N/A;  ? ESOPHAGOGASTRODUODENOSCOPY (EGD) WITH PROPOFOL N/A 12/06/2018  ? Procedure: ESOPHAGOGASTRODUODENOSCOPY  (EGD) WITH PROPOFOL;  Surgeon: Lucilla Lame, MD;  Location[facility] ENDOSCOPY;  Service: Endoscopy;  Laterality: N/A;  ? ESOPHAGOGASTRODUODENOSCOPY (EGD) WITH PROPOFOL N/A 03/19/2021  ? Procedure: ESOPHAGOGASTRODUODENOSCOPY (EGD) WITH PROPOFOL;  Surgeon: Lin Landsman, MD;  Location: [facility] ENDOSCOPY;  Service: Gastroenterology;  Laterality: N/A;  ? ICD IMPLANT N/A 05/14/2021  ? Procedure: ICD IMPLANT;  Surgeon: [name], MD;  Location: [place] CV LAB;  Service: Cardiovascular;  Laterality: N/A;  ? IR CT HEAD LTD  01/30/2021  ? IR PERCUTANEOUS ART THROMBECTOMY/INFUSION INTRACRANIAL INC DIAG ANGIO  01/30/2021  ? LEFT HEART CATH AND CORONARY ANGIOGRAPHY N/A 01/28/2021  ? Procedure: LEFT HEART CATH AND CORONARY ANGIOGRAPHY;  Surgeon: Nelva Bush, MD;  Location: [place] CV LAB;  Service: Cardiovascular;  Laterality: N/A;  ? RADIOLOGY WITH ANESTHESIA N/A 01/30/2021  ? Procedure: IR WITH ANESTHESIA;  Surgeon: Luanne Bras, MD;  Location: [place];  Service: Radiology;  Laterality: N/A;  ? TONSILLECTOMY AND ADENOIDECTOMY    ? TRANSCAROTID ARTERY REVASCULARIZATION?  Left 02/07/2021  ? Procedure: TRANSCAROTID ARTERY REVASCULARIZATION LEFT;  Surgeon: [name], MD;  Location: [facility] OR;  Service: Vascular;  Laterality: Left;  ? ? ?There were no vitals filed for this visit. ? ? Subjective Assessment - 02/10/22 2119   ? ? Subjective "I was telling Lelon Frohlich, when I try to spell words in text messages, I don't know how to begin the words or the letters in the words"   ? Currently in Pain? No/denies   ? ?  ?  ? ?  ? ? ? ? ? ? ? ? ADULT SLP TREATMENT - 02/10/22 0001   ? ?  ? Treatment Provided  ? Treatment provided Cognitive-Linquistic   ?  ? Cognitive-Linquistic Treatment  ? 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   ? ?SLP facilitated session by providing the following interventions:     ? ?Apraxia of Speech: Reading drill words starting 90% accuracy with single syllable progressing to bisylable words - unable to use tapping with 3 syllable words - attempted writing each word, unsuccessful and then had pt write word according to syllables, pt required maximal support to perform; suspect this inability results in difficulty achieving correct motor patterns   ? ?  ?  ? ?  ? ? ? SLP Education - 02/10/22 2123   ? ? Education Details incorperating writing into apraxia drills   ? Person(s) Educated Patient   ? Methods Explanation;Demonstration;Verbal cues;Tactile cues   ? Comprehension Need further instruction;Verbal cues required;Tactile cues required   ? ?  ?  ? ?  ? ? ? SLP Short Term Goals - 12/31/21 0715   ? ?  ? SLP SHORT TERM GOAL #1  ? Title Pt will produce sentences  in response to a question (e.g. what?s your favorite holiday and why) with appropriate articulation at 75% accuracy given frequent moderate phonemic placement cues.   ? Time 10   ? Period --   sessions  ? Status On-going   ?  ? SLP SHORT TERM GOAL #2  ? Title Pt will expressively produce 4+ word sentence in response to situation/picture with 50% accuracy given moderate cues in order to increase ability to communicate basic wants needs.   ? Time 10   ? Period --   sessions  ? Status On-going   ?  ? SLP SHORT TERM GOAL #3  ? Title Pt will write grammatically correct 5 word sentence about a basic picture with minimal assistance.   ? Time 10   ? Period --   sessions  ? Status On-going   ?  ? SLP SHORT TERM GOAL #4  ? Title Pt will initiate use of compensatory strategy to repair moments of communication breakdown 2 times in a session.   ? Time 10   ? Period --   sessions  ? Status On-going   ? ?  ?  ? ?  ? ? ? SLP Long Term Goals - 01/21/22 1424   ? ?  ? SLP LONG TERM GOAL #1  ? Title Pt will use functional communication skills for social interaction (e.g.,  greetings,  social etiquette, and short questions/simple sentences) with both familiar and unfamiliar  partners with 90% success.   ? ?  ?  ? ?  ? ? ? Plan - 02/10/22 2125   ? ? Clinical Impression Statement Pt pr

## 2022-02-11 ENCOUNTER — Ambulatory Visit (INDEPENDENT_AMBULATORY_CARE_PROVIDER_SITE_OTHER): Payer: Medicare Other

## 2022-02-11 ENCOUNTER — Ambulatory Visit: Payer: Medicare Other | Admitting: Speech Pathology

## 2022-02-11 DIAGNOSIS — I472 Ventricular tachycardia, unspecified: Secondary | ICD-10-CM

## 2022-02-11 DIAGNOSIS — R482 Apraxia: Secondary | ICD-10-CM

## 2022-02-11 DIAGNOSIS — I63412 Cerebral infarction due to embolism of left middle cerebral artery: Secondary | ICD-10-CM

## 2022-02-11 DIAGNOSIS — I255 Ischemic cardiomyopathy: Secondary | ICD-10-CM

## 2022-02-11 NOTE — Patient Instructions (Signed)
Practice reading words using syllables and clapping if needed ?

## 2022-02-11 NOTE — Therapy (Signed)
[place] [facility] ?[place], [place], [address] ?Phone: [phone]   Fax:  [phone] ? ?Speech Language Pathology Treatment ?Progress note ? ?Patient Details  ?Name: Garrett Hunter ?MRN: [id number] ?Date of Birth: 08-11-1946 ?Referring Provider (SLP): Derinda Late ? ? ?Encounter Date: 02/11/2022 ? ? End of Session - 02/11/22 2053   ? ? Visit Number 30   ? Number of Visits 44   ? Date for SLP Re-Evaluation 03/25/22   ? Authorization Type Medicare   ? Authorization Time Period 12/29/2021 thru 03/25/2022   ? Authorization - Visit Number 10   ? Progress Note Due on Visit 10   ? SLP Start Time 1300   ? SLP Stop Time  1400   ? SLP Time Calculation (min) 60 min   ? Activity Tolerance Patient tolerated treatment well   ? ?  ?  ? ?  ? ? ?Past Medical History:  ?Diagnosis Date  ? AAA (abdominal aortic aneurysm)   ? thoracoabdominal aortic aneursym, s/p right ilio-femoral bypass 10/12/16; 3V FEVAR with SMA, RRA, and LRA stenting 11/16/16 by Dr. Sammuel Hines [facility])  ? Benign neoplasm of colon   ? patient not sure  ? Chronic airway obstruction, not elsewhere classified   ? pt denies  ? 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 a. 01/2021 Echo: EF 45-50%, mild LVH, GrII DD, basal inf AK, nl RV fxn, mildly dil LA, triv MR. Ao root 52m.  ? Coronary artery disease   ? a. 1996 Inf MI: TPA; b. 2011 Cath: CTO pRCA/pLCX; c. 07/2012 NSTEMI/Cath (HI): plaque rupture in pRamius (stenting); d. 01/2021 Cath (in setting of VT): LM nl, LAD min irregs, RI patent stent, LCX 100p, OM2 fills via collats from D1, LPL1 - collats from dLAD, RCA 100p, RPDA - collats from Sept1/2.  ? Dilated aortic root ([place]   ? a. 01/2021 Echo: Ao root 330m  ? Gastric ulcer   ? a. 07/2018 s/p cauterization.  ? Hepatitis A   ? teenager, no longer a problem  ? Hyperlipidemia   ? Intolerance to statins.  ? Hypertension   ? Left carotid stenosis   ? a. 02/2021 s/p9 x 40 Enroute L  carotid stent.  ? [place] ([place]ocardial infarction) ([place]  ? x 3 - last one 2013  ? Renal artery stenosis ([place]  ? a. 07/2018 s/p bilateral renal artery angioplasty.  ? [facility]  ? Ventricular tachycardia   ? a. 01/2021 s/p DCCV; b. 05/2021 s/p Biotronik Acticor 7 VR-T DX AICD (ser# [id number]  ? ? ?Past Surgical History:  ?Procedure Laterality Date  ? ABDOMINAL AORTIC ANEURYSM REPAIR    ? 11/16/2016  ? ANGIOPLASTY    ? CARDIAC CATHETERIZATION  2011  ? CARDIAC CATHETERIZATION  2/14  ? [facility]; 95% lesion in the ramus intermedius.   ? CATARACT EXTRACTION W/PHACO Left 12/19/2018  ? Procedure: CATARACT EXTRACTION PHACO AND INTRAOCULAR LENS PLACEMENT ([place] LEFT;  Surgeon: KiEulogio BearMD;  [place] Service: Ophthalmology;  Laterality: Left;  ? CATARACT EXTRACTION W/PHACO Right 03/06/2019  ? Procedure: CATARACT EXTRACTION PHACO AND INTRAOCULAR LENS PLACEMENT (IOC)RIGHT;  Surgeon: KiEulogio BearMD;  [place] Service: Ophthalmology;  Laterality: Right;  ? COLONOSCOPY WITH PROPOFOL N/A 03/20/2021  ? Procedure: COLONOSCOPY WITH PROPOFOL;  Surgeon: VaLin LandsmanMD;  [facility];  Service: Gastroenterology;  Laterality: N/A;  ? CORONARY  ANGIOPLASTY  1996  ? [facility] x1  ? CORONARY ANGIOPLASTY  2/14  ? Severe restenosis in the ostial ramus. Status post angioplasty and drug-eluting stent placement with a 2.5 x 18 mm Xience drug-eluting stent  ? ESOPHAGOGASTRODUODENOSCOPY N/A 08/03/2018  ? Procedure: ESOPHAGOGASTRODUODENOSCOPY (EGD);  Surgeon: Lin Landsman, MD;  [facility] END[facility]COPY;  Service: Gastroenterology;  Laterality: N/A;  ? ESOPHAGOGASTRODUODENOSCOPY (EGD) WITH PROPOFOL N/A 11/03/2018  ? Procedure: ESOPHAGOGASTRODUODENOSCOPY (EGD) WITH PROPOFOL;  Surgeon: Lin Landsman, MD;  [facility] END[facility]COPY;  Service: Gastroenterology;  Laterality: N/A;  ? ESOPHAGOGASTRODUODENOSCOPY (EGD) WITH PROPOFOL N/A 12/06/2018  ? Procedure:  ESOPHAGOGASTRODUODENOSCOPY (EGD) WITH PROPOFOL;  Surgeon: Lucilla Lame, MD;  Location[facility] ENDOSCOPY;  Service: Endoscopy;  Laterality: N/A;  ? ESOPHAGOGASTRODUODENOSCOPY (EGD) WITH PROPOFOL N/A 03/19/2021  ? Procedure: ESOPHAGOGASTRODUODENOSCOPY (EGD) WITH PROPOFOL;  Surgeon: Lin Landsman, MD;  Location: [facility] ENDOSCOPY;  Service: Gastroenterology;  Laterality: N/A;  ? ICD IMPLANT N/A 05/14/2021  ? Procedure: ICD IMPLANT;  Surgeon: [name], MD;  Location: [place] CV LAB;  Service: Cardiovascular;  Laterality: N/A;  ? IR CT HEAD LTD  01/30/2021  ? IR PERCUTANEOUS ART THROMBECTOMY/INFUSION INTRACRANIAL INC DIAG ANGIO  01/30/2021  ? LEFT HEART CATH AND CORONARY ANGIOGRAPHY N/A 01/28/2021  ? Procedure: LEFT HEART CATH AND CORONARY ANGIOGRAPHY;  Surgeon: Nelva Bush, MD;  Location: [place] CV LAB;  Service: Cardiovascular;  Laterality: N/A;  ? RADIOLOGY WITH ANESTHESIA N/A 01/30/2021  ? Procedure: IR WITH ANESTHESIA;  Surgeon: Luanne Bras, MD;  Location: [place];  Service: Radiology;  Laterality: N/A;  ? TONSILLECTOMY AND ADENOIDECTOMY    ? TRANSCAROTID ARTERY REVASCULARIZATION?  Left 02/07/2021  ? Procedure: TRANSCAROTID ARTERY REVASCULARIZATION LEFT;  Surgeon: [name], MD;  Location: [facility] OR;  Service: Vascular;  Laterality: Left;  ? ? ?There were no vitals filed for this visit. ? ? Subjective Assessment - 02/11/22 2044   ? ? 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 Currently in Pain? No/denies   ? ?  ?  ? ?  ? ? ? ? ? ? ? ? ADULT SLP TREATMENT - 02/11/22 0001   ? ?  ? Treatment Provided  ? Treatment provided Cognitive-Linquistic   ?  ? Cognitive-Linquistic Treatment  ? 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 Skilled Treatment Skilled treatment session focused on pt's apraxia of speech to improve pt's speech intelligibility.  ? ?SLP facilitated session by having pt read list of single syllable "b words" with 100% speech intelligibility.  Prior to reading bi-syllable words, pt was instructed to write each word by syllable. Pt achieved 73% accuracy improved to 95% with minimal assistance. Pt's errors consisted of adding extra syllables. When reading 3 syllable words, pt wrote words by syllable with 50% accuracy. Pt's accuracy improved to 100% with maximal multimodal assistance including education on unstressed vowels and lines to separate words into syllables.   ? ?  ?  ? ?  ? ? ? SLP Education - 02/11/22 2052   ? ? Education Details apraxia drills, unstressed vowels   ? Person(s) Educated Patient   ? Methods Explanation;Demonstration;Verbal cues;Tactile cues;Handout   ? Comprehension Verbalized understanding;Returned demonstration;Need further instruction;Verbal cues required;Tactile cues required   ? ?  ?  ? ?  ? ? ? SLP Short Term Goals - 02/11/22 2054   ? ?  ? SLP SHORT TERM GOAL #1  ? Title Pt will produce sentences in response to a question (e.g. what?s your favorite holiday and  why) with appropriate articulation at 75% accuracy given frequent moderate phonemic placement cues.   ? Time 10   ? Period --   sessions  ? Status On-going   ?  ? SLP SHORT TERM GOAL #2  ? Title Pt will expressively produce 4+ word sentence in response to situation/picture with 50% accuracy given moderate cues in order to increase ability to communicate basic wants needs.   ? Time 10   ? Period --   sessions  ? Status On-going   ?  ? SLP SHORT TERM GOAL #3  ? Title Pt will write grammatically correct 5 word sentence about a basic picture with minimal assistance.   ? Time 10   ? Period --   sessions  ? Status On-going   ?  ? SLP SHORT TERM GOAL #4  ? Title Pt will initiate use of compensatory strategy to repair moments of communication breakdown 2 times in a session.   ? Time 10   ? Period --   sessions  ? Status On-going   ? ?  ?  ? ?  ? ? ? SLP Long Term Goals - 02/11/22 2057   ? ?  ? SLP LONG TERM GOAL #2  ? Title Patient will report carryover of communication  effectiveness strategies to reduce frustration when breakdowns occur.   ? Status On-going   ? Target Date 03/25/22   ? ?  ?  ? ?  ? ? ? Plan - 02/11/22 2053   ? ? Clinical Impression Statement Pt presents with mild anomic a

## 2022-02-12 LAB — CUP PACEART REMOTE DEVICE CHECK
Date Time Interrogation Session: 20230509093807
Implantable Lead Implant Date: 20220810
Implantable Lead Location: 753860
Implantable Lead Model: 436909
Implantable Lead Serial Number: 81459128
Implantable Pulse Generator Implant Date: 20220810
Pulse Gen Model: 429525
Pulse Gen Serial Number: 84855891

## 2022-02-16 ENCOUNTER — Ambulatory Visit: Payer: Medicare Other | Admitting: Speech Pathology

## 2022-02-16 ENCOUNTER — Telehealth: Payer: Self-pay

## 2022-02-16 DIAGNOSIS — R482 Apraxia: Secondary | ICD-10-CM | POA: Diagnosis not present

## 2022-02-16 DIAGNOSIS — I63412 Cerebral infarction due to embolism of left middle cerebral artery: Secondary | ICD-10-CM

## 2022-02-16 NOTE — Telephone Encounter (Signed)
-----   Message from [name], MD sent at 02/15/2022  8:51 PM EDT ----- ?Device interrogation reviewed. 5 VT episodes each treated with ATP x 1.  ? ?[facility], please reach out to Mr Heidinger and let him know he received more therapies. If clinically stable, OK to follow up as scheduled. ? ?Lysbeth Galas T. Quentin Ore, MD, [facility], [place] ?Cardiac Electrophysiology ? ? ?

## 2022-02-16 NOTE — Telephone Encounter (Signed)
Unsuccessful telephone encounter to patient to assess for s/s of VT episodes noted on scheduled remote 02/10/22. Already reviewed by Dr. Quentin Ore. If patient asymptomatic continue to follow up as scheduled. Hipaa compliant VM message left requesting call back to [phone]. ? ? ?"Scheduled remote reviewed. Normal device function.    ?11 NSVT  ?5 VT-1, each treated successfully with ATP x1, 3 events occurred 4/17.  1 additional event labeled SVT which was aborted.  ?Next remote 91 days." ? ? ?

## 2022-02-17 NOTE — Patient Instructions (Signed)
Read the provided word lists  ?

## 2022-02-17 NOTE — Therapy (Signed)
[place] [facility] ?[place], [place], [address] ?Phone: [phone]   Fax:  [phone] ? ?Speech Language Pathology Treatment ? ?Patient Details  ?Name: Garrett Hunter ?MRN: [id number] ?Date of Birth: May 02, 1946 ?Referring Provider (SLP): Derinda Late ? ? ?Encounter Date: 02/16/2022 ? ? End of Session - 02/17/22 1659   ? ? Visit Number 31   ? Number of Visits 44   ? Date for SLP Re-Evaluation 03/25/22   ? Authorization Type Medicare   ? Authorization Time Period 12/29/2021 thru 03/25/2022   ? Authorization - Visit Number 1   ? Progress Note Due on Visit 10   ? SLP Start Time 1300   ? SLP Stop Time  1345   ? SLP Time Calculation (min) 45 min   ? Activity Tolerance Patient tolerated treatment well   ? ?  ?  ? ?  ? ? ?Past Medical History:  ?Diagnosis Date  ? AAA (abdominal aortic aneurysm)   ? thoracoabdominal aortic aneursym, s/p right ilio-femoral bypass 10/12/16; 3V FEVAR with SMA, RRA, and LRA stenting 11/16/16 by Dr. Sammuel Hines [facility])  ? Benign neoplasm of colon   ? patient not sure  ? Chronic airway obstruction, not elsewhere classified   ? pt denies  ? 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 a. 01/2021 Echo: EF 45-50%, mild LVH, GrII DD, basal inf AK, nl RV fxn, mildly dil LA, triv MR. Ao root 70m.  ? Coronary artery disease   ? a. 1996 Inf MI: TPA; b. 2011 Cath: CTO pRCA/pLCX; c. 07/2012 NSTEMI/Cath (HI): plaque rupture in pRamius (stenting); d. 01/2021 Cath (in setting of VT): LM nl, LAD min irregs, RI patent stent, LCX 100p, OM2 fills via collats from D1, LPL1 - collats from dLAD, RCA 100p, RPDA - collats from Sept1/2.  ? Dilated aortic root ([place]   ? a. 01/2021 Echo: Ao root 318m  ? Gastric ulcer   ? a. 07/2018 s/p cauterization.  ? Hepatitis A   ? teenager, no longer a problem  ? Hyperlipidemia   ? Intolerance to statins.  ? Hypertension   ? Left carotid stenosis   ? a. 02/2021 s/p9 x 40 Enroute L carotid stent.  ?  [place] (myocardial infarction) ([place]  ? x 3 - last one 2013  ? Renal artery stenosis ([place]  ? a. 07/2018 s/p bilateral renal artery angioplasty.  ? [facility]  ? Ventricular tachycardia   ? a. 01/2021 s/p DCCV; b. 05/2021 s/p Biotronik Acticor 7 VR-T DX AICD (ser# [id number]  ? ? ?Past Surgical History:  ?Procedure Laterality Date  ? ABDOMINAL AORTIC ANEURYSM REPAIR    ? 11/16/2016  ? ANGIOPLASTY    ? CARDIAC CATHETERIZATION  2011  ? CARDIAC CATHETERIZATION  2/14  ? [facility]; 95% lesion in the ramus intermedius.   ? CATARACT EXTRACTION W/PHACO Left 12/19/2018  ? Procedure: CATARACT EXTRACTION PHACO AND INTRAOCULAR LENS PLACEMENT ([place] LEFT;  Surgeon: KiEulogio BearMD;  [place] Service: Ophthalmology;  Laterality: Left;  ? CATARACT EXTRACTION W/PHACO Right 03/06/2019  ? Procedure: CATARACT EXTRACTION PHACO AND INTRAOCULAR LENS PLACEMENT (IOC)RIGHT;  Surgeon: KiEulogio BearMD;  [place] Service: Ophthalmology;  Laterality: Right;  ? COLONOSCOPY WITH PROPOFOL N/A 03/20/2021  ? Procedure: COLONOSCOPY WITH PROPOFOL;  Surgeon: VaLin LandsmanMD;  [facility];  Service: Gastroenterology;  Laterality: N/A;  ? CORONARY ANGIOPLASTY  1996  ? [facility] x1  ? CORONARY ANGIOPLASTY  2/14  ? Severe restenosis in the ostial ramus. Status post angioplasty and drug-eluting stent placement with a 2.5 x 18 mm Xience drug-eluting stent  ? ESOPHAGOGASTRODUODENOSCOPY N/A 08/03/2018  ? Procedure: ESOPHAGOGASTRODUODENOSCOPY (EGD);  Surgeon: Lin Landsman, MD;  Location: [facility] ENDOSCOPY;  Service: Gastroenterology;  Laterality: N/A;  ? ESOPHAGOGASTRODUODENOSCOPY (EGD) WITH PROPOFOL N/A 11/03/2018  ? Procedure: ESOPHAGOGASTRODUODENOSCOPY (EGD) WITH PROPOFOL;  Surgeon: Lin Landsman, MD;  Location: [facility] ENDOSCOPY;  Service: Gastroenterology;  Laterality: N/A;  ? ESOPHAGOGASTRODUODENOSCOPY (EGD) WITH PROPOFOL N/A 12/06/2018  ? Procedure: ESOPHAGOGASTRODUODENOSCOPY  (EGD) WITH PROPOFOL;  Surgeon: Lucilla Lame, MD;  [facility]COPY;  Service: Endoscopy;  Laterality: N/A;  ? ESOPHAGOGASTRODUODENOSCOPY (EGD) WITH PROPOFOL N/A 03/19/2021  ? Procedure: ESOPHAGOGASTRODUODENOSCOPY (EGD) WITH PROPOFOL;  Surgeon: Lin Landsman, MD;  Location: [facility] ENDOSCOPY;  Service: Gastroenterology;  Laterality: N/A;  ? ICD IMPLANT N/A 05/14/2021  ? Procedure: ICD IMPLANT;  Surgeon: [name], MD;  Location: [place] CV LAB;  Service: Cardiovascular;  Laterality: N/A;  ? IR CT HEAD LTD  01/30/2021  ? IR PERCUTANEOUS ART THROMBECTOMY/INFUSION INTRACRANIAL INC DIAG ANGIO  01/30/2021  ? LEFT HEART CATH AND CORONARY ANGIOGRAPHY N/A 01/28/2021  ? Procedure: LEFT HEART CATH AND CORONARY ANGIOGRAPHY;  Surgeon: Nelva Bush, MD;  Location: [place] CV LAB;  Service: Cardiovascular;  Laterality: N/A;  ? RADIOLOGY WITH ANESTHESIA N/A 01/30/2021  ? Procedure: IR WITH ANESTHESIA;  Surgeon: Luanne Bras, MD;  Location: [place];  Service: Radiology;  Laterality: N/A;  ? TONSILLECTOMY AND ADENOIDECTOMY    ? TRANSCAROTID ARTERY REVASCULARIZATION?  Left 02/07/2021  ? Procedure: TRANSCAROTID ARTERY REVASCULARIZATION LEFT;  Surgeon: [name], MD;  Location: [facility] OR;  Service: Vascular;  Laterality: Left;  ? ? ?There were no vitals filed for this visit. ? ? Subjective Assessment - 02/17/22 1658   ? ? Subjective "there are good days and bad days" referring to speech intelligibility   ? Currently in Pain? No/denies   ? ?  ?  ? ?  ? ? ? ? ? ? ? ? ADULT SLP TREATMENT - 02/17/22 0001   ? ?  ? Treatment Provided  ? Treatment provided Cognitive-Linquistic   ?  ? Cognitive-Linquistic Treatment  ? 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 Skilled Treatment Skilled treatment session focused on pt's apraxia of speech.   ? ?SLP facilitated session by providing the following interventions:     ? ?Apraxia of Speech: Reading drill list of multi-syllabic words -  prior to reading list, SLP wrote end word by syllable and sound (not according to correct spelling) for example "worthy" was written "were  the" - pt with much improved motor skills using this strategy.   ? ?  ?  ? ?  ? ? ? SLP Education - 02/17/22 1659   ? ? Education Details apraxia of speech   ? Person(s) Educated Patient   ? Methods Explanation;Demonstration;Verbal cues;Handout   ? Comprehension Verbalized understanding;Returned demonstration;Verbal cues required;Need further instruction   ? ?  ?  ? ?  ? ? ? SLP Short Term Goals - 02/11/22 2054   ? ?  ? SLP SHORT TERM GOAL #1  ? Title Pt will produce sentences in response to a question (e.g. what?s your favorite holiday and why) with appropriate articulation at 75% accuracy given frequent moderate phonemic placement cues.   ? Time 10   ? Period --   sessions  ?  Status On-going   ?  ? SLP SHORT TERM GOAL #2  ? Title Pt will expressively produce 4+ word sentence in response to situation/picture with 50% accuracy given moderate cues in order to increase ability to communicate basic wants needs.   ? Time 10   ? Period --   sessions  ? Status On-going   ?  ? SLP SHORT TERM GOAL #3  ? Title Pt will write grammatically correct 5 word sentence about a basic picture with minimal assistance.   ? Time 10   ? Period --   sessions  ? Status On-going   ?  ? SLP SHORT TERM GOAL #4  ? Title Pt will initiate use of compensatory strategy to repair moments of communication breakdown 2 times in a session.   ? Time 10   ? Period --   sessions  ? Status On-going   ? ?  ?  ? ?  ? ? ? SLP Long Term Goals - 02/11/22 2057   ? ?  ? SLP LONG TERM GOAL #2  ? Title Patient will report carryover of communication effectiveness strategies to reduce frustration when breakdowns occur.   ? Status On-going   ? Target Date 03/25/22   ? ?  ?  ? ?  ? ? ? Plan - 02/17/22 1659   ? ? 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 In addition, pt voices that he is frequently frustrated by "the lack of control" that he has over his speech.  Skilled ST intervention continues to be required

## 2022-02-18 ENCOUNTER — Other Ambulatory Visit: Payer: Self-pay | Admitting: Cardiology

## 2022-02-18 ENCOUNTER — Ambulatory Visit: Payer: Medicare Other | Admitting: Speech Pathology

## 2022-02-18 DIAGNOSIS — R482 Apraxia: Secondary | ICD-10-CM | POA: Diagnosis not present

## 2022-02-18 DIAGNOSIS — I63412 Cerebral infarction due to embolism of left middle cerebral artery: Secondary | ICD-10-CM

## 2022-02-18 NOTE — Patient Instructions (Signed)
Practice the assigned drill work on [place] ?

## 2022-02-18 NOTE — Therapy (Signed)
[facility] ?[place], [place], [address] ?Phone: [phone]   Fax:  [phone] ? ?Speech Language Pathology Treatment ? ?Patient Details  ?Name: Garrett Hunter ?MRN: [id number] ?Date of Birth: [birth year] ?Referring Provider (SLP): Derinda Late ? ? ?Encounter Date: 02/18/2022 ? ? End of Session - 02/18/22 1635   ? ? Visit Number 32   ? Number of Visits 44   ? Date for SLP Re-Evaluation 03/25/22   ? Authorization Type Medicare   ? Authorization Time Period 12/29/2021 thru 03/25/2022   ? Authorization - Visit Number 2   ? Progress Note Due on Visit 10   ? SLP Start Time 1300   ? SLP Stop Time  1400   ? SLP Time Calculation (min) 60 min   ? Activity Tolerance Patient tolerated treatment well   ? ?  ?  ? ?  ? ? ?Past Medical History:  ?Diagnosis Date  ? AAA (abdominal aortic aneurysm)   ? thoracoabdominal aortic aneursym, s/p right ilio-femoral bypass 10/12/16; 3V FEVAR with SMA, RRA, and LRA stenting 11/16/16 by Dr. Sammuel Hines [facility])  ? Benign neoplasm of colon   ? patient not sure  ? Chronic airway obstruction, not elsewhere classified   ? pt denies  ? 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 a. 01/2021 Echo: EF 45-50%, mild LVH, GrII DD, basal inf AK, nl RV fxn, mildly dil LA, triv MR. Ao root 26m.  ? Coronary artery disease   ? a. 1996 Inf MI: TPA; b. 2011 Cath: CTO pRCA/pLCX; c. 07/2012 NSTEMI/Cath (HI): plaque rupture in pRamius (stenting); d. 01/2021 Cath (in setting of VT): LM nl, LAD min irregs, RI patent stent, LCX 100p, OM2 fills via collats from D1, LPL1 - collats from dLAD, RCA 100p, RPDA - collats from Sept1/2.  ? Dilated aortic root ([place]   ? a. 01/2021 Echo: Ao root 3104m  ? Gastric ulcer   ? a. 07/2018 s/p cauterization.  ? Hepatitis A   ? teenager, no longer a problem  ? Hyperlipidemia   ? Intolerance to statins.  ? Hypertension   ? Left carotid stenosis   ? a. 02/2021 s/p9 x 40 Enroute L carotid stent.  ?  [place] (myocardial infarction) ([place]  ? x 3 - last one 2013  ? Renal artery stenosis ([place]  ? a. 07/2018 s/p bilateral renal artery angioplasty.  ? [facility]  ? Ventricular tachycardia   ? a. 01/2021 s/p DCCV; b. 05/2021 s/p Biotronik Acticor 7 VR-T DX AICD (ser# [id number]  ? ? ?Past Surgical History:  ?Procedure Laterality Date  ? ABDOMINAL AORTIC ANEURYSM REPAIR    ? 11/16/2016  ? ANGIOPLASTY    ? CARDIAC CATHETERIZATION  2011  ? CARDIAC CATHETERIZATION  2/14  ? [facility]; 95% lesion in the ramus intermedius.   ? CATARACT EXTRACTION W/PHACO Left 12/19/2018  ? Procedure: CATARACT EXTRACTION PHACO AND INTRAOCULAR LENS PLACEMENT ([place] LEFT;  Surgeon: KiEulogio BearMD;  [place] Service: Ophthalmology;  Laterality: Left;  ? CATARACT EXTRACTION W/PHACO Right 03/06/2019  ? Procedure: CATARACT EXTRACTION PHACO AND INTRAOCULAR LENS PLACEMENT (IOC)RIGHT;  Surgeon: KiEulogio BearMD;  [place] Service: Ophthalmology;  Laterality: Right;  ? COLONOSCOPY WITH PROPOFOL N/A 03/20/2021  ? Procedure: COLONOSCOPY WITH PROPOFOL;  Surgeon: VaLin LandsmanMD;  [facility];  Service: Gastroenterology;  Laterality: N/A;  ? CORONARY ANGIOPLASTY  1996  ? [facility] x1  ? CORONARY ANGIOPLASTY  2/14  ? Severe restenosis in the ostial ramus. Status post angioplasty and drug-eluting stent placement with a 2.5 x 18 mm Xience drug-eluting stent  ? ESOPHAGOGASTRODUODENOSCOPY N/A 08/03/2018  ? Procedure: ESOPHAGOGASTRODUODENOSCOPY (EGD);  Surgeon: Lin Landsman, MD;  Location[facility] ENDOSCOPY;  Service: Gastroenterology;  Laterality: N/A;  ? ESOPHAGOGASTRODUODENOSCOPY (EGD) WITH PROPOFOL N/A 11/03/2018  ? Procedure: ESOPHAGOGASTRODUODENOSCOPY (EGD) WITH PROPOFOL;  Surgeon: Lin Landsman, MD;  Location: [facility] ENDOSCOPY;  Service: Gastroenterology;  Laterality: N/A;  ? ESOPHAGOGASTRODUODENOSCOPY (EGD) WITH PROPOFOL N/A 12/06/2018  ? Procedure: ESOPHAGOGASTRODUODENOSCOPY  (EGD) WITH PROPOFOL;  Surgeon: Lucilla Lame, MD;  Location: [facility] ENDOSCOPY;  Service: Endoscopy;  Laterality: N/A;  ? ESOPHAGOGASTRODUODENOSCOPY (EGD) WITH PROPOFOL N/A 03/19/2021  ? Procedure: ESOPHAGOGASTRODUODENOSCOPY (EGD) WITH PROPOFOL;  Surgeon: Lin Landsman, MD;  Location: [facility] ENDOSCOPY;  Service: Gastroenterology;  Laterality: N/A;  ? ICD IMPLANT N/A 05/14/2021  ? Procedure: ICD IMPLANT;  Surgeon: [name], MD;  Location: [place] CV LAB;  Service: Cardiovascular;  Laterality: N/A;  ? IR CT HEAD LTD  01/30/2021  ? IR PERCUTANEOUS ART THROMBECTOMY/INFUSION INTRACRANIAL INC DIAG ANGIO  01/30/2021  ? LEFT HEART CATH AND CORONARY ANGIOGRAPHY N/A 01/28/2021  ? Procedure: LEFT HEART CATH AND CORONARY ANGIOGRAPHY;  Surgeon: Nelva Bush, MD;  Location: [place] CV LAB;  Service: Cardiovascular;  Laterality: N/A;  ? RADIOLOGY WITH ANESTHESIA N/A 01/30/2021  ? Procedure: IR WITH ANESTHESIA;  Surgeon: Luanne Bras, MD;  Location: [place];  Service: Radiology;  Laterality: N/A;  ? TONSILLECTOMY AND ADENOIDECTOMY    ? TRANSCAROTID ARTERY REVASCULARIZATION?  Left 02/07/2021  ? Procedure: TRANSCAROTID ARTERY REVASCULARIZATION LEFT;  Surgeon: [name], MD;  [facility] OR;  Service: Vascular;  Laterality: Left;  ? ? ?There were no vitals filed for this visit. ? ? Subjective Assessment - 02/18/22 1633   ? ? Subjective more difficulty with fluid motor movement noted   ? Currently in Pain? No/denies   ? ?  ?  ? ?  ? ? ? ? ? ? ? ? ADULT SLP TREATMENT - 02/18/22 0001   ? ?  ? Treatment Provided  ? Treatment provided Cognitive-Linquistic   ?  ? Cognitive-Linquistic Treatment  ? Treatment focused on Apraxia;Patient/family/caregiver education   ? Skilled Treatment Skilled treatment session focused on pt's apraxia of speech.   ? ?SLP facilitated session by providing the following interventions:     ? ?Apraxia of Speech: TalkPath Therapy App Utilized  ? ?Articulation Videos:    ?/m/ and /t/ with  improvement noted in motor patterns over trials   ? ?Functional Repetition:    ?Word Repetition: 90%     ? ?Handout provided from [facility] on Acquire Apraxia of Speech as pt had questions about his speech intelligibility. Pt stated "it seems to be getting worse, not better." Pt found information in handout helpful especially the inconsistent nature of motor disorder. Additionally, education provided that while pt's speech intelligibility appears worse, it is likely because he is no longer struggling with word finding deficits. He currently is able to produce longer sentences and actually has the language abilities to converse whereas before, he wasn't able to converse d/t his word finding deficits.   ? ?  ?  ? ?  ? ? ? SLP Education - 02/18/22 2119   ? ? Education Details apraxia of speech   ? Person(s) Educated Patient   ? Methods Explanation;Demonstration;Verbal cues;Handout   ? Comprehension  Verbalized understanding;Returned demonstration;Need further instruction;Verbal cues required   ? ?  ?  ? ?  ? ? ? SLP Short Term Goals - 02/11/22 2054   ? ?  ? SLP SHORT TERM GOAL #1  ? Title Pt will produce sentences in response to a question (e.g. what?s your favorite holiday and why) with appropriate articulation at 75% accuracy given frequent moderate phonemic placement cues.   ? Time 10   ? Period --   sessions  ? Status On-going   ?  ? SLP SHORT TERM GOAL #2  ? Title Pt will expressively produce 4+ word sentence in response to situation/picture with 50% accuracy given moderate cues in order to increase ability to communicate basic wants needs.   ? Time 10   ? Period --   sessions  ? Status On-going   ?  ? SLP SHORT TERM GOAL #3  ? Title Pt will write grammatically correct 5 word sentence about a basic picture with minimal assistance.   ? Time 10   ? Period --   sessions  ? Status On-going   ?  ? SLP SHORT TERM GOAL #4  ? Title Pt will initiate use of compensatory strategy to repair moments of communication  breakdown 2 times in a session.   ? Time 10   ? Period --   sessions  ? Status On-going   ? ?  ?  ? ?  ? ? ? SLP Long Term Goals - 02/11/22 2057   ? ?  ? SLP LONG TERM GOAL #2  ? Title Patient will report carr

## 2022-02-23 ENCOUNTER — Ambulatory Visit: Payer: Medicare Other | Admitting: Speech Pathology

## 2022-02-23 NOTE — Progress Notes (Signed)
Remote ICD transmission.   

## 2022-02-25 ENCOUNTER — Ambulatory Visit: Payer: Medicare Other | Admitting: Speech Pathology

## 2022-03-03 NOTE — Progress Notes (Unsigned)
Electrophysiology Office Follow up Visit Note:    Date:  03/04/2022   ID:  Garrett Hunter, DOB Feb 20, 1946, MRN [id number]  PCP:  Derinda Late, MD  [facility] Heart[facility] Cardiologist:  Kathlyn Sacramento, MD  [facility] Electrophysiologist:  [name], MD    Interval History:    Garrett Hunter is a 76 y.o. male who presents for a follow up for his ICM and history of VT. Recent device interrogation showed 5 VT episodes treated with ATP. He continues amiodarone and mexiletine.   Today he tells me he is doing well.  He is working with his primary care physician to correct his thyroid dysregulation.  He tells me he continues to be active.  No problems with the ICD incision.     Past Medical History:  Diagnosis Date   AAA (abdominal aortic aneurysm) (HCC)    thoracoabdominal aortic aneursym, s/p right ilio-femoral bypass 10/12/16; 3V FEVAR with SMA, RRA, and LRA stenting 11/16/16 by Dr. 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 01/2021 Echo: EF 45-50%, mild LVH, GrII DD, basal inf AK, nl RV fxn, mildly dil LA, triv MR. Ao root 68m.   Coronary artery disease    a. 1996 Inf MI: TPA; b. 2011 Cath: CTO pRCA/pLCX; c. 07/2012 NSTEMI/Cath (HI): plaque rupture in pRamius (stenting); d. 01/2021 Cath (in setting of VT): LM nl, LAD min irregs, RI patent stent, LCX 100p, OM2 fills via collats from D1, LPL1 - collats from dLAD, RCA 100p, RPDA - collats from Sept1/2.   Dilated aortic root ([place]    a. 01/2021 Echo: Ao root 331m   Gastric ulcer    a. 07/2018 s/p cauterization.   Hepatitis A    teenager, no longer a problem   Hyperlipidemia    Intolerance to statins.   Hypertension    Left carotid stenosis    a. 02/2021 s/p9 x 40 Enroute L carotid stent.   MI (myocardial infarction) ([place]   x 3 - last one 2013   Renal artery  stenosis ([place]   a. 07/2018 s/p bilateral renal artery angioplasty.   [facility]   Ventricular tachycardia (HCAyr   a. 01/2021 s/p DCCV; b. 05/2021 s/p Biotronik Acticor 7 VR-T DX AICD (ser# [id number]    Past Surgical History:  Procedure Laterality Date   ABDOMINAL AORTIC ANEURYSM REPAIR     11/16/2016   ANGIOPLASTY     CARDIAC CATHETERIZATION  2011   CARDIAC CATHETERIZATION  2/14   [facility]; 95% lesion in the ramus intermedius.    CATARACT EXTRACTION W/PHACO Left 12/19/2018   Procedure: CATARACT EXTRACTION PHACO AND INTRAOCULAR LENS PLACEMENT (I[place] LEFT;  Surgeon: KiEulogio BearMD;  [place] Service: Ophthalmology;  Laterality: Left;   CATARACT EXTRACTION W/PHACO Right 03/06/2019   Procedure: CATARACT EXTRACTION PHACO AND INTRAOCULAR LENS PLACEMENT (IOC)RIGHT;  Surgeon: KiEulogio BearMD;  [place] Service: Ophthalmology;  Laterality: Right;   COLONOSCOPY WITH PROPOFOL N/A 03/20/2021   Procedure: COLONOSCOPY WITH PROPOFOL;  Surgeon: VaLin LandsmanMD;  [facility];  Service: Gastroenterology;  Laterality: N/A;   CORONARY ANGIOPLASTY  1996   [facility] x1   CORONARY ANGIOPLASTY  2/14   Severe restenosis in the ostial ramus. Status post angioplasty and  drug-eluting stent placement with a 2.5 x 18 mm Xience drug-eluting stent   ESOPHAGOGASTRODUODENOSCOPY N/A 08/03/2018   Procedure: ESOPHAGOGASTRODUODENOSCOPY (EGD);  Surgeon: Lin Landsman, MD;  Location: [facility] ENDOSCOPY;  Service: Gastroenterology;  Laterality: N/A;   ESOPHAGOGASTRODUODENOSCOPY (EGD) WITH PROPOFOL N/A 11/03/2018   Procedure: ESOPHAGOGASTRODUODENOSCOPY (EGD) WITH PROPOFOL;  Surgeon: Lin Landsman, MD;  Location: [facility] ENDOSCOPY;  Service: Gastroenterology;  Laterality: N/A;   ESOPHAGOGASTRODUODENOSCOPY (EGD) WITH PROPOFOL N/A 12/06/2018   Procedure: ESOPHAGOGASTRODUODENOSCOPY (EGD) WITH PROPOFOL;  Surgeon: Lucilla Lame, MD;  Location: [facility]  ENDOSCOPY;  Service: Endoscopy;  Laterality: N/A;   ESOPHAGOGASTRODUODENOSCOPY (EGD) WITH PROPOFOL N/A 03/19/2021   Procedure: ESOPHAGOGASTRODUODENOSCOPY (EGD) WITH PROPOFOL;  Surgeon: Lin Landsman, MD;  Location: [facility] ENDOSCOPY;  Service: Gastroenterology;  Laterality: N/A;   ICD IMPLANT N/A 05/14/2021   Procedure: ICD IMPLANT;  Surgeon: [name], MD;  Location: [place] CV LAB;  Service: Cardiovascular;  Laterality: N/A;   IR CT HEAD LTD  01/30/2021   IR PERCUTANEOUS ART THROMBECTOMY/INFUSION INTRACRANIAL INC DIAG ANGIO  01/30/2021   LEFT HEART CATH AND CORONARY ANGIOGRAPHY N/A 01/28/2021   Procedure: LEFT HEART CATH AND CORONARY ANGIOGRAPHY;  Surgeon: Nelva Bush, MD;  Location: [place] CV LAB;  Service: Cardiovascular;  Laterality: N/A;   RADIOLOGY WITH ANESTHESIA N/A 01/30/2021   Procedure: IR WITH ANESTHESIA;  Surgeon: Luanne Bras, MD;  Location: [place];  Service: Radiology;  Laterality: N/A;   TONSILLECTOMY AND ADENOIDECTOMY     TRANSCAROTID ARTERY REVASCULARIZATION  Left 02/07/2021   Procedure: TRANSCAROTID ARTERY REVASCULARIZATION LEFT;  Surgeon: [name], MD;  [facility] OR;  Service: Vascular;  Laterality: Left;    Current Medications: Current Meds  Medication Sig   amiodarone (PACERONE) 200 MG tablet Take 200 mg by mouth 2 (two) times daily.   carvedilol (COREG) 12.5 MG tablet TAKE 1 TABLET TWICE A DAY   clopidogrel (PLAVIX) 75 MG tablet Take 75 mg by mouth daily.   Coenzyme Q10 (COQ10) 100 MG CAPS Take 1 capsule by mouth every evening.   FARXIGA 10 MG TABS tablet Take 1 tablet (10 mg total) by mouth daily.   levothyroxine (SYNTHROID) 25 MCG tablet Take 25 mcg by mouth daily before breakfast.   mexiletine (MEXITIL) 150 MG capsule Take 1 capsule (150 mg total) by mouth 2 (two) times daily.   Multiple Vitamin (MULTIVITAMIN WITH MINERALS) TABS tablet Take 1 tablet by mouth daily.   pantoprazole (PROTONIX) 40 MG tablet Take 1 tablet (40 mg  total) by mouth daily.   rosuvastatin (CRESTOR) 5 MG tablet Take 1 tablet (5 mg total) by mouth daily.   sacubitril-valsartan (ENTRESTO) 24-26 MG Take 1 tablet by mouth 2 (two) times daily.   vitamin C (ASCORBIC ACID) 500 MG tablet Take 500 mg by mouth daily.     Allergies:   Crestor [rosuvastatin], Livalo [pitavastatin], Pravachol [pravastatin sodium], and Zocor [simvastatin]   Social History   Socioeconomic History   Marital status: Married    Spouse name: Not on file   Number of children: 4   Years of education: Not on file   Highest education level: Not on file  Occupational History   Not on file  Tobacco Use   Smoking status: Former    Packs/day: 2.00    Years: 40.00    Pack years: 80.00    Types: Cigarettes    Quit date: 2004    Years since quitting: 19.4   Smokeless tobacco: Former    Types: Secondary school teacher  Vaping Use: Never used  Substance and Sexual Activity   Alcohol use: Yes    Alcohol/week: 3.0 standard drinks    Types: 3 Cans of beer per week    Comment: occassionally   Drug use: No   Sexual activity: Yes  Other Topics Concern   Not on file  Social History Narrative   Lives at home with wife, works   Investment banker, operational of Radio broadcast assistant Strain: Not on file  Food Insecurity: Not on file  Transportation Needs: Not on file  Physical Activity: Not on file  Stress: Not on file  Social Connections: Not on file     Family History: The patient's family history includes Cancer (age of onset: 12) in his mother; Heart attack in his father; Hypertension in his brother and sister.  ROS:   Please see the history of present illness.    All other systems reviewed and are negative.  EKGs/Labs/Other Studies Reviewed:    The following studies were reviewed today:  Mar 04, 2022 in clinic device interrogation personally reviewed Battery status okay Lead parameters stable No VT episodes since April 17.  There were 5 VT 1 episodes each  successfully treated with 1 ATP scheme. No changes made today on device programming.  EKG:  The ekg ordered today demonstrates sinus rhythm.  2 PVCs.  Recent Labs: 03/20/2021: Hemoglobin 9.3; Platelets 223 11/19/2021: ALT 39; TSH 11.593 01/02/2022: BUN 19; Creatinine, Ser 1.30; Magnesium 2.1; Potassium 4.2; Sodium 146  Recent Lipid Panel    Component Value Date/Time   CHOL 119 01/30/2021 0530   CHOL 121 09/13/2012 0819   TRIG 85 01/30/2021 0530   HDL 38 (L) 01/30/2021 0530   HDL 37 (L) 09/13/2012 0819   CHOLHDL 3.1 01/30/2021 0530   VLDL 17 01/30/2021 0530   LDLCALC 64 01/30/2021 0530   LDLCALC 68 09/13/2012 0819    Physical Exam:    VS:  BP 120/74   Pulse (!) 55   Ht 6' (1.829 m)   Wt 193 lb 9.6 oz (87.8 kg)   SpO2 98%   BMI 26.26 kg/m     Wt Readings from Last 3 Encounters:  03/04/22 193 lb 9.6 oz (87.8 kg)  01/02/22 198 lb (89.8 kg)  12/11/21 202 lb (91.6 kg)     GEN:  Well nourished, well developed in no acute distress HEENT: Normal NECK: No JVD; No carotid bruits LYMPHATICS: No lymphadenopathy CARDIAC: RRR, no murmurs, rubs, gallops.  ICD pocket well-healed RESPIRATORY:  Clear to auscultation without rales, wheezing or rhonchi  ABDOMEN: Soft, non-tender, non-distended MUSCULOSKELETAL:  No edema; No deformity  SKIN: Warm and dry NEUROLOGIC:  Alert and oriented x 3.  Stable dysarthria. PSYCHIATRIC:  Normal affect        ASSESSMENT:    1. Chronic systolic heart failure ([place])   2. VT (ventricular tachycardia) ([place])   3. Ischemic cardiomyopathy   4. Cerebral thrombosis with cerebral infarction    PLAN:    In order of problems listed above:   #chronic systolic heart failure #ICM #VT Continues to have episodes of VT treated with ATP. Currently on amiodarone and mexiletine.  NYHA class I-II symptoms.  Continue medical therapy including Coreg, Lisabeth Register.  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  Continue Synthroid.  Appreciate assistance of  primary care physician.  I will plan to see him back in 4 months or sooner as needed.   Medication Adjustments/Labs and Tests Ordered: Current medicines are reviewed at length with the patient  today.  Concerns regarding medicines are outlined above.  No orders of the defined types were placed in this encounter.  No orders of the defined types were placed in this encounter.    Signed, Lars Mage, MD, [facility], [facility] 03/04/2022 9:31 AM    Electrophysiology [facility]

## 2022-03-04 ENCOUNTER — Encounter: Payer: Self-pay | Admitting: Cardiology

## 2022-03-04 ENCOUNTER — Ambulatory Visit: Payer: Medicare Other | Admitting: Speech Pathology

## 2022-03-04 ENCOUNTER — Ambulatory Visit (INDEPENDENT_AMBULATORY_CARE_PROVIDER_SITE_OTHER): Payer: Medicare Other | Admitting: Cardiology

## 2022-03-04 VITALS — BP 120/74 | HR 55 | Ht 72.0 in | Wt 193.6 lb

## 2022-03-04 DIAGNOSIS — I5022 Chronic systolic (congestive) heart failure: Secondary | ICD-10-CM

## 2022-03-04 DIAGNOSIS — I255 Ischemic cardiomyopathy: Secondary | ICD-10-CM | POA: Diagnosis not present

## 2022-03-04 DIAGNOSIS — I472 Ventricular tachycardia, unspecified: Secondary | ICD-10-CM | POA: Diagnosis not present

## 2022-03-04 DIAGNOSIS — R482 Apraxia: Secondary | ICD-10-CM

## 2022-03-04 DIAGNOSIS — I6330 Cerebral infarction due to thrombosis of unspecified cerebral artery: Secondary | ICD-10-CM

## 2022-03-04 DIAGNOSIS — I63412 Cerebral infarction due to embolism of left middle cerebral artery: Secondary | ICD-10-CM

## 2022-03-04 NOTE — Patient Instructions (Signed)
Continue practicing using articulation videos

## 2022-03-04 NOTE — Therapy (Addendum)
[place] MAIN [facility] SERVICES [address] [place], [place], [address] Phone: [phone]   Fax:  [phone]  Speech Language Pathology Treatment  Patient Details  Name: Garrett Hunter MRN: [id number] Date of Birth: [birth year] Referring Provider (SLP): Derinda Late   Encounter Date: 03/04/2022   End of Session - 03/04/22 2103     Visit Number 33    Number of Visits 110    Date for SLP Re-Evaluation 03/25/22    Authorization Type Medicare    Authorization Time Period 12/29/2021 thru 03/25/2022    Authorization - Visit Number 3    Progress Note Due on Visit 10    SLP Start Time 1    SLP Stop Time  1330    SLP Time Calculation (min) 30 min    Activity Tolerance Patient tolerated treatment well             Past Medical History:  Diagnosis Date   AAA (abdominal aortic aneurysm) ([place])    thoracoabdominal aortic aneursym, s/p right ilio-femoral bypass 10/12/16; 3V FEVAR with SMA, RRA, and LRA stenting 11/16/16 by Dr. 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 01/2021 Echo: EF 45-50%, mild LVH, GrII DD, basal inf AK, nl RV fxn, mildly dil LA, triv MR. Ao root 23m.   Coronary artery disease    a. 1996 Inf MI: TPA; b. 2011 Cath: CTO pRCA/pLCX; c. 07/2012 NSTEMI/Cath (HI): plaque rupture in pRamius (stenting); d. 01/2021 Cath (in setting of VT): LM nl, LAD min irregs, RI patent stent, LCX 100p, OM2 fills via collats from D1, LPL1 - collats from dLAD, RCA 100p, RPDA - collats from Sept1/2.   Dilated aortic root ([place]    a. 01/2021 Echo: Ao root 364m   Gastric ulcer    a. 07/2018 s/p cauterization.   Hepatitis A    teenager, no longer a problem   Hyperlipidemia    Intolerance to statins.   Hypertension    Left carotid stenosis    a. 02/2021 s/p9 x 40 Enroute L carotid  stent.   MI (myocardial infarction) ([place]   x 3 - last one 2013   Renal artery stenosis ([place]   a. 07/2018 s/p bilateral renal artery angioplasty.   [facility]   Ventricular tachycardia ([place]   a. 01/2021 s/p DCCV; b. 05/2021 s/p Biotronik Acticor 7 VR-T DX AICD (ser# [id number]    Past Surgical History:  Procedure Laterality Date   ABDOMINAL AORTIC ANEURYSM REPAIR     11/16/2016   ANGIOPLASTY     CARDIAC CATHETERIZATION  2011   CARDIAC CATHETERIZATION  2/14   [facility]; 95% lesion in the ramus intermedius.    CATARACT EXTRACTION W/PHACO Left 12/19/2018   Procedure: CATARACT EXTRACTION PHACO AND INTRAOCULAR LENS PLACEMENT ([place] LEFT;  Surgeon: KiEulogio BearMD;  [place] Service: Ophthalmology;  Laterality: Left;   CATARACT EXTRACTION W/PHACO Right 03/06/2019   Procedure: CATARACT EXTRACTION PHACO AND INTRAOCULAR LENS PLACEMENT (IOC)RIGHT;  Surgeon: KiEulogio BearMD;  [place] Service: Ophthalmology;  Laterality: Right;   COLONOSCOPY WITH PROPOFOL N/A 03/20/2021   Procedure: COLONOSCOPY WITH PROPOFOL;  Surgeon: VaLin LandsmanMD;  [facility];  Service: Gastroenterology;  Laterality: N/A;   CORONARY  ANGIOPLASTY  1996   [facility] x1   CORONARY ANGIOPLASTY  2/14   Severe restenosis in the ostial ramus. Status post angioplasty and drug-eluting stent placement with a 2.5 x 18 mm Xience drug-eluting stent   ESOPHAGOGASTRODUODENOSCOPY N/A 08/03/2018   Procedure: ESOPHAGOGASTRODUODENOSCOPY (EGD);  Surgeon: Lin Landsman, MD;  [facility] END[facility]COPY;  Service: Gastroenterology;  Laterality: N/A;   ESOPHAGOGASTRODUODENOSCOPY (EGD) WITH PROPOFOL N/A 11/03/2018   Procedure: ESOPHAGOGASTRODUODENOSCOPY (EGD) WITH PROPOFOL;  Surgeon: Lin Landsman, MD;  Location: [facility] ENDOSCOPY;  Service: Gastroenterology;  Laterality: N/A;   ESOPHAGOGASTRODUODENOSCOPY (EGD) WITH PROPOFOL N/A 12/06/2018   Procedure:  ESOPHAGOGASTRODUODENOSCOPY (EGD) WITH PROPOFOL;  Surgeon: Lucilla Lame, MD;  Location: [facility] ENDOSCOPY;  Service: Endoscopy;  Laterality: N/A;   ESOPHAGOGASTRODUODENOSCOPY (EGD) WITH PROPOFOL N/A 03/19/2021   Procedure: ESOPHAGOGASTRODUODENOSCOPY (EGD) WITH PROPOFOL;  Surgeon: Lin Landsman, MD;  Location: [facility] ENDOSCOPY;  Service: Gastroenterology;  Laterality: N/A;   ICD IMPLANT N/A 05/14/2021   Procedure: ICD IMPLANT;  Surgeon: [name], MD;  Location: [place] CV LAB;  Service: Cardiovascular;  Laterality: N/A;   IR CT HEAD LTD  01/30/2021   IR PERCUTANEOUS ART THROMBECTOMY/INFUSION INTRACRANIAL INC DIAG ANGIO  01/30/2021   LEFT HEART CATH AND CORONARY ANGIOGRAPHY N/A 01/28/2021   Procedure: LEFT HEART CATH AND CORONARY ANGIOGRAPHY;  Surgeon: Nelva Bush, MD;  Location: [place] CV LAB;  Service: Cardiovascular;  Laterality: N/A;   RADIOLOGY WITH ANESTHESIA N/A 01/30/2021   Procedure: IR WITH ANESTHESIA;  Surgeon: Luanne Bras, MD;  Location: [place];  Service: Radiology;  Laterality: N/A;   TONSILLECTOMY AND ADENOIDECTOMY     TRANSCAROTID ARTERY REVASCULARIZATION  Left 02/07/2021   Procedure: TRANSCAROTID ARTERY REVASCULARIZATION LEFT;  Surgeon: [name], MD;  Location: [place];  Service: Vascular;  Laterality: Left;    There were no vitals filed for this visit.   Subjective Assessment - 03/04/22 2102     Subjective Pt states that he is feeling better, but struggled with articulation videos    Currently in Pain? 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  SLP facilitated session by providing the following interventions:    Apraxia of Speech: TalkPath Therapy App Utilized   Articulation Videos:   /b/ and /p/ with improvement noted in motor patterns over trials  Functional Repetition:   Word Repetition: 90%                 SLP Education - 03/04/22 2103     Education Details creating a routine of practicing    Person(s) Educated Patient    Methods Explanation;Demonstration;Verbal cues    Comprehension Verbalized understanding;Returned demonstration;Need further instruction;Verbal cues required              SLP Short Term Goals - 02/11/22 2054       SLP SHORT TERM GOAL #1   Title Pt will produce sentences in response to a question (e.g. what's your favorite holiday and why) with appropriate articulation at 75% accuracy given frequent moderate phonemic placement cues.    Time 10    Period --   sessions   Status On-going      SLP SHORT TERM  GOAL #2   Title Pt will expressively produce 4+ word sentence in response to situation/picture with 50% accuracy given moderate cues in order to increase ability to communicate basic wants needs.    Time 10    Period --   sessions   Status On-going      SLP SHORT TERM GOAL #3   Title Pt will write grammatically correct 5 word sentence about a basic picture with minimal assistance.    Time 10    Period --   sessions   Status On-going      SLP SHORT TERM GOAL #4   Title Pt will initiate use of compensatory strategy to repair moments of communication breakdown 2 times in a session.    Time 10    Period --   sessions   Status On-going              SLP Long Term Goals - 02/11/22 2057       SLP LONG TERM GOAL #2   Title Patient will report carryover of communication effectiveness strategies to reduce frustration when breakdowns occur.    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 SLP instructed in using easy onset but pt unable to utilize  easily. He experiences more success with motor movements in response to articulation videos found within [place] app. Pt also instructed in need to perform drill work in multiple reps as well as increasing practice to multiple times per day. Skilled ST intervention continues to be required to target these deficits in an effort to increase pt's functional independence for return to leisure, family and community activities as well as increase pt's quality of life.    Speech Therapy Frequency 2x / week    Duration 12 weeks    Treatment/Interventions SLP instruction and feedback;Patient/family education;Language facilitation             Patient will benefit from skilled therapeutic intervention in order to improve the following deficits and impairments:   Apraxia  Cerebral infarction due to embolism of left middle cerebral artery [facility])    Problem List Patient Active Problem List   Diagnosis Date Noted   Aortic aneurysm ([place]) 05/29/2021   ICD (implantable cardioverter-defibrillator) in place 05/14/2021   Ischemic cardiomyopathy 05/14/2021   NSVT (nonsustained ventricular tachycardia) ([place])    Melena 03/17/2021   Upper GI bleeding 03/16/2021   CKD (chronic kidney disease) stage 3, GFR 30-59 ml/min ([place]) 03/16/2021   Hypokalemia 03/16/2021   Carotid stenosis 02/07/2021   Cerebral thrombosis with cerebral infarction 01/30/2021   Cerebral embolism with cerebral infarction 01/30/2021   AKI (acute kidney injury) ([place])    Sustained VT (ventricular tachycardia) ([place]) 01/27/2021   Peptic ulcer disease    Duodenal ulcer disease    GIB (gastrointestinal bleeding) 08/02/2018   Diabetes mellitus without complication ([place]) 32/35/5732   Erectile dysfunction 07/01/2015   Primary hypertension 08/09/2014   Pure hypercholesterolemia 20/25/4270   Chronic systolic heart failure ([place]) 08/04/2012   Coronary artery disease    Hyperlipidemia    AAA (abdominal aortic aneurysm) (HCC)    NSTEMI  (non-ST elevated myocardial infarction) ([place]) 07/09/2012   Hyperglycemia 07/08/2012   Madsen Riddle B. Rutherford Nail, M.S., CCC-SLP, [place] Pathologist Certified Brain Injury [facility]  Rehabilitation Services Office [phone] Ascom [phone] Fax [phone]  Clarisa Fling 03/04/2022, 9:08 PM  [place] MAIN [facility] SERVICES [address] [place], [place], [address] Phone: [phone]   Fax:  [phone]   Name: Garrett Hunter MRN: [id number] Date of Birth: October 15, 1945

## 2022-03-04 NOTE — Patient Instructions (Signed)
Medications: Your physician recommends that you continue on your current medications as directed. Please refer to the Current Medication list given to you today. *If you need a refill on your cardiac medications before your next appointment, please call your pharmacy*  Lab Work: None. If you have labs (blood work) drawn today and your tests are completely normal, you will receive your results only by: [place] (if you have MyChart) OR A paper copy in the mail If you have any lab test that is abnormal or we need to change your treatment, we will call you to review the results.  Testing/Procedures: None.  Follow-Up: At [facility], you and your health needs are our priority.  As part of our continuing mission to provide you with exceptional heart care, we have created designated Provider Care Teams.  These Care Teams include your primary Cardiologist (physician) and Advanced Practice Providers (APPs -  Physician Assistants and Nurse Practitioners) who all work together to provide you with the care you need, when you need it.  Your physician wants you to follow-up in: 4 months with Lars Mage, MD   We recommend signing up for the patient portal called "MyChart".  Sign up information is provided on this After Visit Summary.  MyChart is used to connect with patients for Virtual Visits (Telemedicine).  Patients are able to view lab/test results, encounter notes, upcoming appointments, etc.  Non-urgent messages can be sent to your provider as well.   To learn more about what you can do with MyChart, go to [email or web address].    Any Other Special Instructions Will Be Listed Below (If Applicable).

## 2022-03-05 LAB — CUP PACEART INCLINIC DEVICE CHECK
Date Time Interrogation Session: 20230531150912
Implantable Lead Implant Date: 20220810
Implantable Lead Location: 753860
Implantable Lead Model: 436909
Implantable Lead Serial Number: 81459128
Implantable Pulse Generator Implant Date: 20220810
Pulse Gen Model: 429525
Pulse Gen Serial Number: 84855891

## 2022-03-09 ENCOUNTER — Ambulatory Visit: Payer: Medicare Other | Attending: Family Medicine | Admitting: Speech Pathology

## 2022-03-09 DIAGNOSIS — R4701 Aphasia: Secondary | ICD-10-CM | POA: Diagnosis present

## 2022-03-09 DIAGNOSIS — I63412 Cerebral infarction due to embolism of left middle cerebral artery: Secondary | ICD-10-CM | POA: Diagnosis present

## 2022-03-09 DIAGNOSIS — R482 Apraxia: Secondary | ICD-10-CM | POA: Diagnosis present

## 2022-03-11 ENCOUNTER — Ambulatory Visit: Payer: Medicare Other | Admitting: Speech Pathology

## 2022-03-11 NOTE — Therapy (Signed)
[place] MAIN [facility] SERVICES [address] [place], [place], [address] Phone: [phone]   Fax:  [phone]  Speech Language Pathology Treatment DISCHARGE SUMMARY  Patient Details  Name: Garrett Hunter MRN: [id number] Date of Birth: [birth year] Referring Provider (SLP): Derinda Late   Encounter Date: 03/09/2022   End of Session - 03/11/22 1318     Visit Number 34    Number of Visits 42    Date for SLP Re-Evaluation 03/25/22    Authorization Type Medicare    Authorization Time Period 12/29/2021 thru 03/25/2022    Authorization - Visit Number 4    Progress Note Due on Visit 10    SLP Start Time 15    SLP Stop Time  42    SLP Time Calculation (min) 45 min    Activity Tolerance Patient tolerated treatment well             Past Medical History:  Diagnosis Date   AAA (abdominal aortic aneurysm) ([place])    thoracoabdominal aortic aneursym, s/p right ilio-femoral bypass 10/12/16; 3V FEVAR with SMA, RRA, and LRA stenting 11/16/16 by Dr. Sammuel Hines 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 01/2021 Echo: EF 45-50%, mild LVH, GrII DD, basal inf AK, nl RV fxn, mildly dil LA, triv MR. Ao root 100m.   Coronary artery disease    a. 1996 Inf MI: TPA; b. 2011 Cath: CTO pRCA/pLCX; c. 07/2012 NSTEMI/Cath ([place]): plaque rupture in pRamius (stenting); d. 01/2021 Cath (in setting of VT): LM nl, LAD min irregs, RI patent stent, LCX 100p, OM2 fills via collats from D1, LPL1 - collats from dLAD, RCA 100p, RPDA - collats from Sept1/2.   Dilated aortic root ([place]    a. 01/2021 Echo: Ao root 367m   Gastric ulcer    a. 07/2018 s/p cauterization.   Hepatitis A    teenager, no longer a problem   Hyperlipidemia    Intolerance to statins.   Hypertension    Left carotid stenosis    a. 02/2021 s/p9 x 40  Enroute L carotid stent.   MI (myocardial infarction) ([place]   x 3 - last one 2013   Renal artery stenosis (HCLinn   a. 07/2018 s/p bilateral renal artery angioplasty.   [facility]   Ventricular tachycardia ([place]CK[place]   a. 01/2021 s/p DCCV; b. 05/2021 s/p Biotronik Acticor 7 VR-T DX AICD (ser# [id number]    Past Surgical History:  Procedure Laterality Date   ABDOMINAL AORTIC ANEURYSM REPAIR     11/16/2016   ANGIOPLASTY     CARDIAC CATHETERIZATION  2011   CARDIAC CATHETERIZATION  2/14   [facility]; 95% lesion in the ramus intermedius.    CATARACT EXTRACTION W/PHACO Left 12/19/2018   Procedure: CATARACT EXTRACTION PHACO AND INTRAOCULAR LENS PLACEMENT ([place] LEFT;  Surgeon: KiEulogio BearMD;  [place] Service: Ophthalmology;  Laterality: Left;   CATARACT EXTRACTION W/PHACO Right 03/06/2019   Procedure: CATARACT EXTRACTION PHACO AND INTRAOCULAR LENS PLACEMENT (IOC)RIGHT;  Surgeon: KiEulogio BearMD;  [place] Service: Ophthalmology;  Laterality: Right;   COLONOSCOPY WITH PROPOFOL N/A 03/20/2021   Procedure: COLONOSCOPY WITH PROPOFOL;  Surgeon: VaLin LandsmanMD;  [facility];  Service: Gastroenterology;  Laterality: N/A;  CORONARY ANGIOPLASTY  1996   [facility] x1   CORONARY ANGIOPLASTY  2/14   Severe restenosis in the ostial ramus. Status post angioplasty and drug-eluting stent placement with a 2.5 x 18 mm Xience drug-eluting stent   ESOPHAGOGASTRODUODENOSCOPY N/A 08/03/2018   Procedure: ESOPHAGOGASTRODUODENOSCOPY (EGD);  Surgeon: Lin Landsman, MD;  Location: [facility] ENDOSCOPY;  Service: Gastroenterology;  Laterality: N/A;   ESOPHAGOGASTRODUODENOSCOPY (EGD) WITH PROPOFOL N/A 11/03/2018   Procedure: ESOPHAGOGASTRODUODENOSCOPY (EGD) WITH PROPOFOL;  Surgeon: Lin Landsman, MD;  Location: [facility] ENDOSCOPY;  Service: Gastroenterology;  Laterality: N/A;   ESOPHAGOGASTRODUODENOSCOPY (EGD) WITH PROPOFOL N/A 12/06/2018    Procedure: ESOPHAGOGASTRODUODENOSCOPY (EGD) WITH PROPOFOL;  Surgeon: Lucilla Lame, MD;  Location: [facility] ENDOSCOPY;  Service: Endoscopy;  Laterality: N/A;   ESOPHAGOGASTRODUODENOSCOPY (EGD) WITH PROPOFOL N/A 03/19/2021   Procedure: ESOPHAGOGASTRODUODENOSCOPY (EGD) WITH PROPOFOL;  Surgeon: Lin Landsman, MD;  Location: [facility] ENDOSCOPY;  Service: Gastroenterology;  Laterality: N/A;   ICD IMPLANT N/A 05/14/2021   Procedure: ICD IMPLANT;  Surgeon: [name], MD;  Location: [place] CV LAB;  Service: Cardiovascular;  Laterality: N/A;   IR CT HEAD LTD  01/30/2021   IR PERCUTANEOUS ART THROMBECTOMY/INFUSION INTRACRANIAL INC DIAG ANGIO  01/30/2021   LEFT HEART CATH AND CORONARY ANGIOGRAPHY N/A 01/28/2021   Procedure: LEFT HEART CATH AND CORONARY ANGIOGRAPHY;  Surgeon: Nelva Bush, MD;  Location: [place] CV LAB;  Service: Cardiovascular;  Laterality: N/A;   RADIOLOGY WITH ANESTHESIA N/A 01/30/2021   Procedure: IR WITH ANESTHESIA;  Surgeon: Luanne Bras, MD;  Location: [place];  Service: Radiology;  Laterality: N/A;   TONSILLECTOMY AND ADENOIDECTOMY     TRANSCAROTID ARTERY REVASCULARIZATION  Left 02/07/2021   Procedure: TRANSCAROTID ARTERY REVASCULARIZATION LEFT;  Surgeon: [name], MD;  Location: [place];  Service: Vascular;  Laterality: Left;    There were no vitals filed for this visit.   Subjective Assessment - 03/11/22 1314     Subjective "I need to practice more, I didn't write anything down, but took a deep breath and started again"    Currently in Pain? No/denies                   ADULT SLP TREATMENT - 03/11/22 0001       Treatment Provided   Treatment provided Cognitive-Linquistic      Cognitive-Linquistic Treatment   Treatment focused on Apraxia;Patient/family/caregiver education    Skilled Treatment Skilled treatment session focused on reviewing strategies to improve communication during moments of decreased speech intelligibility. Pt appears  to be using multiple strategies interchangablly.              SLP Education - 03/11/22 1318     Education Details routine practicing is key    Person(s) Educated Patient    Methods Explanation;Demonstration;Verbal cues    Comprehension Verbalized understanding              SLP Short Term Goals - 03/11/22 1331       SLP SHORT TERM GOAL #1   Title Pt will produce sentences in response to a question (e.g. what's your favorite holiday and why) with appropriate articulation at 75% accuracy given frequent moderate phonemic placement cues.    Status Achieved      SLP SHORT TERM GOAL #2   Title Pt will expressively produce 4+ word sentence in response to situation/picture with 50% accuracy given moderate cues in order to increase ability to communicate basic wants needs.    Status Achieved  SLP SHORT TERM GOAL #3   Title Pt will write grammatically correct 5 word sentence about a basic picture with minimal assistance.    Status Achieved      SLP SHORT TERM GOAL #4   Title Pt will initiate use of compensatory strategy to repair moments of communication breakdown 2 times in a session.    Status Achieved              SLP Long Term Goals - 03/11/22 1332       SLP LONG TERM GOAL #1   Title Pt will use functional communication skills for social interaction (e.g., greetings,  social etiquette, and short questions/simple sentences) with both familiar and unfamiliar  partners with 90% success.    Status Achieved      SLP LONG TERM GOAL #2   Title Patient will report carryover of communication effectiveness strategies to reduce frustration when breakdowns occur.    Status Achieved              Plan - 03/11/22 1318     Clinical Impression Statement Pt has made great gains over the course of skilled ST intervention. As such, he currently speaks in 5-7 word sentences with little evidence of word finding difficulty. While pt continues with moderate apraxia of speech  that reduces his speech intelligibility to ~ 50% at the simple conversation level. Pt demonstrates understanding of compensatory speech itnelligibility strategies. As such, pt is appropriate for discharge from skilled ST services. All education has been completed.    Consulted and Agree with Plan of Care Patient               Problem List Patient Active Problem List   Diagnosis Date Noted   Aortic aneurysm ([place]) 05/29/2021   ICD (implantable cardioverter-defibrillator) in place 05/14/2021   Ischemic cardiomyopathy 05/14/2021   NSVT (nonsustained ventricular tachycardia) ([place])    Melena 03/17/2021   Upper GI bleeding 03/16/2021   CKD (chronic kidney disease) stage 3, GFR 30-59 ml/min ([place]) 03/16/2021   Hypokalemia 03/16/2021   Carotid stenosis 02/07/2021   Cerebral thrombosis with cerebral infarction 01/30/2021   Cerebral embolism with cerebral infarction 01/30/2021   AKI (acute kidney injury) ([place])    Sustained VT (ventricular tachycardia) ([place]) 01/27/2021   Peptic ulcer disease    Duodenal ulcer disease    GIB (gastrointestinal bleeding) 08/02/2018   Diabetes mellitus without complication ([place]) 76/22/6333   Erectile dysfunction 07/01/2015   Primary hypertension 08/09/2014   Pure hypercholesterolemia 54/56/2563   Chronic systolic heart failure ([place]) 08/04/2012   Coronary artery disease    Hyperlipidemia    AAA (abdominal aortic aneurysm) (H[facility])    NSTEMI (non-ST elevated myocardial infarction) ([place]) 07/09/2012   Hyperglycemia 07/08/2012   Zacharius Funari B. Rutherford Nail M.S., CCC-SLP, [place] Pathologist Certified Brain Injury [place]  [facility] [phone] Ascom [phone] Fax [phone]  Stormy Fabian, [place] 03/11/2022, 1:32 PM  [place] [address] [place], [place], [address] Phone: [phone]   Fax:  [phone]   Name:  TERREL NESHEIWAT MRN: [id number] Date of Birth: 31-Jul-1946

## 2022-03-16 ENCOUNTER — Ambulatory Visit: Payer: Medicare Other | Admitting: Speech Pathology

## 2022-03-18 ENCOUNTER — Ambulatory Visit: Payer: Medicare Other | Admitting: Speech Pathology

## 2022-03-23 ENCOUNTER — Ambulatory Visit: Payer: Medicare Other | Admitting: Speech Pathology

## 2022-03-25 ENCOUNTER — Ambulatory Visit: Payer: Medicare Other | Admitting: Speech Pathology

## 2022-03-30 ENCOUNTER — Ambulatory Visit: Payer: Medicare Other | Admitting: Speech Pathology

## 2022-04-01 ENCOUNTER — Ambulatory Visit: Payer: Medicare Other | Admitting: Speech Pathology

## 2022-04-02 ENCOUNTER — Ambulatory Visit: Payer: Medicare Other | Admitting: Neurology

## 2022-04-06 ENCOUNTER — Ambulatory Visit: Payer: Medicare Other | Admitting: Speech Pathology

## 2022-04-08 ENCOUNTER — Encounter: Payer: Medicare Other | Admitting: Speech Pathology

## 2022-04-13 ENCOUNTER — Encounter: Payer: Medicare Other | Admitting: Speech Pathology

## 2022-04-15 ENCOUNTER — Encounter: Payer: Medicare Other | Admitting: Speech Pathology

## 2022-04-20 ENCOUNTER — Encounter: Payer: Medicare Other | Admitting: Speech Pathology

## 2022-04-22 ENCOUNTER — Encounter: Payer: Medicare Other | Admitting: Speech Pathology

## 2022-04-27 ENCOUNTER — Encounter: Payer: Medicare Other | Admitting: Speech Pathology

## 2022-04-29 ENCOUNTER — Encounter: Payer: Medicare Other | Admitting: Speech Pathology

## 2022-05-04 ENCOUNTER — Encounter: Payer: Medicare Other | Admitting: Speech Pathology

## 2022-05-06 ENCOUNTER — Encounter: Payer: Medicare Other | Admitting: Speech Pathology

## 2022-05-11 ENCOUNTER — Encounter: Payer: Medicare Other | Admitting: Speech Pathology

## 2022-05-12 ENCOUNTER — Other Ambulatory Visit: Payer: Self-pay | Admitting: Cardiology

## 2022-05-13 ENCOUNTER — Ambulatory Visit (INDEPENDENT_AMBULATORY_CARE_PROVIDER_SITE_OTHER): Payer: Medicare Other

## 2022-05-13 ENCOUNTER — Encounter: Payer: Medicare Other | Admitting: Speech Pathology

## 2022-05-13 DIAGNOSIS — I255 Ischemic cardiomyopathy: Secondary | ICD-10-CM | POA: Diagnosis not present

## 2022-05-13 LAB — CUP PACEART REMOTE DEVICE CHECK
Date Time Interrogation Session: 20230809073924
Implantable Lead Implant Date: 20220810
Implantable Lead Location: 753860
Implantable Lead Model: 436909
Implantable Lead Serial Number: 81459128
Implantable Pulse Generator Implant Date: 20220810
Pulse Gen Model: 429525
Pulse Gen Serial Number: 84855891

## 2022-05-18 ENCOUNTER — Encounter: Payer: Medicare Other | Admitting: Speech Pathology

## 2022-05-20 ENCOUNTER — Encounter: Payer: Medicare Other | Admitting: Cardiology

## 2022-05-20 ENCOUNTER — Encounter: Payer: Medicare Other | Admitting: Speech Pathology

## 2022-05-25 ENCOUNTER — Encounter: Payer: Medicare Other | Admitting: Speech Pathology

## 2022-05-27 ENCOUNTER — Encounter: Payer: Medicare Other | Admitting: Speech Pathology

## 2022-06-01 ENCOUNTER — Encounter: Payer: Medicare Other | Admitting: Speech Pathology

## 2022-06-03 ENCOUNTER — Encounter: Payer: Medicare Other | Admitting: Speech Pathology

## 2022-06-08 ENCOUNTER — Other Ambulatory Visit: Payer: Self-pay | Admitting: Cardiology

## 2022-06-10 ENCOUNTER — Encounter: Payer: Medicare Other | Admitting: Speech Pathology

## 2022-06-15 ENCOUNTER — Encounter: Payer: Medicare Other | Admitting: Speech Pathology

## 2022-06-16 NOTE — Progress Notes (Signed)
Remote ICD transmission.   

## 2022-06-17 ENCOUNTER — Encounter: Payer: Medicare Other | Admitting: Speech Pathology

## 2022-06-22 ENCOUNTER — Encounter: Payer: Medicare Other | Admitting: Speech Pathology

## 2022-06-24 ENCOUNTER — Encounter: Payer: Medicare Other | Admitting: Speech Pathology

## 2022-06-29 ENCOUNTER — Encounter: Payer: Medicare Other | Admitting: Speech Pathology

## 2022-07-01 ENCOUNTER — Encounter: Payer: Medicare Other | Admitting: Speech Pathology

## 2022-07-06 ENCOUNTER — Encounter: Payer: Medicare Other | Admitting: Speech Pathology

## 2022-07-08 ENCOUNTER — Encounter: Payer: Medicare Other | Admitting: Speech Pathology

## 2022-07-13 ENCOUNTER — Encounter: Payer: Medicare Other | Admitting: Speech Pathology

## 2022-07-14 NOTE — Progress Notes (Unsigned)
Electrophysiology Office Follow up Visit Note:    Date:  07/15/2022   ID:  Garrett Hunter, DOB 21-Jul-1946, MRN [id number]  PCP:  Derinda Late, MD  [facility] Cardiologist:  Kathlyn Sacramento, MD  [facility] Electrophysiologist:  [name], MD    Interval History:    Garrett Hunter is a 76 y.o. male who presents for a follow up visit. They were last seen in clinic 03/04/2022.   At our last appointment, he was doing well. He had received ATP for VT episodes.   Today he tells me he is doing well.  No syncopal episodes.  Tolerating the medications without any significant off target effects.       Past Medical History:  Diagnosis Date   AAA (abdominal aortic aneurysm) (HCC)    thoracoabdominal aortic aneursym, s/p right ilio-femoral bypass 10/12/16; 3V FEVAR with SMA, RRA, and LRA stenting 11/16/16 by Dr. 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 01/2021 Echo: EF 45-50%, mild LVH, GrII DD, basal inf AK, nl RV fxn, mildly dil LA, triv MR. Ao root 56m.   Coronary artery disease    a. 1996 Inf MI: TPA; b. 2011 Cath: CTO pRCA/pLCX; c. 07/2012 NSTEMI/Cath (HI): plaque rupture in pRamius (stenting); d. 01/2021 Cath (in setting of VT): LM nl, LAD min irregs, RI patent stent, LCX 100p, OM2 fills via collats from D1, LPL1 - collats from dLAD, RCA 100p, RPDA - collats from Sept1/2.   Dilated aortic root ([place]    a. 01/2021 Echo: Ao root 353m   Gastric ulcer    a. 07/2018 s/p cauterization.   Hepatitis A    teenager, no longer a problem   Hyperlipidemia    Intolerance to statins.   Hypertension    Left carotid stenosis    a. 02/2021 s/p9 x 40 Enroute L carotid stent.   [place]ocardial infarction) ([place]   x 3 - last one 2013   Renal artery stenosis ([place]   a. 07/2018 s/p bilateral renal artery angioplasty.    [facility]   Ventricular tachycardia ([place]   a. 01/2021 s/p DCCV; b. 05/2021 s/p Biotronik Acticor 7 VR-T DX AICD (ser# [id number]    Past Surgical History:  Procedure Laterality Date   ABDOMINAL AORTIC ANEURYSM REPAIR     11/16/2016   ANGIOPLASTY     CARDIAC CATHETERIZATION  2011   CARDIAC CATHETERIZATION  2/14   [facility]; 95% lesion in the ramus intermedius.    CATARACT EXTRACTION W/PHACO Left 12/19/2018   Procedure: CATARACT EXTRACTION PHACO AND INTRAOCULAR LENS PLACEMENT ([place] LEFT;  Surgeon: KiEulogio BearMD;  [place]: Ophthalmology;  Laterality: Left;   CATARACT EXTRACTION W/PHACO Right 03/06/2019   Procedure: CATARACT EXTRACTION PHACO AND INTRAOCULAR LENS PLACEMENT (IOC)RIGHT;  Surgeon: KiEulogio BearMD;  [place] Service: Ophthalmology;  Laterality: Right;   COLONOSCOPY WITH PROPOFOL N/A 03/20/2021   Procedure: COLONOSCOPY WITH PROPOFOL;  Surgeon: VaLin LandsmanMD;  [facility];  Service: Gastroenterology;  Laterality: N/A;   CORONARY ANGIOPLASTY  1996   [facility] x1   CORONARY ANGIOPLASTY  2/14   Severe restenosis in the ostial ramus. Status post angioplasty and drug-eluting stent placement with a 2.5 x 18 mm Xience drug-eluting  stent   ESOPHAGOGASTRODUODENOSCOPY N/A 08/03/2018   Procedure: ESOPHAGOGASTRODUODENOSCOPY (EGD);  Surgeon: Lin Landsman, MD;  Location[facility] END[facility]COPY;  Service: Gastroenterology;  Laterality: N/A;   ESOPHAGOGASTRODUODENOSCOPY (EGD) WITH PROPOFOL N/A 11/03/2018   Procedure: ESOPHAGOGASTRODUODENOSCOPY (EGD) WITH PROPOFOL;  Surgeon: Lin Landsman, MD;  Location[facility] END[facility]COPY;  Service: Gastroenterology;  Laterality: N/A;   ESOPHAGOGASTRODUODENOSCOPY (EGD) WITH PROPOFOL N/A 12/06/2018   Procedure: ESOPHAGOGASTRODUODENOSCOPY (EGD) WITH PROPOFOL;  Surgeon: Lucilla Lame, MD;  Location: [facility] ENDOSCOPY;  Service: Endoscopy;  Laterality: N/A;    ESOPHAGOGASTRODUODENOSCOPY (EGD) WITH PROPOFOL N/A 03/19/2021   Procedure: ESOPHAGOGASTRODUODENOSCOPY (EGD) WITH PROPOFOL;  Surgeon: Lin Landsman, MD;  Location: [facility] ENDOSCOPY;  Service: Gastroenterology;  Laterality: N/A;   ICD IMPLANT N/A 05/14/2021   Procedure: ICD IMPLANT;  Surgeon: [name], MD;  Location: [place] CV LAB;  Service: Cardiovascular;  Laterality: N/A;   IR CT HEAD LTD  01/30/2021   IR PERCUTANEOUS ART THROMBECTOMY/INFUSION INTRACRANIAL INC DIAG ANGIO  01/30/2021   LEFT HEART CATH AND CORONARY ANGIOGRAPHY N/A 01/28/2021   Procedure: LEFT HEART CATH AND CORONARY ANGIOGRAPHY;  Surgeon: Nelva Bush, MD;  Location: [place] CV LAB;  Service: Cardiovascular;  Laterality: N/A;   RADIOLOGY WITH ANESTHESIA N/A 01/30/2021   Procedure: IR WITH ANESTHESIA;  Surgeon: Luanne Bras, MD;  Location: [place];  Service: Radiology;  Laterality: N/A;   TONSILLECTOMY AND ADENOIDECTOMY     TRANSCAROTID ARTERY REVASCULARIZATION  Left 02/07/2021   Procedure: TRANSCAROTID ARTERY REVASCULARIZATION LEFT;  Surgeon: [name], MD;  Location: [facility] OR;  Service: Vascular;  Laterality: Left;    Current Medications: Current Meds  Medication Sig   amiodarone (PACERONE) 200 MG tablet Take 200 mg by mouth 2 (two) times daily.   carvedilol (COREG) 12.5 MG tablet Take 1 tablet (12.5 mg total) by mouth 2 (two) times daily. Please keep upcoming appointment for future refills. Thank you.   clopidogrel (PLAVIX) 75 MG tablet Take 75 mg by mouth daily.   Coenzyme Q10 (COQ10) 100 MG CAPS Take 1 capsule by mouth every evening.   FARXIGA 10 MG TABS tablet Take 1 tablet (10 mg total) by mouth daily.   levothyroxine (SYNTHROID) 25 MCG tablet Take 25 mcg by mouth daily before breakfast.   mexiletine (MEXITIL) 150 MG capsule Take 1 capsule (150 mg total) by mouth 2 (two) times daily.   rosuvastatin (CRESTOR) 5 MG tablet Take 1 tablet (5 mg total) by mouth daily.   sacubitril-valsartan  (ENTRESTO) 24-26 MG Take 1 tablet by mouth 2 (two) times daily.   vitamin C (ASCORBIC ACID) 500 MG tablet Take 500 mg by mouth daily.     Allergies:   Crestor [rosuvastatin], Livalo [pitavastatin], Pravachol [pravastatin sodium], and Zocor [simvastatin]   Social History   Socioeconomic History   Marital status: Married    Spouse name: Not on file   Number of children: 4   Years of education: Not on file   Highest education level: Not on file  Occupational History   Not on file  Tobacco Use   Smoking status: Former    Packs/day: 2.00    Years: 40.00    Total pack years: 80.00    Types: Cigarettes    Quit date: 2004    Years since quitting: 19.7   Smokeless tobacco: Former    Types: Nurse, children's Use: Never used  Substance and Sexual Activity   Alcohol use: Yes    Alcohol/week: 3.0 standard drinks of alcohol  Types: 3 Cans of beer per week    Comment: occassionally   Drug use: No   Sexual activity: Yes  Other Topics Concern   Not on file  Social History Narrative   Lives at home with wife, works   Social Determinants of Radio broadcast assistant Strain: [place]  (08/02/2018)   Overall Financial Resource Strain (CARDIA)    Difficulty of Paying Living Expenses: Not hard at all  Food Insecurity: No Food Insecurity (08/02/2018)   Hunger Vital Sign    Worried About Running Out of Food in the Last Year: Never true    [place] in the Last Year: Never true  Transportation Needs: Unknown (08/02/2018)   PRAPARE - Hydrologist (Medical): Not on file    Lack of Transportation (Non-Medical): No  Physical Activity: Inactive (08/02/2018)   Exercise Vital Sign    Days of Exercise per Week: 0 days    Minutes of Exercise per Session: 0 min  Stress: No Stress Concern Present (08/02/2018)   [place]    Feeling of Stress : Not at all  Social Connections:  Moderately Integrated (08/02/2018)   Social Connection and Isolation Panel [NHANES]    Frequency of Communication with Friends and Family: Twice a week    Frequency of Social Gatherings with Friends and Family: Twice a week    Attends Religious Services: 1 to 4 times per year    Active Member of [employer] or Organizations: No    Attends Music therapist: Never    Marital Status: Married     Family History: The patient's family history includes Cancer (age of onset: 18) in his mother; Heart attack in his father; Hypertension in his brother and sister.  ROS:   Please see the history of present illness.    All other systems reviewed and are negative.  EKGs/Labs/Other Studies Reviewed:    The following studies were reviewed today:  07/15/2022 in clinic device interrogation personally reviewed Battery longevity okay No high-voltage therapies since the last interrogation.  Last high-voltage/ATP was in April. Lead parameters stable No programming changes made today   Recent Labs: 11/19/2021: ALT 39; TSH 11.593 01/02/2022: BUN 19; Creatinine, Ser 1.30; Magnesium 2.1; Potassium 4.2; Sodium 146  Recent Lipid Panel    Component Value Date/Time   CHOL 119 01/30/2021 0530   CHOL 121 09/13/2012 0819   TRIG 85 01/30/2021 0530   HDL 38 (L) 01/30/2021 0530   HDL 37 (L) 09/13/2012 0819   CHOLHDL 3.1 01/30/2021 0530   VLDL 17 01/30/2021 0530   LDLCALC 64 01/30/2021 0530   LDLCALC 68 09/13/2012 0819    Physical Exam:    VS:  BP 124/68   Pulse (!) 59   Ht 6' (1.829 m)   Wt 192 lb (87.1 kg)   SpO2 96%   BMI 26.04 kg/m     Wt Readings from Last 3 Encounters:  07/15/22 192 lb (87.1 kg)  03/04/22 193 lb 9.6 oz (87.8 kg)  01/02/22 198 lb (89.8 kg)     GEN:  Well nourished, well developed in no acute distress HEENT: Normal NECK: No JVD; No carotid bruits LYMPHATICS: No lymphadenopathy CARDIAC: RRR, no murmurs, rubs, gallops. ICD pocket well healed. RESPIRATORY:  Clear to  auscultation without rales, wheezing or rhonchi  ABDOMEN: Soft, non-tender, non-distended MUSCULOSKELETAL:  No edema; No deformity  SKIN: Warm and dry NEUROLOGIC:  Alert and  oriented x 3 PSYCHIATRIC:  Normal affect        ASSESSMENT:    1. Chronic systolic heart failure ([place])   2. VT (ventricular tachycardia) ([place])   3. ICD (implantable cardioverter-defibrillator) in place   4. Primary hypertension   5. Encounter for long-term (current) use of high-risk medication    PLAN:    In order of problems listed above:  #chronic systolic heart failure #ICM #VT Currently on amiodarone and mexiletine.  Continue the current doses. NYHA class I-II symptoms.   Continue medical therapy including Coreg, Lisabeth Register.   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  Continue Synthroid.  Appreciate assistance of primary care physician.  Needs repeat CMP, TSH and FT4.     Medication Adjustments/Labs and Tests Ordered: Current medicines are reviewed at length with the patient today.  Concerns regarding medicines are outlined above.  No orders of the defined types were placed in this encounter.  No orders of the defined types were placed in this encounter.    Signed, Lars Mage, MD, [facility], [facility] 07/15/2022 8:56 AM    Electrophysiology [facility]

## 2022-07-15 ENCOUNTER — Encounter: Payer: Medicare Other | Admitting: Speech Pathology

## 2022-07-15 ENCOUNTER — Encounter: Payer: Self-pay | Admitting: Cardiology

## 2022-07-15 ENCOUNTER — Ambulatory Visit: Payer: Medicare Other | Attending: Cardiology | Admitting: Cardiology

## 2022-07-15 ENCOUNTER — Other Ambulatory Visit
Admission: RE | Admit: 2022-07-15 | Discharge: 2022-07-15 | Disposition: A | Discharge status: Home / Self Care | Payer: Medicare Other | Attending: Cardiology | Admitting: Cardiology

## 2022-07-15 VITALS — BP 124/68 | HR 59 | Ht 72.0 in | Wt 192.0 lb

## 2022-07-15 DIAGNOSIS — I5022 Chronic systolic (congestive) heart failure: Secondary | ICD-10-CM | POA: Diagnosis not present

## 2022-07-15 DIAGNOSIS — Z79899 Other long term (current) drug therapy: Secondary | ICD-10-CM | POA: Insufficient documentation

## 2022-07-15 DIAGNOSIS — I472 Ventricular tachycardia, unspecified: Secondary | ICD-10-CM | POA: Diagnosis not present

## 2022-07-15 DIAGNOSIS — Z95810 Presence of automatic (implantable) cardiac defibrillator: Secondary | ICD-10-CM | POA: Insufficient documentation

## 2022-07-15 DIAGNOSIS — I10 Essential (primary) hypertension: Secondary | ICD-10-CM | POA: Diagnosis not present

## 2022-07-15 LAB — COMPREHENSIVE METABOLIC PANEL
ALT: 62 U/L — ABNORMAL HIGH (ref 0–44)
AST: 40 U/L (ref 15–41)
Albumin: 4 g/dL (ref 3.5–5.0)
Alkaline Phosphatase: 118 U/L (ref 38–126)
Anion gap: 8 (ref 5–15)
BUN: 19 mg/dL (ref 8–23)
CO2: 26 mmol/L (ref 22–32)
Calcium: 8.9 mg/dL (ref 8.9–10.3)
Chloride: 109 mmol/L (ref 98–111)
Creatinine, Ser: 1.16 mg/dL (ref 0.61–1.24)
GFR, Estimated: >60 mL/min (ref >60)
Glucose, Bld: 80 mg/dL (ref 70–99)
Potassium: 4 mmol/L (ref 3.5–5.1)
Sodium: 143 mmol/L (ref 135–145)
Total Bilirubin: 0.6 mg/dL (ref 0.3–1.2)
Total Protein: 6.6 g/dL (ref 6.5–8.1)

## 2022-07-15 LAB — CUP PACEART INCLINIC DEVICE CHECK
Date Time Interrogation Session: 20231011115835
Implantable Lead Implant Date: 20220810
Implantable Lead Location: 753860
Implantable Lead Model: 436909
Implantable Lead Serial Number: 81459128
Implantable Pulse Generator Implant Date: 20220810
Pulse Gen Model: 429525
Pulse Gen Serial Number: 84855891

## 2022-07-15 LAB — TSH: TSH: 15.112 u[IU]/mL — ABNORMAL HIGH (ref 0.350–4.500)

## 2022-07-15 LAB — T4, FREE: Free T4: 0.72 ng/dL (ref 0.61–1.12)

## 2022-07-15 NOTE — Patient Instructions (Signed)
Medication Instructions:  none *If you need a refill on your cardiac medications before your next appointment, please call your pharmacy*   Lab Work: CMP, TSH, Free T4 Nature conservation officer at [facility] 1st desk on the right to check in Lab hours: 7:30 am- 5:30 pm (walk in basis)  If you have labs (blood work) drawn today and your tests are completely normal, you will receive your results only by: MyChart Message (if you have MyChart) OR A paper copy in the mail If you have any lab test that is abnormal or we need to change your treatment, we will call you to review the results.   Testing/Procedures: None   Follow-Up: At [facility], you and your health needs are our priority.  As part of our continuing mission to provide you with exceptional heart care, we have created designated Provider Care Teams.  These Care Teams include your primary Cardiologist (physician) and Advanced Practice Providers (APPs -  Physician Assistants and Nurse Practitioners) who all work together to provide you with the care you need, when you need it.  We recommend signing up for the patient portal called "MyChart".  Sign up information is provided on this After Visit Summary.  MyChart is used to connect with patients for Virtual Visits (Telemedicine).  Patients are able to view lab/test results, encounter notes, upcoming appointments, etc.  Non-urgent messages can be sent to your provider as well.   To learn more about what you can do with MyChart, go to [email or web address].    Your next appointment:   6 month(s)  The format for your next appointment:   In Person  Provider:   You will see one of the following Advanced Practice Providers on your designated Care Team:   Murray Hodgkins, NP Christell Faith, PA-C Cadence Kathlen Mody, PA-C Gerrie Nordmann, NP      Other Instructions None   Important Information About Sugar

## 2022-07-20 ENCOUNTER — Encounter: Payer: Medicare Other | Admitting: Speech Pathology

## 2022-07-20 ENCOUNTER — Telehealth: Payer: Self-pay | Admitting: *Deleted

## 2022-07-20 MED ORDER — LEVOTHYROXINE SODIUM 25 MCG PO TABS: 37.5000 ug | ORAL_TABLET | Freq: Every day | ORAL | 3 refills | Status: DC

## 2022-07-20 NOTE — Telephone Encounter (Signed)
-----   Message from [name], MD sent at 07/19/2022  1:34 PM EDT ----- Thyroid function slightly low. Recommend increasing your Synthroid to 37.5.   Otila Kluver, can you make this change please?  Lysbeth Galas T. Quentin Ore, MD, [facility], [facility] Cardiac Electrophysiology

## 2022-07-20 NOTE — Telephone Encounter (Signed)
The patient has been notified of the result and verbalized understanding.  All questions (if any) were answered. Darrell Jewel, RN 07/20/2022 12:25 PM    Sent in new prescription.  Patient verbalized understanding.

## 2022-07-21 ENCOUNTER — Other Ambulatory Visit: Payer: Self-pay | Admitting: Cardiology

## 2022-07-22 ENCOUNTER — Encounter: Payer: Medicare Other | Admitting: Speech Pathology

## 2022-07-27 ENCOUNTER — Encounter: Payer: Medicare Other | Admitting: Speech Pathology

## 2022-07-29 ENCOUNTER — Encounter: Payer: Medicare Other | Admitting: Speech Pathology

## 2022-08-03 ENCOUNTER — Other Ambulatory Visit: Payer: Self-pay | Admitting: Cardiology

## 2022-08-03 ENCOUNTER — Encounter: Payer: Medicare Other | Admitting: Speech Pathology

## 2022-08-05 ENCOUNTER — Encounter: Payer: Medicare Other | Admitting: Speech Pathology

## 2022-08-10 ENCOUNTER — Encounter: Payer: Medicare Other | Admitting: Speech Pathology

## 2022-08-12 ENCOUNTER — Encounter: Payer: Medicare Other | Admitting: Speech Pathology

## 2022-08-12 ENCOUNTER — Ambulatory Visit (INDEPENDENT_AMBULATORY_CARE_PROVIDER_SITE_OTHER): Payer: Medicare Other

## 2022-08-12 DIAGNOSIS — I255 Ischemic cardiomyopathy: Secondary | ICD-10-CM

## 2022-08-13 LAB — CUP PACEART REMOTE DEVICE CHECK
Battery Voltage: 3.11 V
Brady Statistic RV Percent Paced: 0 %
Date Time Interrogation Session: 20231108064819
HighPow Impedance: 67 Ohm
Implantable Lead Connection Status: 753985
Implantable Lead Implant Date: 20220810
Implantable Lead Location: 753860
Implantable Lead Model: 436909
Implantable Lead Serial Number: 81459128
Implantable Pulse Generator Implant Date: 20220810
Lead Channel Impedance Value: 502 Ohm
Lead Channel Pacing Threshold Amplitude: 0.9 V
Lead Channel Pacing Threshold Pulse Width: 0.4 ms
Lead Channel Sensing Intrinsic Amplitude: 19.5 mV
Lead Channel Sensing Intrinsic Amplitude: 2.3 mV
Lead Channel Setting Pacing Amplitude: 3 V
Lead Channel Setting Pacing Pulse Width: 0.4 ms
Lead Channel Setting Sensing Sensitivity: 0.8 mV
Pulse Gen Model: 429525
Pulse Gen Serial Number: 84855891

## 2022-08-17 ENCOUNTER — Encounter: Payer: Medicare Other | Admitting: Speech Pathology

## 2022-08-19 ENCOUNTER — Encounter: Payer: Medicare Other | Admitting: Speech Pathology

## 2022-08-24 ENCOUNTER — Encounter: Payer: Medicare Other | Admitting: Speech Pathology

## 2022-08-26 ENCOUNTER — Encounter: Payer: Medicare Other | Admitting: Speech Pathology

## 2022-08-26 NOTE — Progress Notes (Signed)
Remote ICD transmission.   

## 2022-08-31 ENCOUNTER — Encounter: Payer: Medicare Other | Admitting: Speech Pathology

## 2022-09-02 ENCOUNTER — Encounter: Payer: Medicare Other | Admitting: Speech Pathology

## 2022-09-02 ENCOUNTER — Other Ambulatory Visit: Payer: Self-pay | Admitting: Cardiology

## 2022-09-07 ENCOUNTER — Encounter: Payer: Medicare Other | Admitting: Speech Pathology

## 2022-09-09 ENCOUNTER — Encounter: Payer: Medicare Other | Admitting: Speech Pathology

## 2022-09-14 ENCOUNTER — Encounter: Payer: Medicare Other | Admitting: Speech Pathology

## 2022-09-15 ENCOUNTER — Ambulatory Visit: Payer: Medicare Other | Attending: Cardiovascular Disease | Admitting: Cardiovascular Disease

## 2022-09-15 ENCOUNTER — Encounter: Payer: Self-pay | Admitting: Cardiovascular Disease

## 2022-09-15 VITALS — BP 152/74 | HR 54 | Ht 72.0 in | Wt 184.2 lb

## 2022-09-15 DIAGNOSIS — I10 Essential (primary) hypertension: Secondary | ICD-10-CM | POA: Diagnosis not present

## 2022-09-15 DIAGNOSIS — I714 Abdominal aortic aneurysm, without rupture, unspecified: Secondary | ICD-10-CM

## 2022-09-15 DIAGNOSIS — I6330 Cerebral infarction due to thrombosis of unspecified cerebral artery: Secondary | ICD-10-CM

## 2022-09-15 DIAGNOSIS — I5022 Chronic systolic (congestive) heart failure: Secondary | ICD-10-CM | POA: Insufficient documentation

## 2022-09-15 DIAGNOSIS — I2510 Atherosclerotic heart disease of native coronary artery without angina pectoris: Secondary | ICD-10-CM | POA: Diagnosis not present

## 2022-09-15 DIAGNOSIS — E785 Hyperlipidemia, unspecified: Secondary | ICD-10-CM | POA: Diagnosis not present

## 2022-09-15 NOTE — Patient Instructions (Signed)

## 2022-09-15 NOTE — Progress Notes (Unsigned)
Cardiology Office Note   Date:  09/15/2022   ID:  Wells, Mabe Apr 13, 1946, MRN [id number]  PCP:  Derinda Late, MD  Cardiologist:   Kathlyn Sacramento, MD   Chief Complaint  Patient presents with   Follow-up    12 month f/u, no new cardiac concerns       History of Present Illness: Garrett Hunter is a 76 y.o. male who presents for a followup visit regarding coronary artery disease, chronic systolic heart failure and ventricular tachycardia.   He has known history of coronary artery disease status post inferior myocardial infarction in 1996. He was treated with TPA at that time .  He is known to have chronically occluded right coronary artery and small left circumflex with good collaterals. He had ostial ramus artery drug-eluting stent placement in February 2014.   He had endovascular repair of abdominal aortic aneurysm in February 2018 by Dr. Sammuel Hines at [facility].   He was hospitalized in October 2019 with upper GI bleed with melena.  He had an EGD done which showed a gastric ulcer that was cauterized.  His Plavix was discontinued at that time.  He was hospitalized in April 2022 with sustained ventricular tachycardia which required cardioversion.  He was placed on amiodarone.  Echocardiogram showed an EF of 45 to 50%.  Cardiac catheterization showed stable coronary anatomy with patent ramus stent.  During that admission, he suffered a left MCA stroke and received tPA.  He was found to have a 75% stenosis in the left internal carotid artery and subsequently underwent left carotid stenting he was readmitted in June 2022 with GI bleed in the setting of dual antiplatelet therapy.  He had ventricular tachycardia and mexiletine was added to amiodarone.  He also had an ICD placement.  He has been doing reasonably well and denies chest pain or dyspnea.  Over the last 2 years, he lost about 45 pounds.  He continues to have residual neurologic symptoms mainly slurred speech.  Past  Medical History:  Diagnosis Date   AAA (abdominal aortic aneurysm) (HCC)    thoracoabdominal aortic aneursym, s/p right ilio-femoral bypass 10/12/16; 3V FEVAR with SMA, RRA, and LRA stenting 11/16/16 by Dr. Sammuel Hines [facility])   Benign neoplasm of colon    patient not sure   Chronic airway obstruction, not elsewhere classified    pt denies   Chronic HFrrEF (heart failure with midrange ejection fraction) ([place])    a. 01/2021 Echo: EF 45-50%, mild LVH, GrII DD, basal inf AK, nl RV fxn, mildly dil LA, triv MR. Ao root 77m.   Coronary artery disease    a. 1996 Inf MI: TPA; b. 2011 Cath: CTO pRCA/pLCX; c. 07/2012 NSTEMI/Cath ([place]): plaque rupture in pRamius (stenting); d. 01/2021 Cath (in setting of VT): LM nl, LAD min irregs, RI patent stent, LCX 100p, OM2 fills via collats from D1, LPL1 - collats from dLAD, RCA 100p, RPDA - collats from Sept1/2.   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 01/2021 Echo: Ao root 37m   Gastric ulcer    a. 07/2018 s/p cauterization.   Hepatitis A    teenager, no longer a problem   Hyperlipidemia    Intolerance to statins.   Hypertension    Left carotid stenosis    a. 02/2021 s/p9 x 40 Enroute L carotid stent.   [place]   [place] 3 - last one 2013   Renal artery stenosis ([place]   a. 07/2018 s/p  bilateral renal artery angioplasty.   Stroke [facility]) 01/2021   Ventricular tachycardia ([place])    a. 01/2021 s/p DCCV; b. 05/2021 s/p Biotronik Acticor 7 VR-T DX AICD (ser# [id number]).    Past Surgical History:  Procedure Laterality Date   ABDOMINAL AORTIC ANEURYSM REPAIR     11/16/2016   ANGIOPLASTY     CARDIAC CATHETERIZATION  2011   CARDIAC CATHETERIZATION  2/14   [facility]; 95% lesion in the ramus intermedius.    CATARACT EXTRACTION W/PHACO Left 12/19/2018   Procedure: CATARACT EXTRACTION PHACO AND INTRAOCULAR LENS PLACEMENT ([place])  LEFT;  Surgeon: Eulogio Bear, MD;  Location: [place];  Service: Ophthalmology;  Laterality: Left;   CATARACT EXTRACTION  W/PHACO Right 03/06/2019   Procedure: CATARACT EXTRACTION PHACO AND INTRAOCULAR LENS PLACEMENT (I[facility])RIGHT;  Surgeon: Eulogio Bear, MD;  Location: [place];  Service: Ophthalmology;  Laterality: Right;   COLONOSCOPY WITH PROPOFOL N/A 03/20/2021   Procedure: COLONOSCOPY WITH PROPOFOL;  Surgeon: Lin Landsman, MD;  Location: [facility] ENDOSCOPY;  Service: Gastroenterology;  Laterality: N/A;   CORONARY ANGIOPLASTY  1996   [facility] x1   CORONARY ANGIOPLASTY  2/14   Severe restenosis in the ostial ramus. Status post angioplasty and drug-eluting stent placement with a 2.5 x 18 mm Xience drug-eluting stent   ESOPHAGOGASTRODUODENOSCOPY N/A 08/03/2018   Procedure: ESOPHAGOGASTRODUODENOSCOPY (EGD);  Surgeon: Lin Landsman, MD;  Location: [facility] ENDOSCOPY;  Service: Gastroenterology;  Laterality: N/A;   ESOPHAGOGASTRODUODENOSCOPY (EGD) WITH PROPOFOL N/A 11/03/2018   Procedure: ESOPHAGOGASTRODUODENOSCOPY (EGD) WITH PROPOFOL;  Surgeon: Lin Landsman, MD;  Location: [facility] ENDOSCOPY;  Service: Gastroenterology;  Laterality: N/A;   ESOPHAGOGASTRODUODENOSCOPY (EGD) WITH PROPOFOL N/A 12/06/2018   Procedure: ESOPHAGOGASTRODUODENOSCOPY (EGD) WITH PROPOFOL;  Surgeon: Lucilla Lame, MD;  Location: [facility] ENDOSCOPY;  Service: Endoscopy;  Laterality: N/A;   ESOPHAGOGASTRODUODENOSCOPY (EGD) WITH PROPOFOL N/A 03/19/2021   Procedure: ESOPHAGOGASTRODUODENOSCOPY (EGD) WITH PROPOFOL;  Surgeon: Lin Landsman, MD;  Location: [facility] ENDOSCOPY;  Service: Gastroenterology;  Laterality: N/A;   ICD IMPLANT N/A 05/14/2021   Procedure: ICD IMPLANT;  Surgeon: [name], MD;  Location: [place] CV LAB;  Service: Cardiovascular;  Laterality: N/A;   IR CT HEAD LTD  01/30/2021   IR PERCUTANEOUS ART THROMBECTOMY/INFUSION INTRACRANIAL INC DIAG ANGIO  01/30/2021   LEFT HEART CATH AND CORONARY ANGIOGRAPHY N/A 01/28/2021   Procedure: LEFT HEART CATH AND CORONARY ANGIOGRAPHY;  Surgeon: Nelva Bush, MD;  Location:  [place] CV LAB;  Service: Cardiovascular;  Laterality: N/A;   RADIOLOGY WITH ANESTHESIA N/A 01/30/2021   Procedure: IR WITH ANESTHESIA;  Surgeon: Luanne Bras, MD;  Location: [place];  Service: Radiology;  Laterality: N/A;   TONSILLECTOMY AND ADENOIDECTOMY     TRANSCAROTID ARTERY REVASCULARIZATION  Left 02/07/2021   Procedure: TRANSCAROTID ARTERY REVASCULARIZATION LEFT;  Surgeon: [name], MD;  Location: [facility] OR;  Service: Vascular;  Laterality: Left;     Current Outpatient Medications  Medication Sig Dispense Refill   amiodarone (PACERONE) 200 MG tablet Take 200 mg by mouth 2 (two) times daily.     carvedilol (COREG) 12.5 MG tablet TAKE 1 TABLET TWICE A DAY 180 tablet 1   clopidogrel (PLAVIX) 75 MG tablet Take 75 mg by mouth daily.     Coenzyme Q10 (COQ10) 100 MG CAPS Take 1 capsule by mouth every evening.     FARXIGA 10 MG TABS tablet TAKE 1 TABLET BY MOUTH EVERY DAY 90 tablet 3   levothyroxine (SYNTHROID) 25 MCG tablet Take 1.5 tablets (37.5 mcg total)  by mouth daily before breakfast. (Patient taking differently: Take 50 mcg by mouth daily before breakfast.) 135 tablet 3   mexiletine (MEXITIL) 150 MG capsule TAKE 1 CAPSULE BY MOUTH TWICE A DAY 180 capsule 1   rosuvastatin (CRESTOR) 5 MG tablet Take 1 tablet (5 mg total) by mouth daily. 90 tablet 3   sacubitril-valsartan (ENTRESTO) 24-26 MG Take 1 tablet by mouth 2 (two) times daily.     vitamin C (ASCORBIC ACID) 500 MG tablet Take 500 mg by mouth daily.     No current facility-administered medications for this visit.    Allergies:   Crestor [rosuvastatin], Livalo [pitavastatin], Pravachol [pravastatin sodium], and Zocor [simvastatin]    Social History:  The patient  reports that he quit smoking about 19 years ago. His smoking use included cigarettes. He has a 80.00 pack-year smoking history. He has never used smokeless tobacco. He reports current alcohol use of about 3.0 standard drinks of alcohol per week. He reports  that he does not use drugs.   Family History:  The patient's family history includes Cancer (age of onset: 22) in his mother; Heart attack in his father; Hypertension in his brother and sister.    ROS:  Please see the history of present illness.   Otherwise, review of systems are positive for none.   All other systems are reviewed and negative.    PHYSICAL EXAM: VS:  BP (!) 152/74 (BP Location: Left Arm, Patient Position: Sitting, Cuff Size: Normal)   Pulse (!) 54   Ht 6' (1.829 m)   Wt 184 lb 3.2 oz (83.6 kg)   SpO2 98%   BMI 24.98 kg/m  , BMI Body mass index is 24.98 kg/m. GEN: Well nourished, well developed, in no acute distress  HEENT: normal  Neck: no JVD, carotid bruits, or masses Cardiac: RRR; no murmurs, rubs, or gallops,no edema  Respiratory:  clear to auscultation bilaterally, normal work of breathing GI: soft, nontender, nondistended, + BS MS: no deformity or atrophy  Skin: warm and dry, no rash Neuro:  Strength and sensation are intact Psych: euthymic mood, full affect Radial pulses normal bilaterally.  Ulnar pulses slightly diminished bilaterally.   EKG:  EKG is ordered today. The ekg ordered today demonstrates normal sinus rhythm with nonspecific IVCD and possible old inferior infarct  Recent Labs: 01/02/2022: Magnesium 2.1 07/15/2022: ALT 62; BUN 19; Creatinine, Ser 1.16; Potassium 4.0; Sodium 143; TSH 15.112    Lipid Panel    Component Value Date/Time   CHOL 119 01/30/2021 0530   CHOL 121 09/13/2012 0819   TRIG 85 01/30/2021 0530   HDL 38 (L) 01/30/2021 0530   HDL 37 (L) 09/13/2012 0819   CHOLHDL 3.1 01/30/2021 0530   VLDL 17 01/30/2021 0530   LDLCALC 64 01/30/2021 0530   LDLCALC 68 09/13/2012 0819      Wt Readings from Last 3 Encounters:  09/15/22 184 lb 3.2 oz (83.6 kg)  07/15/22 192 lb (87.1 kg)  03/04/22 193 lb 9.6 oz (87.8 kg)        ASSESSMENT AND PLAN:  1.  Coronary artery disease involving native coronary arteries without angina:  He is doing well with no anginal symptoms.  Continue Plavix without aspirin.  2. Chronic systolic heart failure: Mildly reduced ejection fraction due to ischemic cardiomyopathy.  Currently [place] heart association class II.  He is on optimal medical therapy.  He appears to be euvolemic without a diuretic.    3. Abdominal aortic aneurysm:  Status post  endovascular repair. Followed at [facility] by Dr. Sammuel Hines.   4. Hyperlipidemia: He has known intolerance to statins but has been able to tolerate small dose rosuvastatin.    5. Essential hypertension: Blood pressure was initially elevated but rechecked by me and it was 138/80.  6.  Sustained ventricular tachycardia: Status post ICD placement.  Currently controlled with amiodarone and mexiletine.  7.  Carotid artery disease status post left ICA stent.  He is on Plavix.   Disposition:   FU with me in 6 months.   Signed,  Kathlyn Sacramento, MD  09/15/2022 4:11 PM    [place]

## 2022-09-16 ENCOUNTER — Encounter: Payer: Medicare Other | Admitting: Speech Pathology

## 2022-09-21 ENCOUNTER — Encounter: Payer: Medicare Other | Admitting: Speech Pathology

## 2022-09-23 ENCOUNTER — Encounter: Payer: Medicare Other | Admitting: Speech Pathology

## 2022-09-30 ENCOUNTER — Encounter: Payer: Medicare Other | Admitting: Speech Pathology

## 2022-10-06 ENCOUNTER — Telehealth: Payer: Self-pay | Admitting: Cardiology

## 2022-10-06 NOTE — Telephone Encounter (Signed)
Left a message for the patient to call back.  

## 2022-10-06 NOTE — Telephone Encounter (Signed)
Pt c/o medication issue:  1. Name of Medication: amiodarone (PACERONE) 200 MG tablet   2. How are you currently taking this medication (dosage and times per day)? 2 tablets daily  3. Are you having a reaction (difficulty breathing--STAT)? no  4. What is your medication issue? Patient's wife states the medication was increased from 1 tablet daily to 2 tablets daily. She says the new prescription was not sent to the pharmacy yet and he is running out. She says he has enough for a week and requests it be sent to [facility] in [place].

## 2022-10-07 MED ORDER — AMIODARONE HCL 200 MG PO TABS: 200.0000 mg | ORAL_TABLET | Freq: Two times a day (BID) | ORAL | 3 refills | Status: DC

## 2022-10-07 NOTE — Telephone Encounter (Signed)
Wife called stating pt amiodarone was increased to 200 mg BID on 01/02/22 but was never sent to the pharmacy. She report pt is running low on medication and need updated prescription sent to pharmacy.  Updated [facility] [place].

## 2022-10-07 NOTE — Telephone Encounter (Signed)
Wife returned RN's call. 

## 2022-10-17 ENCOUNTER — Other Ambulatory Visit: Payer: Self-pay | Admitting: Cardiovascular Disease

## 2022-11-11 ENCOUNTER — Ambulatory Visit (INDEPENDENT_AMBULATORY_CARE_PROVIDER_SITE_OTHER): Payer: Medicare Other

## 2022-11-11 DIAGNOSIS — I255 Ischemic cardiomyopathy: Secondary | ICD-10-CM

## 2022-11-11 LAB — CUP PACEART REMOTE DEVICE CHECK
Date Time Interrogation Session: 20240207071839
Implantable Lead Connection Status: 753985
Implantable Lead Implant Date: 20220810
Implantable Lead Location: 753860
Implantable Lead Model: 436909
Implantable Lead Serial Number: 81459128
Implantable Pulse Generator Implant Date: 20220810
Pulse Gen Model: 429525
Pulse Gen Serial Number: 84855891

## 2022-12-08 NOTE — Progress Notes (Signed)
Remote ICD transmission.   

## 2022-12-21 ENCOUNTER — Encounter: Payer: Self-pay | Admitting: Neurology

## 2022-12-21 ENCOUNTER — Ambulatory Visit (INDEPENDENT_AMBULATORY_CARE_PROVIDER_SITE_OTHER): Payer: Medicare Other | Admitting: Neurology

## 2022-12-21 VITALS — BP 126/68 | HR 58 | Ht 72.0 in | Wt 185.0 lb

## 2022-12-21 DIAGNOSIS — I6522 Occlusion and stenosis of left carotid artery: Secondary | ICD-10-CM

## 2022-12-21 DIAGNOSIS — Z8673 Personal history of transient ischemic attack (TIA), and cerebral infarction without residual deficits: Secondary | ICD-10-CM | POA: Diagnosis not present

## 2022-12-21 NOTE — Progress Notes (Signed)
[facility] [address] [place]. [place] [address] [phone]       OFFICE FOLLOW-UP NOTE  [name] Date of Birth:  [birth year] Medical Record Number:  [id number]   HPI: Initial visit 05/26/2021 Garrett Hunter is a pleasant 77 year old Caucasian male seen today for initial office follow-up visit following hospital admission for stroke in April 2022.  He is accompanied by his wife today.  History is obtained from them and review of electronic medical records and I personally reviewed available pertinent imaging films in PACS. [name] is a 77 y.o. male with history of HTN, HLD, prior MI, CAD, AAA, CHD, ICM who was initially admitted to CVICU for sustained Vtach with cardioversion to NSR and EP evaluation who had a CODE STROKE called on 01/30/2021 after sudden status change with aphasia and R sided facial droop noted by RN. CT Head confirmed that the patient had a small L MCA infarct involving the L insula and frontal operculum. Patient received tPA. CTA Head noted an acute occlusion of the L MCA at M3 and patient had unsuccesful thrombectomy.  Attempt.  MRI scan showed moderate size left frontal MCA branch infarct.  2D echo showed diminished ejection fraction of 45 to 50% with left ventricular global hypokinesis and mildly dilated left atrium.  Carotid ultrasound showed 1-39% right ICA and 40-59% left ICA stenosis.  LDL cholesterol 64 mg percent.  Hemoglobin A1c was 6.0.  Patient was started on dual antiplatelet therapy of aspirin and Plavix for 3 months.  He was seen by vascular surgery and underwent elective left carotid revascularization on 02/07/2021 by Dr. Trula Slade with a TCAR procedure.  He also had electively ICD implanted by Dr. Quentin Ore on 05/14/2021 for his wide-complex tachycardia.[name]  He was admitted for melena on 03/16/2021 and had colonoscopy but no active bleeding source of identified.  An upper endoscopy as well and 1 small ulcer was noted which was  unlikely to be the bleeding source.  Aspirin was discontinued and he was discharged on proton pump inhibitors for 2 months.  Patient states that his speech is improving but he still has some word finding difficulties and has to search for words and talks slowly.  He has some good days and bad days.  When he gets excited or tries to talk fast he notices more difficulty.  He feels his taste has also not improved.  He has had no recurrent stroke or TIA symptoms.  He is currently getting home speech therapy which is about 2 and this week.  He has no new complaints.  Is tolerating Plavix well but does have some minor skin bruising.  Blood pressures well controlled today it is 120/69.  He remains on Crestor which is tolerating well without muscle aches and pains.  He did have follow-up carotid ultrasound in 03/10/2021 in vascular surgeon office which showed no significant restenosis and patent left ICA stent. Update 12/11/2021 : He returns for follow-up after last visit 6 months ago.  He is doing well.  He has had no recurrent stroke or TIA symptoms.  He is remains on Plavix which is tolerating well with minor bruising but no bleeding.  His blood pressure is under good control and today it is 116/59.  He is tolerating Crestor well without muscle aches and pains.  He still has some occasional word hesitancy and nonfluent speech particularly when he is tired.  He still getting outpatient speech therapy at [facility].  He had follow-up lipid profile  checked on 09/30/2021 and LDL cholesterol was 47 mg percent and hemoglobin A1c was 5.5.  He has not had any follow-up with vascular surgery or follow-up carotid ultrasound for more than 6 months.  He has no complaints today Update 12/21/2022 : He returns for follow-up after last visit a year ago.  He states he is doing well.  He is no recurrent TIA or stroke symptoms.  Remains on Plavix which is tolerating well without bleeding but does bruise quite easily.  His blood pressure is under  good control today it is 126/68.  He remains on Crestor which is tolerating well without muscle aches and pains.  He lipid profile on 07/27/2024 showed LDL cholesterol to be 56 mg percent and hemoglobin A1c to be 5.7.  Carotid ultrasound on 12/30/2021 showed no significant extracranial stenosis.  Loop recorder interrogation on 11/08/22 shows no evidence of paroxysmal atrial fibrillation yet.  He has no new complaints today. ROS:   14 system review of systems is positive for petechiae, bruising, s and all other systems negative  PMH:  Past Medical History:  Diagnosis Date   AAA (abdominal aortic aneurysm) (HCC)    thoracoabdominal aortic aneursym, s/p right ilio-femoral bypass 10/12/16; 3V FEVAR with SMA, RRA, and LRA stenting 11/16/16 by Dr. 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 01/2021 Echo: EF 45-50%, mild LVH, GrII DD, basal inf AK, nl RV fxn, mildly dil LA, triv MR. Ao root 36mm.   Coronary artery disease    a. 1996 Inf MI: TPA; b. 2011 Cath: CTO pRCA/pLCX; c. 07/2012 NSTEMI/Cath ([place]): plaque rupture in pRamius (stenting); d. 01/2021 Cath (in setting of VT): LM nl, LAD min irregs, RI patent stent, LCX 100p, OM2 fills via collats from D1, LPL1 - collats from dLAD, RCA 100p, RPDA - collats from Sept1/2.   Dilated aortic root ([place])    a. 01/2021 Echo: Ao root 84mm.   Gastric ulcer    a. 07/2018 s/p cauterization.   Hepatitis A    teenager, no longer a problem   Hyperlipidemia    Intolerance to statins.   Hypertension    Left carotid stenosis    a. 02/2021 s/p9 x 40 Enroute L carotid stent.   MI (myocardial infarction) ([place])    x 3 - last one 2013   Renal artery stenosis ([place])    a. 07/2018 s/p bilateral renal artery angioplasty.   Stroke [facility]) 01/2021   Ventricular tachycardia ([place])    a. 01/2021 s/p DCCV; b. 05/2021 s/p  Biotronik Acticor 7 VR-T DX AICD (ser# [id number]).    Social History:  Social History   Socioeconomic History   Marital status: Married    Spouse name: Not on file   Number of children: 4   Years of education: Not on file   Highest education level: Not on file  Occupational History   Not on file  Tobacco Use   Smoking status: Former    Packs/day: 2.00    Years: 40.00    Additional pack years: 0.00    Total pack years: 80.00    Types: Cigarettes    Quit date: 2004    Years since quitting: 20.2   Smokeless tobacco: Never  Vaping Use   Vaping Use: Never used  Substance and Sexual Activity   Alcohol  use: Yes    Alcohol/week: 3.0 standard drinks of alcohol    Types: 3 Cans of beer per week    Comment: occassionally   Drug use: No   Sexual activity: Yes  Other Topics Concern   Not on file  Social History Narrative   Lives at home with wife, works   Social Determinants of Radio broadcast assistant Strain: [place]  (08/02/2018)   Overall Financial Resource Strain (CARDIA)    Difficulty of Paying Living Expenses: Not hard at all  Food Insecurity: No Food Insecurity (08/02/2018)   Hunger Vital Sign    Worried About Running Out of Food in the Last Year: Never true    [place] in the Last Year: Never true  Transportation Needs: Unknown (08/02/2018)   PRAPARE - Hydrologist (Medical): Not on file    Lack of Transportation (Non-Medical): No  Physical Activity: Inactive (08/02/2018)   Exercise Vital Sign    Days of Exercise per Week: 0 days    Minutes of Exercise per Session: 0 min  Stress: No Stress Concern Present (08/02/2018)   [place]    Feeling of Stress : Not at all  Social Connections: Moderately Integrated (08/02/2018)   Social Connection and Isolation Panel [NHANES]    Frequency of Communication with Friends and Family: Twice a week    Frequency of Social  Gatherings with Friends and Family: Twice a week    Attends Religious Services: 1 to 4 times per year    Active Member of [employer] or Organizations: No    Attends Archivist Meetings: Never    Marital Status: Married  Human resources officer Violence: Not At Risk (08/02/2018)   Humiliation, Afraid, Rape, and Kick questionnaire    Fear of Current or Ex-Partner: No    Emotionally Abused: No    Physically Abused: No    Sexually Abused: No    Medications:   Current Outpatient Medications on File Prior to Visit  Medication Sig Dispense Refill   levothyroxine (SYNTHROID) 50 MCG tablet Take 50 mcg by mouth every morning. 2 tabs of 25 mcg daily     amiodarone (PACERONE) 200 MG tablet Take 1 tablet (200 mg total) by mouth 2 (two) times daily. 180 tablet 3   carvedilol (COREG) 12.5 MG tablet TAKE 1 TABLET TWICE A DAY 180 tablet 1   clopidogrel (PLAVIX) 75 MG tablet Take 75 mg by mouth daily.     Coenzyme Q10 (COQ10) 100 MG CAPS Take 1 capsule by mouth every evening.     FARXIGA 10 MG TABS tablet TAKE 1 TABLET BY MOUTH EVERY DAY 90 tablet 3   mexiletine (MEXITIL) 150 MG capsule TAKE 1 CAPSULE BY MOUTH TWICE A DAY 180 capsule 1   rosuvastatin (CRESTOR) 5 MG tablet TAKE 1 TABLET DAILY 90 tablet 1   sacubitril-valsartan (ENTRESTO) 24-26 MG Take 1 tablet by mouth 2 (two) times daily.     vitamin C (ASCORBIC ACID) 500 MG tablet Take 500 mg by mouth daily.     No current facility-administered medications on file prior to visit.    Allergies:   Allergies  Allergen Reactions   Crestor [Rosuvastatin]     Cramps. (Hight dose of Crestor)   Livalo [Pitavastatin]     myalgia   Pravachol [Pravastatin Sodium]     Cramps   Zocor [Simvastatin]     Cramps    Physical Exam  General: well developed, well nourished, pleasant elderly Caucasian male seated, in no evident distress Head: head normocephalic and atraumatic.  Neck: supple with no carotid or supraclavicular bruits Cardiovascular: regular rate  and rhythm, no murmurs Musculoskeletal: no deformity Skin:  no rash scattered petichiae on the skin of forearms and hands bilaterally Vascular:  Normal pulses all extremities Vitals:   12/21/22 1450  BP: 126/68  Pulse: (!) 58   Neurologic Exam Mental Status: Awake and fully alert. Oriented to place and time. Recent and remote memory intact. Attention span, concentration and fund of knowledge appropriate. Mood and affect appropriate.  Mild expressive aphasia with nonfluent speech with some word finding difficulties and occasional paraphasic errors.  Good comprehension and naming.  Poor repetition. Cranial Nerves: Fundoscopic exam not done. Pupils equal, briskly reactive to light. Extraocular movements full without nystagmus. Visual fields full to confrontation. Hearing intact. Facial sensation intact. Face, tongue, palate moves normally and symmetrically.  Motor: Normal bulk and tone. Normal strength in all tested extremity muscles. Sensory.: intact to touch ,pinprick .position and vibratory sensation.  Coordination: Rapid alternating movements normal in all extremities. Finger-to-nose and heel-to-shin performed accurately bilaterally. Gait and Station: Arises from chair without difficulty. Stance is normal. Gait demonstrates normal stride length and balance . Able to heel, toe and tandem walk without any  difficulty.  Reflexes: 1+ and symmetric. Toes downgoing.      ASSESSMENT: 77 year old Caucasian male with left frontal MCA branch infarct symptomatic from proximal left carotid stenosis in April 2022 treated with IV tPA but with failed attempt at thrombectomy.  S/p elective left carotid revascularization via TCAR procedure on 02/07/2021 by Dr. Trula Slade.  Vascular risk factors of hyperlipidemia, hypertension, coronary artery disease, cardiomyopathy and congestive heart failure.     PLAN: I had a long d/w patient about his remote stroke and carotid stenosis,, risk for recurrent stroke/TIAs,  personally independently reviewed imaging studies and stroke evaluation results and answered questions.Continue Plavixl 75 mg daily  for secondary stroke prevention and maintain strict control of hypertension with blood pressure goal below 130/90, diabetes with hemoglobin A1c goal below 6.5% and lipids with LDL cholesterol goal below 70 mg/dL. I also advised the patient to eat a healthy diet with plenty of whole grains, cereals, fruits and vegetables, exercise regularly and maintain ideal body weight check follow-up carotid ultrasound for surveillance.   Return for follow-up in the future only as neededGreater than 50% of time during this 35 minute visit was spent on counseling,explanation of diagnosis, of carotid stenosis and stroke and aphasia planning of further management, discussion with patient and family and coordination of care Antony Contras, MD Note: This document was prepared with digital dictation and possible smart phrase technology. Any transcriptional errors that result from this process are unintentional

## 2022-12-21 NOTE — Patient Instructions (Signed)
I had a long d/w patient about his remote stroke and carotid stenosis,, risk for recurrent stroke/TIAs, personally independently reviewed imaging studies and stroke evaluation results and answered questions.Continue Plavixl 75 mg daily  for secondary stroke prevention and maintain strict control of hypertension with blood pressure goal below 130/90, diabetes with hemoglobin A1c goal below 6.5% and lipids with LDL cholesterol goal below 70 mg/dL. I also advised the patient to eat a healthy diet with plenty of whole grains, cereals, fruits and vegetables, exercise regularly and maintain ideal body weight check follow-up carotid ultrasound for surveillance.  Return for follow-up in the future only as needed

## 2023-01-06 ENCOUNTER — Ambulatory Visit (HOSPITAL_COMMUNITY): Payer: Medicare Other

## 2023-01-11 IMAGING — CR DG CHEST 2V
3 series · 3 of 3 positions shown · non-contrast
Comparison: January 27, 2021.

CLINICAL DATA: Pacemaker placement.

EXAM:
CHEST - 2 VIEW

[chest ap (1 of 2)]
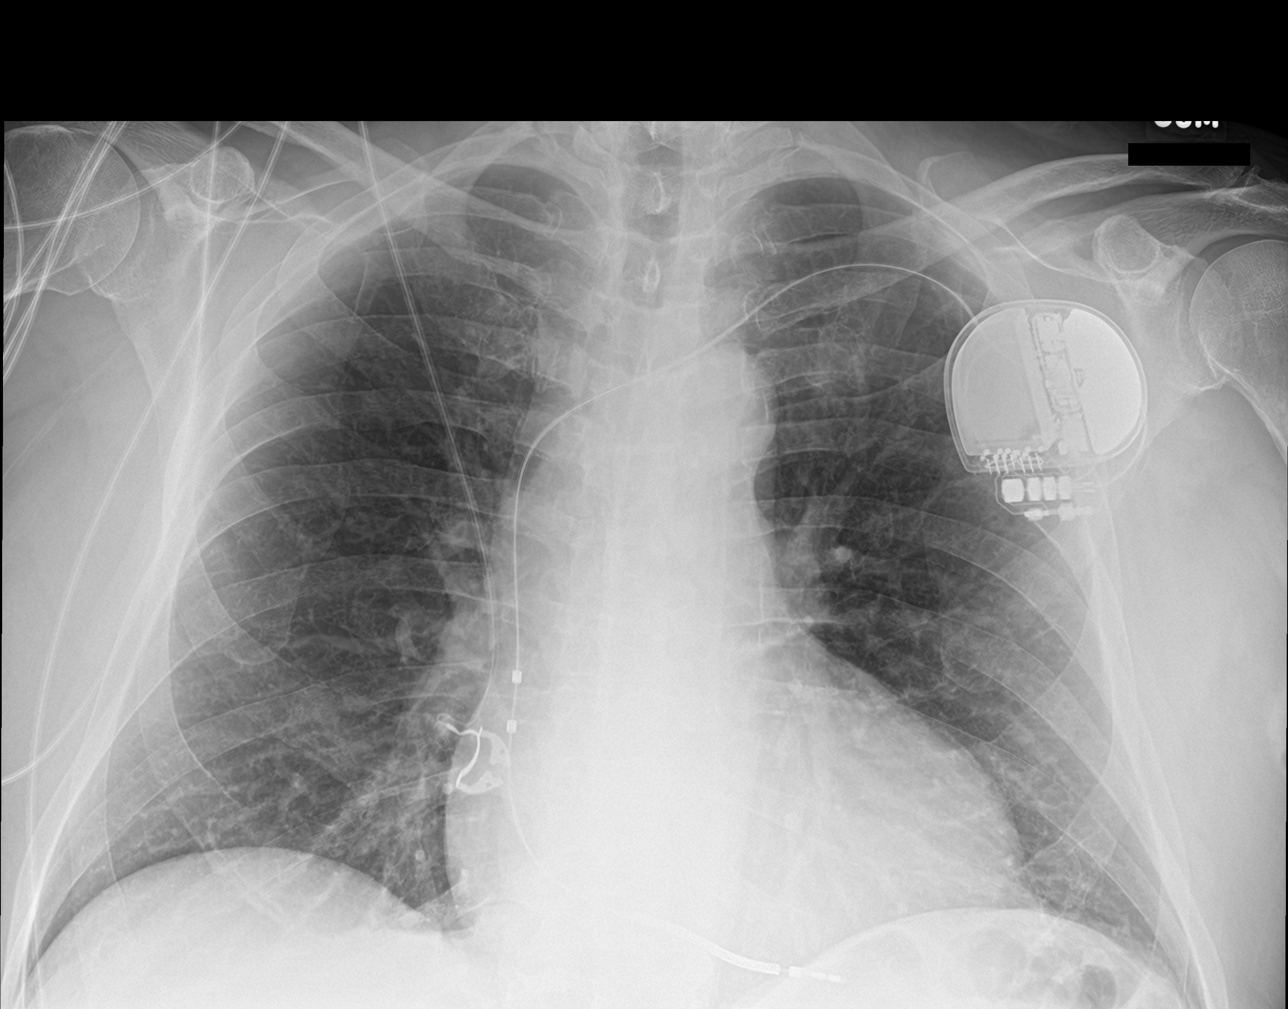

[chest lat]
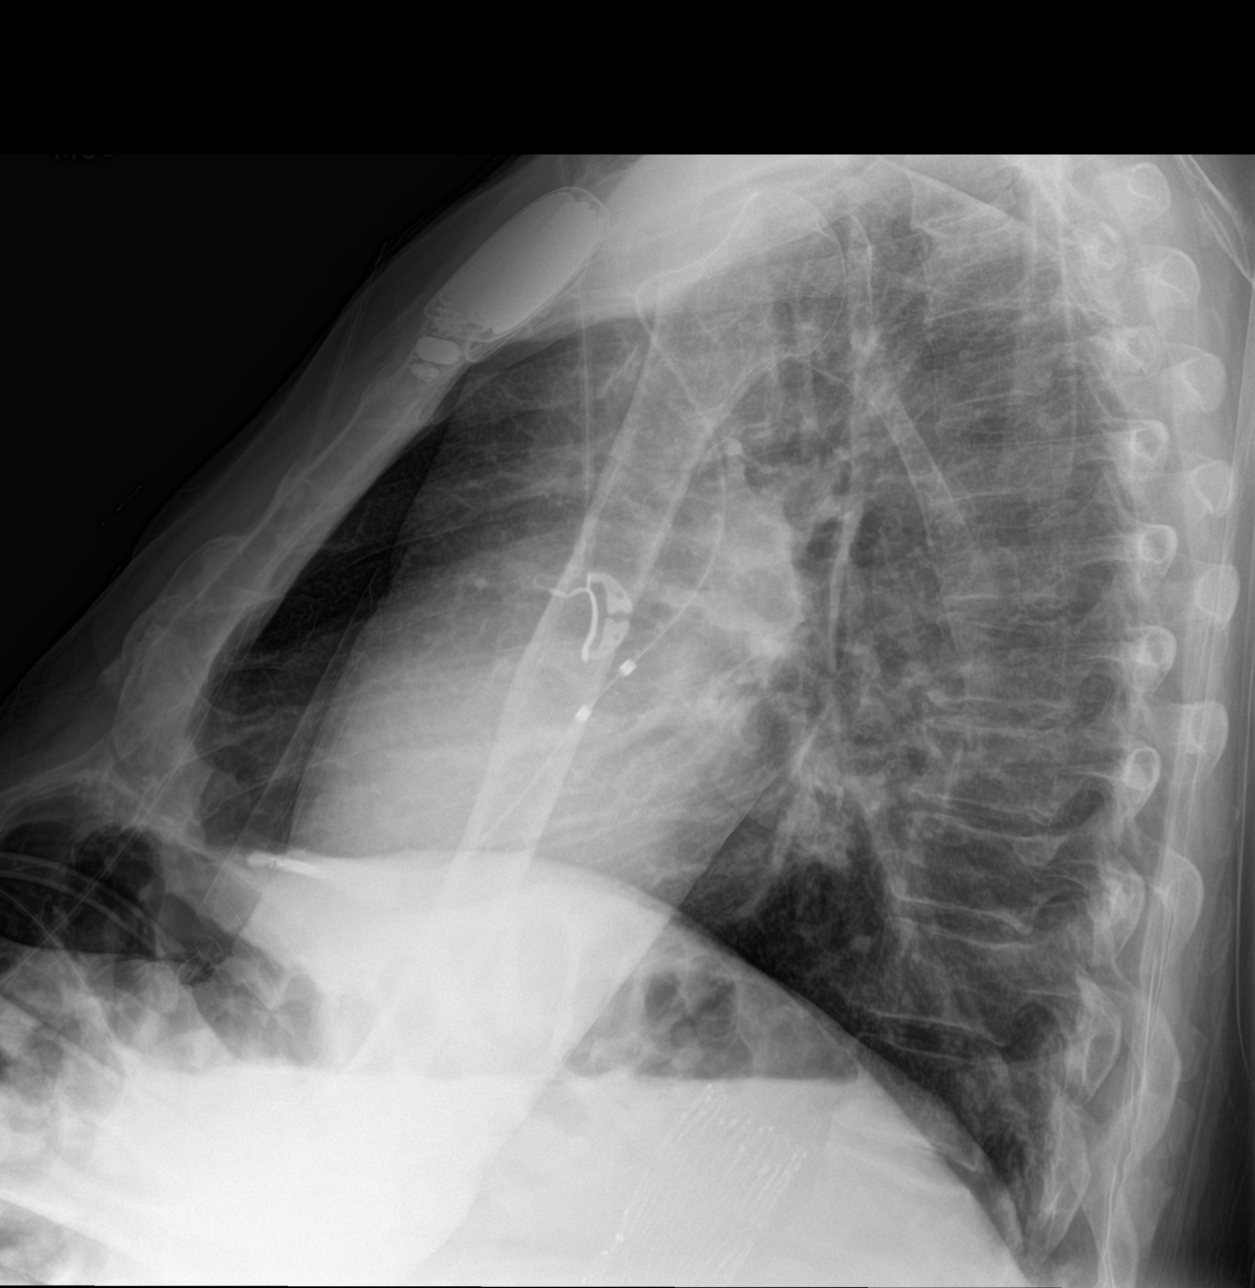

[chest ap (2 of 2)]
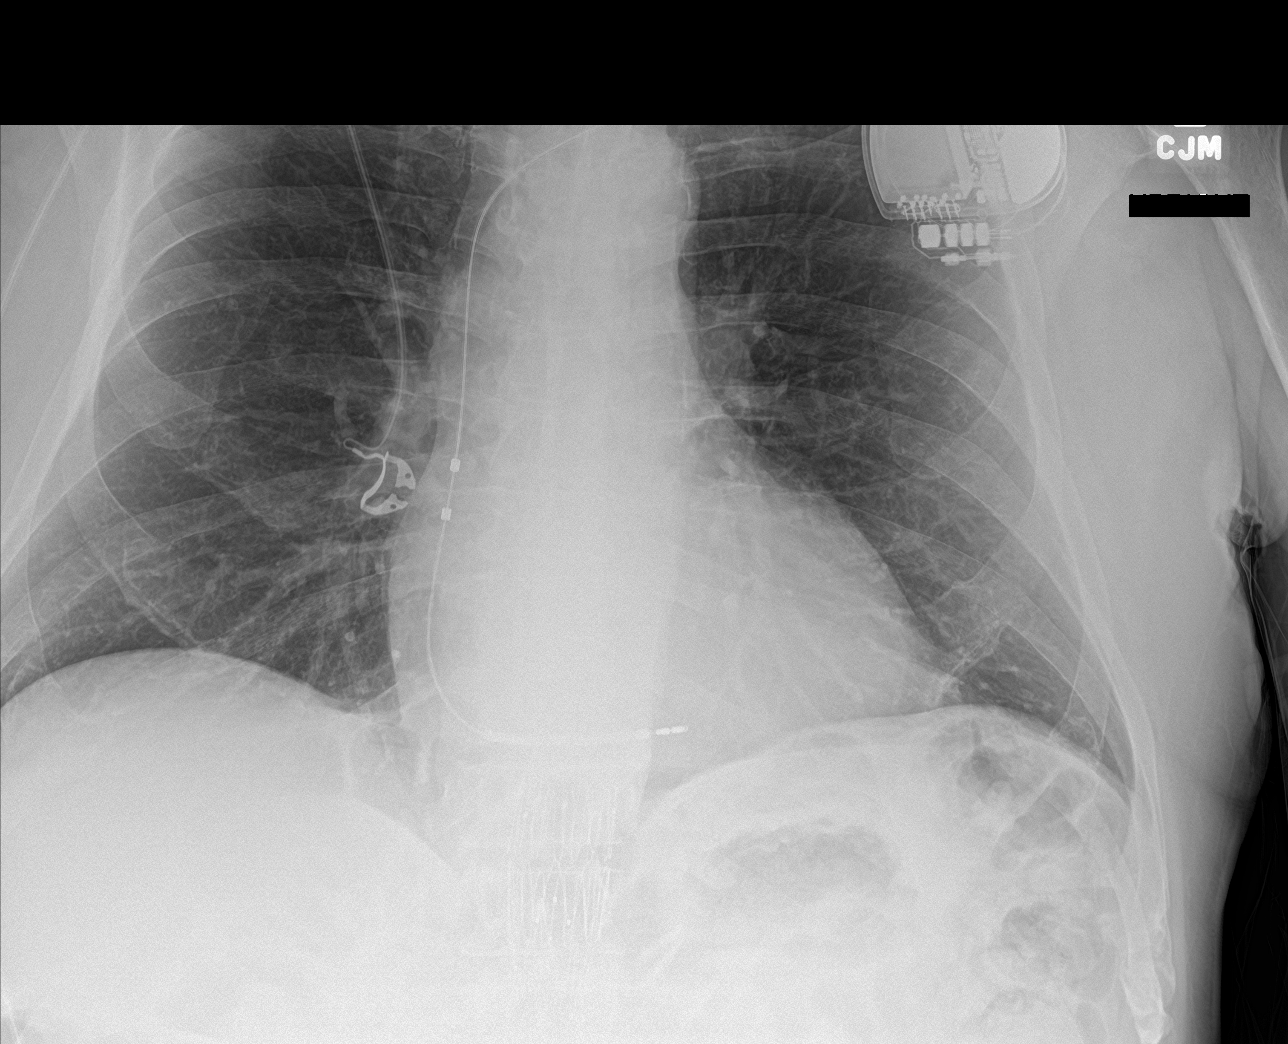

[3 of 3 positions shown; findings below may reference images not displayed]

FINDINGS: The heart size and mediastinal contours are within normal limits.
Both lungs are clear. Interval placement of left-sided single lead
pacemaker, with lead in grossly good position. No pneumothorax is
noted. The visualized skeletal structures are unremarkable.
IMPRESSION: Interval placement of left-sided pacemaker with lead in grossly good
position.

## 2023-01-21 ENCOUNTER — Other Ambulatory Visit: Payer: Self-pay | Admitting: Cardiology

## 2023-01-25 ENCOUNTER — Other Ambulatory Visit: Payer: Self-pay | Admitting: Cardiovascular Disease

## 2023-01-27 ENCOUNTER — Ambulatory Visit (HOSPITAL_COMMUNITY)
Admission: RE | Admit: 2023-01-27 | Discharge: 2023-01-27 | Disposition: A | Discharge status: Home / Self Care | Payer: Medicare Other | Source: Ambulatory Visit | Attending: Neurology | Admitting: Neurology

## 2023-01-27 DIAGNOSIS — Z8673 Personal history of transient ischemic attack (TIA), and cerebral infarction without residual deficits: Secondary | ICD-10-CM | POA: Diagnosis present

## 2023-01-27 NOTE — Progress Notes (Signed)
Carotid artery duplex has been completed. Preliminary results can be found in CV Proc through chart review.   01/27/23 10:01 AM Olen Cordial RVT

## 2023-02-05 NOTE — Progress Notes (Signed)
Kindly inform the patient that carotid ultrasound shows mild less than 50% narrowing of the left carotid stent.  No significant narrowing on the right

## 2023-02-06 ENCOUNTER — Other Ambulatory Visit: Payer: Self-pay | Admitting: Cardiology

## 2023-02-08 ENCOUNTER — Telehealth: Payer: Self-pay

## 2023-02-08 NOTE — Telephone Encounter (Signed)
Called patient LVM #1  "If patient calls back please advise them that per Dr. Pearlean Brownie "the patient that carotid ultrasound shows mild less than 50% narrowing of the left carotid stent.  No significant narrowing on the right ".

## 2023-02-08 NOTE — Telephone Encounter (Signed)
-----   Message from Deatra James, RN sent at 02/08/2023  9:33 AM EDT -----  ----- Message ----- From: Micki Riley, MD Sent: 02/05/2023   8:08 AM EDT To: Gna-Pod 2 Results  Kindly inform the patient that carotid ultrasound shows mild less than 50% narrowing of the left carotid stent.  No significant narrowing on the right

## 2023-02-10 ENCOUNTER — Ambulatory Visit (INDEPENDENT_AMBULATORY_CARE_PROVIDER_SITE_OTHER): Payer: Medicare Other

## 2023-02-10 DIAGNOSIS — I255 Ischemic cardiomyopathy: Secondary | ICD-10-CM | POA: Diagnosis not present

## 2023-02-11 LAB — CUP PACEART REMOTE DEVICE CHECK
Battery Voltage: 3.11 V
Brady Statistic RV Percent Paced: 0 %
Date Time Interrogation Session: 20240508083929
HighPow Impedance: 66 Ohm
Implantable Lead Connection Status: 753985
Implantable Lead Implant Date: 20220810
Implantable Lead Location: 753860
Implantable Lead Model: 436909
Implantable Lead Serial Number: 81459128
Implantable Pulse Generator Implant Date: 20220810
Lead Channel Impedance Value: 482 Ohm
Lead Channel Pacing Threshold Amplitude: 0.8 V
Lead Channel Sensing Intrinsic Amplitude: 18.5 mV
Lead Channel Setting Pacing Amplitude: 3 V
Lead Channel Setting Pacing Pulse Width: 0.4 ms
Lead Channel Setting Sensing Sensitivity: 0.8 mV
Pulse Gen Model: 429525
Pulse Gen Serial Number: 84855891
Zone Setting Status: 755011

## 2023-02-23 NOTE — Progress Notes (Unsigned)
Cardiology Office Note Date:  02/24/2023  Patient ID:  Garrett Hunter, Garrett Hunter [birth year], MRN [id number] PCP:  Kandyce Rud, MD  Cardiologist:  Lorine Bears, MD Electrophysiologist: Lanier Prude, MD    Chief Complaint: 6 mon ICD follow-up  History of Present Illness: Garrett Hunter is a 77 y.o. male with PMH notable for ICM, HFmrEF, VT s/p ICD, HTN, acquired hypothyroid d/t amiodarone, CVA; seen today for Lanier Prude, MD for routine electrophysiology followup.  Last saw Dr. Lalla Brothers 07/2022, was doing well on amiodarone and mexitil.  Today, he tells me that he continues to do well. His wife worries about his activity level and "doesn't let me do much." He is able to walk around grocery store easily. Performs household chores easily. Denies SOB, CP, palpitations. Denies dizziness, presyncope or syncope Denies edema, good appetite.   He checks BP several times a week. Most systolics readings 130-140, very rarely 150-160s    Device Information: Bio single chamber ICD, imp 05/2021; dx VT, secondary prevention - H/o appropriate ATP for VT   AAD History: Amiodarone Mexiletine   Past Medical History:  Diagnosis Date   AAA (abdominal aortic aneurysm) (HCC)    thoracoabdominal aortic aneursym, s/p right ilio-femoral bypass 10/12/16; 3V FEVAR with SMA, RRA, and LRA stenting 11/16/16 by Dr. 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 01/2021 Echo: EF 45-50%, mild LVH, GrII DD, basal inf AK, nl RV fxn, mildly dil LA, triv MR. Ao root 39mm.   Coronary artery disease    a. 1996 Inf MI: TPA; b. 2011 Cath: CTO pRCA/pLCX; c. 07/2012 NSTEMI/Cath (HI): plaque rupture in pRamius (stenting); d. 01/2021 Cath (in setting of VT): LM nl, LAD min irregs, RI patent stent, LCX 100p, OM2 fills via collats from D1, LPL1 - collats  from dLAD, RCA 100p, RPDA - collats from Sept1/2.   Dilated aortic root (HCC)    a. 01/2021 Echo: Ao root 39mm.   Gastric ulcer    a. 07/2018 s/p cauterization.   Hepatitis A    teenager, no longer a problem   Hyperlipidemia    Intolerance to statins.   Hypertension    Left carotid stenosis    a. 02/2021 s/p9 x 40 Enroute L carotid stent.   MI (myocardial infarction) (HCC)    x 3 - last one 2013   Renal artery stenosis (HCC)    a. 07/2018 s/p bilateral renal artery angioplasty.   Stroke [facility]) 01/2021   Ventricular tachycardia (HCC)    a. 01/2021 s/p DCCV; b. 05/2021 s/p Biotronik Acticor 7 VR-T DX AICD (ser# [id number]).    Past Surgical History:  Procedure Laterality Date   ABDOMINAL AORTIC ANEURYSM REPAIR     11/16/2016   ANGIOPLASTY     CARDIAC CATHETERIZATION  2011   CARDIAC CATHETERIZATION  2/14   [facility]; 95% lesion in the ramus intermedius.    CATARACT EXTRACTION W/PHACO Left 12/19/2018   Procedure: CATARACT EXTRACTION PHACO AND INTRAOCULAR LENS PLACEMENT (IOC)  LEFT;  Surgeon: Nevada Crane, MD;  Location: [facility]GERY [facility];  Service: Ophthalmology;  Laterality: Left;   CATARACT EXTRACTION W/PHACO Right 03/06/2019   Procedure: CATARACT EXTRACTION PHACO AND INTRAOCULAR LENS PLACEMENT (IOC)RIGHT;  Surgeon: Nevada Crane, MD;  Location: [facility] SURGERY [facility];  Service: Ophthalmology;  Laterality: Right;  COLONOSCOPY WITH PROPOFOL N/A 03/20/2021   Procedure: COLONOSCOPY WITH PROPOFOL;  Surgeon: Toney Reil, MD;  Location: [facility] ENDOSCOPY;  Service: Gastroenterology;  Laterality: N/A;   CORONARY ANGIOPLASTY  1996   [facility] x1   CORONARY ANGIOPLASTY  2/14   Severe restenosis in the ostial ramus. Status post angioplasty and drug-eluting stent placement with a 2.5 x 18 mm Xience drug-eluting stent   ESOPHAGOGASTRODUODENOSCOPY N/A 08/03/2018   Procedure: ESOPHAGOGASTRODUODENOSCOPY (EGD);  Surgeon: Toney Reil, MD;  Location: [facility] ENDOSCOPY;  Service: Gastroenterology;   Laterality: N/A;   ESOPHAGOGASTRODUODENOSCOPY (EGD) WITH PROPOFOL N/A 11/03/2018   Procedure: ESOPHAGOGASTRODUODENOSCOPY (EGD) WITH PROPOFOL;  Surgeon: Toney Reil, MD;  Location: [facility] ENDOSCOPY;  Service: Gastroenterology;  Laterality: N/A;   ESOPHAGOGASTRODUODENOSCOPY (EGD) WITH PROPOFOL N/A 12/06/2018   Procedure: ESOPHAGOGASTRODUODENOSCOPY (EGD) WITH PROPOFOL;  Surgeon: Midge Minium, MD;  Location: [facility] ENDOSCOPY;  Service: Endoscopy;  Laterality: N/A;   ESOPHAGOGASTRODUODENOSCOPY (EGD) WITH PROPOFOL N/A 03/19/2021   Procedure: ESOPHAGOGASTRODUODENOSCOPY (EGD) WITH PROPOFOL;  Surgeon: Toney Reil, MD;  Location: [facility] ENDOSCOPY;  Service: Gastroenterology;  Laterality: N/A;   ICD IMPLANT N/A 05/14/2021   Procedure: ICD IMPLANT;  Surgeon: Lanier Prude, MD;  Location: [facility] INVASIVE CV LAB;  Service: Cardiovascular;  Laterality: N/A;   IR CT HEAD LTD  01/30/2021   IR PERCUTANEOUS ART THROMBECTOMY/INFUSION INTRACRANIAL INC DIAG ANGIO  01/30/2021   LEFT HEART CATH AND CORONARY ANGIOGRAPHY N/A 01/28/2021   Procedure: LEFT HEART CATH AND CORONARY ANGIOGRAPHY;  Surgeon: Yvonne Kendall, MD;  Location: [facility] INVASIVE CV LAB;  Service: Cardiovascular;  Laterality: N/A;   RADIOLOGY WITH ANESTHESIA N/A 01/30/2021   Procedure: IR WITH ANESTHESIA;  Surgeon: Julieanne Cotton, MD;  Location: [facility] OR;  Service: Radiology;  Laterality: N/A;   TONSILLECTOMY AND ADENOIDECTOMY     TRANSCAROTID ARTERY REVASCULARIZATION  Left 02/07/2021   Procedure: TRANSCAROTID ARTERY REVASCULARIZATION LEFT;  Surgeon: Nada Libman, MD;  Location: [facility] OR;  Service: Vascular;  Laterality: Left;    Current Outpatient Medications  Medication Instructions   amiodarone (PACERONE) 200 mg, Oral, 2 times daily   ascorbic acid (VITAMIN C) 500 mg, Oral, Daily   carvedilol (COREG) 12.5 mg, Oral, 2 times daily   clopidogrel (PLAVIX) 75 mg, Oral, Daily   Coenzyme Q10 (COQ10) 100 MG CAPS 1 capsule, Oral, Every evening    Farxiga 10 mg, Oral, Daily   levothyroxine (SYNTHROID) 75 mcg, Oral, Daily before breakfast   mexiletine (MEXITIL) 150 mg, Oral, 2 times daily   rosuvastatin (CRESTOR) 5 mg, Oral, Daily   sacubitril-valsartan (ENTRESTO) 49-51 MG 1 tablet, Oral, 2 times daily    Social History:  The patient  reports that he quit smoking about 20 years ago. His smoking use included cigarettes. He has a 80.00 pack-year smoking history. He has never used smokeless tobacco. He reports current alcohol use of about 3.0 standard drinks of alcohol per week. He reports that he does not use drugs.   Family History:   The patient's family history includes Cancer (age of onset: 13) in his mother; Heart attack in his father; Hypertension in his brother and sister.  ROS:  Please see the history of present illness. All other systems are reviewed and otherwise negative.   PHYSICAL EXAM:  VS:  BP 134/70 (BP Location: Left Arm, Patient Position: Sitting, Cuff Size: Normal)   Pulse (!) 50   Ht 5\' 11"  (1.803 m)   Wt 183 lb (83 kg)   SpO2 97%   BMI 25.52 kg/m  BMI:  Body mass index is 25.52 kg/m.  GEN- The patient is well appearing, alert and oriented x 3 today.   Lungs- Clear to ausculation bilaterally, normal work of breathing.  Heart- Regular bradycardic rate and rhythm, no murmurs, rubs or gallops Extremities- No peripheral edema, warm, dry Skin-   device pocket well-healed, no tethering   Device interrogation done today and reviewed by myself:  Battery good Lead thresholds, impedence, sensing stable  One NSVT episode since last being seen in clinic, no ATP, resolved spontaneously No changes made today  EKG is ordered. Personal review of EKG from today shows:  SB, rate 50bpm; 1st deg AV block, incomplete LBBB  QT - QTc (frid) -  Recent Labs: 07/15/2022: ALT 62; BUN 19; Creatinine, Ser 1.16; Potassium 4.0; Sodium 143; TSH 15.112  No results found for requested labs within last 365 days.   CrCl  cannot be calculated (Patient's most recent lab result is older than the maximum 21 days allowed.).   Wt Readings from Last 3 Encounters:  02/24/23 183 lb (83 kg)  12/21/22 185 lb (83.9 kg)  09/15/22 184 lb 3.2 oz (83.6 kg)     Additional studies reviewed include: Previous EP, cardiology notes.   LHC, 01/28/2021 Conclusions: Severe two-vessel coronary artery disease with chronic total occlusions of the proximal LCx and RCA.  There are minimal luminal irregularities in the LAD without significant stenosis. Widely patent ramus intermedius stent. Moderately elevated left ventricular filling pressure. Recommendations: Continue IV amiodarone and ICU monitoring in the setting of sustained ventricular tachycardia that is likely scar mediated. Gentle diuresis. Aggressive secondary prevention of coronary artery disease. EP consultation for further management of VT.  TTE, 01/28/2021  1. Left ventricular ejection fraction, by estimation, is 45 to 50%. The left ventricle has mildly decreased function. The left ventricle demonstrates global hypokinesis. There is mild left ventricular hypertrophy. Left ventricular diastolic parameters are consistent with Grade II diastolic dysfunction (pseudonormalization). There is akinesis of the left ventricular, basal inferior segment.   2. Right ventricular systolic function is normal. The right ventricular size is normal.   3. Left atrial size was mildly dilated.   4. The mitral valve is grossly normal. Trivial mitral valve regurgitation. No evidence of mitral stenosis.   5. The aortic valve was not well visualized. Aortic valve regurgitation is not visualized. No aortic stenosis is present.   6. Aortic dilatation noted. There is mild dilatation of the aortic root, measuring 39 mm.    ASSESSMENT AND PLAN:  #) VT One recent NSVT episode, resolved spontaneously No recent ATP therapies QT unchanged from prior EKG Tolerating amiodarone 200mg  BID and  mexilitine Labs recently updated with PCP   #) HFmrEF #) HTN NYHA II symptoms Warm and dry on exam GDMT: coreg, farxiga, entresto Will increase entresto to 49-51 - update BMP 7-10d after dose increase   #) Acquired Hypothyroid TSH remains elevated on 01/2023 labwork, PCP increased synthroid Appreciate PCP's assistance in mgmt Has PCP appt in next few months   Current medicines are reviewed at length with the patient today.   The patient does not have concerns regarding his medicines.  The following changes were made today:   INCREAE entresto to 49-51 dose  Labs/ tests ordered today include:  Orders Placed This Encounter  Procedures   Basic Metabolic Panel (BMET)   EKG 12-Lead     Disposition: Follow up with Dr. Lalla Brothers or EP APP in 6 months   Signed, Sherie Don, NP  02/24/23  2:22 PM  Electrophysiology [facility]

## 2023-02-24 ENCOUNTER — Encounter: Payer: Self-pay | Admitting: Cardiology

## 2023-02-24 ENCOUNTER — Ambulatory Visit: Payer: Medicare Other | Attending: Cardiology | Admitting: Cardiology

## 2023-02-24 VITALS — BP 134/70 | HR 50 | Ht 71.0 in | Wt 183.0 lb

## 2023-02-24 DIAGNOSIS — Z95810 Presence of automatic (implantable) cardiac defibrillator: Secondary | ICD-10-CM | POA: Diagnosis present

## 2023-02-24 DIAGNOSIS — I472 Ventricular tachycardia, unspecified: Secondary | ICD-10-CM

## 2023-02-24 DIAGNOSIS — I10 Essential (primary) hypertension: Secondary | ICD-10-CM | POA: Diagnosis present

## 2023-02-24 DIAGNOSIS — I5022 Chronic systolic (congestive) heart failure: Secondary | ICD-10-CM

## 2023-02-24 DIAGNOSIS — Z79899 Other long term (current) drug therapy: Secondary | ICD-10-CM

## 2023-02-24 LAB — CUP PACEART INCLINIC DEVICE CHECK
Date Time Interrogation Session: 20240522144016
Implantable Lead Connection Status: 753985
Implantable Lead Implant Date: 20220810
Implantable Lead Location: 753860
Implantable Lead Model: 436909
Implantable Lead Serial Number: 81459128
Implantable Pulse Generator Implant Date: 20220810
Pulse Gen Model: 429525
Pulse Gen Serial Number: 84855891

## 2023-02-24 MED ORDER — ENTRESTO 49-51 MG PO TABS: 1.0000 | ORAL_TABLET | Freq: Two times a day (BID) | ORAL | 1 refills | Status: DC

## 2023-02-24 NOTE — Patient Instructions (Signed)
Medication Instructions:  STOP Entresto 24-26 mg. START Entresto 49-51 mg tablets by mouth twice a day  *If you need a refill on your cardiac medications before your next appointment, please call your pharmacy*   Lab Work: BMP to be drawn in 7-10 days - Please go to the [facility]. You will check in at the front desk to the right as you walk into the atrium. Valet Parking is offered if needed. - No appointment needed. You may go any day between 7 am and 6 pm.  If you have labs (blood work) drawn today and your tests are completely normal, you will receive your results only by: MyChart Message (if you have MyChart) OR A paper copy in the mail If you have any lab test that is abnormal or we need to change your treatment, we will call you to review the results.   Testing/Procedures: No testing ordered   Follow-Up: At [facility], you and your health needs are our priority.  As part of our continuing mission to provide you with exceptional heart care, we have created designated Provider Care Teams.  These Care Teams include your primary Cardiologist (physician) and Advanced Practice Providers (APPs -  Physician Assistants and Nurse Practitioners) who all work together to provide you with the care you need, when you need it.  We recommend signing up for the patient portal called "MyChart".  Sign up information is provided on this After Visit Summary.  MyChart is used to connect with patients for Virtual Visits (Telemedicine).  Patients are able to view lab/test results, encounter notes, upcoming appointments, etc.  Non-urgent messages can be sent to your provider as well.   To learn more about what you can do with MyChart, go to [email or web address].    Your next appointment:   6 month(s)  Provider:   Steffanie Dunn, MD or Sherie Don, NP

## 2023-03-03 NOTE — Progress Notes (Signed)
Remote ICD transmission.   

## 2023-03-04 ENCOUNTER — Other Ambulatory Visit
Admission: RE | Admit: 2023-03-04 | Discharge: 2023-03-04 | Disposition: A | Discharge status: Home / Self Care | Payer: Medicare Other | Attending: Cardiology | Admitting: Cardiology

## 2023-03-04 DIAGNOSIS — I5022 Chronic systolic (congestive) heart failure: Secondary | ICD-10-CM | POA: Insufficient documentation

## 2023-03-04 DIAGNOSIS — I472 Ventricular tachycardia, unspecified: Secondary | ICD-10-CM | POA: Insufficient documentation

## 2023-03-04 DIAGNOSIS — I10 Essential (primary) hypertension: Secondary | ICD-10-CM | POA: Insufficient documentation

## 2023-03-04 DIAGNOSIS — Z79899 Other long term (current) drug therapy: Secondary | ICD-10-CM | POA: Insufficient documentation

## 2023-03-04 LAB — BASIC METABOLIC PANEL
Anion gap: 8 (ref 5–15)
BUN: 24 mg/dL — ABNORMAL HIGH (ref 8–23)
CO2: 28 mmol/L (ref 22–32)
Calcium: 8.9 mg/dL (ref 8.9–10.3)
Chloride: 106 mmol/L (ref 98–111)
Creatinine, Ser: 1.15 mg/dL (ref 0.61–1.24)
GFR, Estimated: >60 mL/min (ref >60)
Glucose, Bld: 73 mg/dL (ref 70–99)
Potassium: 3.5 mmol/L (ref 3.5–5.1)
Sodium: 142 mmol/L (ref 135–145)

## 2023-03-19 ENCOUNTER — Encounter: Payer: Self-pay | Admitting: Cardiovascular Disease

## 2023-03-19 ENCOUNTER — Ambulatory Visit: Payer: Medicare Other | Attending: Cardiovascular Disease | Admitting: Cardiovascular Disease

## 2023-03-19 VITALS — BP 118/70 | HR 55 | Ht 71.0 in | Wt 180.4 lb

## 2023-03-19 DIAGNOSIS — E785 Hyperlipidemia, unspecified: Secondary | ICD-10-CM | POA: Diagnosis present

## 2023-03-19 DIAGNOSIS — I5022 Chronic systolic (congestive) heart failure: Secondary | ICD-10-CM

## 2023-03-19 DIAGNOSIS — I2510 Atherosclerotic heart disease of native coronary artery without angina pectoris: Secondary | ICD-10-CM | POA: Diagnosis present

## 2023-03-19 DIAGNOSIS — I714 Abdominal aortic aneurysm, without rupture, unspecified: Secondary | ICD-10-CM | POA: Insufficient documentation

## 2023-03-19 DIAGNOSIS — I779 Disorder of arteries and arterioles, unspecified: Secondary | ICD-10-CM | POA: Diagnosis present

## 2023-03-19 DIAGNOSIS — I10 Essential (primary) hypertension: Secondary | ICD-10-CM | POA: Diagnosis present

## 2023-03-19 DIAGNOSIS — I472 Ventricular tachycardia, unspecified: Secondary | ICD-10-CM | POA: Diagnosis present

## 2023-03-19 NOTE — Patient Instructions (Signed)
Medication Instructions:  No changes *If you need a refill on your cardiac medications before your next appointment, please call your pharmacy*   Lab Work: None ordered If you have labs (blood work) drawn today and your tests are completely normal, you will receive your results only by: MyChart Message (if you have MyChart) OR A paper copy in the mail If you have any lab test that is abnormal or we need to change your treatment, we will call you to review the results.   Testing/Procedures: None ordered   Follow-Up: At [facility], you and your health needs are our priority.  As part of our continuing mission to provide you with exceptional heart care, we have created designated Provider Care Teams.  These Care Teams include your primary Cardiologist (physician) and Advanced Practice Providers (APPs -  Physician Assistants and Nurse Practitioners) who all work together to provide you with the care you need, when you need it.  We recommend signing up for the patient portal called "MyChart".  Sign up information is provided on this After Visit Summary.  MyChart is used to connect with patients for Virtual Visits (Telemedicine).  Patients are able to view lab/test results, encounter notes, upcoming appointments, etc.  Non-urgent messages can be sent to your provider as well.   To learn more about what you can do with MyChart, go to [email or web address].    Your next appointment:   9 month(s)  Provider:   You may see Muhammad Arida, MD or one of the following Advanced Practice Providers on your designated Care Team:   Christopher Berge, NP Ryan Dunn, PA-C Cadence Furth, PA-C Sheri Hammock, NP    

## 2023-03-19 NOTE — Progress Notes (Signed)
Cardiology Office Note   Date:  03/19/2023   ID:  Garrett Hunter, DOB 10/05/1946, MRN [id number]  PCP:  Kandyce Rud, MD  Cardiologist:   Lorine Bears, MD   Chief Complaint  Patient presents with   Follow-up    6 month f/u no complaints today. Meds reviewed verbally with pt.      History of Present Illness: Garrett Hunter is a 77 y.o. male who presents for a followup visit regarding coronary artery disease, chronic systolic heart failure and ventricular tachycardia.   He has known history of coronary artery disease status post inferior myocardial infarction in 1996. He was treated with TPA at that time .  He is known to have chronically occluded right coronary artery and small left circumflex with good collaterals. He had ostial ramus artery drug-eluting stent placement in February 2014.   He had endovascular repair of abdominal aortic aneurysm in February 2018 by Dr. Pattricia Boss at [facility].   He was hospitalized in October 2019 with upper GI bleed with melena.  He had an EGD done which showed a gastric ulcer that was cauterized.  His Plavix was discontinued at that time.  He was hospitalized in April 2022 with sustained ventricular tachycardia which required cardioversion.  He was placed on amiodarone.  Echocardiogram showed an EF of 45 to 50%.  Cardiac catheterization showed stable coronary anatomy with patent ramus stent.  During that admission, he suffered a left MCA stroke and received tPA.  He was found to have a 75% stenosis in the left internal carotid artery and subsequently underwent left carotid stenting he was readmitted in June 2022 with GI bleed in the setting of dual antiplatelet therapy.  He had ventricular tachycardia and mexiletine was added to amiodarone.  He also had an ICD placement.  He lost significant weight after his stroke.  He has been doing well with no chest pain, shortness of breath or palpitations.  Past Medical History:  Diagnosis Date   AAA  (abdominal aortic aneurysm) (HCC)    thoracoabdominal aortic aneursym, s/p right ilio-femoral bypass 10/12/16; 3V FEVAR with SMA, RRA, and LRA stenting 11/16/16 by Dr. Pattricia Boss [facility])   Benign neoplasm of colon    patient not sure   Chronic airway obstruction, not elsewhere classified    pt denies   Chronic HFrrEF (heart failure with midrange ejection fraction) (HCC)    a. 01/2021 Echo: EF 45-50%, mild LVH, GrII DD, basal inf AK, nl RV fxn, mildly dil LA, triv MR. Ao root 39mm.   Coronary artery disease    a. 1996 Inf MI: TPA; b. 2011 Cath: CTO pRCA/pLCX; c. 07/2012 NSTEMI/Cath (HI): plaque rupture in pRamius (stenting); d. 01/2021 Cath (in setting of VT): LM nl, LAD min irregs, RI patent stent, LCX 100p, OM2 fills via collats from D1, LPL1 - collats from dLAD, RCA 100p, RPDA - collats from Sept1/2.   Dilated aortic root (HCC)    a. 01/2021 Echo: Ao root 39mm.   Gastric ulcer    a. 07/2018 s/p cauterization.   Hepatitis A    teenager, no longer a problem   Hyperlipidemia    Intolerance to statins.   Hypertension    Left carotid stenosis    a. 02/2021 s/p9 x 40 Enroute L carotid stent.   MI (myocardial infarction) (HCC)    x 3 - last one 2013   Renal artery stenosis (HCC)    a. 07/2018 s/p bilateral renal artery angioplasty.   Stroke [facility]) 01/2021  Ventricular tachycardia (HCC)    a. 01/2021 s/p DCCV; b. 05/2021 s/p Biotronik Acticor 7 VR-T DX AICD (ser# [id number]).    Past Surgical History:  Procedure Laterality Date   ABDOMINAL AORTIC ANEURYSM REPAIR     11/16/2016   ANGIOPLASTY     CARDIAC CATHETERIZATION  2011   CARDIAC CATHETERIZATION  2/14   [facility]; 95% lesion in the ramus intermedius.    CATARACT EXTRACTION W/PHACO Left 12/19/2018   Procedure: CATARACT EXTRACTION PHACO AND INTRAOCULAR LENS PLACEMENT (IOC)  LEFT;  Surgeon: Nevada Crane, MD;  Location: [facility]ERY [facility];  Service: Ophthalmology;  Laterality: Left;   CATARACT EXTRACTION W/PHACO Right 03/06/2019   Procedure: CATARACT  EXTRACTION PHACO AND INTRAOCULAR LENS PLACEMENT (IOC)RIGHT;  Surgeon: Nevada Crane, MD;  Location: [facility]ERY [facility];  Service: Ophthalmology;  Laterality: Right;   COLONOSCOPY WITH PROPOFOL N/A 03/20/2021   Procedure: COLONOSCOPY WITH PROPOFOL;  Surgeon: Toney Reil, MD;  Location: [facility] ENDOSCOPY;  Service: Gastroenterology;  Laterality: N/A;   CORONARY ANGIOPLASTY  1996   [facility] x1   CORONARY ANGIOPLASTY  2/14   Severe restenosis in the ostial ramus. Status post angioplasty and drug-eluting stent placement with a 2.5 x 18 mm Xience drug-eluting stent   ESOPHAGOGASTRODUODENOSCOPY N/A 08/03/2018   Procedure: ESOPHAGOGASTRODUODENOSCOPY (EGD);  Surgeon: Toney Reil, MD;  Location: [facility] ENDOSCOPY;  Service: Gastroenterology;  Laterality: N/A;   ESOPHAGOGASTRODUODENOSCOPY (EGD) WITH PROPOFOL N/A 11/03/2018   Procedure: ESOPHAGOGASTRODUODENOSCOPY (EGD) WITH PROPOFOL;  Surgeon: Toney Reil, MD;  Location: [facility] ENDOSCOPY;  Service: Gastroenterology;  Laterality: N/A;   ESOPHAGOGASTRODUODENOSCOPY (EGD) WITH PROPOFOL N/A 12/06/2018   Procedure: ESOPHAGOGASTRODUODENOSCOPY (EGD) WITH PROPOFOL;  Surgeon: Midge Minium, MD;  Location: [facility] ENDOSCOPY;  Service: Endoscopy;  Laterality: N/A;   ESOPHAGOGASTRODUODENOSCOPY (EGD) WITH PROPOFOL N/A 03/19/2021   Procedure: ESOPHAGOGASTRODUODENOSCOPY (EGD) WITH PROPOFOL;  Surgeon: Toney Reil, MD;  Location: [facility] ENDOSCOPY;  Service: Gastroenterology;  Laterality: N/A;   ICD IMPLANT N/A 05/14/2021   Procedure: ICD IMPLANT;  Surgeon: Lanier Prude, MD;  Location: [facility] INVASIVE CV LAB;  Service: Cardiovascular;  Laterality: N/A;   IR CT HEAD LTD  01/30/2021   IR PERCUTANEOUS ART THROMBECTOMY/INFUSION INTRACRANIAL INC DIAG ANGIO  01/30/2021   LEFT HEART CATH AND CORONARY ANGIOGRAPHY N/A 01/28/2021   Procedure: LEFT HEART CATH AND CORONARY ANGIOGRAPHY;  Surgeon: Yvonne Kendall, MD;  Location: [facility] INVASIVE CV LAB;  Service:  Cardiovascular;  Laterality: N/A;   RADIOLOGY WITH ANESTHESIA N/A 01/30/2021   Procedure: IR WITH ANESTHESIA;  Surgeon: Julieanne Cotton, MD;  Location: [facility] OR;  Service: Radiology;  Laterality: N/A;   TONSILLECTOMY AND ADENOIDECTOMY     TRANSCAROTID ARTERY REVASCULARIZATION  Left 02/07/2021   Procedure: TRANSCAROTID ARTERY REVASCULARIZATION LEFT;  Surgeon: Nada Libman, MD;  Location: [facility] OR;  Service: Vascular;  Laterality: Left;     Current Outpatient Medications  Medication Sig Dispense Refill   amiodarone (PACERONE) 200 MG tablet Take 1 tablet (200 mg total) by mouth 2 (two) times daily. 180 tablet 3   carvedilol (COREG) 12.5 MG tablet TAKE 1 TABLET TWICE A DAY 180 tablet 1   clopidogrel (PLAVIX) 75 MG tablet Take 75 mg by mouth daily.     Coenzyme Q10 (COQ10) 100 MG CAPS Take 1 capsule by mouth every evening.     FARXIGA 10 MG TABS tablet TAKE 1 TABLET BY MOUTH EVERY DAY 90 tablet 3   levothyroxine (SYNTHROID) 75 MCG tablet Take 75 mcg by mouth daily before breakfast.     mexiletine (  MEXITIL) 150 MG capsule TAKE 1 CAPSULE BY MOUTH TWICE A DAY 180 capsule 0   rosuvastatin (CRESTOR) 5 MG tablet TAKE 1 TABLET DAILY 90 tablet 1   sacubitril-valsartan (ENTRESTO) 49-51 MG Take 1 tablet by mouth 2 (two) times daily. 180 tablet 1   vitamin C (ASCORBIC ACID) 500 MG tablet Take 500 mg by mouth daily.     No current facility-administered medications for this visit.    Allergies:   Crestor [rosuvastatin], Livalo [pitavastatin], Pravachol [pravastatin sodium], and Zocor [simvastatin]    Social History:  The patient  reports that he quit smoking about 20 years ago. His smoking use included cigarettes. He has a 80.00 pack-year smoking history. He has never used smokeless tobacco. He reports current alcohol use of about 3.0 standard drinks of alcohol per week. He reports that he does not use drugs.   Family History:  The patient's family history includes Cancer (age of onset: 67) in his mother;  Heart attack in his father; Hypertension in his brother and sister.    ROS:  Please see the history of present illness.   Otherwise, review of systems are positive for none.   All other systems are reviewed and negative.    PHYSICAL EXAM: VS:  BP 118/70 (BP Location: Left Arm, Patient Position: Sitting, Cuff Size: Normal)   Pulse (!) 55   Ht 5\' 11"  (1.803 m)   Wt 180 lb 6 oz (81.8 kg)   SpO2 95%   BMI 25.16 kg/m  , BMI Body mass index is 25.16 kg/m. GEN: Well nourished, well developed, in no acute distress  HEENT: normal  Neck: no JVD, carotid bruits, or masses Cardiac: RRR; no murmurs, rubs, or gallops,no edema  Respiratory:  clear to auscultation bilaterally, normal work of breathing GI: soft, nontender, nondistended, + BS MS: no deformity or atrophy  Skin: warm and dry, no rash Neuro:  Strength and sensation are intact Psych: euthymic mood, full affect Radial pulses normal bilaterally.  Ulnar pulses slightly diminished bilaterally.   EKG:  EKG is ordered today. The ekg ordered today demonstrates sinus bradycardia with first-degree AV block and nonspecific IVCD with left bundle branch block morphology.  Recent Labs: 07/15/2022: ALT 62; TSH 15.112 03/04/2023: BUN 24; Creatinine, Ser 1.15; Potassium 3.5; Sodium 142    Lipid Panel    Component Value Date/Time   CHOL 119 01/30/2021 0530   CHOL 121 09/13/2012 0819   TRIG 85 01/30/2021 0530   HDL 38 (L) 01/30/2021 0530   HDL 37 (L) 09/13/2012 0819   CHOLHDL 3.1 01/30/2021 0530   VLDL 17 01/30/2021 0530   LDLCALC 64 01/30/2021 0530   LDLCALC 68 09/13/2012 0819      Wt Readings from Last 3 Encounters:  03/19/23 180 lb 6 oz (81.8 kg)  02/24/23 183 lb (83 kg)  12/21/22 185 lb (83.9 kg)        ASSESSMENT AND PLAN:  1.  Coronary artery disease involving native coronary arteries without angina: He is doing well with no anginal symptoms.  Continue Plavix without aspirin.  He does complain of extensive bruising but I  explained to him that Plavix is essential for him considering his previous stroke, carotid stent and previous cardiac stent.  2. Chronic systolic heart failure: Mildly reduced ejection fraction due to ischemic cardiomyopathy.  Currently [place] heart association class II.  He is on optimal medical therapy.  He appears to be euvolemic without a diuretic.    3. Abdominal aortic aneurysm:  Status  post  endovascular repair. Followed at [facility] by Dr. Pattricia Boss.   4. Hyperlipidemia: He has known intolerance to statins but has been able to tolerate small dose rosuvastatin.  I reviewed his most recent lipid profile which showed an LDL of 50 which is at target.  5. Essential hypertension: Blood pressure is under excellent control.  6.  Sustained ventricular tachycardia: Status post ICD placement.  Currently controlled with amiodarone and mexiletine. Most recent labs that show elevated TSH likely due to amiodarone and his Synthroid dose was increased.  7.  Carotid artery disease status post left ICA stent.  He is on Plavix.   Disposition:   FU with me in 9 months.   Signed,  Lorine Bears, MD  03/19/2023 9:11 AM    [facility]

## 2023-04-21 ENCOUNTER — Other Ambulatory Visit: Payer: Self-pay

## 2023-04-21 MED ORDER — ROSUVASTATIN CALCIUM 5 MG PO TABS: 5.0000 mg | ORAL_TABLET | Freq: Every day | ORAL | 1 refills | Status: DC

## 2023-04-29 ENCOUNTER — Other Ambulatory Visit: Payer: Self-pay | Admitting: Cardiovascular Disease

## 2023-05-12 ENCOUNTER — Ambulatory Visit (INDEPENDENT_AMBULATORY_CARE_PROVIDER_SITE_OTHER): Payer: Medicare Other

## 2023-05-12 DIAGNOSIS — I255 Ischemic cardiomyopathy: Secondary | ICD-10-CM | POA: Diagnosis not present

## 2023-05-28 NOTE — Progress Notes (Signed)
Remote ICD transmission.   

## 2023-08-11 ENCOUNTER — Ambulatory Visit (INDEPENDENT_AMBULATORY_CARE_PROVIDER_SITE_OTHER): Payer: Medicare Other

## 2023-08-11 DIAGNOSIS — I255 Ischemic cardiomyopathy: Secondary | ICD-10-CM

## 2023-08-11 LAB — CUP PACEART REMOTE DEVICE CHECK
Date Time Interrogation Session: 20241106125411
Implantable Lead Connection Status: 753985
Implantable Lead Implant Date: 20220810
Implantable Lead Location: 753860
Implantable Lead Model: 436909
Implantable Lead Serial Number: 81459128
Implantable Pulse Generator Implant Date: 20220810
Pulse Gen Model: 429525
Pulse Gen Serial Number: 84855891

## 2023-08-26 ENCOUNTER — Other Ambulatory Visit: Payer: Self-pay | Admitting: Cardiology

## 2023-08-31 NOTE — Progress Notes (Signed)
Remote ICD transmission.   

## 2023-09-24 ENCOUNTER — Other Ambulatory Visit: Payer: Self-pay | Admitting: Cardiology

## 2023-10-12 ENCOUNTER — Other Ambulatory Visit: Payer: Self-pay | Admitting: Nurse Practitioner

## 2023-10-12 ENCOUNTER — Other Ambulatory Visit: Payer: Self-pay | Admitting: Cardiology

## 2023-10-12 MED ORDER — ROSUVASTATIN CALCIUM 5 MG PO TABS: 5.0000 mg | ORAL_TABLET | Freq: Every day | ORAL | 0 refills | Status: DC

## 2023-10-12 NOTE — Telephone Encounter (Signed)
 Requested Prescriptions   Signed Prescriptions Disp Refills   carvedilol  (COREG ) 12.5 MG tablet 180 tablet 0    Sig: TAKE 1 TABLET TWICE A DAY    Authorizing Provider: DARRON GRASS A    Ordering User: Brandee Markin L   rosuvastatin  (CRESTOR ) 5 MG tablet 90 tablet 0    Sig: Take 1 tablet (5 mg total) by mouth daily.    Authorizing Provider: DARRON GRASS A    Ordering User: CLAUDENE POWELL CROME   Last office visit: 03/19/23 with plan to f/u in 9 months. next office visit: none/active recall

## 2023-10-23 ENCOUNTER — Other Ambulatory Visit: Payer: Self-pay | Admitting: Cardiovascular Disease

## 2023-11-10 ENCOUNTER — Ambulatory Visit: Payer: Medicare Other

## 2023-11-10 DIAGNOSIS — I255 Ischemic cardiomyopathy: Secondary | ICD-10-CM | POA: Diagnosis not present

## 2023-11-12 LAB — CUP PACEART REMOTE DEVICE CHECK
Date Time Interrogation Session: 20250207082611
Implantable Lead Connection Status: 753985
Implantable Lead Implant Date: 20220810
Implantable Lead Location: 753860
Implantable Lead Model: 436909
Implantable Lead Serial Number: 81459128
Implantable Pulse Generator Implant Date: 20220810
Pulse Gen Model: 429525
Pulse Gen Serial Number: 84855891

## 2023-11-13 ENCOUNTER — Encounter: Payer: Self-pay | Admitting: Cardiology

## 2023-12-17 NOTE — Progress Notes (Signed)
 Remote ICD transmission.

## 2023-12-17 NOTE — Addendum Note (Signed)
 Addended by: Elease Etienne A on: 12/17/2023 01:39 PM   Modules accepted: Orders

## 2024-01-03 ENCOUNTER — Other Ambulatory Visit: Payer: Self-pay

## 2024-01-03 MED ORDER — CARVEDILOL 12.5 MG PO TABS: 12.5000 mg | ORAL_TABLET | Freq: Two times a day (BID) | ORAL | 0 refills | Status: DC

## 2024-01-08 ENCOUNTER — Other Ambulatory Visit: Payer: Self-pay | Admitting: Nurse Practitioner

## 2024-01-10 NOTE — Telephone Encounter (Signed)
 Hi,  Could you schedule this patient a 9 month follow up visit? The patient was last seen by Dr. Kirke Corin on 03-19-2023. Thank you so much.

## 2024-01-10 NOTE — Telephone Encounter (Signed)
LVM to schedule fu appt, please schedule 

## 2024-01-18 ENCOUNTER — Other Ambulatory Visit: Payer: Self-pay

## 2024-01-18 MED ORDER — ROSUVASTATIN CALCIUM 5 MG PO TABS: 5.0000 mg | ORAL_TABLET | Freq: Every day | ORAL | 3 refills | Status: AC

## 2024-01-20 ENCOUNTER — Other Ambulatory Visit: Payer: Self-pay | Admitting: Cardiovascular Disease

## 2024-02-01 NOTE — Progress Notes (Unsigned)
  Electrophysiology Office Follow up Visit Note:    Date:  02/02/2024   ID:  Garrett Hunter, DOB 11/01/1945, MRN [id number]  PCP:  Nestor Banter, MD  [facility] Cardiologist:  Antionette Kirks, MD  [facility] Electrophysiologist:  Boyce Byes, MD    Interval History:     Garrett Hunter is a 78 y.o. male who presents for a follow up visit.  The patient was last seen by Ottie Blonder Feb 24, 2023.  He has a history of ischemic cardiomyopathy, VT, ICD in situ, hypertension, stroke.  At the last appointment he was taking amiodarone  and mexiletine.         Past medical, surgical, social and family history were reviewed.  ROS:   Please see the history of present illness.    All other systems reviewed and are negative.  EKGs/Labs/Other Studies Reviewed:    The following studies were reviewed today:  February 02, 2024 in-clinic device interrogation personally reviewed Battery and lead parameter stable.  No high-voltage therapies.        Physical Exam:    VS:  BP (!) 150/84 (BP Location: Left Arm, Patient Position: Sitting, Cuff Size: Normal)   Pulse (!) 56   Ht 5\' 11"  (1.803 m)   Wt 185 lb 3.2 oz (84 kg)   SpO2 97%   BMI 25.83 kg/m     Wt Readings from Last 3 Encounters:  02/02/24 185 lb 3.2 oz (84 kg)  03/19/23 180 lb 6 oz (81.8 kg)  02/24/23 183 lb (83 kg)     GEN: no distress CARD: RRR, No MRG.  CIED pocket well-healed RESP: No IWOB. CTAB.      ASSESSMENT:    1. Ischemic cardiomyopathy   2. Chronic systolic heart failure (HCC)   3. Sustained VT (ventricular tachycardia) (HCC)   4. ICD (implantable cardioverter-defibrillator) in place    PLAN:    In order of problems listed above:  #Ventricular tachycardia #High risk med monitoring-amiodarone /mexiletine The patient's rhythm has remained quiescent.  Continue amiodarone  and mexiletine at their current doses.  Recent blood work primary care shows stable liver and thyroid   function.  #Chronic systolic heart failure NYHA class II.  Complicated by VT.  Continue Coreg , Entresto , Farxiga .  #ICD in situ Device functioning appropriately.  Continue remote monitoring.  Follow-up 6 months with APP.  Signed, Harvie Liner, MD, [facility], [facility] 02/02/2024 9:23 AM    Electrophysiology [facility]

## 2024-02-02 ENCOUNTER — Encounter: Payer: Self-pay | Admitting: Cardiology

## 2024-02-02 ENCOUNTER — Ambulatory Visit: Attending: Cardiology | Admitting: Cardiology

## 2024-02-02 VITALS — BP 150/84 | HR 56 | Ht 71.0 in | Wt 185.2 lb

## 2024-02-02 DIAGNOSIS — Z95810 Presence of automatic (implantable) cardiac defibrillator: Secondary | ICD-10-CM | POA: Diagnosis not present

## 2024-02-02 DIAGNOSIS — I5022 Chronic systolic (congestive) heart failure: Secondary | ICD-10-CM | POA: Diagnosis not present

## 2024-02-02 DIAGNOSIS — I255 Ischemic cardiomyopathy: Secondary | ICD-10-CM | POA: Diagnosis not present

## 2024-02-02 DIAGNOSIS — I472 Ventricular tachycardia, unspecified: Secondary | ICD-10-CM | POA: Diagnosis not present

## 2024-02-02 LAB — CUP PACEART INCLINIC DEVICE CHECK
Date Time Interrogation Session: 20250430092320
Implantable Lead Connection Status: 753985
Implantable Lead Implant Date: 20220810
Implantable Lead Location: 753860
Implantable Lead Model: 436909
Implantable Lead Serial Number: 81459128
Implantable Pulse Generator Implant Date: 20220810
Pulse Gen Model: 429525
Pulse Gen Serial Number: 84855891

## 2024-02-02 LAB — PACEMAKER DEVICE OBSERVATION

## 2024-02-02 NOTE — Patient Instructions (Signed)
 Medication Instructions:  Your physician recommends that you continue on your current medications as directed. Please refer to the Current Medication list given to you today.  *If you need a refill on your cardiac medications before your next appointment, please call your pharmacy*  Follow-Up: At [facility], you and your health needs are our priority.  As part of our continuing mission to provide you with exceptional heart care, our providers are all part of one team.  This team includes your primary Cardiologist (physician) and Advanced Practice Providers or APPs (Physician Assistants and Nurse Practitioners) who all work together to provide you with the care you need, when you need it.  Your next appointment:   6 months  Provider:   You may see Lanier Prude, MD or one of the following Advanced Practice Providers on your designated Care Team:   Francis Dowse, [place] [address] [place], [place] Sherie Don, NP Canary Brim, NP

## 2024-02-04 ENCOUNTER — Encounter: Payer: Self-pay | Admitting: Cardiology

## 2024-02-09 ENCOUNTER — Ambulatory Visit (INDEPENDENT_AMBULATORY_CARE_PROVIDER_SITE_OTHER): Payer: Medicare Other

## 2024-02-09 DIAGNOSIS — I255 Ischemic cardiomyopathy: Secondary | ICD-10-CM

## 2024-02-09 LAB — CUP PACEART REMOTE DEVICE CHECK
Date Time Interrogation Session: 20250507084846
Implantable Lead Connection Status: 753985
Implantable Lead Implant Date: 20220810
Implantable Lead Location: 753860
Implantable Lead Model: 436909
Implantable Lead Serial Number: 81459128
Implantable Pulse Generator Implant Date: 20220810
Pulse Gen Model: 429525
Pulse Gen Serial Number: 84855891

## 2024-02-10 ENCOUNTER — Encounter: Payer: Self-pay | Admitting: Cardiology

## 2024-02-13 ENCOUNTER — Other Ambulatory Visit: Payer: Self-pay | Admitting: Cardiology

## 2024-02-15 ENCOUNTER — Encounter: Payer: Self-pay | Admitting: Cardiology

## 2024-03-21 NOTE — Progress Notes (Signed)
 Remote ICD transmission.

## 2024-03-28 ENCOUNTER — Other Ambulatory Visit: Payer: Self-pay | Admitting: Cardiology

## 2024-03-30 ENCOUNTER — Telehealth: Payer: Self-pay | Admitting: Cardiology

## 2024-03-30 MED ORDER — FARXIGA 10 MG PO TABS: 10.0000 mg | ORAL_TABLET | Freq: Every day | ORAL | 2 refills | Status: AC

## 2024-03-30 NOTE — Telephone Encounter (Signed)
 Pt's medication was sent to pt's pharmacy as requested. Confirmation received.

## 2024-03-30 NOTE — Telephone Encounter (Signed)
*  STAT* If patient is at the pharmacy, call can be transferred to refill team.   1. Which medications need to be refilled? (please list name of each medication and dose if known) FARXIGA  10 MG TABS tablet      4. Which pharmacy/location (including street and city if local pharmacy) is medication to be sent to? [id number] - [place], [place] - [address]    5. Do they need a 30 day or 90 day supply? 90

## 2024-03-30 NOTE — Telephone Encounter (Signed)
 Garrett Hunter, [place] 03/30/24  3:08 PM Note Pt's medication was sent to pt's pharmacy as requested. Confirmation received.

## 2024-04-09 ENCOUNTER — Other Ambulatory Visit: Payer: Self-pay | Admitting: Cardiovascular Disease

## 2024-04-11 NOTE — Telephone Encounter (Signed)
 Please contact pt for future appointment. Pt overdue for 9 month f/u needing refills.

## 2024-04-13 ENCOUNTER — Encounter: Payer: Self-pay | Admitting: Cardiovascular Disease

## 2024-04-13 ENCOUNTER — Ambulatory Visit: Attending: Cardiovascular Disease | Admitting: Cardiovascular Disease

## 2024-04-13 VITALS — BP 128/64 | HR 53 | Ht 71.0 in | Wt 181.5 lb

## 2024-04-13 DIAGNOSIS — I472 Ventricular tachycardia, unspecified: Secondary | ICD-10-CM | POA: Insufficient documentation

## 2024-04-13 DIAGNOSIS — I2510 Atherosclerotic heart disease of native coronary artery without angina pectoris: Secondary | ICD-10-CM | POA: Diagnosis present

## 2024-04-13 DIAGNOSIS — I5022 Chronic systolic (congestive) heart failure: Secondary | ICD-10-CM | POA: Insufficient documentation

## 2024-04-13 DIAGNOSIS — I10 Essential (primary) hypertension: Secondary | ICD-10-CM | POA: Diagnosis present

## 2024-04-13 DIAGNOSIS — I714 Abdominal aortic aneurysm, without rupture, unspecified: Secondary | ICD-10-CM | POA: Insufficient documentation

## 2024-04-13 DIAGNOSIS — E785 Hyperlipidemia, unspecified: Secondary | ICD-10-CM | POA: Insufficient documentation

## 2024-04-13 NOTE — Progress Notes (Signed)
 Cardiology Office Note   Date:  04/13/2024   ID:  Garrett Hunter, Garrett Hunter 1946/09/20, MRN [id number]  PCP:  Diedra Lame, MD  Cardiologist:   Deatrice Cage, MD   Chief Complaint  Patient presents with   Follow-up    9 month f/u no complaints today. Meds reviewed verbally with pt.      History of Present Illness: Garrett Hunter is a 78 y.o. male who presents for a followup visit regarding coronary artery disease, chronic systolic heart failure and ventricular tachycardia.   He has known history of coronary artery disease status post inferior myocardial infarction in 1996. He was treated with TPA at that time .  He is known to have chronically occluded right coronary artery and small left circumflex with good collaterals. He had ostial ramus artery drug-eluting stent placement in February 2014.   He had endovascular repair of abdominal aortic aneurysm in February 2018 by Dr. Missy at [facility].   He was hospitalized in October 2019 with upper GI bleed with melena.  He had an EGD done which showed a gastric ulcer that was cauterized.  His Plavix  was discontinued at that time.  He was hospitalized in April 2022 with sustained ventricular tachycardia which required cardioversion.  He was placed on amiodarone .  Echocardiogram showed an EF of 45 to 50%.  Cardiac catheterization showed stable coronary anatomy with patent ramus stent.  During that admission, he suffered a left MCA stroke and received tPA.  He was found to have a 75% stenosis in the left internal carotid artery and subsequently underwent left carotid stenting he was readmitted in June 2022 with GI bleed in the setting of dual antiplatelet therapy.  He had ventricular tachycardia and mexiletine was added to amiodarone .  He also had an ICD placement.  He lost significant weight after his stroke.  That has stabilized overall.  He has been doing well with no chest pain, shortness of breath or palpitations.  For the most part,  he recovered from his stroke with the exception of intermittent slurred speech.   Past Medical History:  Diagnosis Date   AAA (abdominal aortic aneurysm) (HCC)    thoracoabdominal aortic aneursym, s/p right ilio-femoral bypass 10/12/16; 3V FEVAR with SMA, RRA, and LRA stenting 11/16/16 by Dr. Missy [facility])   Benign neoplasm of colon    patient not sure   Chronic airway obstruction, not elsewhere classified    pt denies   Chronic HFrrEF (heart failure with midrange ejection fraction) (HCC)    a. 01/2021 Echo: EF 45-50%, mild LVH, GrII DD, basal inf AK, nl RV fxn, mildly dil LA, triv MR. Ao root 39mm.   Coronary artery disease    a. 1996 Inf MI: TPA; b. 2011 Cath: CTO pRCA/pLCX; c. 07/2012 NSTEMI/Cath (HI): plaque rupture in pRamius (stenting); d. 01/2021 Cath (in setting of VT): LM nl, LAD min irregs, RI patent stent, LCX 100p, OM2 fills via collats from D1, LPL1 - collats from dLAD, RCA 100p, RPDA - collats from Sept1/2.   Dilated aortic root (HCC)    a. 01/2021 Echo: Ao root 39mm.   Gastric ulcer    a. 07/2018 s/p cauterization.   Hepatitis A    teenager, no longer a problem   Hyperlipidemia    Intolerance to statins.   Hypertension    Left carotid stenosis    a. 02/2021 s/p9 x 40 Enroute L carotid stent.   MI (myocardial infarction) (HCC)    x 3 - last  one 2013   Renal artery stenosis (HCC)    a. 07/2018 s/p bilateral renal artery angioplasty.   Stroke [facility]) 01/2021   Ventricular tachycardia (HCC)    a. 01/2021 s/p DCCV; b. 05/2021 s/p Biotronik Acticor 7 VR-T DX AICD (ser# [id number]).    Past Surgical History:  Procedure Laterality Date   ABDOMINAL AORTIC ANEURYSM REPAIR     11/16/2016   ANGIOPLASTY     CARDIAC CATHETERIZATION  2011   CARDIAC CATHETERIZATION  2/14   [facility]; 95% lesion in the ramus intermedius.    CATARACT EXTRACTION W/PHACO Left 12/19/2018   Procedure: CATARACT EXTRACTION PHACO AND INTRAOCULAR LENS PLACEMENT (IOC)  LEFT;  Surgeon: Myrna Adine Anes, MD;  Location:  [facility];  Service: Ophthalmology;  Laterality: Left;   CATARACT EXTRACTION W/PHACO Right 03/06/2019   Procedure: CATARACT EXTRACTION PHACO AND INTRAOCULAR LENS PLACEMENT (IOC)RIGHT;  Surgeon: Myrna Adine Anes, MD;  Location: [facility];  Service: Ophthalmology;  Laterality: Right;   COLONOSCOPY WITH PROPOFOL  N/A 03/20/2021   Procedure: COLONOSCOPY WITH PROPOFOL ;  Surgeon: Unk Corinn Skiff, MD;  Location: [facility] ENDOSCOPY;  Service: Gastroenterology;  Laterality: N/A;   CORONARY ANGIOPLASTY  1996   [facility] x1   CORONARY ANGIOPLASTY  2/14   Severe restenosis in the ostial ramus. Status post angioplasty and drug-eluting stent placement with a 2.5 x 18 mm Xience drug-eluting stent   ESOPHAGOGASTRODUODENOSCOPY N/A 08/03/2018   Procedure: ESOPHAGOGASTRODUODENOSCOPY (EGD);  Surgeon: Unk Corinn Skiff, MD;  Location: [facility] ENDOSCOPY;  Service: Gastroenterology;  Laterality: N/A;   ESOPHAGOGASTRODUODENOSCOPY (EGD) WITH PROPOFOL  N/A 11/03/2018   Procedure: ESOPHAGOGASTRODUODENOSCOPY (EGD) WITH PROPOFOL ;  Surgeon: Unk Corinn Skiff, MD;  Location: [facility] ENDOSCOPY;  Service: Gastroenterology;  Laterality: N/A;   ESOPHAGOGASTRODUODENOSCOPY (EGD) WITH PROPOFOL  N/A 12/06/2018   Procedure: ESOPHAGOGASTRODUODENOSCOPY (EGD) WITH PROPOFOL ;  Surgeon: Jinny Carmine, MD;  Location: [facility] ENDOSCOPY;  Service: Endoscopy;  Laterality: N/A;   ESOPHAGOGASTRODUODENOSCOPY (EGD) WITH PROPOFOL  N/A 03/19/2021   Procedure: ESOPHAGOGASTRODUODENOSCOPY (EGD) WITH PROPOFOL ;  Surgeon: Unk Corinn Skiff, MD;  Location: [facility] ENDOSCOPY;  Service: Gastroenterology;  Laterality: N/A;   ICD IMPLANT N/A 05/14/2021   Procedure: ICD IMPLANT;  Surgeon: Cindie Ole DASEN, MD;  Location: [facility];  Service: Cardiovascular;  Laterality: N/A;   IR CT HEAD LTD  01/30/2021   IR PERCUTANEOUS ART THROMBECTOMY/INFUSION INTRACRANIAL INC DIAG ANGIO  01/30/2021   LEFT HEART CATH AND CORONARY ANGIOGRAPHY N/A 01/28/2021    Procedure: LEFT HEART CATH AND CORONARY ANGIOGRAPHY;  Surgeon: Mady Bruckner, MD;  Location: [facility] INVASIVE CV LAB;  Service: Cardiovascular;  Laterality: N/A;   RADIOLOGY WITH ANESTHESIA N/A 01/30/2021   Procedure: IR WITH ANESTHESIA;  Surgeon: Dolphus Carrion, MD;  Location: [facility] OR;  Service: Radiology;  Laterality: N/A;   TONSILLECTOMY AND ADENOIDECTOMY     TRANSCAROTID ARTERY REVASCULARIZATION  Left 02/07/2021   Procedure: TRANSCAROTID ARTERY REVASCULARIZATION LEFT;  Surgeon: Serene Gaile ORN, MD;  Location: [facility] OR;  Service: Vascular;  Laterality: Left;     Current Outpatient Medications  Medication Sig Dispense Refill   amiodarone  (PACERONE ) 200 MG tablet TAKE 1 TABLET BY MOUTH TWICE A DAY 180 tablet 3   carvedilol  (COREG ) 12.5 MG tablet TAKE 1 TABLET TWICE A DAY 60 tablet 0   clopidogrel  (PLAVIX ) 75 MG tablet Take 75 mg by mouth daily.     Coenzyme Q10 (COQ10) 100 MG CAPS Take 1 capsule by mouth every evening.     ENTRESTO  49-51 MG TAKE 1 TABLET BY MOUTH TWICE A DAY 180 tablet 1  FARXIGA  10 MG TABS tablet Take 1 tablet (10 mg total) by mouth daily. 90 tablet 2   levothyroxine  (SYNTHROID ) 75 MCG tablet Take 75 mcg by mouth daily before breakfast.     mexiletine (MEXITIL) 150 MG capsule TAKE 1 CAPSULE BY MOUTH TWICE A DAY 180 capsule 2   rosuvastatin  (CRESTOR ) 5 MG tablet Take 1 tablet (5 mg total) by mouth daily. 90 tablet 3   vitamin C (ASCORBIC ACID) 500 MG tablet Take 500 mg by mouth daily.     No current facility-administered medications for this visit.    Allergies:   Crestor  [rosuvastatin ], Livalo [pitavastatin], Pravachol [pravastatin sodium], and Zocor [simvastatin]    Social History:  The patient  reports that he quit smoking about 21 years ago. His smoking use included cigarettes. He started smoking about 61 years ago. He has a 80 pack-year smoking history. He has never used smokeless tobacco. He reports current alcohol use of about 3.0 standard drinks of alcohol per  week. He reports that he does not use drugs.   Family History:  The patient's family history includes Cancer (age of onset: 61) in his mother; Heart attack in his father; Hypertension in his brother and sister.    ROS:  Please see the history of present illness.   Otherwise, review of systems are positive for none.   All other systems are reviewed and negative.    PHYSICAL EXAM: VS:  BP 128/64 (BP Location: Left Arm, Patient Position: Sitting, Cuff Size: Normal)   Pulse (!) 53   Ht 5' 11 (1.803 m)   Wt 181 lb 8 oz (82.3 kg)   SpO2 98%   BMI 25.31 kg/m  , BMI Body mass index is 25.31 kg/m. GEN: Well nourished, well developed, in no acute distress  HEENT: normal  Neck: no JVD, carotid bruits, or masses Cardiac: RRR; no murmurs, rubs, or gallops,no edema  Respiratory:  clear to auscultation bilaterally, normal work of breathing GI: soft, nontender, nondistended, + BS MS: no deformity or atrophy  Skin: warm and dry, no rash Neuro:  Strength and sensation are intact Psych: euthymic mood, full affect Radial pulses normal bilaterally.  Ulnar pulses slightly diminished bilaterally.   EKG:  EKG is ordered today. The ekg ordered today demonstrates: Sinus bradycardia with 1st degree A-V block Non-specific intra-ventricular conduction block Minimal voltage criteria for LVH, may be normal variant ( Cornell product ) T wave abnormality, consider lateral ischemia    Recent Labs: No results found for requested labs within last 365 days.    Lipid Panel    Component Value Date/Time   CHOL 119 01/30/2021 0530   CHOL 121 09/13/2012 0819   TRIG 85 01/30/2021 0530   HDL 38 (L) 01/30/2021 0530   HDL 37 (L) 09/13/2012 0819   CHOLHDL 3.1 01/30/2021 0530   VLDL 17 01/30/2021 0530   LDLCALC 64 01/30/2021 0530   LDLCALC 68 09/13/2012 0819      Wt Readings from Last 3 Encounters:  04/13/24 181 lb 8 oz (82.3 kg)  02/02/24 185 lb 3.2 oz (84 kg)  03/19/23 180 lb 6 oz (81.8 kg)         ASSESSMENT AND PLAN:  1.  Coronary artery disease involving native coronary arteries without angina: He is doing well with no anginal symptoms.  Continue Plavix  without aspirin .  Other than bruising, no bleeding issues at this time.  2. Chronic systolic heart failure: Mildly reduced ejection fraction due to ischemic cardiomyopathy.  Currently [place]   heart association class II.  He is on optimal medical therapy.  He appears to be euvolemic without a diuretic.  Continue carvedilol , Farxiga  and Entresto .  3. Abdominal aortic aneurysm:  Status post  endovascular repair.   4. Hyperlipidemia: He has known intolerance to statins but has been able to tolerate small dose rosuvastatin .  I reviewed most recent lipid profile done in April which showed an LDL of 54.  5. Essential hypertension: Blood pressure is under excellent control.  6.  Sustained ventricular tachycardia: Status post ICD placement.  Currently controlled with amiodarone  and mexiletine. Followed by Dr. Cindie.  7.  Carotid artery disease status post left ICA stent.  He is on Plavix .  Most recent carotid duplex in April 2024 showed no significant restenosis.   Disposition:   FU with me in 9 months.   Signed,  Deatrice Cage, MD  04/13/2024 9:05 AM    [facility]

## 2024-04-13 NOTE — Patient Instructions (Signed)
 Medication Instructions:  No changes *If you need a refill on your cardiac medications before your next appointment, please call your pharmacy*  Lab Work: None ordered If you have labs (blood work) drawn today and your tests are completely normal, you will receive your results only by: MyChart Message (if you have MyChart) OR A paper copy in the mail If you have any lab test that is abnormal or we need to change your treatment, we will call you to review the results.  Testing/Procedures: None ordered  Follow-Up: At [facility], you and your health needs are our priority.  As part of our continuing mission to provide you with exceptional heart care, our providers are all part of one team.  This team includes your primary Cardiologist (physician) and Advanced Practice Providers or APPs (Physician Assistants and Nurse Practitioners) who all work together to provide you with the care you need, when you need it.  Your next appointment:   9 month(s)  Provider:   You may see Antionette Kirks, MD or one of the following Advanced Practice Providers on your designated Care Team:   Laneta Pintos, NP Gildardo Labrador, PA-C Varney Gentleman, PA-C Cadence La Feria, PA-C Ronald Cockayne, NP Morey Ar, NP    We recommend signing up for the patient portal called "MyChart".  Sign up information is provided on this After Visit Summary.  MyChart is used to connect with patients for Virtual Visits (Telemedicine).  Patients are able to view lab/test results, encounter notes, upcoming appointments, etc.  Non-urgent messages can be sent to your provider as well.   To learn more about what you can do with MyChart, go to [email or web address].

## 2024-05-09 ENCOUNTER — Other Ambulatory Visit: Payer: Self-pay | Admitting: Cardiovascular Disease

## 2024-05-10 ENCOUNTER — Ambulatory Visit (INDEPENDENT_AMBULATORY_CARE_PROVIDER_SITE_OTHER)

## 2024-05-10 DIAGNOSIS — I255 Ischemic cardiomyopathy: Secondary | ICD-10-CM | POA: Diagnosis not present

## 2024-05-11 LAB — CUP PACEART REMOTE DEVICE CHECK
Date Time Interrogation Session: 20250806093642
Implantable Lead Connection Status: 753985
Implantable Lead Implant Date: 20220810
Implantable Lead Location: 753860
Implantable Lead Model: 436909
Implantable Lead Serial Number: 81459128
Implantable Pulse Generator Implant Date: 20220810
Pulse Gen Model: 429525
Pulse Gen Serial Number: 84855891

## 2024-05-12 ENCOUNTER — Ambulatory Visit: Payer: Self-pay | Admitting: Cardiology

## 2024-07-03 NOTE — Progress Notes (Signed)
 Remote ICD Transmission

## 2024-07-24 ENCOUNTER — Encounter: Payer: Self-pay | Admitting: Cardiology

## 2024-07-24 ENCOUNTER — Ambulatory Visit: Attending: Cardiology | Admitting: Cardiology

## 2024-07-24 VITALS — BP 130/68 | HR 51 | Wt 174.0 lb

## 2024-07-24 DIAGNOSIS — Z79899 Other long term (current) drug therapy: Secondary | ICD-10-CM | POA: Diagnosis present

## 2024-07-24 DIAGNOSIS — Z5181 Encounter for therapeutic drug level monitoring: Secondary | ICD-10-CM | POA: Insufficient documentation

## 2024-07-24 DIAGNOSIS — I472 Ventricular tachycardia, unspecified: Secondary | ICD-10-CM | POA: Diagnosis not present

## 2024-07-24 DIAGNOSIS — I5020 Unspecified systolic (congestive) heart failure: Secondary | ICD-10-CM | POA: Insufficient documentation

## 2024-07-24 DIAGNOSIS — Z95810 Presence of automatic (implantable) cardiac defibrillator: Secondary | ICD-10-CM | POA: Insufficient documentation

## 2024-07-24 NOTE — Progress Notes (Signed)
 Electrophysiology Clinic Note    Date:  07/24/2024  Patient ID:  Garrett Hunter, Garrett Hunter 1946/10/02, MRN [id number] PCP:  Diedra Lame, MD  Cardiologist:  Deatrice Cage, MD  Electrophysiologist:  OLE ONEIDA HOLTS, MD     Discussed the use of AI scribe software for clinical note transcription with the patient, who gave verbal consent to proceed.   Patient Profile    Chief Complaint: VT, amiodarone  follow-up  History of Present Illness: Garrett Hunter is a 78 y.o. male with PMH notable for ICM, VT s/p ICD, HFmrEF, HTN, AAA, acquired hypothyroid d/t amiodarone , CVA ; seen today for OLE ONEIDA HOLTS, MD for routine electrophysiology followup.   He last saw Dr. HOLTS 01/2024 at which time he was doing well.    On follow-up today, He experiences no issues with the defibrillator, including no pain or tenderness at the site. He denies chest pain, chest pressure, palpitations, or shortness of breath.  He takes amiodarone  without issues. He is concerned about the cost of Entresto  and Farxiga , as they cost about $700 for a 63-month supply.     Arrhythmia/Device History Bio single chamber ICD, imp 05/2021; dx VT, secondary prevention - H/o appropriate ATP for VT     AAD History: Amiodarone  Mexiletine      ROS:  Please see the history of present illness. All other systems are reviewed and otherwise negative.    Physical Exam    VS:  BP 130/68 (BP Location: Left Arm, Patient Position: Sitting, Cuff Size: Normal)   Pulse (!) 51   Wt 174 lb (78.9 kg)   SpO2 99%   BMI 24.27 kg/m  BMI: Body mass index is 24.27 kg/m.           Wt Readings from Last 3 Encounters:  07/24/24 174 lb (78.9 kg)  04/13/24 181 lb 8 oz (82.3 kg)  02/02/24 185 lb 3.2 oz (84 kg)     GEN- The patient is well appearing, alert and oriented x 3 today.   Lungs- Clear to ausculation bilaterally, normal work of breathing.  Heart- Regular rate and rhythm, no murmurs, rubs or  gallops Extremities- No peripheral edema, warm, dry Skin-  device pocket well-healed, no tethering   Brief check performed without iterative lead testing/measurements No VT episodes     Studies Reviewed   Previous EP, cardiology notes.    EKG is ordered. Personal review of EKG from today shows:    EKG Interpretation Date/Time:  Monday July 24 2024 13:09:35 EDT Ventricular Rate:  51 PR Interval:  256 QRS Duration:  146 QT Interval:  532 QTC Calculation: 490 R Axis:   -16  Text Interpretation: Sinus bradycardia with 1st degree A-V block Left bundle branch block Confirmed by Vickie Melnik [phone]) on 07/24/2024 1:13:37 PM     LHC, 01/28/2021 Conclusions: Severe two-vessel coronary artery disease with chronic total occlusions of the proximal LCx and RCA.  There are minimal luminal irregularities in the LAD without significant stenosis. Widely patent ramus intermedius stent. Moderately elevated left ventricular filling pressure. Recommendations: Continue IV amiodarone  and ICU monitoring in the setting of sustained ventricular tachycardia that is likely scar mediated. Gentle diuresis. Aggressive secondary prevention of coronary artery disease. EP consultation for further management of VT.   TTE, 01/28/2021  1. Left ventricular ejection fraction, by estimation, is 45 to 50%. The left ventricle has mildly decreased function. The left ventricle demonstrates global hypokinesis. There is mild left ventricular hypertrophy. Left ventricular diastolic parameters are  consistent with Grade II diastolic dysfunction (pseudonormalization). There is akinesis of the left ventricular, basal inferior segment.   2. Right ventricular systolic function is normal. The right ventricular size is normal.   3. Left atrial size was mildly dilated.   4. The mitral valve is grossly normal. Trivial mitral valve regurgitation. No evidence of mitral stenosis.   5. The aortic valve was not well visualized.  Aortic valve regurgitation is not visualized. No aortic stenosis is present.   6. Aortic dilatation noted. There is mild dilatation of the aortic root, measuring 39 mm.     Assessment and Plan     #) VT #) amiodarone  monitoring VT remains quiescent on amiodarone  Will continue 200mg  daily He is having lab work at PCP office later this week, will request TSH, T4, and LFTs be included and will follow results   #) ICD in situ Battery good Recent remote monitoring with stable lead measurements Low VP   #) HFmrEF Appears warm and euvolemic on exam Consider insurance adjustments during open enrollment to help with affordability of entresto  and farxiga , though these are affordable at current cost        Current medicines are reviewed at length with the patient today.   The patient does not have concerns regarding his medicines.  The following changes were made today:  none  Labs/ tests ordered today include:  Orders Placed This Encounter  Procedures   EKG 12-Lead     Disposition: Follow up with Dr. Cindie or EP APP in 6 months    Signed, Kimari Coudriet, NP  07/24/24  2:05 PM  Electrophysiology [facility]

## 2024-07-24 NOTE — Patient Instructions (Signed)
 Medication Instructions:  Your physician recommends that you continue on your current medications as directed. Please refer to the Current Medication list given to you today.  *If you need a refill on your cardiac medications before your next appointment, please call your pharmacy*  Lab Work: Your provider would like for you to have following labs drawn  LFTS, TSH, T4.   If you have labs (blood work) drawn today and your tests are completely normal, you will receive your results only by: MyChart Message (if you have MyChart) OR A paper copy in the mail If you have any lab test that is abnormal or we need to change your treatment, we will call you to review the results.  Testing/Procedures: No test ordered today   Follow-Up: At [facility], you and your health needs are our priority.  As part of our continuing mission to provide you with exceptional heart care, our providers are all part of one team.  This team includes your primary Cardiologist (physician) and Advanced Practice Providers or APPs (Physician Assistants and Nurse Practitioners) who all work together to provide you with the care you need, when you need it.  Your next appointment:   6 month(s)  Provider:   Suzann Riddle, NP    We recommend signing up for the patient portal called MyChart.  Sign up information is provided on this After Visit Summary.  MyChart is used to connect with patients for Virtual Visits (Telemedicine).  Patients are able to view lab/test results, encounter notes, upcoming appointments, etc.  Non-urgent messages can be sent to your provider as well.   To learn more about what you can do with MyChart, go to [email or web address].

## 2024-08-09 ENCOUNTER — Ambulatory Visit (INDEPENDENT_AMBULATORY_CARE_PROVIDER_SITE_OTHER)

## 2024-08-09 DIAGNOSIS — I472 Ventricular tachycardia, unspecified: Secondary | ICD-10-CM

## 2024-08-10 LAB — CUP PACEART REMOTE DEVICE CHECK
Date Time Interrogation Session: 20251105095052
Implantable Lead Connection Status: 753985
Implantable Lead Implant Date: 20220810
Implantable Lead Location: 753860
Implantable Lead Model: 436909
Implantable Lead Serial Number: 81459128
Implantable Pulse Generator Implant Date: 20220810
Pulse Gen Model: 429525
Pulse Gen Serial Number: 84855891

## 2024-08-10 NOTE — Progress Notes (Signed)
 Remote ICD Transmission

## 2024-08-14 ENCOUNTER — Ambulatory Visit: Payer: Self-pay | Admitting: Cardiology

## 2024-08-22 LAB — COLOGUARD: COLOGUARD: POSITIVE — AB

## 2024-09-01 ENCOUNTER — Other Ambulatory Visit: Payer: Self-pay | Admitting: Cardiology

## 2024-09-01 DIAGNOSIS — I5020 Unspecified systolic (congestive) heart failure: Secondary | ICD-10-CM

## 2024-10-13 ENCOUNTER — Other Ambulatory Visit: Payer: Self-pay | Admitting: Cardiovascular Disease

## 2024-11-08 ENCOUNTER — Ambulatory Visit

## 2024-11-09 LAB — CUP PACEART REMOTE DEVICE CHECK
Date Time Interrogation Session: 20260204094722
Implantable Lead Connection Status: 753985
Implantable Lead Implant Date: 20220810
Implantable Lead Location: 753860
Implantable Lead Model: 436909
Implantable Lead Serial Number: 81459128
Implantable Pulse Generator Implant Date: 20220810
Pulse Gen Model: 429525
Pulse Gen Serial Number: 84855891

## 2025-02-07 ENCOUNTER — Encounter

## 2025-05-09 ENCOUNTER — Encounter
